# Patient Record
Sex: Male | Born: 1955 | Race: Black or African American | Hispanic: No | Marital: Married | Smoking: Never smoker
Health system: Southern US, Community
[De-identification: ages and names within clinical notes are randomized; demographics above are authoritative.]

## PROBLEM LIST (undated history)

## (undated) DIAGNOSIS — M952 Other acquired deformity of head: Secondary | ICD-10-CM

## (undated) DIAGNOSIS — I10 Essential (primary) hypertension: Principal | ICD-10-CM

## (undated) DIAGNOSIS — R569 Unspecified convulsions: Secondary | ICD-10-CM

## (undated) DIAGNOSIS — I639 Cerebral infarction, unspecified: Secondary | ICD-10-CM

## (undated) DIAGNOSIS — T884XXA Failed or difficult intubation, initial encounter: Secondary | ICD-10-CM

## (undated) DIAGNOSIS — R269 Unspecified abnormalities of gait and mobility: Secondary | ICD-10-CM

## (undated) DIAGNOSIS — M8938 Hypertrophy of bone, other site: Secondary | ICD-10-CM

## (undated) DIAGNOSIS — I169 Hypertensive crisis, unspecified: Secondary | ICD-10-CM

## (undated) DIAGNOSIS — I2510 Atherosclerotic heart disease of native coronary artery without angina pectoris: Secondary | ICD-10-CM

## (undated) DIAGNOSIS — F411 Generalized anxiety disorder: Secondary | ICD-10-CM

## (undated) DIAGNOSIS — J15211 Pneumonia due to Methicillin susceptible Staphylococcus aureus: Secondary | ICD-10-CM

## (undated) HISTORY — DX: Other acquired deformity of head: M95.2

## (undated) HISTORY — DX: Essential (primary) hypertension: I10

## (undated) HISTORY — DX: Cerebral infarction, unspecified: I63.9

## (undated) HISTORY — DX: Unspecified convulsions: R56.9

## (undated) HISTORY — DX: Pneumonia due to methicillin susceptible Staphylococcus aureus: J15.211

## (undated) HISTORY — DX: Hypertrophy of bone, other site: M89.38

## (undated) HISTORY — DX: Generalized anxiety disorder: F41.1

## (undated) HISTORY — DX: Hypertensive crisis, unspecified: I16.9

## (undated) HISTORY — PX: CARDIAC SURGERY: SHX584

## (undated) HISTORY — DX: Unspecified abnormalities of gait and mobility: R26.9

## (undated) HISTORY — PX: CORONARY STENT PLACEMENT: SHX1402

---

## 2000-02-18 ENCOUNTER — Encounter: Admission: RE | Admit: 2000-02-18 | Discharge: 2000-02-18 | Payer: Self-pay | Admitting: General Practice

## 2000-02-18 ENCOUNTER — Encounter: Payer: Self-pay | Admitting: General Practice

## 2000-02-24 ENCOUNTER — Encounter: Payer: Self-pay | Admitting: Neurosurgery

## 2000-02-24 ENCOUNTER — Ambulatory Visit (HOSPITAL_COMMUNITY): Admission: RE | Admit: 2000-02-24 | Discharge: 2000-02-24 | Payer: Self-pay | Admitting: Neurosurgery

## 2000-06-17 ENCOUNTER — Encounter: Payer: Self-pay | Admitting: Emergency Medicine

## 2000-06-17 ENCOUNTER — Inpatient Hospital Stay (HOSPITAL_COMMUNITY): Admission: EM | Admit: 2000-06-17 | Discharge: 2000-06-22 | Payer: Self-pay | Admitting: Emergency Medicine

## 2000-07-04 ENCOUNTER — Encounter (HOSPITAL_COMMUNITY): Admission: RE | Admit: 2000-07-04 | Discharge: 2000-07-15 | Payer: Self-pay | Admitting: Cardiology

## 2000-07-17 ENCOUNTER — Encounter (HOSPITAL_COMMUNITY): Admission: RE | Admit: 2000-07-17 | Discharge: 2000-10-15 | Payer: Self-pay | Admitting: Cardiology

## 2000-09-15 ENCOUNTER — Encounter: Payer: Self-pay | Admitting: Cardiology

## 2001-05-22 ENCOUNTER — Ambulatory Visit (HOSPITAL_COMMUNITY): Admission: RE | Admit: 2001-05-22 | Discharge: 2001-05-23 | Payer: Self-pay | Admitting: Cardiology

## 2004-04-09 ENCOUNTER — Ambulatory Visit (HOSPITAL_COMMUNITY): Admission: RE | Admit: 2004-04-09 | Discharge: 2004-04-09 | Payer: Self-pay

## 2004-04-19 ENCOUNTER — Inpatient Hospital Stay (HOSPITAL_COMMUNITY): Admission: AD | Admit: 2004-04-19 | Discharge: 2004-04-21 | Payer: Self-pay | Admitting: Cardiology

## 2005-01-10 DIAGNOSIS — I639 Cerebral infarction, unspecified: Secondary | ICD-10-CM

## 2005-01-10 HISTORY — DX: Cerebral infarction, unspecified: I63.9

## 2005-11-21 ENCOUNTER — Emergency Department (HOSPITAL_COMMUNITY): Admission: EM | Admit: 2005-11-21 | Discharge: 2005-11-21 | Payer: Self-pay | Admitting: Emergency Medicine

## 2005-12-05 ENCOUNTER — Encounter (INDEPENDENT_AMBULATORY_CARE_PROVIDER_SITE_OTHER): Payer: Self-pay | Admitting: Specialist

## 2005-12-05 ENCOUNTER — Ambulatory Visit (HOSPITAL_BASED_OUTPATIENT_CLINIC_OR_DEPARTMENT_OTHER): Admission: RE | Admit: 2005-12-05 | Discharge: 2005-12-05 | Payer: Self-pay | Admitting: Urology

## 2006-03-06 ENCOUNTER — Inpatient Hospital Stay (HOSPITAL_COMMUNITY): Admission: RE | Admit: 2006-03-06 | Discharge: 2006-03-10 | Payer: Self-pay | Admitting: Urology

## 2006-03-06 ENCOUNTER — Encounter (INDEPENDENT_AMBULATORY_CARE_PROVIDER_SITE_OTHER): Payer: Self-pay | Admitting: *Deleted

## 2006-03-16 ENCOUNTER — Observation Stay (HOSPITAL_COMMUNITY): Admission: EM | Admit: 2006-03-16 | Discharge: 2006-03-17 | Payer: Self-pay | Admitting: Emergency Medicine

## 2006-03-16 ENCOUNTER — Encounter: Payer: Self-pay | Admitting: Emergency Medicine

## 2006-05-01 ENCOUNTER — Ambulatory Visit (HOSPITAL_BASED_OUTPATIENT_CLINIC_OR_DEPARTMENT_OTHER): Admission: RE | Admit: 2006-05-01 | Discharge: 2006-05-01 | Payer: Self-pay | Admitting: Urology

## 2007-05-17 ENCOUNTER — Ambulatory Visit: Payer: Self-pay | Admitting: Cardiology

## 2007-05-17 ENCOUNTER — Inpatient Hospital Stay (HOSPITAL_COMMUNITY): Admission: EM | Admit: 2007-05-17 | Discharge: 2007-05-23 | Payer: Self-pay | Admitting: Emergency Medicine

## 2007-05-18 ENCOUNTER — Encounter (INDEPENDENT_AMBULATORY_CARE_PROVIDER_SITE_OTHER): Payer: Self-pay | Admitting: Pediatrics

## 2007-05-18 ENCOUNTER — Encounter (INDEPENDENT_AMBULATORY_CARE_PROVIDER_SITE_OTHER): Payer: Self-pay | Admitting: Internal Medicine

## 2007-05-22 ENCOUNTER — Ambulatory Visit: Payer: Self-pay | Admitting: Physical Medicine & Rehabilitation

## 2007-05-23 ENCOUNTER — Inpatient Hospital Stay (HOSPITAL_COMMUNITY)
Admission: RE | Admit: 2007-05-23 | Discharge: 2007-06-06 | Payer: Self-pay | Admitting: Physical Medicine & Rehabilitation

## 2007-06-07 ENCOUNTER — Encounter
Admission: RE | Admit: 2007-06-07 | Discharge: 2007-09-05 | Payer: Self-pay | Admitting: Physical Medicine & Rehabilitation

## 2007-07-06 ENCOUNTER — Encounter
Admission: RE | Admit: 2007-07-06 | Discharge: 2007-10-04 | Payer: Self-pay | Admitting: Physical Medicine & Rehabilitation

## 2007-07-10 ENCOUNTER — Ambulatory Visit: Payer: Self-pay | Admitting: Physical Medicine & Rehabilitation

## 2007-08-13 ENCOUNTER — Ambulatory Visit: Payer: Self-pay | Admitting: Physical Medicine & Rehabilitation

## 2007-09-11 ENCOUNTER — Encounter
Admission: RE | Admit: 2007-09-11 | Discharge: 2007-10-01 | Payer: Self-pay | Admitting: Physical Medicine & Rehabilitation

## 2007-09-14 ENCOUNTER — Ambulatory Visit: Payer: Self-pay | Admitting: Physical Medicine & Rehabilitation

## 2010-05-25 NOTE — Procedures (Signed)
NAMEFLEMON, KELTY                   ACCOUNT NO.:  [id number]   MEDICAL RECORD NO.:  [id number]         PATIENT TYPE:  HREC   LOCATION:                                 FACILITY:   PHYSICIAN:  Ranelle Oyster, M.D.DATE OF BIRTH:  30-Dec-1955   DATE OF PROCEDURE:  09/14/2007  DATE OF DISCHARGE:                               OPERATIVE REPORT   DESCRIPTION OF PROCEDURE:  Botox injection for spastic left hemiparesis.  Diagnostic code  342.12.   After informed consent and preparation of the skin with isopropyl  alcohol, we injected the left gastroc at both heads with 100 units of  botulinum toxin A diluted in 1 mL preservative normal saline.  Additionally, we injected the left pectoralis major with approximately  30 units in the left teres major/latissimus dorsi muscles with 70 units  of botulinum toxin A with same dilution ratio.  The areas were localized  with needle EMG to find active area.  The patient tolerated without any  type of ill effect.  He will follow-up with regular exercise of  stretching.   I will see him back in 3 months or so for followup.  He is generally  making nice progress.      Ranelle Oyster, M.D.  Electronically Signed     ZTS/MEDQ  D:  09/14/2007 16:51:22  T:  09/15/2007 05:17:32  Job:  [id number]

## 2010-05-25 NOTE — Assessment & Plan Note (Signed)
[name] is back regarding his right cortical stroke.  Therapy had contacted  me a week or two ago about spasticity in his left upper extremity.  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  He did have a fall about a week and half ago  when he slipped opening a door and fell backwards on his left arm and  back.  Shoulder pain has been worse since then.  Rates pain as 6/10,  describes it as intermittent.  It seems to be easing off a bit.  He uses  Tylenol 1000 mg twice a day for pain control with good results.  He  states as a whole that his blood pressure has been under better control  when he checks it at home.   REVIEW OF SYSTEMS:  Notable for the above.  A full 14-point review is in  the written health history section.   SOCIAL HISTORY:  The patient is married and his wife is supportive and  working.   PHYSICAL EXAMINATION:  VITAL SIGNS:  Blood pressure is 187/81, pulse is  86, and respiratory rate 18.  He is saturating 98% on room air.  GENERAL:  The patient is pleasant, alert and oriented x3.  He walks with  a shuffling gait.  He wears his left AFO for support.  EXTREMITIES:  Left upper extremity really had minimal tone.  I would say  that he had trace tone at the wrist and shoulder.  He did have some  tightness of the left pectoralis major and latissimus dorsi, but it was  minimal.  He seemed to have more pain with rotator cuff maneuvers,  particularly adduction and internal rotation today.  Tone was more  appreciable on the left lower extremity at the gastrocnemius muscle.  I  was able to range him to near neutral with somewhat of a give way right  before he reached that range.  Strength, left upper extremity is 2+ to 3-  out of 5 still throughout.  Reflexes remain hyperactive at  3+; left  lower extremity is 3+ to 4+, left knee and hip; and 1+ to 2 out of 5 at  the ankle now today.  NEUROLOGIC:  Cognitively, he is excellent.  He  continues to have minimal  dysarthria.  He has good insight, awareness, etc.  HEART:  Regular.  CHEST:  Clear.  ABDOMEN:  Soft and nontender.   ASSESSMENT:  1. 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  2. Hypertension.  3. History of renal insufficiency.  4. Left rotator cuff syndrome/capsulitis.   PLAN:  1. I feel that his left upper extremity is more due to rotator cuff      problems.  We therefore injected the left shoulder today via      lateral approach with 40 mg Kenalog and 3 mL 1% lidocaine.  The      patient tolerated well.  I think his range of motion will improve      with better pain control.  2. I do think he could benefit from Botox to the left gastrocnemius      muscle.  We will inject 200 units after approval.  3. Tylenol for pain control.  4. Recommended stretching.  5. The patient assures me blood pressure is much better controlled at      home.  I will take his word for now, but we      will continue  to follow.  6. I will see him back pending Botox schedule.      Ranelle Oyster, M.D.     ZTS/MedQ  D:  08/13/2007 12:10:30  T:  08/14/2007 02:18:10  Job #:  [id number]

## 2010-05-25 NOTE — H&P (Signed)
[name], [name]                   ACCOUNT NO.:  [id number]   MEDICAL RECORD NO.:  [id number]          PATIENT TYPE:  INP   LOCATION:  1427                         [facility]ACILITY[facility]  [facility]   PHYSICIAN:  Ranelle Oyster, M.D.DATE OF BIRTH:  February 02, 1955   DATE OF ADMISSION:  05/17/2007  DATE OF DISCHARGE:  05/23/2007                              HISTORY & PHYSICAL   CHIEF COMPLAINT:  Left-sided weakness.   HISTORY OF PRESENT ILLNESS:  This is a pleasant 55 year old African  American male with history of CAD and hypertension, admitted on May 17, 2007 with left upper extremity and lower extremity weakness.  Admission  blood pressure of 198/118.  MRI and MRA revealed small acute  nonhemorrhagic infarct in the right posterior corona radiata and right  lenticular nucleus.  MRA was without stenosis.  A 2-D echo revealed  ejection fraction 65% with small posterior pericardial fusion.  Neurology was consulted with coagulations panel negative.  Aspirin was  recommended for stroke prophylaxis.  The patient continues to have  significant left-sided weakness affecting his ability to move and  perform his self-care tasks and thus he was admitted to the inpatient  rehab today for ongoing medical and physical management.   REVIEW OF SYSTEMS:  Notable for the above.  His mood is excellent.  Denies any shortness breath or chest pain.  He is moving his bowels and  bladder.  Full reviews as in written H&P.   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   FAMILY HISTORY:  The patient's parents are in the 90s and apparently  have no obvious health problems.  He has healthy siblings.   SOCIAL HISTORY:  The patient is married, lives alone in the house, three  steps to enter.  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 He  does not smoke and occasionally drinks.  His wife is a IV tech here at  the hospital and is very supportive.  She will be taking some time off  with him upon discharge.   FUNCTIONAL HISTORY:  The patient independent prior to arrival.  Currently, he is requiring mod assist for basic mobility and self-care  tasks.   MEDICATIONS:  None.   ALLERGIES:  PENICILLIN, SEPTRA, CHLOROQUINE, ALTACE, and DAPTOMYCIN.   LABORATORY DATA:  Hemoglobin 15.7, white count 6.4, and platelets 146.  Sodium 141, potassium 0.8, BUN 24, and creatinine 1.77.  Homocystine  19.8 and hemoglobin A1c 5.8.   PHYSICAL EXAMINATION:  VITAL SIGNS:  Blood pressure 136/87, pulse 48,  respiratory 80, and temperature 98.5.  GENERAL:  The patient is pleasant, alert, and oriented x3.  She is on  edge of bed.  HEENT:  Pupils equally round and reactive to light.  Ear, nose, and  throat exam is notable for fair to good dentition and pink moist mucosa.  NECK:  Supple without JVD or lymphadenopathy.  CHEST:  Clear to auscultation bilaterally without wheezes,  rales, or  rhonchi.  HEART:  Regular rate and rhythm without murmur, rubs, or gallops.  ABDOMEN:  Soft and nontender.  Bowel sounds are positive.  SKIN:  Generally intact throughout with a few scars noted.  NEUROLOGICALLY:  Cranial nerves II-XII reveal left central VII in tongue  deviation.  Speech is intelligible and generally clear.  Reflexes are 1+  and 2+ on the left.  Sensation is 2/2 in left side today.  Judgment,  orientation, memory, and mood were all within functional limits.  EXTREMITIES:  The patient's strength in the left upper extremity is 1/5  at the deltoid trace at the biceps, triceps, 0 to trace at the finger  and wrist on the left.  Left lower extremity is 1-2/5 at the hip flexor  extenders and knee tender flexors today.  He is zero at the left ankle  dorsiflexors/plantar flexors.  Right side is 5/5 throughout.   ASSESSMENT/PLAN:  1.  Functional deficit secondary to right corona radiata and lenticular      nucleus infarct with subsequent left hemiparesis  and dysarthria.      The patient is admitted to a comprehensive inpatient rehab today to      improve upon his functional deficits and to address his medical      needs as described above.  His care cannot be provided a lesser      intensity service.  Therapy will be provided on an average of 3      hours per day on a disciplinary basis to include PT, which will      assess and treat for range of motion strengthening, gait, and      transferring appropriate equipments.  The patient will likely      benefit from a left AFO for gait and movement.  OT will assess and      treat for range of motion, strengthening, posture, and appropriate      equipment.  Rehab nurse will follow on 24-hour basis for bowel,      bladder, skin, and medication safety issues.  Speech language      pathology will assess for dysarthria.  Rehab case manager/social      worker will assess for psychosocial needs and discharge planning.      Estimated length of stay is 2-1/2 weeks.  Goals modified      independent.  Prognosis fairly good.  2. Anticoagulation with aspirin for stroke prophylaxis and subcu      Lovenox for DVT prophylaxis.  3. Hypertension:  The patient's blood pressure is better control and      Tenormin and Cozaar as well as Cardizem but likely will need      further titration in medications.  4. Renal insufficiency:  We will follow creatinine.  May need      assessment for hydronephrosis once again.  Observe urine output in      residuals.  5. Coronary artery disease:  Continue aspirin, Tenormin, Cardizem, and      niacin.      Ranelle Oyster, M.D.  Electronically Signed     ZTS/MEDQ  D:  05/23/2007  T:  05/24/2007  Job:  [id number]

## 2010-05-25 NOTE — Consult Note (Signed)
[name], [name]                   ACCOUNT NO.:  [id number]   MEDICAL RECORD NO.:  [id number]          PATIENT TYPE:  INP   LOCATION:  0111                         FACILITY:  [facility]   PHYSICIAN:  Deanna Artis. Hickling, M.D.DATE OF BIRTH:  [birth year]   DATE OF CONSULTATION:  05/17/2007  DATE OF DISCHARGE:                                 CONSULTATION   (The patient was actually born March 07, 1954.)   CHIEF COMPLAINT:  Right brain stroke, subacute,   HISTORY OF PRESENT ILLNESS:  [name] is a 55 year old married  gentleman from [place] who has lived in this country for over two  decades.  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 He is now attending medical school in  [place]. He has had significant health problems. They are quite distinct  from his other 11 siblings and his parents. Parents who are in their  late 40s and early 39s with good health.  About 5 years ago, he had a  myocardial infarction and required stent placement.  Patient has had  chronic diabetes, hypertension and they have been relatively poorly  controlled despite the fact that he is on medications under the care of  a physician.   The patient has had two prior strokes involving the left median pontine  region and the right body of the caudate.  He says that he had severe  headaches.   He says he had severe headaches 8 years ago and was seen on [address],  was probably in our practice. He was told that he had a scar on  the brain.  He had CT and MRI scan done.  I do not know what other  studies were done and what he was told.   The patient was last known normal around 9:00 p.m. when he went to bed.  He awakened around 1 in the morning and had difficulty ambulating.  He  decided to back to bed and did not present until 10:50 this morning.   The patient had a CT scan that was unremarkable.  Because he had left  hemiparesis,  MRI scan was performed and showed  evidence of acute lesion  with his apex in the caudal, the dorsal putamen and extending in a wedge  shaped fashion to the tail of the caudate on the right.  There is a  small branch occlusion of the right lenticulostriate arteries.   I was asked to see the patient to determine etiology of his stroke and  make recommendations for further workup and treatment.   The patient's risk factors have been already enumerated above.  The  patient has felt generally well.  A 12 system review is negative except  as noted above.   PAST MEDICAL HISTORY:  1. Dyslipidemia (she is currently not taking medications for that).  2. Myocardial infarction.  3. Renal insufficiency.  4. Hypogonadism.  5. He had some form of scar tissue over his kidney that required a      surgical procedure by Dr. Brunilda Payor.   PAST  MEDICAL HISTORY:  1. His only surgical procedures include a laparotomy to deal with the      scar tissue in the kidney.  2. Cardiac stent.  3. He also had a graft for some form of ulcer ulcerative lesion that      did not take well.   SOCIAL HISTORY:  The patient is married.  He lives in [place], but  has been spending at 2 months at a time in [place], this is his first  year of medical school.  He does not use tobacco or drugs.  He drinks  infrequently in a social fashion.   CURRENT MEDICATIONS:  1. Procardia 60 mg daily.  2. Aspirin 81 mg daily.  3. Atenolol 25 mg daily.  4. Cozaar 100 mg daily.   DRUG ALLERGIES:  PENICILLIN, SEPTRA, STREPTOMYCIN.   PHYSICAL EXAMINATION:  Today, the patient is a well-developed, well-  nourished right-handed man in no distress.  Blood pressure initially  198/118. He was treated with antihypertensives, I think labetalol and  blood pressure dropped to the 148/92 range and is currently 184/92.  Resting pulse 57, respirations 18, temperature 97.7, oxygen saturation  97%.  EAR, NOSE AND THROAT:  No infections.  No bruits.  LUNGS:  Clear.  HEART:  No  murmurs.  Pulses normal.  ABDOMEN:  Soft.  Bowel sounds normal.  No hepatosplenomegaly.  EXTREMITIES:  Were normal.  He has a graft on his left shin.  NEUROLOGIC EXAMINATION:  Awake, alert without dysphasia, dyspraxia.  CRANIAL NERVES:  Round reactive pupils.  Visual fields full. Extraocular  movements full and symmetric. He has symmetric facial strength, midline  tongue and uvula.  Air conduction greater than bone conduction  bilaterally.  MOTOR EXAMINATION:  Normal strength except for his left hip flexor which  was 4+.  Fine motor movements were good.  There is no drift.  Sensation  mild peripheral polyneuropathy, good stereognosis.  No hemisensory.  Cerebellar examination with good finger-to-nose, rapid alternating  movements.  GAIT:  Left hemiparetic, deep reflexes were absent.  The patient had  bilateral flexor plantar responses.   IMPRESSION:  1. Acute right basal ganglia infarction involving the putamen and tail      of the caudate. This a small branch occlusion of lenticulostriate.      I believe that is thrombotic, not embolic- 434.01.  2. Two remote strokes involving the right caudate and left paramedian      pontine region.  Also, lacunes.  Risk factors as noted above      including diabetes, hypertension, atherosclerotic cardiovascular      disease and dyslipidemia.   PLAN:  The patient will have morning labs including hemoglobin A1c,  fasting lipid panel, serum homocystine. Should have a coagulation  profile looking at protein S, total and functional protein C total and  functional and antithrombin III G2A 20210 prothrombin mutation  anticardiolipin antibody, factor V Leiden mutation, fasting lipid panel  and lupus anticoagulant.  He also needs to have noninvasive testing, 2-D  echocardiogram, carotid Doppler.  I agree with his treatment that will  consist of aspirin, his antihypertensives medicines and he probably  should have a statin as well.  He is going to have to  decide whether or  not he should return to [place] or remain in this country her care.  He  was so as to leave tomorrow for [place].  This been postponed. With his  ongoing problems with hypertension, he needs careful management and I do  not know if that is going to happen in [place].   I appreciate the opportunity to participate in his care.  He will need  some physical therapy for his gait because he is walking with a limp,  but I do not believe he needs any other therapy at this time. He also  needs a swallow screen to comply with joint commission requirements.  I  appreciate the opportunity.      Deanna Artis. Sharene Skeans, M.D.  Electronically Signed     WHH/MEDQ  D:  05/17/2007  T:  05/17/2007  Job:  [id number]

## 2010-05-25 NOTE — Discharge Summary (Signed)
NAMEBERTRAM, [name]                   ACCOUNT NO.:  [id number]   MEDICAL RECORD NO.:  [id number]          PATIENT TYPE:  IPS   LOCATION:  4009                         FACILITY:  [facility]   PHYSICIAN:  Ranelle Oyster, M.D.DATE OF BIRTH:  09/16/1955   DATE OF ADMISSION:  05/23/2007  DATE OF DISCHARGE:  06/06/2007                               DISCHARGE SUMMARY   DISCHARGE DIAGNOSES:  1. Right cerebrovascular accident with left hemiparesis.  2. Hypertension, better controlled.  3. Renal insufficiency.  4. Bradycardia, asymptomatic.   HISTORY OF PRESENT ILLNESS:  [name] is a 55 year old male with  history of coronary artery disease, hypertension admitted May 17, 2007  with an history of left upper extremity, left lower extremity weakness.  BP at 198/118 at admission.  MRI, MRA of brain showed small acute non  hemorrhagic infarct, posterior right corona radiata to right lenticular  nucleus, mild intracranial atherosclerosis.  MRA of neck and carotids  showed no ICA stenosis.  2D echo showed EF of 65% with small posterior  pericardial effusion.  Neuro was consulted for input and coagulopathy  panel was recommended, this was negative.  Aspirin has been recommended  for CVA prophylaxis.  Currently, the patient continues with left upper  extremity greater than left lower extremity weakness as well as facial  weakness and mild dysarthria.  Therapies are ongoing and the patient is  mod assist for standing balance, able to transfer with increased  weightbearing to left lower extremity.  Rehab consult for further  therapies.   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   ALLERGIES:  PENICILLIN, SEPTRA, STREPTOMYCIN, CHLOROQUINE, and ALTACE.   FAMILY HISTORY:   Negative for any illnesses.   SOCIAL HISTORY:  The patient is married, lives in one-level home with  three steps at entry.  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  Does not use any tobacco, has an occasional  glass of wine.  The patient was independent and was planning on resuming  school prior to CVA.   HOSPITAL COURSE:  [name] was admitted to rehab on May 23, 2007.  The patient had therapies to consist of PT, OT and speech therapy past  admission.  Labs done revealed hemoglobin 14.8, hematocrit 45.1, white  count 4.4, and platelets 168.  Check of lytes revealed sodium 143,  potassium 3.8, chloride 107, CO2 21, BUN at 30 and creatinine 1.83.  The  patient on no new medicines that would be causing any worsening of renal  insufficiency, therefore renal ultrasound was done to rule out  hydronephrosis. The patient with history of worsening renal status in  the past due to this.  Renal ultrasound showed no evidence of  hydronephrosis.  The patient's renal status has been followed along.  He  was encouraged to push p.o. fluids.  The routine checks of lytes done  during  this stay, last on Jun 04, 2007 shows improvement with BUN at 23,  and creatinine at 1.7.  Management of the patient's blood pressure has  been an issue.  Medications have been adjusted.  Catapres was added for  BP control; however, the patient developed a diffuse rash due to this.  Catapres was discontinued with resolution of rash.  Imdur was added  nightly with current blood pressures ranging in 130s-140s systolic, 60s  to 70s diastolic.  Heart rate continues to range from 51-57 range.  The  patient's p.o. intake has been good.  He has been continent of bowel and  bladder.  Last weight is at 75 kg.   The patient continues with left hemiparesis, noted to have improvement  in proximal left upper extremity strength.  Mood has been stable.  He  did report some concerns regarding  lifestyle changes and Dr. Leonides Cave,  neuropsych was consulted for input and followup on adjustment reaction.   During his stay in rehab, [name] has progressed along well.  OT has  been working with the patient in incorporation of left upper extremity  for grooming and dressing as well as Adaptic dressing techniques.  Currently, the patient is able to weight shift on to left lower  extremity and use right upper extremity grossly during grooming tasks.  The patient is at close supervision for static standing balance.  He is  min assist for shower transfers.  Speech therapy has been working with  the patient for mobility.  The patient has an excellent progress  progressing to modified independent for transfer.  Requires min assist  with gait to ambulate 200 feet with AFO and walker.  His  motor control  limits hip, knee flexion in a swing which tends to increased toe catch  despite AFO.  Another strip was added to help with his foot clearance.  The speech therapy has worked with the patient with strategies for mild  dysarthria as well as oro-motor exercises.  Family is to provide 24-hour  supervision past discharge.  The patient to continue with progressive  PT, OT, speech therapy past discharge.  First appointment is set up at  [facility] outpatient rehab to begin on Jun 07, 2007.  On Jun 06, 2007,  the patient is discharged to home.   DISCHARGE MEDICATIONS:  1. Cozaar 100 mg per day.  2. Ecotrin 325 mg a day.  3. Foltx 1 per day.  4. Imdur 60 mg nightly.  5. Niacin 100 mg a day.  6. Atenolol 25 mg per day.  7. Cardizem CD 120 mg a day.  8. Senokot-S one p.o. nightly.   DIET:  Heart healthy.   ACTIVITY LEVEL:  At supervision to intermittent supervision.  No  strenuous activity.  No alcohol, no smoking, and no driving.   FOLLOWUP:  The patient to followup with Dr. Riley Kill on July 10, 2007 at  12:20, followup with Dr. Catha Gosselin and Dr. Sharyn Lull for routine checks.      Greg Cutter, P.A.      Ranelle Oyster, M.D.  Electronically Signed    PP/MEDQ  D:  06/06/2007  T:  06/07/2007  Job:  [id number]   cc:   Eduardo Osier. Sharyn Lull, M.D.  Deanna Artis. Sharene Skeans, M.D.  Dr. Catha Gosselin

## 2010-05-25 NOTE — H&P (Signed)
[name], [name]                   ACCOUNT NO.:  [id number]   MEDICAL RECORD NO.:  [id number]          PATIENT TYPE:  EMS   LOCATION:  ED                           FACILITY:  [facility]   PHYSICIAN:  Herbie Saxon, MDDATE OF BIRTH:  [birth year]   DATE OF ADMISSION:  05/17/2007  DATE OF DISCHARGE:                              HISTORY & PHYSICAL   PRIMARY CARE PHYSICIAN:  Dr. Maude Leriche.   CARDIOLOGIST:  Dr. Sharyn Lull.   CODE STATUS:  Full code.  No designated health care power of attorney.   PRESENTING COMPLAINT:  Left arm weakness, left leg weakness, one day.   HISTORY OF PRESENTING COMPLAINT:  This is a 55 year old African-American  male with a history of coronary artery disease status post stent three  years ago, hyperlipidemia, hypertension, hypogonadism, renal  insufficiency, who was quite well until he slept last night.  He woke up  around 1:00 a.m. with left arm and left leg weakness.  Wife also noticed  some slight right facial asymmetry.  The patient had initial difficulty  walking, left leg was dragging.  He also had lightheadedness.  He denies  any chest pain, palpitation, shortness of breath, cough or seizure  activity.  No loss of consciousness.  The patient's symptoms have mostly  improved since he has arrived at the emergency room at [facility].  On  presentation, his blood pressure was severely elevated at 198/118;  however, the patient claims good medication compliance.  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  He denies any symptoms referable to the genitourinary,  gastrointestinal, psychological, musculoskeletal or integumentary/skin  systems.  The patient is anxious to be discharged as soon as possible.  It was also noticed that he had morbidly elevated glucose.  He has not  been diagnosed as a diabetic prior to this.   PAST MEDICAL HISTORY:  Also includes:  1. Hypogonadism.  2. He had a laparotomy  two years ago for right kidney fibrous tissue.   FAMILY HISTORY:  Noncontributory.   SOCIAL HISTORY:  He is married and has four children.  No history of  tobacco or drug abuse.  Socially drinks alcohol, a glass of wine on  occasion.   MEDICATIONS:  1. Procardia 60 mg daily.  2. Aspirin 81 mg daily.  3. Atenolol 25 mg twice daily.  4. Cozaar 100 mg daily.   ALLERGIES:  1. PENICILLIN.  2. SEPTRA.  3. DAPTOMYCIN.  4. CHLOROQUINE.   REVIEW OF SYSTEMS:  Fourteen systems were reviewed but none positive  other than in history of presenting complaint.   PHYSICAL EXAMINATION:  GENERAL:  He is a middle-aged man.  He is not in  acute respiratory distress.  VITAL SIGNS:  Temperature is 98, pulse is 56, respiratory rate is 24,  blood pressure 148/92.  NEUROLOGIC:  Pupils are equal, reactive to light and accommodation.  Normal visual acuity, normal visual fields.  Cranial nerves II-XII  grossly intact.  There is no facial asymmetry.  There is no dysarthria.  There is no pronator drift.  Power is 5/5 in the right upper and lower  extremity, 4+ left upper and left lower extremity.  Mild ataxic gait.  There is no dysdiadochokinesia.  There is no aphasia.  Sensory system is  normal sensation to pinprick, light touch and proprioception.  Deep  tendon reflexes are 2+ globally.  Babinski bilaterally.  Plantar  flexion.  CHEST:  Clinically clear.  HEART:  Heart sounds 1 and 2, regular rate and rhythm.  ABDOMEN:  Soft and nontender.  No organomegaly.  Old left flank scar.  Bowel sounds are normoactive and inguinal orifices are patent.  PSYCHIATRIC:  He is alert and oriented to time, place and person.  Mood  is stable.  Memory is intact.  No seizure activity noted.   AVAILABLE LABORATORY:  Showed a WBC of 6, hematocrit 47, platelet count  146,000, glucose is 153, sodium 138, potassium 4.3, chloride 103,  bicarbonate 26, BUN 16, creatinine 1.6, PTT 29, PT 11.8, INR 1.9.  Urinalysis is negative.   CT of the brain shows no acute infarct.  MRI of  the brain shows acute infarct in the posterior right corona radiata  extending to the lenticular nucleus.  EKG shows normal sinus rhythm at  60 per minute, left axis deviation, left atrial enlargement, right axis  deviation and left ventricular hypertrophy.   ASSESSMENT:  1. Acute right cerebral CVA (cerebrovascular accident).  2. Hypertension and emerging hypertensive heart disease.  3. Mild bradycardia.  4. New-onset diabetes.  5. Chronic renal insufficiency.  6. Anxiety.  7. Hypogonadism.  8. 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  9. History of coronary artery disease status post stent.  10.Hyperlipidemia.   The patient is to be admitted to the neurologic intensive care unit.  Dr. Sharene Skeans, neurologist, consulted.  He is to be on intravenous fluid  normal saline at 40 mL an hour.  Diet will be heart healthy, 1800-  calorie ADA (American Diabetic Association), low cholesterol.  Activity  bedrest with seizure, fall and aspiration precautions.  We will get  speech therapy, physical therapy and occupational therapy intervention,  evaluation and assistance.  Oxygen 2 to 4 liters via nasal cannula to  keep saturations above 90.  Orthostatic blood pressure q.a.m.  Get  serial cardiac enzymes, EKG (electrocardiogram) q.8 hours x3.  Obtain  his LFTs (liver function tests), ESR (erythrocyte sedimentation rate),  lupus anticoagulant, RPR (Rapid Plasma Reagin), protein C, protein S  assay.  Obtain MRA (magnetic resonance angiography) head and neck.  Hemoccult x2.  Check his hemoglobin A1c, homocysteine, thyroid function  and fasting lipids.  Lovenox 40 mg subcutaneously daily for DVT (deep  venous thrombosis) prophylaxis, _______ aspirin 325 mg daily, Ambien 5  mg nightly for insomnia, Protonix 40 mg IV daily, albuterol and Atrovent  1 unit dose q.6 hours p.r.n., sliding scale insulin coverage with  NovoLog, _______,  Lantus insulin 4 units subcutaneously nightly.  Continue with his home medications of Procardia 60 mg daily, atenolol 25  mg b.i.d., Cozaar 100 mg daily.  Hold if blood pressure is less than  120/80.  Clonidine 0.1 mg q.8 hours p.r.n. blood pressure greater than  160/110, Xanax 0.25 mg b.i.d.   The patient's illness, medication, treatment plan were explained to him  and his family.  They verbalized understanding.   CONDITION:  Fair.   PROGNOSIS:  Guarded.   TIME OF ENCOUNTER:  One hour.      Herbie Saxon, MD  Electronically Signed     MIO/MEDQ  D:  05/17/2007  T:  05/17/2007  Job:  [id number]

## 2010-05-25 NOTE — Discharge Summary (Signed)
[name], [name]                   ACCOUNT NO.:  [id number]   MEDICAL RECORD NO.:  [id number]          PATIENT TYPE:  INP   LOCATION:  1427                         FACILITY:  [facility]   PHYSICIAN:  Herbie Saxon, MDDATE OF BIRTH:  07/10/1955   DATE OF ADMISSION:  05/17/2007  DATE OF DISCHARGE:                               DISCHARGE SUMMARY   INTERIM SUMMARY   CONSULTATIONS:  Deanna Artis. Sharene Skeans, M.D., neurology.   PRIMARY CARE PHYSICIAN:   CARDIOLOGIST:  Mohan N. Sharyn Lull, M.D.   RADIOLOGY:  The CT head of May 17, 2007 shows no clear evidence of acute  infarction.  MRI bran of May 17, 2007 shows acute small nonhemorrhagic  infarct posterior right corona radiata extending into the posterior  superior aspect of the right lenticular nucleus.  MRA head and neck May 17, 2007 shows intracranial arteriosclerotic changes, precavernous  narrowing of the right internal carotid artery.  Mild narrowing of the  proximal vertebral arteries bilaterally.  Repeat CT scan head of May 21, 2007 shows acute infarction involving the right lateral periventricular  deep white matter, remote right posterior frontal infarction, no  hemorrhage.   HOSPITAL COURSE:  This 55 year old African-American male presented with  left arm weakness, left leg weakness of one day duration with light  headedness, no seizure or syncope.  He is status post MI with stent  placed three years prior.  He has a history hyperlipidemia,  hypertension, renal insufficiency, hypogonadism.  On presentation he had  left hemiparesis.  His blood pressure was mildly elevated at 148/92.  The patient was having difficulty ambulating and had been complaining of  previous headaches.  Dr. Sharene Skeans, neurologist was consulted and agreed  with the diagnosis of acute right basal ganglia cerebrovascular accident  which is thrombotic and recommended coagulopathy work up, counseled the  patient on the need to postpone going back to medical  school in [place]  for now.  The patient was started on gentle blood pressure control, IV  fluid hydration, renal scale sliding scale insulin, NovoLog coverage for  his borderline hyperglycemia.  The patient was not exactly compliant  with medical plans.  He was initially refusing Accu-Chek's and insulin  treatment.  However, he has been counseled extensively on the need for  tight control of glucose and monitoring of his renal status.  His lipid  panel did show  he has hypertriglyceridemia. He has been started on low  dose Niacin.  His rhabdomyolysis is improving with IV fluid hydration  and creatinine has dropped down from 1.9 to 1.7.  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  His  2-D  echocardiogram shows ejection fraction of 65% with mild pericardial  effusion.  The patient has been started on gentle physical therapy,  however, it was noticed that his left upper extremity paralysis is not  improving and there is dense HEMIPLEGIA in the left lower extremity  POWER<3/5.  His hemoglobin A1C, however, is 5.8.   DISCHARGE MEDICATIONS:  Discharge medications will be dictated on the  final  discharge.   PHYSICAL EXAMINATION:  GENERAL:  On examination today he is a middle-  aged man that is in no acute distress.  VITAL SIGNS:  Temperature 98, pulse 51, respiratory rate 20, blood  pressure 144/87.  HEENT: Pupils equal, round, reactive to light and accommodation.  He has  left facial palsy.  Oropharynx and nasopharynx are clear.  Mucous  membranes are moist.  NECK:  Supple.  No carotid bruit or thyromegaly.  No elevated JVD.  CHEST:  Is clinically clear.  HEART:  S1 and S2, regular rate and rhythm.  ABDOMEN: Benign.  Inguinal orifices are patent.  MUSCULOSKELETAL:  Power is 1/5 in the left upper extremity, 3/5 in the  left lower extremity, 5/5 right upper extremity and right lower  extremity.  Deep tendon reflexes are reduced, more marked in the  left  upper extremity with left hypotonia.  Babinski is upgoing left foot with  cranial nerves, except for cranial nerves VII, intact.   LABORATORY DATA:  The available laboratory work today shows a BUN of 24,  creatinine is 1.7.  Sodium 141, potassium 3.8, chloride 109, glucose  104, bicarbonate 24.  Factor V Leiden is negative.  Protein-C function  elevated at 177.  Homocysteine is 19.8. White blood cells 6, hematocrit  14, platelet count 167,000.  Lupus anticoagulant is negative.   RECOMMENDATIONS:  After neurological clearance, the patient is to be  discharged skilled nursing facility rehabilitation in the next 48 to 72  hours, following neurology clearance.  His __________ control is  improved but hold beta blocker if heart rate is less than 55 per minute.      Herbie Saxon, MD  Electronically Signed     MIO/MEDQ  D:  05/22/2007  T:  05/22/2007  Job:  [id number]

## 2010-05-25 NOTE — Assessment & Plan Note (Signed)
[name] is back regarding his right cortical stroke.  Therapy had contacted  me a week or two ago about spasticity in his left upper extremity.  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  He did have a fall about a week and half ago  when he slipped opening a door and fell backwards on his left arm and  back.  Shoulder pain has been worse since then.  Rates pain as 6/10,  describes it as intermittent.  It seems to be easing off a bit.  He uses  Tylenol 1000 mg twice a day for pain control with good results.  He  states as a whole that his blood pressure has been under better control  when he checks it at home.   REVIEW OF SYSTEMS:  Notable for the above.  A full 14-point review is in  the written health history section.   SOCIAL HISTORY:  The patient is married and his wife is supportive and  working.   PHYSICAL EXAMINATION:  VITAL SIGNS:  Blood pressure is 187/81, pulse is  86, and respiratory rate 18.  He is saturating 98% on room air.  GENERAL:  The patient is pleasant, alert and oriented x3.  He walks with  a shuffling gait.  He wears his left AFO for support.  EXTREMITIES:  Left upper extremity really had minimal tone.  I would say  that he had trace tone at the wrist and shoulder.  He did have some  tightness of the left pectoralis major and latissimus dorsi, but it was  minimal.  He seemed to have more pain with rotator cuff maneuvers,  particularly adduction and internal rotation today.  Tone was more  appreciable on the left lower extremity at the gastrocnemius muscle.  I  was able to range him to near neutral with somewhat of a give way right  before he reached that range.  Strength, left upper extremity is 2+ to 3-  out of 5 still throughout.  Reflexes remain hyperactive at  3+; left  lower extremity is 3+ to 4+, left knee and hip; and 1+ to 2 out of 5 at  the ankle now today.  NEUROLOGIC:  Cognitively, he is excellent.  He  continues to have minimal  dysarthria.  He has good insight, awareness, etc.  HEART:  Regular.  CHEST:  Clear.  ABDOMEN:  Soft and nontender.   ASSESSMENT:  1. 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  2. Hypertension.  3. History of renal insufficiency.  4. Left rotator cuff syndrome/capsulitis.   PLAN:  1. I feel that his left upper extremity is more due to rotator cuff      problems.  We therefore injected the left shoulder today via      lateral approach with 40 mg Kenalog and 3 mL 1% lidocaine.  The      patient tolerated well.  I think his range of motion will improve      with better pain control.  2. I do think he could benefit from Botox to the left gastrocnemius      muscle.  We will inject 200 units after approval.  3. Tylenol for pain control.  4. Recommended stretching.  5. The patient assures me blood pressure is much better controlled at      home.  I will take his word for now, but we      will continue  to follow.  6. I will see him back pending Botox schedule.      Ranelle Oyster, M.D.  Electronically Signed     ZTS/MedQ  D:  08/13/2007 12:10:30  T:  08/14/2007 02:18:10  Job #:  [id number]

## 2010-05-25 NOTE — Assessment & Plan Note (Signed)
[name] is back regarding his right cortical stroke.  His stroke  involved the right corona radiata and right lenticular nucleus as well.  [name] has been doing quite well.  He is in outpatient therapy.  His  main complaint is left shoulder pain and continued difficulties using  the left arm.  He reports pain in the shoulder with most range of motion  activities.  He is on outpatient therapy and he is pleased with his  progress.  They are using a BIONEST treatments for the arm and leg.  His  leg seems to be functioning fairly well with his brace.  He experiences  no breakdown.  He is independent with his walking using the AFO.  His  blood pressure has been under better control as of late.  He follows up  with his PCP for this.  He also sees Dr. Sharyn Lull next month.  His  appetite has been good.  He moves his bowel and bladder well.  He is  independent with his bathing, dressing, and mobility.   REVIEW OF SYSTEMS:  Notable for the above.  Also reports some numbness  in the left thumb.   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   PHYSICAL EXAMINATION:  VITAL SIGNS:  Blood pressure is 179/77, pulse 54,  and respiratory rate 18.  He is sating 99% on room air.  GENERAL:  The patient is pleasant, alert, and oriented x3.  The affect  is bright and appropriate.  He walks with a limp using his brace and straight cane.  He does  compensate very well.  The left upper extremity was examined today.  He  has really good muscle strength at 2+ to 3-/5 throughout.  He does have  some apraxia and occasional tone in the wrist, fingers, elbow, and  shoulder.  The reflexes are 3+ throughout.  His shoulder is notable for  rotator cuff impingement signs and mild capsulitis.  Left lower  extremity tone is trace except for the ankle which is perhaps 1/4 with  mild contracture noted.  Strength is 3+ to 4+ left knee and hip today.  Ankle  movement is 1/5.  Cognitively, he is excellent.  He has mild  dysarthria but good mentation, awareness, etc.  HEART:  Regular.  CHEST:  Clear.  ABDOMEN:  Soft, nontender.  He appears to be in good shape physically.   ASSESSMENT:  1. 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  2. Hypertension.  3. History of renal insufficiency.  4. Left rotator cuff syndrome/capsulitis secondary to #1.   PLAN:  1. Continue his outpatient therapies.  He is a great BIONEST candidate      and should continue to improve nicely.  His fluidity of movement      should improve as well with repetition.  2. Left shoulder should improve with range of motion.  3. Recommended Tylenol for now as well for pain.  If he needs      something more, he should call.  4. I think he could benefit from baclofen to help control spasticity.      In general, it is very mild and should be treated with oral      medication and tone management.  5. Blood pressure is improving.  Continue monitoring per PCP.  6. I will see him back in about 6 weeks' time.      Earna Coder  Ermalene Postin, M.D.  Electronically Signed    ZTS/MedQ  D:  07/10/2007 13:15:08  T:  07/11/2007 07:29:51  Job #:  [id number]

## 2010-05-28 NOTE — Op Note (Signed)
[name], [name]                   ACCOUNT NO.:  [id number]   MEDICAL RECORD NO.:  [id number]          PATIENT TYPE:  AMB   LOCATION:  [facility]                         [facility]ACILITY[facility]   PHYSICIAN:  Lindaann Slough, M.D.  DATE OF BIRTH:  Jul 21, 1955   DATE OF PROCEDURE:  05/01/2006  DATE OF DISCHARGE:                               OPERATIVE REPORT   PREOPERATIVE DIAGNOSIS:  Status post ureterolysis,  intraperitonealization of the right ureter and  repair of ureteral  injury on March 06, 2006.   POSTOPERATIVE DIAGNOSIS:  Status post ureterolysis,  intraperitonealization of the right ureter and repair of ureteral injury  on March 06, 2006.   PROCEDURE DONE:  Cystoscopy, right retrograde pyelogram, and removal of  double-J catheter.   SURGEON:  Dr. Brunilda Payor.   ANESTHESIA:  General.   DATE OF PROCEDURE:  May 01, 2006.   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  The patient is now scheduled for cystoscopy,  retrograde pyelogram, and removal of the double-J catheter if the ureter  is normal.   DESCRIPTION OF PROCEDURE:  Under general anesthesia the patient was  prepped and draped and placed in the dorsal lithotomy position. A #22  cystoscope could not be passed through the meatus.  I dilated the meatus  with Sissy Hoff sounds, up to #24-French. Then the 22 cystoscope was  passed in the bladder without difficulty.  The anterior urethra is  normal. He has moderate prostatic hypertrophy.  The bladder is normal.  There is a double-J catheter coming out of the right ureteral orifice  with multiple encrustations on the end of the double-J catheter.  There  is no tumor in the bladder.  The end of the double-J catheter was  grasped with a grasping forceps and pulled through the urethra.  A  sensor  tip guidewire was then passed through the double-J catheter and  the double-J catheter was removed.   Retrograde pyelogram.  A cone-tip catheter was then passed through the cystoscope into the  right ureteral orifice.  Contrast was then injected through the cone-tip  catheter.  The ureter appears normal.  There is no evidence of  extravasation of contrast and there is no evidence of hydroureter, no  evidence of a ureteral stricture.  The calices are moderately dilated.  The cone-tip catheter was then removed.  An open-ended catheter was  passed over the guidewire and the guidewire was removed.  Contrast was  again injected through the open-ended catheter and again there is no  evidence of extravasation of contrast from the ureter and there is no  evidence of stricture.  Urine from the renal pelvis was then obtained  for culture.  Then the open-ended catheter was removed.   The bladder was then irrigated with normal saline and all encrustations  from the ureteral catheter were irrigated out of the bladder.  The  bladder was then emptied and the cystoscope  removed.   The patient tolerated the procedure well and left the OR in satisfactory  condition to post anesthesia care unit.      Lindaann Slough, M.D.  Electronically Signed     MN/MEDQ  D:  05/01/2006  T:  05/01/2006  Job:  [id number]   cc:   Eduardo Osier. Sharyn Lull, M.D.  Fax: [phone]

## 2010-05-28 NOTE — Cardiovascular Report (Signed)
[place] [employer]  Patient:    [name], [name] Visit Number: [id number] MRN: [id number]          Service Type: CAT Location: [id number] Attending Physician:  Robynn Pane Dictated by:   Eduardo Osier Sharyn Lull, M.D. Proc. Date: 05/22/01 Admit Date:  05/22/2001 Discharge Date: 05/23/2001   CC:         Cardiac Catheterization Laboratory  Osvaldo Shipper. Spruill, M.D.   Cardiac Catheterization  PROCEDURES: 1. Left cardiac catheterization with selective left and right coronary    angiography, left ventriculography via right groin using Judkins technique. 2. Successful percutaneous transluminal coronary angioplasty to mid    right coronary artery using 2.5 x 10 mm, long, CrossSail balloon. 3. Successful deployment of 2.5 x 8 mm, long, Cypher drug-eluting stent in    mid right coronary artery.  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 States chest pain relieved with rest. Denies any palpitations, lightheadedness, or syncope. Denies PND, orthopnea, leg swelling, complains of occasional muscle cramps, denies rest or nocturnal angina, and denies fever, chills, or cough. States chest pain feels similar when he had MI.  PAST MEDICAL HISTORY: As above.  PAST SURGICAL HISTORY: None.  ALLERGIES: He is allergic to multiple medications; SULFA, PENICILLIN, STREPTOMYCIN, questionable NORVASC, and questionable ______.  MEDICATIONS AT HOME: 1. Atenolol 25 mg p.o. q.d. 2. Enteric-coated aspirin 325 mg p.o. q.d. 3. Plavix 75 mg p.o. q.d. 4. Altace was recently stopped by the patient. 5. Cozaar 50 mg p.o. q.d. 6. Imdur 60 mg p.o. q.a.m. 7. Zocor 40 mg half p.o.  q.h.s. which was discontinued because of    myalgias.  SOCIAL HISTORY: He is married and has five children. Drinks wine and gin occasionally. No history of smoking. Born in [place], lives in [place] for 19+ years. Works as ______.  FAMILY HISTORY: Noncontributory.  PHYSICAL EXAMINATION:  GENERAL: On examination, he is alert, awake, oriented x3.  VITAL SIGNS: Blood pressure was 170/100, pulse was 62 regular.  HEENT: Conjunctiva pink.  NECK: Supple, no JVD, no bruit.  LUNGS: Lungs are clear to auscultation without rhonchi or rales.  CARDIOVASCULAR: S1 and S2 were normal. There was S4 gallop.  ABDOMEN: Soft, bowel sounds are present, nontender.  EXTREMITIES: There was no clubbing, cyanosis or edema.  IMPRESSION: Accelerated angina, rule out myocardial infarction, uncontrolled hypertension, hypercholesterolemia, myalgias, coronary artery disease, history of non-Q-wave myocardial infarction, status post percutaneous transluminal coronary angioplasty stenting to obtuse marginal #2.  Discussed with patient regarding various options of treatment, i.e., medical and noninvasive stress testing versus invasive, left catheterization, possible percutaneous transluminal coronary angioplasty, stenting, its risk, i.e., death, MI, stroke, need for emergency CABG, risk of restenosis, local vascular complications, etc., and consented for PCI.  DESCRIPTION OF PROCEDURE: After obtaining the informed consent, the patient was brought to the catheterization lab and was placed on the fluoroscopy table.  The right groin was prepped and draped in the usual fashion. Xylocaine 2% was used for local anesthesia in the right groin. With the help of a thin-walled needle, a 6 French arterial sheath was placed. The sheath was aspirated and flushed.  Next, a 6 French left Judkins catheter was advanced over the wire under fluoroscopic guidance up to the ascending  aorta. The wire was pulled out, the  catheter was aspirated and connected to the manifold. The catheter was further advanced and engaged into left coronary ostium. Multiple views of the left system were taken. Next, the catheter was disengaged and was pulled out over the wire and was replaced with a 6 French right Judkins catheter, which was advanced over the wire under fluoroscopic guidance up to the ascending aorta. The wire was pulled out, the catheter was aspirated and connected to the manifold. The catheter was further advanced and engaged into the right coronary ostium. Multiple views of the right system were taken. Next, the catheter was disengaged and was pulled out over the wire and was replaced with a 6 French pigtail catheter, which was advanced over the wire under fluoroscopic guidance up to the ascending aorta. The wire was pulled out, the catheter was aspirated and connected to the manifold. The catheter was further advanced across the aortic valve into the LV. LV pressures were recorded. Next, left ventriculography was done in 30-degree RAO position. Post angiographic pressures were recorded from LV and then pullback pressures were recorded from aorta. There was no gradient across the aortic valve. Next, the pigtail was pulled out over the wire, sheaths were aspirated and flushed.  FINDINGS: LV showed good left ventricular systolic function. EF was 50-55%. Left main was patent.  LAD has 40-50% mid sequential stenosis. Diagonal #1 was patent. Diagonal #2 has 30-40% proximal stenosis.  Left circumflex was patent. OM-1 was very small, which was less than 0.5 mm which was diffusely diseased. OM-2 has 20-30% proximal stenosis. Stented portion has mild in-stent re-stenosis, and distally the vessel is diffusely diseased. OM-3 is very small which is diffusely diseased. OM-4 has 30-40% mid stenosis.   RCA has 30-40% proximal stenosis, 80% focal mid stenosis just before the large acute marginal which has also  80-85% stenosis which is less than 1 mm in size. Distal midportion of the RCA also has 40-50% stenosis beyond the origin of the acute marginal.  INTERVENTIONAL PROCEDURE: Successful PTCA to mid RCA was done using 2.5 x 10 mm, long, CrossSail balloon for pre-dilatation and then 3.5 x 8 mm, long, Cypher drug-eluting stent was deployed at 8 atmospheres of pressure, which was fully expanded going up to 13 atmospheres of pressure. The lesion was dilated from 80% to 0% residual with excellent TIMI grade 3 distal flow without evidence of dissection or distal embolization. The patient received weight-based heparin, ReoPro during the procedure. The patient tolerated the procedure well. There were no complications. The patient was transferred to recovery room in stable condition. Dictated by:   Eduardo Osier Sharyn Lull, M.D. Attending Physician:  Robynn Pane DD:  05/23/01 TD:  05/24/01 Job: [id number]

## 2010-10-06 LAB — COMPREHENSIVE METABOLIC PANEL
Albumin: 3.4 — ABNORMAL LOW
BUN: 30 — ABNORMAL HIGH
Calcium: 9.3
Creatinine, Ser: 1.83 — ABNORMAL HIGH
Total Bilirubin: 0.5
Total Protein: 6.9

## 2010-10-06 LAB — CK TOTAL AND CKMB (NOT AT ARMC)
Relative Index: 0.9
Total CK: 279 — ABNORMAL HIGH

## 2010-10-06 LAB — URINALYSIS, ROUTINE W REFLEX MICROSCOPIC
Bilirubin Urine: NEGATIVE
Glucose, UA: NEGATIVE
Ketones, ur: NEGATIVE
Ketones, ur: NEGATIVE
Leukocytes, UA: NEGATIVE
Nitrite: NEGATIVE
Nitrite: NEGATIVE
Protein, ur: 100 — AB
Protein, ur: NEGATIVE
Specific Gravity, Urine: 1.007
Urobilinogen, UA: 0.2
Urobilinogen, UA: 0.2
pH: 6
pH: 5

## 2010-10-06 LAB — CBC
HCT: 45.1
HCT: 47.6
HCT: 48.3
Hemoglobin: 15.6
Hemoglobin: 15.7
MCHC: 32.8
MCHC: 33
MCV: 79.4
MCV: 80
MCV: 80
Platelets: 146 — ABNORMAL LOW
Platelets: 167
Platelets: 168
RBC: 5.95 — ABNORMAL HIGH
RDW: 14.4
RDW: 15.3
RDW: 15.4
WBC: 6.4

## 2010-10-06 LAB — BASIC METABOLIC PANEL
BUN: 16
BUN: 17
BUN: 24 — ABNORMAL HIGH
BUN: 23
BUN: 25 — ABNORMAL HIGH
CO2: 24
CO2: 26
CO2: 26
CO2: 25
Calcium: 9
Calcium: 9.1
Calcium: 9.1
Chloride: 109
Chloride: 106
Chloride: 107
Creatinine, Ser: 1.74 — ABNORMAL HIGH
Creatinine, Ser: 1.76 — ABNORMAL HIGH
Creatinine, Ser: 1.77 — ABNORMAL HIGH
Creatinine, Ser: 1.59 — ABNORMAL HIGH
Creatinine, Ser: 1.71 — ABNORMAL HIGH
GFR calc Af Amer: 44 — ABNORMAL LOW
GFR calc Af Amer: 50 — ABNORMAL LOW
GFR calc Af Amer: 56 — ABNORMAL LOW
GFR calc non Af Amer: 37 — ABNORMAL LOW
GFR calc non Af Amer: 41 — ABNORMAL LOW
GFR calc non Af Amer: 42 — ABNORMAL LOW
Glucose, Bld: 106 — ABNORMAL HIGH
Potassium: 3.2 — ABNORMAL LOW
Potassium: 4
Potassium: 3.8
Sodium: 141

## 2010-10-06 LAB — TYPE AND SCREEN
ABO/RH(D): B POS
Antibody Screen: NEGATIVE

## 2010-10-06 LAB — PHOSPHORUS: Phosphorus: 3.3

## 2010-10-06 LAB — LUPUS ANTICOAGULANT PANEL: PTT Lupus Anticoagulant: 41.9 (ref 36.3–48.8)

## 2010-10-06 LAB — CARDIAC PANEL(CRET KIN+CKTOT+MB+TROPI)
Relative Index: 1
Total CK: 267 — ABNORMAL HIGH
Troponin I: 0.02

## 2010-10-06 LAB — BUN: BUN: 18

## 2010-10-06 LAB — PROTEIN C ACTIVITY: Protein C Activity: 177 % — ABNORMAL HIGH (ref 75–133)

## 2010-10-06 LAB — URINE MICROSCOPIC-ADD ON

## 2010-10-06 LAB — HEPATIC FUNCTION PANEL
ALT: 18
AST: 25
Bilirubin, Direct: 0.1
Total Bilirubin: 1.2

## 2010-10-06 LAB — TSH: TSH: 1.862

## 2010-10-06 LAB — DIFFERENTIAL
Basophils Absolute: 0
Basophils Absolute: 0.1
Basophils Relative: 1
Eosinophils Absolute: 0.1
Eosinophils Relative: 2
Lymphocytes Relative: 21
Lymphocytes Relative: 45
Lymphs Abs: 1.3
Lymphs Abs: 2
Monocytes Absolute: 0.4
Monocytes Absolute: 0.4
Monocytes Relative: 6
Monocytes Relative: 9
Neutro Abs: 1.7
Neutro Abs: 4.5
Neutrophils Relative %: 70

## 2010-10-06 LAB — CREATININE, SERUM
GFR calc Af Amer: 49 — ABNORMAL LOW
GFR calc non Af Amer: 41 — ABNORMAL LOW

## 2010-10-06 LAB — BASIC METABOLIC PANEL WITH GFR
Calcium: 9.6
Chloride: 103
GFR calc non Af Amer: 41 — ABNORMAL LOW
Glucose, Bld: 153 — ABNORMAL HIGH
Potassium: 4.3
Sodium: 138

## 2010-10-06 LAB — FACTOR 5 LEIDEN

## 2010-10-06 LAB — CK: Total CK: 224

## 2010-10-06 LAB — PROTHROMBIN GENE MUTATION

## 2010-10-06 LAB — LIPID PANEL
Cholesterol: 184
LDL Cholesterol: 115 — ABNORMAL HIGH
Total CHOL/HDL Ratio: 3.1

## 2010-10-06 LAB — APTT: aPTT: 29

## 2010-10-06 LAB — URINE CULTURE: Colony Count: 3000

## 2010-10-06 LAB — PROTIME-INR
INR: 0.9
Prothrombin Time: 11.8

## 2010-10-06 LAB — RPR: RPR Ser Ql: NONREACTIVE

## 2010-10-06 LAB — PROTEIN C, TOTAL: Protein C, Total: 92 % (ref 70–140)

## 2010-10-06 LAB — PROTEIN S, TOTAL: Protein S Ag, Total: 100 % (ref 70–140)

## 2010-10-06 LAB — PROTEIN S ACTIVITY: Protein S Activity: 122 % (ref 69–129)

## 2010-10-06 LAB — TROPONIN I: Troponin I: <0.01

## 2011-03-07 ENCOUNTER — Ambulatory Visit
Admission: RE | Admit: 2011-03-07 | Discharge: 2011-03-07 | Disposition: A | Discharge status: Home / Self Care | Payer: Medicare Other | Source: Ambulatory Visit | Attending: General Practice | Admitting: General Practice

## 2011-03-07 ENCOUNTER — Other Ambulatory Visit: Payer: Self-pay | Admitting: General Practice

## 2011-03-07 DIAGNOSIS — I10 Essential (primary) hypertension: Secondary | ICD-10-CM

## 2012-06-21 ENCOUNTER — Inpatient Hospital Stay (HOSPITAL_COMMUNITY): Payer: Medicare Other

## 2012-06-21 ENCOUNTER — Emergency Department (HOSPITAL_COMMUNITY): Payer: Medicare Other

## 2012-06-21 ENCOUNTER — Inpatient Hospital Stay (HOSPITAL_COMMUNITY)
Admission: EM | Admit: 2012-06-21 | Discharge: 2012-07-17 | DRG: 061 | Disposition: A | Discharge status: Other Acute Inpatient Hospital | Payer: Medicare Other | Attending: Internal Medicine | Admitting: Internal Medicine

## 2012-06-21 ENCOUNTER — Encounter (HOSPITAL_COMMUNITY): Payer: Self-pay | Admitting: Family Medicine

## 2012-06-21 DIAGNOSIS — I609 Nontraumatic subarachnoid hemorrhage, unspecified: Secondary | ICD-10-CM | POA: Diagnosis not present

## 2012-06-21 DIAGNOSIS — R4702 Dysphasia: Secondary | ICD-10-CM

## 2012-06-21 DIAGNOSIS — N289 Disorder of kidney and ureter, unspecified: Secondary | ICD-10-CM

## 2012-06-21 DIAGNOSIS — I10 Essential (primary) hypertension: Secondary | ICD-10-CM

## 2012-06-21 DIAGNOSIS — G936 Cerebral edema: Secondary | ICD-10-CM

## 2012-06-21 DIAGNOSIS — N183 Chronic kidney disease, stage 3 unspecified: Secondary | ICD-10-CM | POA: Diagnosis present

## 2012-06-21 DIAGNOSIS — E876 Hypokalemia: Secondary | ICD-10-CM | POA: Diagnosis not present

## 2012-06-21 DIAGNOSIS — K560 Paralytic ileus: Secondary | ICD-10-CM | POA: Diagnosis not present

## 2012-06-21 DIAGNOSIS — I129 Hypertensive chronic kidney disease with stage 1 through stage 4 chronic kidney disease, or unspecified chronic kidney disease: Secondary | ICD-10-CM | POA: Diagnosis present

## 2012-06-21 DIAGNOSIS — R471 Dysarthria and anarthria: Secondary | ICD-10-CM | POA: Diagnosis present

## 2012-06-21 DIAGNOSIS — I6350 Cerebral infarction due to unspecified occlusion or stenosis of unspecified cerebral artery: Secondary | ICD-10-CM

## 2012-06-21 DIAGNOSIS — D72829 Elevated white blood cell count, unspecified: Secondary | ICD-10-CM | POA: Diagnosis not present

## 2012-06-21 DIAGNOSIS — J690 Pneumonitis due to inhalation of food and vomit: Secondary | ICD-10-CM

## 2012-06-21 DIAGNOSIS — G9340 Encephalopathy, unspecified: Secondary | ICD-10-CM | POA: Diagnosis present

## 2012-06-21 DIAGNOSIS — G8190 Hemiplegia, unspecified affecting unspecified side: Secondary | ICD-10-CM | POA: Diagnosis present

## 2012-06-21 DIAGNOSIS — L89309 Pressure ulcer of unspecified buttock, unspecified stage: Secondary | ICD-10-CM | POA: Diagnosis not present

## 2012-06-21 DIAGNOSIS — R7309 Other abnormal glucose: Secondary | ICD-10-CM | POA: Diagnosis present

## 2012-06-21 DIAGNOSIS — I639 Cerebral infarction, unspecified: Secondary | ICD-10-CM

## 2012-06-21 DIAGNOSIS — I6990 Unspecified sequelae of unspecified cerebrovascular disease: Secondary | ICD-10-CM | POA: Diagnosis present

## 2012-06-21 DIAGNOSIS — R4020 Unspecified coma: Secondary | ICD-10-CM | POA: Diagnosis present

## 2012-06-21 DIAGNOSIS — H5340 Unspecified visual field defects: Secondary | ICD-10-CM | POA: Diagnosis present

## 2012-06-21 DIAGNOSIS — R1310 Dysphagia, unspecified: Secondary | ICD-10-CM | POA: Diagnosis not present

## 2012-06-21 DIAGNOSIS — I219 Acute myocardial infarction, unspecified: Secondary | ICD-10-CM

## 2012-06-21 DIAGNOSIS — R791 Abnormal coagulation profile: Secondary | ICD-10-CM | POA: Diagnosis not present

## 2012-06-21 DIAGNOSIS — E870 Hyperosmolality and hypernatremia: Secondary | ICD-10-CM

## 2012-06-21 DIAGNOSIS — R259 Unspecified abnormal involuntary movements: Secondary | ICD-10-CM

## 2012-06-21 DIAGNOSIS — I619 Nontraumatic intracerebral hemorrhage, unspecified: Secondary | ICD-10-CM

## 2012-06-21 DIAGNOSIS — J15211 Pneumonia due to Methicillin susceptible Staphylococcus aureus: Secondary | ICD-10-CM

## 2012-06-21 DIAGNOSIS — R279 Unspecified lack of coordination: Secondary | ICD-10-CM

## 2012-06-21 DIAGNOSIS — E785 Hyperlipidemia, unspecified: Secondary | ICD-10-CM | POA: Diagnosis present

## 2012-06-21 DIAGNOSIS — I169 Hypertensive crisis, unspecified: Secondary | ICD-10-CM

## 2012-06-21 DIAGNOSIS — J9600 Acute respiratory failure, unspecified whether with hypoxia or hypercapnia: Secondary | ICD-10-CM

## 2012-06-21 DIAGNOSIS — I6340 Cerebral infarction due to embolism of unspecified cerebral artery: Principal | ICD-10-CM | POA: Diagnosis present

## 2012-06-21 DIAGNOSIS — N390 Urinary tract infection, site not specified: Secondary | ICD-10-CM | POA: Diagnosis not present

## 2012-06-21 DIAGNOSIS — R569 Unspecified convulsions: Secondary | ICD-10-CM

## 2012-06-21 DIAGNOSIS — N179 Acute kidney failure, unspecified: Secondary | ICD-10-CM

## 2012-06-21 DIAGNOSIS — B965 Pseudomonas (aeruginosa) (mallei) (pseudomallei) as the cause of diseases classified elsewhere: Secondary | ICD-10-CM | POA: Diagnosis not present

## 2012-06-21 DIAGNOSIS — T45615A Adverse effect of thrombolytic drugs, initial encounter: Secondary | ICD-10-CM | POA: Diagnosis not present

## 2012-06-21 DIAGNOSIS — R509 Fever, unspecified: Secondary | ICD-10-CM

## 2012-06-21 DIAGNOSIS — Z9861 Coronary angioplasty status: Secondary | ICD-10-CM

## 2012-06-21 DIAGNOSIS — T884XXA Failed or difficult intubation, initial encounter: Secondary | ICD-10-CM

## 2012-06-21 DIAGNOSIS — R4701 Aphasia: Secondary | ICD-10-CM | POA: Diagnosis present

## 2012-06-21 DIAGNOSIS — I214 Non-ST elevation (NSTEMI) myocardial infarction: Secondary | ICD-10-CM | POA: Diagnosis not present

## 2012-06-21 DIAGNOSIS — I2510 Atherosclerotic heart disease of native coronary artery without angina pectoris: Secondary | ICD-10-CM | POA: Diagnosis present

## 2012-06-21 DIAGNOSIS — B9689 Other specified bacterial agents as the cause of diseases classified elsewhere: Secondary | ICD-10-CM | POA: Diagnosis not present

## 2012-06-21 DIAGNOSIS — Z8673 Personal history of transient ischemic attack (TIA), and cerebral infarction without residual deficits: Secondary | ICD-10-CM

## 2012-06-21 DIAGNOSIS — L8992 Pressure ulcer of unspecified site, stage 2: Secondary | ICD-10-CM | POA: Diagnosis not present

## 2012-06-21 DIAGNOSIS — Z66 Do not resuscitate: Secondary | ICD-10-CM | POA: Diagnosis not present

## 2012-06-21 DIAGNOSIS — IMO0002 Reserved for concepts with insufficient information to code with codable children: Secondary | ICD-10-CM

## 2012-06-21 DIAGNOSIS — Z79899 Other long term (current) drug therapy: Secondary | ICD-10-CM

## 2012-06-21 DIAGNOSIS — I6930 Unspecified sequelae of cerebral infarction: Secondary | ICD-10-CM | POA: Diagnosis present

## 2012-06-21 DIAGNOSIS — E46 Unspecified protein-calorie malnutrition: Secondary | ICD-10-CM | POA: Diagnosis not present

## 2012-06-21 DIAGNOSIS — R9431 Abnormal electrocardiogram [ECG] [EKG]: Secondary | ICD-10-CM

## 2012-06-21 HISTORY — DX: Failed or difficult intubation, initial encounter: T88.4XXA

## 2012-06-21 HISTORY — DX: Cerebral infarction, unspecified: I63.9

## 2012-06-21 HISTORY — DX: Essential (primary) hypertension: I10

## 2012-06-21 HISTORY — DX: Atherosclerotic heart disease of native coronary artery without angina pectoris: I25.10

## 2012-06-21 LAB — COMPREHENSIVE METABOLIC PANEL
AST: 29 U/L (ref 0–37)
BUN: 24 mg/dL — ABNORMAL HIGH (ref 6–23)
CO2: 26 mEq/L (ref 19–32)
Calcium: 9.3 mg/dL (ref 8.4–10.5)
Creatinine, Ser: 2.26 mg/dL — ABNORMAL HIGH (ref 0.50–1.35)
GFR calc non Af Amer: 30 mL/min — ABNORMAL LOW (ref >90)

## 2012-06-21 LAB — CBC
MCH: 20 pg — ABNORMAL LOW (ref 26.0–34.0)
MCV: 62.4 fL — ABNORMAL LOW (ref 78.0–100.0)
Platelets: 291 10*3/uL (ref 150–400)
RDW: 18.5 % — ABNORMAL HIGH (ref 11.5–15.5)

## 2012-06-21 LAB — POCT I-STAT TROPONIN I: Troponin i, poc: 0.02 ng/mL (ref 0.00–0.08)

## 2012-06-21 LAB — DIFFERENTIAL
Basophils Absolute: 0.1 10*3/uL (ref 0.0–0.1)
Eosinophils Relative: 3 % (ref 0–5)
Lymphs Abs: 4.2 10*3/uL — ABNORMAL HIGH (ref 0.7–4.0)
Monocytes Absolute: 1 10*3/uL (ref 0.1–1.0)

## 2012-06-21 LAB — GLUCOSE, CAPILLARY

## 2012-06-21 LAB — TROPONIN I: Troponin I: <0.3 ng/mL (ref <0.30)

## 2012-06-21 LAB — PROTIME-INR: Prothrombin Time: 12.1 seconds (ref 11.6–15.2)

## 2012-06-21 LAB — MRSA PCR SCREENING: MRSA by PCR: NEGATIVE

## 2012-06-21 MED ORDER — LORAZEPAM 2 MG/ML IJ SOLN
INTRAMUSCULAR | Status: AC
  Filled 2012-06-21: qty 1

## 2012-06-21 MED ORDER — NICARDIPINE HCL IN NACL 20-0.86 MG/200ML-% IV SOLN
5.0000 mg/h | INTRAVENOUS | Status: DC
  Administered 2012-06-21: 5 mg/h via INTRAVENOUS

## 2012-06-21 MED ORDER — ALTEPLASE (STROKE) FULL DOSE INFUSION
0.9000 mg/kg | Freq: Once | INTRAVENOUS | Status: AC
  Administered 2012-06-21: 70 mg via INTRAVENOUS
  Filled 2012-06-21: qty 70

## 2012-06-21 MED ORDER — SENNOSIDES-DOCUSATE SODIUM 8.6-50 MG PO TABS
1.0000 | ORAL_TABLET | Freq: Every evening | ORAL | Status: DC | PRN
  Administered 2012-07-03: 1 via ORAL
  Filled 2012-06-21: qty 1

## 2012-06-21 MED ORDER — LORAZEPAM 2 MG/ML IJ SOLN
2.0000 mg | Freq: Once | INTRAMUSCULAR | Status: AC
  Administered 2012-06-21: 2 mg via INTRAVENOUS

## 2012-06-21 MED ORDER — PANTOPRAZOLE SODIUM 40 MG IV SOLR
40.0000 mg | Freq: Every day | INTRAVENOUS | Status: DC
  Administered 2012-06-21 – 2012-06-29 (×9): 40 mg via INTRAVENOUS
  Filled 2012-06-21 (×10): qty 40

## 2012-06-21 MED ORDER — NICARDIPINE HCL IN NACL 20-0.86 MG/200ML-% IV SOLN
3.0000 mg/h | INTRAVENOUS | Status: DC
  Administered 2012-06-21: 5 mg/h via INTRAVENOUS
  Administered 2012-06-25: 10 mg/h via INTRAVENOUS
  Administered 2012-06-25: 15 mg/h via INTRAVENOUS
  Administered 2012-06-25: 3 mg/h via INTRAVENOUS
  Administered 2012-06-25: 12 mg/h via INTRAVENOUS
  Administered 2012-06-26 (×2): 6 mg/h via INTRAVENOUS
  Administered 2012-06-26: 15 mg/h via INTRAVENOUS
  Administered 2012-06-26: 6 mg/h via INTRAVENOUS
  Administered 2012-06-26: 5 mg/h via INTRAVENOUS
  Administered 2012-06-26: 6 mg/h via INTRAVENOUS
  Filled 2012-06-21 (×12): qty 200

## 2012-06-21 MED ORDER — LABETALOL HCL 5 MG/ML IV SOLN
20.0000 mg | Freq: Once | INTRAVENOUS | Status: AC
  Administered 2012-06-21: 20 mg via INTRAVENOUS

## 2012-06-21 MED ORDER — ACETAMINOPHEN 650 MG RE SUPP
650.0000 mg | RECTAL | Status: DC | PRN
  Administered 2012-06-28: 650 mg via RECTAL
  Filled 2012-06-21: qty 1

## 2012-06-21 MED ORDER — LABETALOL HCL 5 MG/ML IV SOLN
INTRAVENOUS | Status: AC
  Filled 2012-06-21: qty 4

## 2012-06-21 MED ORDER — SODIUM CHLORIDE 0.9 % IV SOLN
INTRAVENOUS | Status: DC
  Administered 2012-06-21: 22:00:00 via INTRAVENOUS

## 2012-06-21 MED ORDER — ACETAMINOPHEN 325 MG PO TABS
650.0000 mg | ORAL_TABLET | ORAL | Status: DC | PRN
  Administered 2012-06-22 – 2012-06-29 (×21): 650 mg via ORAL
  Filled 2012-06-21 (×21): qty 2

## 2012-06-21 NOTE — ED Notes (Signed)
7mg  bolus complete

## 2012-06-21 NOTE — Code Documentation (Signed)
Patient in normal state of health with wife at school program when he developed confusion and aphasia. Code stroke called 1912, patient arrived at 65, [place] at 33, EDP exam at 1928, stroke team arrived at 1925, neurologist arrived at 1918, patient arrived to CT at 1929, patient extremely agitated in CT, orders received for 2mg  ativan given per Dr. Roseanne Reno, phlebotomist arrived at 1925, CT read by neurologist at 1935, pharmacy notified for tPA at 1945, tPA delivered to bedside at 1957. 20mg  IV Labetalol given for SBP 207 at 1948, IV cardene started at 2003 at 5mg . tPA started at 2017. ICU bed requested at 2026. Initial NIH 08

## 2012-06-21 NOTE — H&P (Signed)
Admission H&P    Chief Complaint: Acute onset of confusion and inability to speak.  HPI: [name] is an 57 y.o. 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 No focal weakness was noted. Patient has been taking aspirin 81 mg per day. CT scan showed no acute intracranial abnormality. NIH stroke score was 8. Patient had marked expressive aphasia as well as gaze preference to the left side and right visual field defect to visual confrontation. He was deemed a candidate for intravenous thrombolytic therapy with TPA.   LSN: [id number] on 06/21/2012 tPA Given: Yes MRankin: 2  No past medical history on file.  No past surgical history on file.  No family history on file. Social History:  has no tobacco, alcohol, and drug history on file.  Allergies: Allergies no known allergies   (Not in a hospital admission)  ROS: History obtained from spouse  General ROS: negative for - chills, fatigue, fever, night sweats, weight gain or weight loss Psychological ROS: negative for - behavioral disorder, hallucinations, memory difficulties, mood swings or suicidal ideation Ophthalmic ROS: negative for - blurry vision, double vision, eye pain or loss of vision ENT ROS: negative for - epistaxis, nasal discharge, oral lesions, sore throat, tinnitus or vertigo Allergy and Immunology ROS: negative for - hives or itchy/watery eyes Hematological and Lymphatic ROS: negative for - bleeding problems, bruising or swollen lymph nodes Endocrine ROS: negative for - galactorrhea, hair pattern changes, polydipsia/polyuria or temperature intolerance Respiratory ROS: negative for - cough, hemoptysis, shortness of breath or  wheezing Cardiovascular ROS: negative for - chest pain, dyspnea on exertion, edema or irregular heartbeat Gastrointestinal ROS: negative for - abdominal pain, diarrhea, hematemesis, nausea/vomiting or stool incontinence Genito-Urinary ROS: negative for - dysuria, hematuria, incontinence or urinary frequency/urgency Musculoskeletal ROS: negative for - joint swelling or muscular weakness Neurological ROS: as noted in HPI Dermatological ROS: negative for rash and skin lesion changes  Physical Examination: Blood pressure 207/106, pulse 97, resp. rate 27, weight 77.9 kg (171 lb 11.8 oz), SpO2 98.00%.  HEENT-  Normocephalic, no lesions, without obvious abnormality.  Normal external eye and conjunctiva.  Normal TM's bilaterally.  Normal auditory canals and external ears. Normal external nose, mucus membranes and septum.  Normal pharynx. Neck supple with no masses, nodes, nodules or enlargement. Cardiovascular - regular rate and rhythm, S1, S2 normal, no murmur, click, rub or gallop Lungs - chest clear, no wheezing, rales, normal symmetric air entry, Heart exam - S1, S2 normal, no murmur, no gallop, rate regular Abdomen - soft, non-tender; bowel sounds normal; no masses,  no organomegaly Extremities - no joint deformities, effusion, or inflammation and no edema  Neurologic Examination: Mental Status: Marked expressive aphasia with unintelligible speech output and attendant frustration. Able to follow commands without difficulty, however. Cranial Nerves: II-right visual field defect to visual threat. III/IV/VI-Pupils were equal and reacted. Extraocular movements were full and conjugate, but clear gaze preference to the left side.    V/VII-no facial weakness. VIII-normal. X-nonsensical speech output with equivocal dysarthria. Motor: 5/5 bilaterally with normal tone and bulk Sensory: Normal throughout. Deep Tendon Reflexes: 2+ and symmetric. Plantars: Flexor bilaterally Cerebellar: Normal  finger-to-nose testing. Carotid auscultation: Normal  Results for orders placed during the hospital encounter of 06/21/12 (from the past 48 hour(s))  PROTIME-INR     Status: None   Collection Time    06/21/12  7:40 PM      Result Value Range   Prothrombin Time 12.1  11.6 - 15.2 seconds   INR 0.90  0.00 - 1.49  APTT     Status: None   Collection Time    06/21/12  7:40 PM      Result Value Range   aPTT 29  24 - 37 seconds  CBC     Status: Abnormal   Collection Time    06/21/12  7:40 PM      Result Value Range   WBC 9.0  4.0 - 10.5 K/uL   RBC 7.15 (*) 4.22 - 5.81 MIL/uL   Hemoglobin 14.3  13.0 - 17.0 g/dL   HCT 96.0  45.4 - 09.8 %   MCV 62.4 (*) 78.0 - 100.0 fL   MCH 20.0 (*) 26.0 - 34.0 pg   MCHC 32.1  30.0 - 36.0 g/dL   RDW 11.9 (*) 14.7 - 82.9 %   Platelets 291  150 - 400 K/uL  DIFFERENTIAL     Status: None   Collection Time    06/21/12  7:40 PM      Result Value Range   Neutrophils Relative % PENDING  43 - 77 %   Neutro Abs PENDING  1.7 - 7.7 K/uL   Band Neutrophils PENDING  0 - 10 %   Lymphocytes Relative PENDING  12 - 46 %   Lymphs Abs PENDING  0.7 - 4.0 K/uL   Monocytes Relative PENDING  3 - 12 %   Monocytes Absolute PENDING  0.1 - 1.0 K/uL   Eosinophils Relative PENDING  0 - 5 %   Eosinophils Absolute PENDING  0.0 - 0.7 K/uL   Basophils Relative PENDING  0 - 1 %   Basophils Absolute PENDING  0.0 - 0.1 K/uL   WBC Morphology PENDING     RBC Morphology PENDING     Smear Review PENDING     nRBC PENDING  0 /100 WBC   Metamyelocytes Relative PENDING     Myelocytes PENDING     Promyelocytes Absolute PENDING     Blasts PENDING    COMPREHENSIVE METABOLIC PANEL     Status: Abnormal   Collection Time    06/21/12  7:40 PM      Result Value Range   Sodium 135  135 - 145 mEq/L   Potassium 3.1 (*) 3.5 - 5.1 mEq/L   Chloride 97  96 - 112 mEq/L   CO2 26  19 - 32 mEq/L   Glucose, Bld 138 (*) 70 - 99 mg/dL   BUN 24 (*) 6 - 23 mg/dL   Creatinine, Ser 5.62 (*) 0.50 - 1.35  mg/dL   Calcium 9.3  8.4 - 13.0 mg/dL   Total Protein 8.3  6.0 - 8.3 g/dL   Albumin 4.1  3.5 - 5.2 g/dL   AST 29  0 - 37 U/L   ALT 19  0 - 53 U/L   Alkaline Phosphatase 69  39 - 117 U/L   Total Bilirubin 0.5  0.3 - 1.2 mg/dL   GFR calc non Af Amer 30 (*) >90 mL/min   GFR calc Af Amer 35 (*) >90 mL/min   Comment:            The eGFR has been calculated     using the CKD EPI equation.     This calculation has not been  validated in all clinical     situations.     eGFR's persistently     <90 mL/min signify     possible Chronic Kidney Disease.  TROPONIN I     Status: None   Collection Time    06/21/12  7:40 PM      Result Value Range   Troponin I <0.30  <0.30 ng/mL   Comment:            Due to the release kinetics of cTnI,     a negative result within the first hours     of the onset of symptoms does not rule out     myocardial infarction with certainty.     If myocardial infarction is still suspected,     repeat the test at appropriate intervals.  POCT I-STAT TROPONIN I     Status: None   Collection Time    06/21/12  7:47 PM      Result Value Range   Troponin i, poc 0.02  0.00 - 0.08 ng/mL   Comment 3            Comment: Due to the release kinetics of cTnI,     a negative result within the first hours     of the onset of symptoms does not rule out     myocardial infarction with certainty.     If myocardial infarction is still suspected,     repeat the test at appropriate intervals.   Ct Head Wo Contrast  06/21/2012   *RADIOLOGY REPORT*  Clinical Data: Code stroke  CT HEAD WITHOUT CONTRAST  Technique:  Contiguous axial images were obtained from the base of the skull through the vertex without contrast.  Comparison: CT 05/21/2007  Findings: Chronic ischemic changes in the white matter, right greater than left.  Chronic infarct in the right putamen and internal capsule extending into the white matter is unchanged.  No acute infarct.  No acute hemorrhage or mass.  No change  from the prior study.  IMPRESSION: Chronic ischemic changes, right greater than left.  No acute abnormality.  Critical Value/emergent results were called by telephone at the time of interpretation on 06/21/2012 at 1950 hours to Dr. Roseanne Reno, who verbally acknowledged these results.   Original Report Authenticated By: Janeece Riggers, M.D.    Assessment: 57 y.o. male with a history of previous stroke and hypertension presenting with probable acute left posterior MCA territory ischemic infarction.  Stroke Risk Factors - hypertension  Plan: 1. HgbA1c, fasting lipid panel 2. MRI, MRA  of the brain without contrast 3. PT consult, OT consult, Speech consult 4. Echocardiogram 5. Carotid dopplers 6. Prophylactic therapy-Antiplatelet med: Plavix 75 mg per day if CT scan 24 hours post TPA administration shows no intracranial hemorrhage. 7. Risk factor modification 8. Telemetry monitoring  Patient's admission workup required complex diagnostic testing and clinical decision making, including treatment with intravenous thrombolytic therapy, as well as time spent with patient's family. Total critical care time was 90 minutes.   C.R. Roseanne Reno, MD [facility] [phone]  06/21/2012, 8:11 PM

## 2012-06-21 NOTE — ED Notes (Signed)
Attempted report 

## 2012-06-21 NOTE — ED Provider Notes (Signed)
History     CSN: [id number]  Arrival date & time 06/21/12  1918   First MD Initiated Contact with Patient 06/21/12 1932      Chief Complaint  Patient presents with  . Code Stroke    (Consider location/radiation/quality/duration/timing/severity/associated sxs/prior treatment) HPI Level 5 caveat due to aphasia and need for intervention Pt with remote history of stroke brought to the ED via EMS as a Code Stroke. LSN was 1845, having difficulty talking and weak in arms. Weakness has improved but still unable to talk. Airway intact on arrival and taken directly to CT.   No past medical history on file.  No past surgical history on file.  No family history on file.  History  Substance Use Topics  . Smoking status: Not on file  . Smokeless tobacco: Not on file  . Alcohol Use: Not on file      Review of Systems All other systems reviewed and are negative except as noted in HPI.   Allergies  Review of patient's allergies indicates not on file.  Home Medications  No current outpatient prescriptions on file.  There were no vitals taken for this visit.  Physical Exam  Nursing note and vitals reviewed. Constitutional: He appears well-developed and well-nourished.  HENT:  Head: Normocephalic and atraumatic.  Eyes: EOM are normal. Pupils are equal, round, and reactive to light.  Neck: Normal range of motion. Neck supple.  Cardiovascular: Normal rate, normal heart sounds and intact distal pulses.   Pulmonary/Chest: Effort normal and breath sounds normal.  Abdominal: Bowel sounds are normal. He exhibits no distension. There is no tenderness.  Musculoskeletal: Normal range of motion. He exhibits no edema and no tenderness.  Neurological: He is alert. He has normal strength. No cranial nerve deficit.  Marked expressive aphasia; for full HIHSS please see Code Stroke team eval.   Skin: Skin is warm and dry. No rash noted.  Psychiatric: He has a normal mood and affect.    ED  Course  Procedures (including critical care time)  Labs Reviewed  CBC - Abnormal; Notable for the following:    RBC 7.15 (*)    MCV 62.4 (*)    MCH 20.0 (*)    RDW 18.5 (*)    All other components within normal limits  DIFFERENTIAL - Abnormal; Notable for the following:    Neutrophils Relative % 38 (*)    Lymphocytes Relative 47 (*)    Lymphs Abs 4.2 (*)    All other components within normal limits  COMPREHENSIVE METABOLIC PANEL - Abnormal; Notable for the following:    Potassium 3.1 (*)    Glucose, Bld 138 (*)    BUN 24 (*)    Creatinine, Ser 2.26 (*)    GFR calc non Af Amer 30 (*)    GFR calc Af Amer 35 (*)    All other components within normal limits  GLUCOSE, CAPILLARY - Abnormal; Notable for the following:    Glucose-Capillary 153 (*)    All other components within normal limits  MRSA PCR SCREENING  PROTIME-INR  APTT  TROPONIN I  HEMOGLOBIN A1C  LIPID PANEL  POCT I-STAT TROPONIN I   Dg Chest 2 View  06/21/2012   *RADIOLOGY REPORT*  Clinical Data: Cerebral infarction.  CHEST - 2 VIEW  Comparison: 03/07/2011  Findings: Stable moderate cardiomegaly without evidence of pulmonary edema or pleural effusion.  No focal consolidation is identified in the lungs.  The bony thorax is unremarkable.  IMPRESSION: Moderate cardiomegaly.  No  active disease.   Original Report Authenticated By: Irish Lack, M.D.   Ct Head Wo Contrast  06/21/2012   *RADIOLOGY REPORT*  Clinical Data: Code stroke  CT HEAD WITHOUT CONTRAST  Technique:  Contiguous axial images were obtained from the base of the skull through the vertex without contrast.  Comparison: CT 05/21/2007  Findings: Chronic ischemic changes in the white matter, right greater than left.  Chronic infarct in the right putamen and internal capsule extending into the white matter is unchanged.  No acute infarct.  No acute hemorrhage or mass.  No change from the prior study.  IMPRESSION: Chronic ischemic changes, right greater than left.  No  acute abnormality.  Critical Value/emergent results were called by telephone at the time of interpretation on 06/21/2012 at 1950 hours to Dr. Roseanne Reno, who verbally acknowledged these results.   Original Report Authenticated By: Janeece Riggers, M.D.     1. Stroke   2. Abnormal EKG       MDM   Date: 06/21/2012  Rate: 100  Rhythm: sinus tachycardia  QRS Axis: right  Intervals: normal  ST/T Wave abnormalities: ST depressions inferiorly and ST depressions laterally  Conduction Disutrbances:none available  Narrative Interpretation:   Old EKG Reviewed: none available  Pt with elevated BP, marked expressive aphasia. He has ischemic changes on EKG but denies chest pain or SOB (he is able to accurately answer yes/no). Could be pressure related, will recheck EKG after improved BP. Pt to be given tPA per Dr. Marca Ancona recommendations.   CRITICAL CARE Performed by: Pollyann Savoy Total critical care time: 30 Critical care time was exclusive of separately billable procedures and treating other patients. Critical care was necessary to treat or prevent imminent or life-threatening deterioration. Critical care was time spent personally by me on the following activities: development of treatment plan with patient and/or surrogate as well as nursing, discussions with consultants, evaluation of patient's response to treatment, examination of patient, obtaining history from patient or surrogate, ordering and performing treatments and interventions, ordering and review of laboratory studies, ordering and review of radiographic studies, pulse oximetry and re-evaluation of patient's condition.           Charles B. Bernette Mayers, MD 06/22/12 0454

## 2012-06-22 ENCOUNTER — Inpatient Hospital Stay (HOSPITAL_COMMUNITY): Payer: Medicare Other

## 2012-06-22 ENCOUNTER — Encounter (HOSPITAL_COMMUNITY): Payer: Self-pay | Admitting: Pulmonary Disease

## 2012-06-22 DIAGNOSIS — N179 Acute kidney failure, unspecified: Secondary | ICD-10-CM | POA: Diagnosis present

## 2012-06-22 DIAGNOSIS — I169 Hypertensive crisis, unspecified: Secondary | ICD-10-CM | POA: Diagnosis not present

## 2012-06-22 DIAGNOSIS — I219 Acute myocardial infarction, unspecified: Secondary | ICD-10-CM | POA: Diagnosis not present

## 2012-06-22 DIAGNOSIS — R259 Unspecified abnormal involuntary movements: Secondary | ICD-10-CM

## 2012-06-22 DIAGNOSIS — E876 Hypokalemia: Secondary | ICD-10-CM | POA: Diagnosis not present

## 2012-06-22 DIAGNOSIS — G936 Cerebral edema: Secondary | ICD-10-CM

## 2012-06-22 DIAGNOSIS — I6990 Unspecified sequelae of unspecified cerebrovascular disease: Secondary | ICD-10-CM | POA: Diagnosis present

## 2012-06-22 DIAGNOSIS — I619 Nontraumatic intracerebral hemorrhage, unspecified: Secondary | ICD-10-CM | POA: Diagnosis present

## 2012-06-22 DIAGNOSIS — I639 Cerebral infarction, unspecified: Secondary | ICD-10-CM

## 2012-06-22 DIAGNOSIS — R279 Unspecified lack of coordination: Secondary | ICD-10-CM

## 2012-06-22 DIAGNOSIS — R569 Unspecified convulsions: Secondary | ICD-10-CM | POA: Diagnosis not present

## 2012-06-22 DIAGNOSIS — T884XXA Failed or difficult intubation, initial encounter: Secondary | ICD-10-CM

## 2012-06-22 DIAGNOSIS — J9600 Acute respiratory failure, unspecified whether with hypoxia or hypercapnia: Secondary | ICD-10-CM | POA: Diagnosis present

## 2012-06-22 HISTORY — DX: Hypertensive crisis, unspecified: I16.9

## 2012-06-22 HISTORY — DX: Cerebral infarction, unspecified: I63.9

## 2012-06-22 HISTORY — DX: Failed or difficult intubation, initial encounter: T88.4XXA

## 2012-06-22 HISTORY — DX: Unspecified convulsions: R56.9

## 2012-06-22 LAB — LIPID PANEL
LDL Cholesterol: 109 mg/dL — ABNORMAL HIGH (ref 0–99)
Total CHOL/HDL Ratio: 3.2 RATIO
VLDL: 18 mg/dL (ref 0–40)

## 2012-06-22 LAB — BLOOD GAS, ARTERIAL
Drawn by: 39898
MECHVT: 600 mL
TCO2: 23.4 mmol/L (ref 0–100)
pCO2 arterial: 34.7 mmHg — ABNORMAL LOW (ref 35.0–45.0)
pH, Arterial: 7.425 (ref 7.350–7.450)

## 2012-06-22 LAB — TROPONIN I
Troponin I: 7.64 ng/mL (ref <0.30)
Troponin I: 11.15 ng/mL (ref <0.30)
Troponin I: 10.83 ng/mL (ref <0.30)

## 2012-06-22 LAB — SODIUM: Sodium: 143 mEq/L (ref 135–145)

## 2012-06-22 LAB — HEMOGLOBIN A1C: Mean Plasma Glucose: 126 mg/dL — ABNORMAL HIGH (ref <117)

## 2012-06-22 MED ORDER — SODIUM CHLORIDE 3 % IV SOLN
INTRAVENOUS | Status: DC
  Administered 2012-06-22: 11:00:00 via INTRAVENOUS
  Administered 2012-06-23: 75 mL/h via INTRAVENOUS
  Administered 2012-06-23: 03:00:00 via INTRAVENOUS
  Filled 2012-06-22 (×7): qty 500

## 2012-06-22 MED ORDER — MIDAZOLAM HCL 2 MG/2ML IJ SOLN
2.0000 mg | Freq: Once | INTRAMUSCULAR | Status: AC
  Administered 2012-06-22: 2 mg via INTRAVENOUS

## 2012-06-22 MED ORDER — FENTANYL CITRATE 0.05 MG/ML IJ SOLN
100.0000 ug | Freq: Once | INTRAMUSCULAR | Status: AC
  Administered 2012-06-22: 100 ug via INTRAVENOUS

## 2012-06-22 MED ORDER — FENTANYL CITRATE 0.05 MG/ML IJ SOLN
INTRAMUSCULAR | Status: AC
  Filled 2012-06-22: qty 4

## 2012-06-22 MED ORDER — LORAZEPAM 2 MG/ML IJ SOLN
INTRAMUSCULAR | Status: AC
  Filled 2012-06-22: qty 1

## 2012-06-22 MED ORDER — BIOTENE DRY MOUTH MT LIQD
15.0000 mL | Freq: Four times a day (QID) | OROMUCOSAL | Status: DC
  Administered 2012-06-23 – 2012-07-17 (×98): 15 mL via OROMUCOSAL

## 2012-06-22 MED ORDER — STROKE: EARLY STAGES OF RECOVERY BOOK
Freq: Once | Status: AC
  Administered 2012-06-22: 04:00:00
  Filled 2012-06-22: qty 1

## 2012-06-22 MED ORDER — MIDAZOLAM HCL 2 MG/2ML IJ SOLN
INTRAMUSCULAR | Status: AC
  Filled 2012-06-22: qty 4

## 2012-06-22 MED ORDER — SODIUM CHLORIDE 0.9 % IV SOLN
1000.0000 mg | Freq: Once | INTRAVENOUS | Status: DC
  Filled 2012-06-22: qty 10

## 2012-06-22 MED ORDER — SODIUM CHLORIDE 0.9 % IV BOLUS (SEPSIS)
750.0000 mL | Freq: Once | INTRAVENOUS | Status: AC
  Administered 2012-06-22: 750 mL via INTRAVENOUS

## 2012-06-22 MED ORDER — SODIUM CHLORIDE 0.9 % IV SOLN
1500.0000 mg | Freq: Once | INTRAVENOUS | Status: AC
  Administered 2012-06-22: 1500 mg via INTRAVENOUS
  Filled 2012-06-22: qty 30

## 2012-06-22 MED ORDER — LORAZEPAM BOLUS VIA INFUSION: 1.0000 mg | INTRAVENOUS | Status: DC | PRN

## 2012-06-22 MED ORDER — FENTANYL CITRATE 0.05 MG/ML IJ SOLN
25.0000 ug | INTRAMUSCULAR | Status: DC | PRN
  Administered 2012-06-24 – 2012-06-28 (×5): 100 ug via INTRAVENOUS
  Filled 2012-06-22 (×5): qty 2

## 2012-06-22 MED ORDER — PHENYTOIN SODIUM 50 MG/ML IJ SOLN
100.0000 mg | Freq: Three times a day (TID) | INTRAMUSCULAR | Status: DC
  Administered 2012-06-22 – 2012-06-28 (×19): 100 mg via INTRAVENOUS
  Filled 2012-06-22 (×21): qty 2

## 2012-06-22 MED ORDER — PROPOFOL 10 MG/ML IV EMUL
5.0000 ug/kg/min | INTRAVENOUS | Status: DC
  Administered 2012-06-22: 20 ug/kg/min via INTRAVENOUS
  Administered 2012-06-22: 15 ug/kg/min via INTRAVENOUS
  Administered 2012-06-23: 20 ug/kg/min via INTRAVENOUS
  Administered 2012-06-23 – 2012-06-24 (×5): 30 ug/kg/min via INTRAVENOUS
  Administered 2012-06-25 (×2): 35 ug/kg/min via INTRAVENOUS
  Administered 2012-06-25: 30 ug/kg/min via INTRAVENOUS
  Administered 2012-06-26 (×2): 50 ug/kg/min via INTRAVENOUS
  Administered 2012-06-26 (×2): 40 ug/kg/min via INTRAVENOUS
  Administered 2012-06-26: 50 ug/kg/min via INTRAVENOUS
  Administered 2012-06-27: 45 ug/kg/min via INTRAVENOUS
  Administered 2012-06-27: 30 ug/kg/min via INTRAVENOUS
  Administered 2012-06-27: 45 ug/kg/min via INTRAVENOUS
  Administered 2012-06-27: 35 ug/kg/min via INTRAVENOUS
  Administered 2012-06-28: 15 ug/kg/min via INTRAVENOUS
  Administered 2012-06-28: 30 ug/kg/min via INTRAVENOUS
  Filled 2012-06-22 (×24): qty 100

## 2012-06-22 MED ORDER — LORAZEPAM 2 MG/ML IJ SOLN
1.0000 mg | Freq: Once | INTRAMUSCULAR | Status: AC
  Administered 2012-06-22: 1 mg via INTRAVENOUS

## 2012-06-22 MED ORDER — SODIUM CHLORIDE 0.9 % IV SOLN
500.0000 mg | Freq: Two times a day (BID) | INTRAVENOUS | Status: DC
  Filled 2012-06-22: qty 5

## 2012-06-22 MED ORDER — ETOMIDATE 2 MG/ML IV SOLN
20.0000 mg | Freq: Once | INTRAVENOUS | Status: AC
  Administered 2012-06-22: 20 mg via INTRAVENOUS

## 2012-06-22 MED ORDER — ROCURONIUM BROMIDE 50 MG/5ML IV SOLN
40.0000 mg | Freq: Once | INTRAVENOUS | Status: AC
  Administered 2012-06-22: 40 mg via INTRAVENOUS
  Filled 2012-06-22: qty 4

## 2012-06-22 MED ORDER — METOPROLOL TARTRATE 1 MG/ML IV SOLN
2.5000 mg | INTRAVENOUS | Status: DC | PRN
  Filled 2012-06-22 (×2): qty 5

## 2012-06-22 MED ORDER — LORAZEPAM 2 MG/ML IJ SOLN
1.0000 mg | INTRAMUSCULAR | Status: DC | PRN
  Filled 2012-06-22: qty 1

## 2012-06-22 MED ORDER — LORAZEPAM 2 MG/ML IJ SOLN
1.0000 mg | INTRAMUSCULAR | Status: DC | PRN
  Administered 2012-06-22: 1 mg via INTRAVENOUS

## 2012-06-22 MED ORDER — CHLORHEXIDINE GLUCONATE 0.12 % MT SOLN
15.0000 mL | Freq: Two times a day (BID) | OROMUCOSAL | Status: DC
  Administered 2012-06-22 – 2012-07-17 (×50): 15 mL via OROMUCOSAL
  Filled 2012-06-22 (×54): qty 15

## 2012-06-22 NOTE — Progress Notes (Signed)
SLP Cancellation Note  Patient Details Name: [name] MRN: [id number] DOB: 1955/04/27   Cancelled treatment:        Orders for BSE/SLE cancelled due to pt now intubated.  Please reconsult when appropriate.  Celia B. Murvin Natal [place] [facility], CCC-SLP [phone]   Leigh Aurora 06/22/2012, 9:54 AM

## 2012-06-22 NOTE — Progress Notes (Signed)
Nursing 1400 Patient now flexion posturing on left side, Larita Fife, [place] for Stroke, made aware. STAT portable head CT ordered.

## 2012-06-22 NOTE — Progress Notes (Addendum)
Nursing [phone] RN to bedside with PM RN to assess patient.  Patient agitated and having intermittent tremors on left side of face and body.  RNs unable to calm patient and SBP now >180.  Cardene gtt restarted per orders.  Patient's pupils are equal and reactive, but patient resisting in upper extremities, but not following commands at this time. Dr. Roseanne Reno paged and notified of patient's agitation.  Order to give Ativan 1mg  IVP, medication given as ordered.  Will continue to assess.

## 2012-06-22 NOTE — Procedures (Signed)
Central Venous Catheter Insertion Procedure Note [name] [id number] 01-31-1955  Procedure: Insertion of Central Venous Catheter Indications: Assessment of intravascular volume, Drug and/or fluid administration and Frequent blood sampling  Procedure Details Consent: Risks of procedure as well as the alternatives and risks of each were explained to the (patient/caregiver).  Consent for procedure obtained. Time Out: Verified patient identification, verified procedure, site/side was marked, verified correct patient position, special equipment/implants available, medications/allergies/relevent history reviewed, required imaging and test results available.  Performed Real time [place] used to ID and cannulate the vessel  Maximum sterile technique was used including antiseptics, cap, gloves, gown, hand hygiene, mask and sheet. Skin prep: Chlorhexidine; local anesthetic administered A antimicrobial bonded/coated triple lumen catheter was placed in the right internal jugular vein using the Seldinger technique.  Evaluation Blood flow good Complications: No apparent complications Patient did tolerate procedure well. Chest X-ray ordered to verify placement.  CXR: pending.  BABCOCK,PETE 06/22/2012, 11:07 AM  I was present for and supervised the entire procedure  Billy Fischer, MD ; [facility] [phone].  After 5:30 PM or weekends, call [phone]

## 2012-06-22 NOTE — Progress Notes (Signed)
Nursing 1700 Larita Fife, Stroke PA aware of patient's assessment and temperature.  Na level reported.  Will increase 3% saline to 75cc/hr.

## 2012-06-22 NOTE — Consult Note (Addendum)
PULMONARY  / CRITICAL CARE MEDICINE  Name: [name] MRN: [id number] DOB: [birth year]    ADMISSION DATE:  06/21/2012 CONSULTATION DATE:  6/13  REFERRING MD :  sethi  PRIMARY SERVICE: stroke team   CHIEF COMPLAINT:  Asked to see for ventilator support, central access and supportive critical care services   BRIEF PATIENT DESCRIPTION:  This is 57 year old male admitted 6/12 w/ Left posterior MCA infarct. Got TPA. Found to have AMS the am of 6/13, CT showed new left ICH w/ midline shift. PCCM asked to see for airway support, IV access and supportive critical care.   SIGNIFICANT EVENTS / STUDIES:  CT head 5/12: Chronic ischemic changes, right greater than left. No acute  abnormality CT head 5/13: Image quality degraded by significant motion. There is a large area of hemorrhage in the left parietal lobe with mass effect and 10 mm midline shift. This is most consistent with  hemorrhagic infarction following TPA.   LINES / TUBES: OETT 6/13 (DIFFICULT AIRWAY, Cords swollen, glide scope, #7, very deep)>>> Right IJ 6/13>>>  CULTURES:   ANTIBIOTICS:   HISTORY OF PRESENT ILLNESS:    57 y.o. 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 Who developed acute onset of inability to speak and confusion at 1845 on 4/12. CT scan showed no acute intracranial abnormality. NIH stroke score was 8. Patient had marked expressive aphasia as well as gaze preference to the left side and right visual field defect to visual confrontation. He was deemed a candidate for intravenous thrombolytic therapy with TPA and treated. The am of 6/13 he developed acute change in MS, tremor on left side (did have right sided hemiparesis). CT head showed large area of hemorrhage in left parietal lobe and 10mm midline shift. PCCM asked to see for airway/ventilatory support as well as IV access for 3% saline.    PAST MEDICAL HISTORY :  Past Medical History  Diagnosis Date  . Stroke 2007  . Hypertension   . Difficult airway for intubation 06/22/2012    Deep and anterior. Difficult with standard laryngoscope. Easily intubated with Glidescope    Past Surgical History  Procedure Laterality Date  . Coronary stent placement    . Cardiac surgery     Prior to Admission medications   Medication Sig Start Date End Date Taking? Authorizing Provider  atenolol (TENORMIN) 25 MG tablet Take 25 mg by mouth daily.   Yes Historical Provider, MD  hydrochlorothiazide (HYDRODIURIL) 25 MG tablet Take 25 mg by mouth daily.   Yes Historical Provider, MD  losartan (COZAAR) 100 MG tablet Take 100 mg by mouth daily.   Yes Historical Provider, MD  sildenafil (VIAGRA) 100 MG tablet Take 100 mg by mouth daily as needed for erectile dysfunction.   Yes Historical Provider, MD   No Known Allergies  FAMILY HISTORY:  History reviewed. No pertinent family history. SOCIAL HISTORY:  reports that he has never smoked. He has never used smokeless tobacco. He reports that he does not drink alcohol. His drug history is not on file.  REVIEW OF SYSTEMS:  Unable   SUBJECTIVE:  Sedated on vent  VITAL SIGNS: Temp:  [97.4 F (36.3 C)-99.9 F (37.7 C)] 99.9 F (37.7 C) (06/13 0800) Pulse Rate:  [34-120] 78 (06/13 1000) Resp:  [14-34] 14 (06/13 1000) BP: (129-217)/(57-109) 145/74 mmHg (06/13 0945) SpO2:  [93 %-100 %] 99 % (06/13 1000) FiO2 (%):  [100 %]  100 % (06/13 1000) Weight:  [77.9 kg (171 lb 11.8 oz)] 77.9 kg (171 lb 11.8 oz) (06/12 1900) HEMODYNAMICS:   VENTILATOR SETTINGS: Vent Mode:  [-]  FiO2 (%):  [100 %] 100 % Set Rate:  [14 bmp] 14 bmp Vt Set:  [600 mL] 600 mL PEEP:  [5 cmH20] 5 cmH20 Plateau Pressure:  [20 cmH20] 20 cmH20 INTAKE / OUTPUT: Intake/Output     06/12 0701 - 06/13 0700 06/13 0701 - 06/14 0700   I.V. (mL/kg) 849.6 (10.9) 300 (3.9)   Total Intake(mL/kg) 849.6 (10.9) 300 (3.9)   Urine  (mL/kg/hr) 2975 350 (1.2)   Total Output 2975 350   Net -2125.4 -50          PHYSICAL EXAMINATION: General:  57 year old male, obtunded. Does not f/c. Right side flaccid  Neuro:  Opens eyes to noxious stim. Right side is flaccid. Left side appears purposeful at times. Has violent tremor on left  HEENT:  Orally intubated  Cardiovascular:  rrr Lungs:  Rhonchus respirations  Abdomen:  Soft, non-tender + bowel sounds  Musculoskeletal:  Right side flaccid  Skin:  Intact   LABS:  Recent Labs Lab 06/21/12 1940  HGB 14.3  WBC 9.0  PLT 291  NA 135  K 3.1*  CL 97  CO2 26  GLUCOSE 138*  BUN 24*  CREATININE 2.26*  CALCIUM 9.3  AST 29  ALT 19  ALKPHOS 69  BILITOT 0.5  PROT 8.3  ALBUMIN 4.1  APTT 29  INR 0.90  TROPONINI <0.30    Recent Labs Lab 06/21/12 1948  GLUCAP 153*    CXR: basilar atelectasis   ASSESSMENT / PLAN:  Principal Problem:   CVA (cerebral infarction) Active Problems:   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   Acute respiratory failure - intubated for AMS   Cerebral edema with midline shift   Acute renal insufficiency   Hypertensive crisis with hemorrhagic CVA   Seizure, possible   Difficult airway for intubation   Hypokalemia  NEUROLOGIC A:   CVA c/b ICH s/p TPA Cerebral edema P:   3% saline for edema as directed by Neuro  Serial neuro checks   PULMONARY A: Acute respiratory failure in setting of altered acute encephalopathy d/t ICH Difficult airway: required glide scope and bougie/ cords swollen required #7 P:   Full vent support F/u cxr Sedation protocol  CARDIOVASCULAR A: HTN crisis P:  SBP goal 140-160 in setting of ICH Nicardipine gtt   RENAL A:   Acute renal failure Hypokalemia   P:   Correct electrolytes as indicated Monitor BMET Avoid nephrotoxins  GASTROINTESTINAL A:  No acute issues  P:   Place FT Start nutrition   HEMATOLOGIC A:  Coagulopathy due to tPA, resolved  P:  Trend cbc SCDs  INFECTIOUS A:   No acute issue  P:   Trend fever curve Trend CBC  ENDOCRINE A:  Minimal hyperglycemia  P:   Monitor SSI if needed    TODAY'S SUMMARY:   I have personally obtained a history, examined the patient, evaluated laboratory and imaging results, formulated the assessment and plan and placed orders. CRITICAL CARE: The patient is critically ill with multiple organ systems failure and requires high complexity decision making for assessment and support, frequent evaluation and titration of therapies, application of advanced monitoring technologies and extensive interpretation of multiple databases. Critical Care Time devoted to patient care services described in this note is 35 minutes.    Pulmonary and Critical Care Medicine [facility]  Pager: [phone]  06/22/2012, 10:37 AM   ADD: cardiac markers positive. EKG reviewed. Pt not a candidate for direct intervention as he cannot be anticoagulated. Only possible therapy is stabilization of critical status and minimize physiological stress. Cards not called.  Billy Fischer, MD ; [facility] [phone].  After 5:30 PM or weekends, call [phone]

## 2012-06-22 NOTE — Progress Notes (Signed)
Nursing [phone] Patient back to room and RN made Dr. Pearlean Brownie aware of patient's CT results.  MD discussed results and plan of care with patient's family.  PA to order anti-seizure medications.  CCM consulted for medical management. Patient intubated and central line and arterial line placed by CCM team.  RN gave Fentanyl IVP, Versed 4mg  IVP, Etomidate 20mg  IVP, and Rocuronium 40mg  IVP per NP order during the procedures.  Vital signs monitored and documented in chart.

## 2012-06-22 NOTE — Progress Notes (Signed)
Nursing 1215 Dr. Pearlean Brownie made aware that patient is now having left sided tremors.  Ativan 1mg  IVP given per order.  Guy Franco, PA for stroke to bedside to assess patient.  Propofol resumed per PA due to patient coughing and continuing to have tremors. PA had discussion with family regarding plan of care. Will use Ativan IVP for tremors if needed.  Continue current plan of care.  PA aware of patient's neurological assessment, assessment done at bedside.

## 2012-06-22 NOTE — Progress Notes (Signed)
Nursing 1715 Dr. Marchelle Gearing made aware of patient's troponin level and temperature.  Will start cooling blanket for temp.

## 2012-06-22 NOTE — Procedures (Signed)
Intubation Procedure Note [name] [id number] Sep 12, 1955  Procedure: Intubation Indications: Airway protection and maintenance  Procedure Details Consent: Risks of procedure as well as the alternatives and risks of each were explained to the (patient/caregiver).  Consent for procedure obtained. Time Out: Verified patient identification, verified procedure, site/side was marked, verified correct patient position, special equipment/implants available, medications/allergies/relevent history reviewed, required imaging and test results available.  Performed  Maximum sterile technique was used including antiseptics, cap, gloves, hand hygiene and mask.  MAC attempted, could not get visualization  Required 4 Glide scope Very distal airway  Cords swollen  Only able to pass #7   Evaluation Hemodynamic Status: BP stable throughout; O2 sats: transiently fell during during procedure Patient's Current Condition: stable Complications: No apparent complications Patient did tolerate procedure well. Chest X-ray ordered to verify placement.  CXR: tube position acceptable.   BABCOCK,PETE 06/22/2012  I was present for and supervised the entire procedure  Billy Fischer, MD ; [facility] [phone].  After 5:30 PM or weekends, call [phone]

## 2012-06-22 NOTE — Progress Notes (Signed)
PT Cancellation/Discontinue Note  Patient Details Name: Deloris Mittag MRN: [id number] DOB: [birth year]   Cancelled Treatment:    Reason Eval/Treat Not Completed: Medical issues which prohibited therapy.  Patient with medical decline requiring intubation.  Please re-order PT when appropriate.  Thank you.   Vena Austria 06/22/2012, 11:22 AM Durenda Hurt Renaldo Fiddler, [facility] Acute Rehab Services Pager [phone]

## 2012-06-22 NOTE — Procedures (Signed)
Arterial Catheter Insertion Procedure Note [name] [id number] [birth year]  Procedure: Insertion of Arterial Catheter  Indications: Blood pressure monitoring and Frequent blood sampling  Procedure Details Consent: Risks of procedure as well as the alternatives and risks of each were explained to the (patient/caregiver).  Consent for procedure obtained. Time Out: Verified patient identification, verified procedure, site/side was marked, verified correct patient position, special equipment/implants available, medications/allergies/relevent history reviewed, required imaging and test results available.  Performed Real time [place] used to ID and cannulate the vessel  Maximum sterile technique was used including antiseptics, cap, gloves, gown, hand hygiene, mask and sheet. Skin prep: Chlorhexidine; local anesthetic administered 20 gauge catheter was inserted into right radial artery using the Seldinger technique.  Evaluation Blood flow good; BP tracing good. Complications: No apparent complications.   BABCOCK,PETE 06/22/2012  I was present for and supervised the entire procedure  Billy Fischer, MD ; [facility] [phone].  After 5:30 PM or weekends, call [phone]

## 2012-06-22 NOTE — Progress Notes (Signed)
Nursing 1130 Patient's BP decreasing. Anders Simmonds, NP for CCM made aware.  RN attempted decreasing and eventually stopping Propofol to increase BP to no effect.  NP aware and ordered NS 750cc bolus, bolus given as ordered.  BP responded well to IVFs.  Will continue to assess.  RN assessed EKG changes, order for labs and STAT EKG.  MD assessed EKG, no new orders at this time. Labs sent.

## 2012-06-22 NOTE — Progress Notes (Signed)
UR completed 

## 2012-06-22 NOTE — Progress Notes (Signed)
Stroke Team Progress Note  HISTORY  [name] is an 56 y.o. 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 No focal weakness was noted. Patient has been taking aspirin 81 mg per day. CT scan showed no acute intracranial abnormality. NIH stroke score was 8. Patient had marked expressive aphasia as well as gaze preference to the left side and right visual field defect to visual confrontation. He was deemed a candidate for intravenous thrombolytic therapy with TPA.  LSN: [id number] on 06/21/2012  tPA Given: Yes  MRankin: 2  SUBJECTIVE  Patient lying in bed. Left side of body with tonic-clonic type movements. No family in room.   OBJECTIVE Most recent Vital Signs: Filed Vitals:   06/22/12 0530 06/22/12 0600 06/22/12 0625 06/22/12 0700  BP: 181/88  172/104 190/92  Pulse: 97 93 108 90  Temp:      TempSrc:      Resp: 24 27 29 22   Weight:      SpO2: 98% 96% 96% 98%   CBG (last 3)   Recent Labs  06/21/12 1948  GLUCAP 153*    IV Fluid Intake:   . sodium chloride 75 mL/hr at 06/22/12 0700  . niCARDipine Stopped (06/22/12 0030)    MEDICATIONS  . pantoprazole (PROTONIX) IV  40 mg Intravenous QHS   PRN:  acetaminophen, acetaminophen, LORazepam, senna-docusate  Diet:  NPO   Activity:  Bedrest DVT Prophylaxis:  SCD  CLINICALLY SIGNIFICANT STUDIES Basic Metabolic Panel:  Recent Labs Lab 06/21/12 1940  NA 135  K 3.1*  CL 97  CO2 26  GLUCOSE 138*  BUN 24*  CREATININE 2.26*  CALCIUM 9.3   Liver Function Tests:  Recent Labs Lab 06/21/12 1940  AST 29  ALT 19  ALKPHOS 69  BILITOT 0.5  PROT 8.3  ALBUMIN 4.1   CBC:  Recent Labs Lab 06/21/12 1940  WBC 9.0  NEUTROABS 3.4  HGB  14.3  HCT 44.6  MCV 62.4*  PLT 291   Coagulation:  Recent Labs Lab 06/21/12 1940  LABPROT 12.1  INR 0.90   Cardiac Enzymes:  Recent Labs Lab 06/21/12 1940  TROPONINI <0.30   Urinalysis: No results found for this basename: COLORURINE, APPERANCEUR, LABSPEC, PHURINE, GLUCOSEU, HGBUR, BILIRUBINUR, KETONESUR, PROTEINUR, UROBILINOGEN, NITRITE, LEUKOCYTESUR,  in the last 168 hours Lipid Panel    Component Value Date/Time   CHOL 185 06/22/2012 0525   TRIG 92 06/22/2012 0525   HDL 58 06/22/2012 0525   CHOLHDL 3.2 06/22/2012 0525   VLDL 18 06/22/2012 0525   LDLCALC 109* 06/22/2012 0525   HgbA1C  Lab Results  Component Value Date   HGBA1C  Value: 5.8 (NOTE)   The ADA recommends the following therapeutic goals for glycemic   control related to Hgb A1C measurement:   Goal of Therapy:   < 7.0% Hgb A1C   Action Suggested:  > 8.0% Hgb A1C   Ref:  Diabetes Care, 22, Suppl. 1, 1999 05/17/2007    Urine Drug Screen:   No results found for this basename: labopia, cocainscrnur, labbenz, amphetmu, thcu, labbarb    Alcohol Level: No results found for this basename: ETH,  in the last 168 hours  Dg Chest 2 View  06/21/2012   *RADIOLOGY REPORT*  Clinical Data: Cerebral infarction.  CHEST -  2 VIEW  Comparison: 03/07/2011  Findings: Stable moderate cardiomegaly without evidence of pulmonary edema or pleural effusion.  No focal consolidation is identified in the lungs.  The bony thorax is unremarkable.  IMPRESSION: Moderate cardiomegaly.  No active disease.   Original Report Authenticated By: Irish Lack, M.D.   Ct Head Wo Contrast 06/21/2012   Chronic ischemic changes, right greater than left.  No acute abnormality.  06/22/2012 large area of hemorrhage in the left parietal lobe with mass effect and 10 mm midline shift. This is most consistent with hemorrhagic infarction following TPA.  MRI of the brain    MRA of the brain    2D Echocardiogram    Carotid Doppler    CXR  Tip of right jugular line  projects over SVC without pneumothorax. Bibasilar atelectasis.   EKG  normal sinus rhythm.   Therapy Recommendations   Physical Exam    . Afebrile. Head is nontraumatic. Neck is supple without bruit.  . Cardiac exam no murmur or gallop. Lungs are clear to auscultation. Distal pulses are well felt. Mental Status: Patient does not respond to verbal stimuli.  Does not respond to deep sternal rub.  Does not follow commands.  No verbalizations are noted.  Cranial Nerves: II: patient does not respond confrontation bilaterally, pupils right 3 mm (reactive), left 1 mm (nonreactive)  III,IV,VI: Spontaneous side-to-side eye movements present V,VII: corneal reflex reduced bilaterally  VIII: patient does not respond to verbal stimuli IX,X: gag reflex present,  XI: unable to test bilaterally due to coma XII: unable to test due to coma  Motor: RT sided Extremities flaccid throughout.     No purposeful movements noted. Intermittent tremulous nervous and posturing noted on the left side. Tone is significantly increased on the left compared to the right.  Sensory: Does not respond to noxious stimuli in any extremity.  Deep Tendon Reflexes:  Absent throughout.  Plantars: equivocal bilaterally  Cerebellar: Unable to perform due to coma  Gait: Unable to perform due to coma  ASSESSMENT [name] is a 57 y.o. male presenting with expressive aphasia with unintelligible speech output. He is status post IV t-PA full dosage at 2017. symptoms were consistent with left middle cerebral artery acute stroke.  Infarct felt to be embolic, workup underway.  On no antithrombotics prior to admission. Now on no antithrombitics post tPA as followup CT acutely repeated due to acute delirium, tonic-clonic seizure activity in left arm, decreasing consciousness. That scan 06/22/2012 shows that there is a large 6 cm  area of hemorrhage in the left parietal lobe with mass effect and 10 mm midline shift.  Patient was  subsequently intubated for airway protection. 3% saline started for cytotoxic edema.   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  Cerebral edema with midline shift, 3% Saline added  Coma  Hyperlipidemia  Chronic kidney disease, stage 3  Malignant Hypertension, on admission, now normotensive   Hospital day # 1  TREATMENT/PLAN  Continue no antithrombotics due to acute left parietal hemorrhage transformation for secondary stroke prevention.  CT scan in am or sooner if indicated  Continue Current sedation as blood pressure allows to goal of SBP 160. Patient is receiving Fluid bolus now.   I returned to the patients room due to call from nurse. Patient's left eye is non-reactive, pinpoint. Right eye reactive, 3mm, + corneals. Blood pressure rescusitation efforts. I discussed with Dr. Craige Cotta. Patient with  poor prognosis.  I spent 25 minutes alone with the wife and son. I reviewed the 2 CT scans with them and the possible outcomes (not limited to) death, trach, gastrostomy tube, paralysis. We discussed Code status; however at this time the patients family wants to keep him a FULL CODE. They want everything done knowing that he is on life support now. If he worsens today I will revisit with them, otherwise I did tell them that we will readdress this issue in the am of 06/23/2012.  Gwendolyn Lima. Manson Passey, [facility], MBA, MHA [facility] Pager: [phone] 06/22/2012 1:18 PM  I have personally obtained a history, examined the patient, evaluated imaging results, and formulated the assessment and plan of care. I agree with the above. This patient is critically ill and at significant risk of neurological worsening, death and care requires constant monitoring of vital signs, hemodynamics,respiratory and cardiac monitoring,review of multiple databases, neurological assessment, discussion with family, other  specialists and medical decision making of high complexity. I spent 50 minutes of neurocritical care time  in the care of  this patient. Delia Heady, MD

## 2012-06-22 NOTE — Progress Notes (Signed)
Nursing [phone]  Patient continuing to have moments of agitation and intermittent tremors on left side.  Larita Fife PA for Stroke on unit and asked to see patient and updated on his agitation and tremors. PA also made aware of patient's tachycardia and BP. Dr. Pearlean Brownie to bedside and ordered STAT CT head.  RN transferred patient to radiology for head CT.

## 2012-06-22 NOTE — Progress Notes (Signed)
INITIAL NUTRITION ASSESSMENT  DOCUMENTATION CODES Per approved criteria  -Not Applicable   INTERVENTION: Recommend initiating nutrition support.   Initiate Jevity 1.2 @ 20 ml/hr via OG tube and increase by 10 ml every 4 hours to goal rate of 45 ml/hr. 60 ml Prostat BID.  At goal rate, tube feeding regimen will provide 1696 kcal, 120 grams of protein, and 875 ml of H2O.   TF regimen and current propofol rate will provide 1880 calories (98% of needs)   NUTRITION DIAGNOSIS: Inadequate oral intake related to inability to eat as evidenced by NPO status.  Goal: Pt to meet >/= 90% of their estimated nutrition needs.   Monitor:  Vent status, TF initiation, weight trend, labs  Reason for Assessment: Ventilator   57 y.o. male  Admitting Dx: CVA (cerebral infarction)  ASSESSMENT: Pt with left MCA, received t-PA. CT on 6/13 showed new left ICH with midline shift.  Patient is currently intubated on ventilator support.  MV: 9.5 Temp:Temp (24hrs), Avg:99.5 F (37.5 C), Min:97.4 F (36.3 C), Max:101.1 F (38.4 C)  Propofol: 7 ml/hr providing 184 calories from lipid.    Height: Ht Readings from Last 1 Encounters:  06/22/12 5\' 11"  (1.803 m)    Weight: Wt Readings from Last 1 Encounters:  06/21/12 171 lb 11.8 oz (77.9 kg)    Ideal Body Weight: 78.1 kg  % Ideal Body Weight: 100%  Wt Readings from Last 10 Encounters:  06/21/12 171 lb 11.8 oz (77.9 kg)    Usual Body Weight: unknown  % Usual Body Weight: -  BMI:  Body mass index is 23.96 kg/(m^2).  Estimated Nutritional Needs: Kcal: 1909 Protein: 115-130 grams Fluid: > 2 L/day  Skin: no issues noted  Diet Order: NPO  EDUCATION NEEDS: -No education needs identified at this time   Intake/Output Summary (Last 24 hours) at 06/22/12 1551 Last data filed at 06/22/12 1500  Gross per 24 hour  Intake 2719.18 ml  Output   3930 ml  Net -1210.82 ml    Last BM: PTA   Labs:   Recent Labs Lab 06/21/12 1940  06/22/12 0901  NA 135 136  K 3.1*  --   CL 97  --   CO2 26  --   BUN 24*  --   CREATININE 2.26*  --   CALCIUM 9.3  --   GLUCOSE 138*  --     CBG (last 3)   Recent Labs  06/21/12 1948  GLUCAP 153*    Scheduled Meds: . pantoprazole (PROTONIX) IV  40 mg Intravenous QHS  . phenytoin (DILANTIN) IV  100 mg Intravenous Q8H    Continuous Infusions: . niCARDipine Stopped (06/22/12 1030)  . propofol 15 mcg/kg/min (06/22/12 1500)  . sodium chloride (hypertonic) 50 mL/hr at 06/22/12 1500    Past Medical History  Diagnosis Date  . Stroke 2007  . Hypertension   . Difficult airway for intubation 06/22/2012    Deep and anterior. Difficult with standard laryngoscope. Easily intubated with Glidescope     Past Surgical History  Procedure Laterality Date  . Coronary stent placement    . Cardiac surgery      Kendell Bane RD, LDN, CNSC [phone] Pager [phone] After Hours Pager

## 2012-06-22 NOTE — Progress Notes (Signed)
CRITICAL VALUE ALERT  Critical value received:  Troponin 7.64  Date of notification: 06/22/2012  Critical value read back:yes  Nurse who received alert:  Orpha Bur, RN  Responding MD:  Dr. Sung Amabile

## 2012-06-22 NOTE — Progress Notes (Signed)
CRITICAL VALUE ALERT  Critical value received:  Troponin 11.15  Date of notification:  06/22/2012  Time of notification:  RN read value in chart  Critical value read back:yes  Nurse who received alert:  Sammuel Cooper, RN  MD notified (1st page):  [email or web address]  Time of first page:  1715  MD notified (2nd page):  Time of second page:  Responding MD:  Marchelle Gearing  Time MD responded:  1715

## 2012-06-22 NOTE — Progress Notes (Signed)
eLink Physician-Brief Progress Note Patient Name: [name] DOB: December 05, 1955 MRN: [id number]  Date of Service  06/22/2012   HPI/Events of Note  Temp 102F   eICU Interventions  cololing blanket   Intervention Category Minor Interventions: Other:  Alayha Babineaux 06/22/2012, 5:19 PM

## 2012-06-22 NOTE — Progress Notes (Addendum)
Repeat Head CT reviewed with Dr. Roseanne Reno who conveys that shift 10mm down to 8mm and felt no neurosurgical consult needed. Fever addressed by CCM.  Gwendolyn Lima. Manson Passey, [facility], MBA, MHA [facility] Pager: [phone] 06/22/2012 5:24 PM

## 2012-06-23 ENCOUNTER — Inpatient Hospital Stay (HOSPITAL_COMMUNITY): Payer: Medicare Other

## 2012-06-23 ENCOUNTER — Encounter (HOSPITAL_COMMUNITY): Payer: Self-pay | Admitting: Neurology

## 2012-06-23 ENCOUNTER — Other Ambulatory Visit: Payer: Self-pay

## 2012-06-23 DIAGNOSIS — I6350 Cerebral infarction due to unspecified occlusion or stenosis of unspecified cerebral artery: Secondary | ICD-10-CM

## 2012-06-23 DIAGNOSIS — J9600 Acute respiratory failure, unspecified whether with hypoxia or hypercapnia: Secondary | ICD-10-CM

## 2012-06-23 LAB — BASIC METABOLIC PANEL
BUN: 20 mg/dL (ref 6–23)
BUN: 19 mg/dL (ref 6–23)
Chloride: 112 mEq/L (ref 96–112)
Chloride: 117 mEq/L — ABNORMAL HIGH (ref 96–112)
GFR calc Af Amer: 47 mL/min — ABNORMAL LOW (ref >90)
GFR calc Af Amer: 42 mL/min — ABNORMAL LOW (ref >90)
GFR calc non Af Amer: 41 mL/min — ABNORMAL LOW (ref >90)
GFR calc non Af Amer: 37 mL/min — ABNORMAL LOW (ref >90)
Potassium: 3.7 mEq/L (ref 3.5–5.1)
Potassium: 4.4 mEq/L (ref 3.5–5.1)
Sodium: 145 mEq/L (ref 135–145)

## 2012-06-23 LAB — CBC
HCT: 38.3 % — ABNORMAL LOW (ref 39.0–52.0)
Hemoglobin: 12.3 g/dL — ABNORMAL LOW (ref 13.0–17.0)
RBC: 6.14 MIL/uL — ABNORMAL HIGH (ref 4.22–5.81)
WBC: 13.4 10*3/uL — ABNORMAL HIGH (ref 4.0–10.5)

## 2012-06-23 LAB — GLUCOSE, CAPILLARY
Glucose-Capillary: 113 mg/dL — ABNORMAL HIGH (ref 70–99)
Glucose-Capillary: 138 mg/dL — ABNORMAL HIGH (ref 70–99)

## 2012-06-23 LAB — SODIUM
Sodium: 147 mEq/L — ABNORMAL HIGH (ref 135–145)
Sodium: 151 mEq/L — ABNORMAL HIGH (ref 135–145)

## 2012-06-23 MED ORDER — POTASSIUM CHLORIDE 20 MEQ/15ML (10%) PO LIQD
40.0000 meq | Freq: Once | ORAL | Status: AC
  Administered 2012-06-23: 40 meq via ORAL
  Filled 2012-06-23: qty 30

## 2012-06-23 MED ORDER — ATORVASTATIN CALCIUM 20 MG PO TABS
20.0000 mg | ORAL_TABLET | Freq: Every day | ORAL | Status: DC
  Administered 2012-06-23 – 2012-07-05 (×13): 20 mg via ORAL
  Filled 2012-06-23 (×14): qty 1

## 2012-06-23 MED ORDER — JEVITY 1.2 CAL PO LIQD
1000.0000 mL | ORAL | Status: DC
  Administered 2012-06-23: 1000 mL
  Administered 2012-06-24: 21:00:00
  Filled 2012-06-23 (×5): qty 1000

## 2012-06-23 MED ORDER — FUROSEMIDE 10 MG/ML IJ SOLN
40.0000 mg | Freq: Once | INTRAMUSCULAR | Status: AC
  Administered 2012-06-23: 40 mg via INTRAVENOUS
  Filled 2012-06-23: qty 4

## 2012-06-23 MED ORDER — PRO-STAT SUGAR FREE PO LIQD
60.0000 mL | Freq: Two times a day (BID) | ORAL | Status: DC
  Administered 2012-06-23: 60 mL
  Administered 2012-06-23: 30 mL
  Administered 2012-06-24 – 2012-06-25 (×3): 60 mL
  Filled 2012-06-23 (×6): qty 60

## 2012-06-23 MED ORDER — SODIUM CHLORIDE 3 % IV SOLN
INTRAVENOUS | Status: DC
  Administered 2012-06-24: 40 mL/h via INTRAVENOUS
  Administered 2012-06-24: 20:00:00 via INTRAVENOUS
  Filled 2012-06-23 (×10): qty 500

## 2012-06-23 MED ORDER — ARTIFICIAL TEARS OP OINT
TOPICAL_OINTMENT | OPHTHALMIC | Status: DC | PRN
  Administered 2012-06-24 – 2012-07-17 (×5): via OPHTHALMIC
  Filled 2012-06-23 (×2): qty 3.5

## 2012-06-23 MED ORDER — METOPROLOL TARTRATE 12.5 MG HALF TABLET
12.5000 mg | ORAL_TABLET | Freq: Four times a day (QID) | ORAL | Status: DC
  Administered 2012-06-23 – 2012-06-29 (×24): 12.5 mg via ORAL
  Filled 2012-06-23 (×28): qty 1

## 2012-06-23 NOTE — Progress Notes (Addendum)
NUTRITION FOLLOW UP  DOCUMENTATION CODES Per approved criteria  -Not Applicable   INTERVENTION: - Will start TF via OG tube of Jevity 1.2 start at 57ml/hr increase by 10ml every 4 hours goal of 42ml/hr with Prostat 60ml BID. At goal rate, TF plus calories from Propofol will provide 2374 calories, 140g protein, free water. This will meet 103% estimated calorie needs, 122% estimated protein needs. If IVF d/c, recommend water flushes 6 times/day.  - Will initiate adult enteral protocol - Unit RD to continue to monitor     NUTRITION DIAGNOSIS: Inadequate oral intake related to inability to eat as evidenced by NPO status.  Goal: Pt to meet >/= 90% of their estimated nutrition needs.   Monitor:  Vent status, TF tolerance, weight trend, labs  Reason for Assessment: Consult for TF management   56 y.o. male  Admitting Dx: CVA (cerebral infarction)  ASSESSMENT: Pt with left MCA, received t-PA. CT on 6/13 showed new left ICH with midline shift.  Patient is currently intubated on ventilator support.  MV: 15 L/min Temp:Temp (24hrs), Avg:100.8 F (38.2 C), Min:98.5 F (36.9 C), Max:102 F (38.9 C)  Propofol: 9.3 ml/hr providing 246 calories from lipid.    Height: Ht Readings from Last 1 Encounters:  06/22/12 5\' 11"  (1.803 m)    Weight: Wt Readings from Last 1 Encounters:  06/21/12 171 lb 11.8 oz (77.9 kg)    Ideal Body Weight: 78.1 kg  % Ideal Body Weight: 100%  Wt Readings from Last 10 Encounters:  06/21/12 171 lb 11.8 oz (77.9 kg)    Usual Body Weight: unknown  % Usual Body Weight: -  BMI:  Body mass index is 23.96 kg/(m^2).  Estimated Nutritional Needs: Kcal: 2310 Protein: 115-130 grams Fluid: > 2 L/day  Skin: no issues noted  Diet Order: NPO  EDUCATION NEEDS: -No education needs identified at this time   Intake/Output Summary (Last 24 hours) at 06/23/12 1123 Last data filed at 06/23/12 1000  Gross per 24 hour  Intake 2742.72 ml   Output   3102 ml  Net -359.28 ml    Last BM: PTA   Labs:   Recent Labs Lab 06/21/12 1940  06/22/12 2101 06/23/12 0301 06/23/12 0915  NA 135  < > 143 145 147*  K 3.1*  --   --  3.7  --   CL 97  --   --  112  --   CO2 26  --   --  21  --   BUN 24*  --   --  20  --   CREATININE 2.26*  --   --  1.78*  --   CALCIUM 9.3  --   --  7.4*  --   GLUCOSE 138*  --   --  157*  --   < > = values in this interval not displayed.  CBG (last 3)   Recent Labs  06/21/12 1948  GLUCAP 153*    Scheduled Meds: . antiseptic oral rinse  15 mL Mouth Rinse QID  . atorvastatin  20 mg Oral q1800  . chlorhexidine  15 mL Mouth Rinse BID  . furosemide  40 mg Intravenous Once  . metoprolol tartrate  12.5 mg Oral Q6H  . pantoprazole (PROTONIX) IV  40 mg Intravenous QHS  . phenytoin (DILANTIN) IV  100 mg Intravenous Q8H  . potassium chloride  40 mEq Oral Once    Continuous Infusions: . niCARDipine Stopped (06/22/12 1030)  . propofol 20 mcg/kg/min (06/23/12  1000)  . sodium chloride (hypertonic) 75 mL/hr (06/23/12 1019)    Past Medical History  Diagnosis Date  . Stroke 2007  . Hypertension   . Difficult airway for intubation 06/22/2012    Deep and anterior. Difficult with standard laryngoscope. Easily intubated with Glidescope   . Coronary artery disease     Stent placement    Past Surgical History  Procedure Laterality Date  . Coronary stent placement    . Cardiac surgery      Levon Hedger MS, RD, LDN [phone] Weekend/After Hours Pager

## 2012-06-23 NOTE — Progress Notes (Signed)
Stroke Team Progress Note  HISTORY  [name] is an 57 y.o. 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 No focal weakness was noted. Patient has been taking aspirin 81 mg per day. CT scan showed no acute intracranial abnormality. NIH stroke score was 8. Patient had marked expressive aphasia as well as gaze preference to the left side and right visual field defect to visual confrontation. He was deemed a candidate for intravenous thrombolytic therapy with TPA.  LSN: [id number] on 06/21/2012  tPA Given: Yes  MRankin: 2  SUBJECTIVE  Patient lying in bed. Intubated and sedated   OBJECTIVE Most recent Vital Signs: Filed Vitals:   06/23/12 0500 06/23/12 0600 06/23/12 0700 06/23/12 0735  BP:  139/68 143/73   Pulse: 70 65 66   Temp:  99.6 F (37.6 C)  99.4 F (37.4 C)  TempSrc:  Rectal  Axillary  Resp: 18 14 14    Height:      Weight:      SpO2: 100% 100% 100%    CBG (last 3)   Recent Labs  06/21/12 1948  GLUCAP 153*    IV Fluid Intake:   . niCARDipine Stopped (06/22/12 1030)  . propofol 20 mcg/kg/min (06/23/12 0700)  . sodium chloride (hypertonic) 75 mL/hr at 06/23/12 0304    MEDICATIONS  . antiseptic oral rinse  15 mL Mouth Rinse QID  . chlorhexidine  15 mL Mouth Rinse BID  . pantoprazole (PROTONIX) IV  40 mg Intravenous QHS  . phenytoin (DILANTIN) IV  100 mg Intravenous Q8H   PRN:  acetaminophen, acetaminophen, fentaNYL, LORazepam, metoprolol, senna-docusate  Diet:  NPO   Activity:  Bedrest DVT Prophylaxis:  SCD  CLINICALLY SIGNIFICANT STUDIES Basic Metabolic Panel:  Recent Labs Lab 06/21/12 1940  06/22/12 2101 06/23/12 0301  NA 135  < > 143 145  K 3.1*  --   --  3.7   CL 97  --   --  112  CO2 26  --   --  21  GLUCOSE 138*  --   --  157*  BUN 24*  --   --  20  CREATININE 2.26*  --   --  1.78*  CALCIUM 9.3  --   --  7.4*  < > = values in this interval not displayed. Liver Function Tests:   Recent Labs Lab 06/21/12 1940  AST 29  ALT 19  ALKPHOS 69  BILITOT 0.5  PROT 8.3  ALBUMIN 4.1   CBC:   Recent Labs Lab 06/21/12 1940 06/23/12 0301  WBC 9.0 13.4*  NEUTROABS 3.4  --   HGB 14.3 12.3*  HCT 44.6 38.3*  MCV 62.4* 62.4*  PLT 291 218   Coagulation:   Recent Labs Lab 06/21/12 1940  LABPROT 12.1  INR 0.90   Cardiac Enzymes:   Recent Labs Lab 06/22/12 1144 06/22/12 1630 06/22/12 2216  TROPONINI 7.64* 11.15* 10.83*   Urinalysis: No results found for this basename: COLORURINE, APPERANCEUR, LABSPEC, PHURINE, GLUCOSEU, HGBUR, BILIRUBINUR, KETONESUR, PROTEINUR, UROBILINOGEN, NITRITE, LEUKOCYTESUR,  in the last 168 hours Lipid Panel    Component Value Date/Time   CHOL 185 06/22/2012 0525   TRIG 92 06/22/2012 0525   HDL 58 06/22/2012 0525   CHOLHDL 3.2 06/22/2012 0525   VLDL 18 06/22/2012  0525   LDLCALC 109* 06/22/2012 0525   HgbA1C  Lab Results  Component Value Date   HGBA1C 6.0* 06/22/2012    Urine Drug Screen:   No results found for this basename: labopia,  cocainscrnur,  labbenz,  amphetmu,  thcu,  labbarb    Alcohol Level: No results found for this basename: ETH,  in the last 168 hours  Dg Chest 2 View  06/21/2012   *RADIOLOGY REPORT*  Clinical Data: Cerebral infarction.  CHEST - 2 VIEW  Comparison: 03/07/2011  Findings: Stable moderate cardiomegaly without evidence of pulmonary edema or pleural effusion.  No focal consolidation is identified in the lungs.  The bony thorax is unremarkable.  IMPRESSION: Moderate cardiomegaly.  No active disease.   Original Report Authenticated By: Irish Lack, M.D.   Ct Head Wo Contrast 06/21/2012   Chronic ischemic changes, right greater than left.  No acute abnormality.  06/22/2012  large area of hemorrhage in the left parietal lobe with mass effect and 10 mm midline shift. This is most consistent with hemorrhagic infarction following TPA.  MRI of the brain    MRA of the brain    2D Echocardiogram  pending  Carotid Doppler  pending  CXR  Tip of right jugular line projects over SVC without pneumothorax. Bibasilar atelectasis.   EKG  normal sinus rhythm.   Therapy Recommendations   Physical Exam  General: The patient is intubated, sedated at the time of the examination.  Respiratory: Lung fields are clear.  Abdomen: Abdomen is soft, nontender.  Skin: No significant peripheral edema is noted.   Neurologic Exam  Cranial nerves: Facial symmetry is present. The patient has a gaze preference to the left. The patient is not blink to threat. The eyes cannot be dolled across midline.  Motor: Motor tone is increased on all 4 extremities, left greater than right. The patient will occasionally have spontaneous movement of the left upper extremity, apparently localizing. With sternal rub, it appears to be extension of both legs.   Coordination: patient could not cooperate for cerebellar testing.   Gait and station: The patient could not be ambulated.   Reflexes: Deep tendon reflexes are symmetric, but are somewhat brisk in the legs. Toes are neutral bilaterally.    ASSESSMENT [name] is a 57 y.o. male presenting with expressive aphasia with unintelligible speech output. He is status post IV t-PA full dosage at 2017. symptoms were consistent with left middle cerebral artery acute stroke.  Infarct felt to be embolic, workup underway.  On no antithrombotics prior to admission. Now on no antithrombitics post tPA as followup CT acutely repeated due to acute delirium, tonic-clonic seizure activity in left arm, decreasing consciousness. That scan 06/22/2012 shows that there is a large 6 cm  area of hemorrhage in the left parietal lobe with mass effect and 10 mm  midline shift.  Patient was subsequently intubated for airway protection. 3% saline started for cytotoxic edema.   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  Cerebral edema with midline shift, 3% Saline added  Coma  Hyperlipidemia  Chronic kidney disease, stage 3  Malignant Hypertension, on admission, now normotensive   troponin I elevation 11.15 peak   Hospital day # 2  TREATMENT/PLAN  Continue no antithrombotics due to acute left parietal hemorrhage transformation for secondary stroke prevention.  CT scan in am or sooner if indicated  Continue Current sedation as blood pressure allows  to goal of SBP 160. Patient is receiving Fluid bolus now.   Cardiology evaluation, the patient has been seen by Dr.  Sharyn Lull in the past according to the wife   Consider tube feeds  beginning tomorrow.  Supportive care, critical care medicine following   Lesly Dukes  06/23/2012 7:44 AM

## 2012-06-23 NOTE — Consult Note (Signed)
[name] is an 56 y.o. male.   Chief Complaint: Abnormal cardiac enzymes.  HPI: 52 years olf male with possible embolic stroke had IV TPA followed by hemorrhagic stroke with mass effect. Now his Troponin I had increased to 11.15, now 10.83. Patient also has renal dysfunction and EKG changes of LVH with strain and possible ischemia. 2-D echo has been pending. Patient unable to give history.  Past Medical History  Diagnosis Date  . Stroke 2007  . Hypertension   . Difficult airway for intubation 06/22/2012    Antonio and anterior. Difficult with standard laryngoscope. Easily intubated with Glidescope   . Coronary artery disease     Stent placement      Past Surgical History  Procedure Laterality Date  . Coronary stent placement    . Cardiac surgery      History reviewed. No pertinent family history. Social History:  reports that he has never smoked. He has never used smokeless tobacco. He reports that he does not drink alcohol. His drug history is not on file.  Allergies: No Known Allergies  Medications Prior to Admission  Medication Sig Dispense Refill  . atenolol (TENORMIN) 25 MG tablet Take 25 mg by mouth daily.      . hydrochlorothiazide (HYDRODIURIL) 25 MG tablet Take 25 mg by mouth daily.      Marland Kitchen losartan (COZAAR) 100 MG tablet Take 100 mg by mouth daily.      . sildenafil (VIAGRA) 100 MG tablet Take 100 mg by mouth daily as needed for erectile dysfunction.        Results for orders placed during the hospital encounter of 06/21/12 (from the past 48 hour(s))  PROTIME-INR     Status: None   Collection Time    06/21/12  7:40 PM      Result Value Range   Prothrombin Time 12.1  11.6 - 15.2 seconds   INR 0.90  0.00 - 1.49  APTT     Status: None   Collection Time    06/21/12  7:40 PM      Result Value Range   aPTT 29  24 - 37 seconds  CBC     Status: Abnormal   Collection Time    06/21/12  7:40 PM      Result Value Range   WBC 9.0  4.0 - 10.5 K/uL   RBC 7.15 (*) 4.22 -  5.81 MIL/uL   Hemoglobin 14.3  13.0 - 17.0 g/dL   HCT 16.1  09.6 - 04.5 %   MCV 62.4 (*) 78.0 - 100.0 fL   MCH 20.0 (*) 26.0 - 34.0 pg   MCHC 32.1  30.0 - 36.0 g/dL   RDW 40.9 (*) 81.1 - 91.4 %   Platelets 291  150 - 400 K/uL  DIFFERENTIAL     Status: Abnormal   Collection Time    06/21/12  7:40 PM      Result Value Range   Neutrophils Relative % 38 (*) 43 - 77 %   Lymphocytes Relative 47 (*) 12 - 46 %   Monocytes Relative 11  3 - 12 %   Eosinophils Relative 3  0 - 5 %   Basophils Relative 1  0 - 1 %   Neutro Abs 3.4  1.7 - 7.7 K/uL   Lymphs Abs 4.2 (*) 0.7 - 4.0 K/uL   Monocytes Absolute 1.0  0.1 - 1.0 K/uL   Eosinophils Absolute 0.3  0.0 - 0.7 K/uL   Basophils Absolute 0.1  0.0 - 0.1 K/uL   RBC Morphology POLYCHROMASIA PRESENT     Comment: TARGET CELLS  COMPREHENSIVE METABOLIC PANEL     Status: Abnormal   Collection Time    06/21/12  7:40 PM      Result Value Range   Sodium 135  135 - 145 mEq/L   Potassium 3.1 (*) 3.5 - 5.1 mEq/L   Chloride 97  96 - 112 mEq/L   CO2 26  19 - 32 mEq/L   Glucose, Bld 138 (*) 70 - 99 mg/dL   BUN 24 (*) 6 - 23 mg/dL   Creatinine, Ser 1.61 (*) 0.50 - 1.35 mg/dL   Calcium 9.3  8.4 - 09.6 mg/dL   Total Protein 8.3  6.0 - 8.3 g/dL   Albumin 4.1  3.5 - 5.2 g/dL   AST 29  0 - 37 U/L   ALT 19  0 - 53 U/L   Alkaline Phosphatase 69  39 - 117 U/L   Total Bilirubin 0.5  0.3 - 1.2 mg/dL   GFR calc non Af Amer 30 (*) >90 mL/min   GFR calc Af Amer 35 (*) >90 mL/min   Comment:            The eGFR has been calculated     using the CKD EPI equation.     This calculation has not been     validated in all clinical     situations.     eGFR's persistently     <90 mL/min signify     possible Chronic Kidney Disease.  TROPONIN I     Status: None   Collection Time    06/21/12  7:40 PM      Result Value Range   Troponin I <0.30  <0.30 ng/mL   Comment:            Due to the release kinetics of cTnI,     a negative result within the first hours     of  the onset of symptoms does not rule out     myocardial infarction with certainty.     If myocardial infarction is still suspected,     repeat the test at appropriate intervals.  POCT I-STAT TROPONIN I     Status: None   Collection Time    06/21/12  7:47 PM      Result Value Range   Troponin i, poc 0.02  0.00 - 0.08 ng/mL   Comment 3            Comment: Due to the release kinetics of cTnI,     a negative result within the first hours     of the onset of symptoms does not rule out     myocardial infarction with certainty.     If myocardial infarction is still suspected,     repeat the test at appropriate intervals.  GLUCOSE, CAPILLARY     Status: Abnormal   Collection Time    06/21/12  7:48 PM      Result Value Range   Glucose-Capillary 153 (*) 70 - 99 mg/dL   Comment 1 Notify RN     Comment 2 Documented in Chart    MRSA PCR SCREENING     Status: None   Collection Time    06/21/12  9:46 PM      Result Value Range   MRSA by PCR NEGATIVE  NEGATIVE   Comment:  The GeneXpert MRSA Assay (FDA     approved for NASAL specimens     only), is one component of a     comprehensive MRSA colonization     surveillance program. It is not     intended to diagnose MRSA     infection nor to guide or     monitor treatment for     MRSA infections.  HEMOGLOBIN A1C     Status: Abnormal   Collection Time    06/22/12  5:25 AM      Result Value Range   Hemoglobin A1C 6.0 (*) <5.7 %   Comment: (NOTE)                                                                               According to the ADA Clinical Practice Recommendations for 2011, when     HbA1c is used as a screening test:      >=6.5%   Diagnostic of Diabetes Mellitus               (if abnormal result is confirmed)     5.7-6.4%   Increased risk of developing Diabetes Mellitus     References:Diagnosis and Classification of Diabetes Mellitus,Diabetes     Care,2011,34(Suppl 1):S62-S69 and Standards of Medical Care in              Diabetes - 2011,Diabetes Care,2011,34 (Suppl 1):S11-S61.   Mean Plasma Glucose 126 (*) <117 mg/dL  LIPID PANEL     Status: Abnormal   Collection Time    06/22/12  5:25 AM      Result Value Range   Cholesterol 185  0 - 200 mg/dL   Triglycerides 92  <161 mg/dL   HDL 58  >09 mg/dL   Total CHOL/HDL Ratio 3.2     VLDL 18  0 - 40 mg/dL   LDL Cholesterol 604 (*) 0 - 99 mg/dL   Comment:            Total Cholesterol/HDL:CHD Risk     Coronary Heart Disease Risk Table                         Men   Women      1/2 Average Risk   3.4   3.3      Average Risk       5.0   4.4      2 X Average Risk   9.6   7.1      3 X Average Risk  23.4   11.0                Use the calculated Patient Ratio     above and the CHD Risk Table     to determine the patient's CHD Risk.                ATP III CLASSIFICATION (LDL):      <100     mg/dL   Optimal      540-981  mg/dL   Near or Above  Optimal      130-159  mg/dL   Borderline      161-096  mg/dL   High      >045     mg/dL   Very High  SODIUM     Status: None   Collection Time    06/22/12  9:01 AM      Result Value Range   Sodium 136  135 - 145 mEq/L  TROPONIN I     Status: Abnormal   Collection Time    06/22/12 11:44 AM      Result Value Range   Troponin I 7.64 (*) <0.30 ng/mL   Comment:            Due to the release kinetics of cTnI,     a negative result within the first hours     of the onset of symptoms does not rule out     myocardial infarction with certainty.     If myocardial infarction is still suspected,     repeat the test at appropriate intervals.     CRITICAL RESULT CALLED TO, READ BACK BY AND VERIFIED WITH:     K WEDDLE,RN 1401 06/22/12 D BRADLEY  BLOOD GAS, ARTERIAL     Status: Abnormal   Collection Time    06/22/12  1:45 PM      Result Value Range   FIO2 1.00     Delivery systems VENTILATOR     Mode PRESSURE REGULATED VOLUME CONTROL     VT 600     Rate 14     Peep/cpap 5.0     pH, Arterial  7.425  7.350 - 7.450   pCO2 arterial 34.7 (*) 35.0 - 45.0 mmHg   pO2, Arterial 421.0 (*) 80.0 - 100.0 mmHg   Bicarbonate 22.3  20.0 - 24.0 mEq/L   TCO2 23.4  0 - 100 mmol/L   Acid-base deficit 1.4  0.0 - 2.0 mmol/L   O2 Saturation 100.0     Patient temperature 98.6     Collection site A-LINE     Drawn by [phone]     Sample type ARTERIAL DRAW     Allens test (pass/fail) PASS  PASS  SODIUM     Status: None   Collection Time    06/22/12  4:30 PM      Result Value Range   Sodium 141  135 - 145 mEq/L  TROPONIN I     Status: Abnormal   Collection Time    06/22/12  4:30 PM      Result Value Range   Troponin I 11.15 (*) <0.30 ng/mL   Comment:            Due to the release kinetics of cTnI,     a negative result within the first hours     of the onset of symptoms does not rule out     myocardial infarction with certainty.     If myocardial infarction is still suspected,     repeat the test at appropriate intervals.     CRITICAL VALUE NOTED.  VALUE IS CONSISTENT WITH PREVIOUSLY REPORTED AND CALLED VALUE.  SODIUM     Status: None   Collection Time    06/22/12  9:01 PM      Result Value Range   Sodium 143  135 - 145 mEq/L  TROPONIN I     Status: Abnormal   Collection Time    06/22/12 10:16 PM      Result  Value Range   Troponin I 10.83 (*) <0.30 ng/mL   Comment:            Due to the release kinetics of cTnI,     a negative result within the first hours     of the onset of symptoms does not rule out     myocardial infarction with certainty.     If myocardial infarction is still suspected,     repeat the test at appropriate intervals.     CRITICAL VALUE NOTED.  VALUE IS CONSISTENT WITH PREVIOUSLY REPORTED AND CALLED VALUE.  BASIC METABOLIC PANEL     Status: Abnormal   Collection Time    06/23/12  3:01 AM      Result Value Range   Sodium 145  135 - 145 mEq/L   Potassium 3.7  3.5 - 5.1 mEq/L   Chloride 112  96 - 112 mEq/L   CO2 21  19 - 32 mEq/L   Glucose, Bld 157 (*) 70 - 99  mg/dL   BUN 20  6 - 23 mg/dL   Creatinine, Ser 4.54 (*) 0.50 - 1.35 mg/dL   Calcium 7.4 (*) 8.4 - 10.5 mg/dL   GFR calc non Af Amer 41 (*) >90 mL/min   GFR calc Af Amer 47 (*) >90 mL/min   Comment:            The eGFR has been calculated     using the CKD EPI equation.     This calculation has not been     validated in all clinical     situations.     eGFR's persistently     <90 mL/min signify     possible Chronic Kidney Disease.  CBC     Status: Abnormal   Collection Time    06/23/12  3:01 AM      Result Value Range   WBC 13.4 (*) 4.0 - 10.5 K/uL   RBC 6.14 (*) 4.22 - 5.81 MIL/uL   Hemoglobin 12.3 (*) 13.0 - 17.0 g/dL   HCT 09.8 (*) 11.9 - 14.7 %   MCV 62.4 (*) 78.0 - 100.0 fL   MCH 20.0 (*) 26.0 - 34.0 pg   MCHC 32.1  30.0 - 36.0 g/dL   RDW 82.9 (*) 56.2 - 13.0 %   Platelets 218  150 - 400 K/uL   Comment: DELTA CHECK NOTED   Dg Chest 2 View  06/21/2012   *RADIOLOGY REPORT*  Clinical Data: Cerebral infarction.  CHEST - 2 VIEW  Comparison: 03/07/2011  Findings: Stable moderate cardiomegaly without evidence of pulmonary edema or pleural effusion.  No focal consolidation is identified in the lungs.  The bony thorax is unremarkable.  IMPRESSION: Moderate cardiomegaly.  No active disease.   Original Report Authenticated By: Irish Lack, M.D.   Ct Head Wo Contrast  06/23/2012   *RADIOLOGY REPORT*  Clinical Data: Follow up CVA and intracranial hemorrhage.  CT HEAD WITHOUT CONTRAST  Technique:  Contiguous axial images were obtained from the base of the skull through the vertex without contrast.  Comparison: 06/22/2012 and 06/21/2012  Findings: Again noted is a large area of hemorrhagic infarction primarily involving the left parietal lobe.  The hemorrhage roughly measures 7.8 x 4.5 x 6.5 cm.  Hemorrhage previously measured 7.2 x 5.1 x 6.5 cm.  Overall, there has been minimal change in the size of the hemorrhage.  Patchy low density areas in the left frontal lobe likely represent  additional areas of infarction.  There  is approximately 8 mm of left to right midline shift which has not significantly changed.  Again noted is effacement of the left lateral ventricle occipital horn.  Hemorrhage extends towards the midline.  Again noted is diffuse low density throughout the white matter consistent with chronic changes.  No acute bony abnormality.  IMPRESSION: Minimal change in the large left hemorrhagic infarct.  Minimal change in the midline shift.   Original Report Authenticated By: Richarda Overlie, M.D.   Ct Head Wo Contrast  06/22/2012   **ADDENDUM** CREATED: 06/22/2012 08:58:34  Critical Value/emergent results were called by telephone at the time of interpretation on 06/22/2012 at 0858 hours to Dr.  Pearlean Brownie, who verbally acknowledged these results.  **END ADDENDUM** SIGNED BY: Dineen Kid. Chestine Spore, M.D.  06/22/2012   *RADIOLOGY REPORT*  Clinical Data: Mental status change.  Received TPA  CT HEAD WITHOUT CONTRAST  Technique:  Contiguous axial images were obtained from the base of the skull through the vertex without contrast.  Comparison: 06/21/2012  Findings: The patient was not able to hold still with  extensive motion on the study degrading image quality.  There is acute hemorrhage in the left parietal lobe.  This is a moderately large hematoma measuring approximately 6 cm in diameter. There is surrounding low density edema/infarct.  There is hemorrhage in the body of the caudate on the left.  There is mass effect on the left lateral ventricle.  There are 10 mm midline shift to the right.  There is early dilatation of the right lateral ventricle which shows early signs of entrapment.  Chronic microvascular ischemic changes are present in the right frontal white matter and basal ganglia, unchanged.  IMPRESSION: Image quality degraded by significant motion.  There is a large area of hemorrhage in the left parietal lobe with mass effect and 10 mm midline shift.  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   Original Report Authenticated By: Janeece Riggers, M.D.   Ct Head Wo Contrast  06/21/2012   *RADIOLOGY REPORT*  Clinical Data: Code stroke  CT HEAD WITHOUT CONTRAST  Technique:  Contiguous axial images were obtained from the base of the skull through the vertex without contrast.  Comparison: CT 05/21/2007  Findings: Chronic ischemic changes in the white matter, right greater than left.  Chronic infarct in the right putamen and internal capsule extending into the white matter is unchanged.  No acute infarct.  No acute hemorrhage or mass.  No change from the prior study.  IMPRESSION: Chronic ischemic changes, right greater than left.  No acute abnormality.  Critical Value/emergent results were called by telephone at the time of interpretation on 06/21/2012 at 1950 hours to Dr. Roseanne Reno, who verbally acknowledged these results.   Original Report Authenticated By: Janeece Riggers, M.D.   Dg Chest Port 1 View  06/23/2012   *RADIOLOGY REPORT*  Clinical Data: Respiratory failure.  PORTABLE CHEST - 1 VIEW  Comparison: The previous day's exam  Findings: Nasogastric tube has been placed at least as far as the stomach, tip not seen.  Endotracheal tube and right IJ central line are stable in position.  Stable cardiomegaly.  Some increase in perihilar and bibasilar interstitial edema or infiltrates.  No definite effusion.  IMPRESSION: 1.  Nasogastric tube placement least as far as the stomach. 2.  Interval increase in bilateral edema or infiltrates.   Original Report Authenticated By: D. Andria Rhein, MD   Dg Chest Port 1 View  06/22/2012   *RADIOLOGY REPORT*  Clinical Data:  Central line placement  PORTABLE CHEST - 1 VIEW  Comparison: Portable exam 1019 hours compared to 06/21/2012  Findings: Tip of endotracheal tube projects 4.0 cm above carina. Right jugular central venous catheter tip projects over SVC. Mild enlargement of cardiac silhouette. Mediastinal contours and pulmonary vascularity normal.  Mild bibasilar atelectasis. Lungs otherwise clear. No pleural effusion or pneumothorax.  IMPRESSION: Tip of right jugular line projects over SVC without pneumothorax. Bibasilar atelectasis.   Original Report Authenticated By: Ulyses Southward, M.D.   Dg Abd Portable 1v  06/22/2012   *RADIOLOGY REPORT*  Clinical Data: Orogastric tube placement  PORTABLE ABDOMEN - 1 VIEW  Comparison: Portable exam 1229 hours compared to 03/17/2006  Findings: Tip of nasogastric tube projects over the proximal to mid stomach. Nonobstructive bowel gas pattern. Scattered stool in proximal half of colon. No acute osseous findings. Cardiac silhouette appears enlarged. Question 5 mm left renal calculus.  IMPRESSION: Tip of nasogastric tube projects over the proximal to mid stomach.   Original Report Authenticated By: Ulyses Southward, M.D.   Ct Portable Head W/o Cm  06/22/2012   *RADIOLOGY REPORT*  Clinical Data: Mental status change.  Stroke  CT HEAD WITHOUT CONTRAST  Technique:  Contiguous axial images were obtained from the base of the skull through the vertex without contrast.  Comparison: CT 06/22/2012  Findings: Left parietal hemorrhage is similar to the prior study measuring 6 x 7 cm.  This is consistent with hemorrhagic infarction. Hemorrhage in the left body of the corpus callosum / caudate has improved in the interval.  Mass effect on the left lateral ventricle has improved.  Dilatation of the right lateral ventricle also has improved.  There is mass effect and 8 mm midline shift to the right.  There is mass effect on the left lateral ventricle.  No hydrocephalus.  Chronic ischemic changes in the Antonio white matter and basal ganglia on the right are unchanged.  IMPRESSION: Large area of hemorrhagic infarction left parietal lobe is similar. There is improvement in blood in the corpus collosum/ caudate on the left.  8 mm midline shift to the right without hydrocephalus.   Original Report Authenticated By: Janeece Riggers, M.D.    @ROS @ as  per wife General ROS: negative for - chills, fatigue, fever, night sweats, weight gain or weight loss  Psychological ROS: negative for - behavioral disorder, hallucinations, memory difficulties, mood swings or suicidal ideation  Ophthalmic ROS: negative for - blurry vision, double vision, eye pain or loss of vision  ENT ROS: negative for - epistaxis, nasal discharge, oral lesions, sore throat, tinnitus or vertigo  Allergy and Immunology ROS: negative for - hives or itchy/watery eyes  Hematological and Lymphatic ROS: negative for - bleeding problems, bruising or swollen lymph nodes  Endocrine ROS: negative for - galactorrhea, hair pattern changes, polydipsia/polyuria or temperature intolerance  Respiratory ROS: negative for - cough, hemoptysis, shortness of breath or wheezing  Cardiovascular ROS: negative for - chest pain, dyspnea on exertion, edema or irregular heartbeat  Gastrointestinal ROS: negative for - abdominal pain, diarrhea, hematemesis, nausea/vomiting or stool incontinence  Genito-Urinary ROS: negative for - dysuria, hematuria, incontinence or urinary frequency/urgency  Musculoskeletal ROS: negative for - joint swelling or muscular weakness  Neurological ROS: as noted in HPI  Dermatological ROS: negative for rash and skin lesion changes  Blood pressure 160/99, pulse 71, temperature 99.4 F (37.4 C), temperature source Axillary, resp. rate 14, height 5\' 11"  (1.803 m), weight 77.9 kg (171 lb 11.8 oz), SpO2 100.00%. Sedated and intubated.  HEENT- Normocephalic, no lesions, without obvious abnormality. Normal external eye and conjunctiva. Normal TM's bilaterally. Normal auditory canals and external ears. Normal external nose, mucus membranes and septum. Normal pharynx.  Neck supple with no masses, nodes, nodules or enlargement.  Cardiovascular - regular rate and rhythm, S1, S2 normal, no murmur, click, rub or gallop  Lungs - Intubated, chest clear, no wheezing, rales, normal symmetric air  entry, Heart exam - S1, S2 normal, no murmur, no gallop, rate regular  Abdomen - soft, non-tender; bowel sounds normal; no masses, no organomegaly  Extremities - no joint deformities, effusion, or inflammation and no edema CNS-Unresponsive.  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 Coma Hypertension  Not a candidate for cardiac interventions until condition stabilizes. Family aware of situation and pending cardiac work-up.  Emmajane Altamura S 06/23/2012, 9:12 AM

## 2012-06-23 NOTE — Progress Notes (Signed)
PULMONARY  / CRITICAL CARE MEDICINE  Name: [name] MRN: [id number] DOB: 06-21-1955    ADMISSION DATE:  06/21/2012 CONSULTATION DATE:  6/13  REFERRING MD :  sethi  PRIMARY SERVICE: stroke team   CHIEF COMPLAINT:  Asked to see for ventilator support, central access and supportive critical care services   BRIEF PATIENT DESCRIPTION:  This is 57 year old male admitted 6/12 w/ Left posterior MCA infarct. Got TPA. Found to have AMS the am of 6/13, CT showed new left ICH w/ midline shift. PCCM asked to see for airway support, IV access and supportive critical care.   SIGNIFICANT EVENTS / STUDIES:  CT head 6/12: Chronic ischemic changes, right greater than left. No acute  abnormality CT head 6/13: Image quality degraded by significant motion. There is a large area of hemorrhage in the left parietal lobe with mass effect and 10 mm midline shift. This is most consistent with  hemorrhagic infarction following TPA.   LINES / TUBES: OETT 6/13 (DIFFICULT AIRWAY, Cords swollen, glide scope, #7, very deep)>>> Right IJ 6/13>>> Right radial A-line 6/13 >>>  CULTURES: Results for orders placed during the hospital encounter of 06/21/12  MRSA PCR SCREENING     Status: None   Collection Time    06/21/12  9:46 PM      Result Value Range Status   MRSA by PCR NEGATIVE  NEGATIVE Final   Comment:            The GeneXpert MRSA Assay (FDA     approved for NASAL specimens     only), is one component of a     comprehensive MRSA colonization     surveillance program. It is not     intended to diagnose MRSA     infection nor to guide or     monitor treatment for     MRSA infections.     ANTIBIOTICS: Anti-infectives   None       SUBJECTIVE:  Sedated on vent  VITAL SIGNS: Temp:  [98.5 F (36.9 C)-102 F (38.9 C)] 99.4 F (37.4 C) (06/14 0735) Pulse Rate:  [64-80] 71 (06/14 0755) Resp:  [14-18] 14 (06/14 0755) BP: (81-169)/(52-99) 160/99 mmHg (06/14 0755) SpO2:  [100 %] 100 % (06/14  0755) Arterial Line BP: (91-206)/(52-111) 156/99 mmHg (06/14 0700) FiO2 (%):  [40 %-100 %] 40 % (06/14 0800) HEMODYNAMICS:   VENTILATOR SETTINGS: Vent Mode:  [-] PRVC FiO2 (%):  [40 %-100 %] 40 % Set Rate:  [14 bmp] 14 bmp Vt Set:  [600 mL] 600 mL PEEP:  [5 cmH20] 5 cmH20 Plateau Pressure:  [20 cmH20-23 cmH20] 22 cmH20 INTAKE / OUTPUT: Intake/Output     06/13 0701 - 06/14 0700 06/14 0701 - 06/15 0700   I.V. (mL/kg) 2023.1 (26)    Other 260    IV Piggyback 1030    Total Intake(mL/kg) 3313.1 (42.5)    Urine (mL/kg/hr) 2977 (1.6) 875 (3.2)   Total Output 2977 875   Net +336.1 -875          PHYSICAL EXAMINATION: General:  57 year old male, obtunded. Does not f/c. Right side flaccid  Neuro:  Opens eyes to noxious stim. Right side is flaccid. Left side appears purposeful at times. Has violent tremor on left  HEENT:  Orally intubated  Cardiovascular:  rrr Lungs:  Rhonchus respirations  Abdomen:  Soft, non-tender + bowel sounds  Musculoskeletal:  Right side flaccid  Skin:  Intact   LABS:  Recent Labs Lab 06/21/12  1940  06/22/12 2101 06/23/12 0301 06/23/12 0915  NA 135  < > 143 145 147*  K 3.1*  --   --  3.7  --   CL 97  --   --  112  --   CO2 26  --   --  21  --   BUN 24*  --   --  20  --   CREATININE 2.26*  --   --  1.78*  --   GLUCOSE 138*  --   --  157*  --   < > = values in this interval not displayed.  Recent Labs Lab 06/21/12 1940 06/23/12 0301  HGB 14.3 12.3*  HCT 44.6 38.3*  WBC 9.0 13.4*  PLT 291 218    CXR: ETT and R IJ are in satisfactory position. Bilateral infiltrates worse compared to 6/13  ASSESSMENT / PLAN:  Principal Problem:   CVA (cerebral infarction) Active Problems:   Difficult airway for intubation   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   Acute respiratory failure - intubated for AMS   Cerebral edema with midline shift   Acute renal insufficiency   Hypertensive crisis with hemorrhagic CVA   Seizure, possible   Hypokalemia   AMI  (acute myocardial infarction)  NEUROLOGIC A:   CVA c/b ICH s/p TPA Cerebral edema 06/23/12: Purposeful movement but does not follow commands P:   - Serial neuro checks - Continue 3% saline  PULMONARY A: Acute respiratory failure in setting of altered acute encephalopathy d/t ICH Difficult airway: required glide scope and bougie/ cords swollen required #7 P:   - Full vent support - F/u cxr - Sedation protocol - Will give IV lasix today for edema on chest x-ray  CARDIOVASCULAR A: HTN crisis P:  - SBP goal 140-160 in setting of ICH - Nicardipine gtt  - F/U ECHO - Start statin - start BB for NSTEMI  RENAL  Recent Labs Lab 06/21/12 1940 06/22/12 0901 06/22/12 1630 06/22/12 2101 06/23/12 0301 06/23/12 0915  NA 135 136 141 143 145 147*  K 3.1*  --   --   --  3.7  --   CL 97  --   --   --  112  --   CO2 26  --   --   --  21  --   GLUCOSE 138*  --   --   --  157*  --   BUN 24*  --   --   --  20  --   CREATININE 2.26*  --   --   --  1.78*  --   CALCIUM 9.3  --   --   --  7.4*  --     A:   Acute renal failure A little better  Hypokalemia   P:   - Correct electrolytes as indicated - Monitor BMET - Avoid nephrotoxins  GASTROINTESTINAL A:  No acute issues  P:   - Place FT - Start nutrition   HEMATOLOGIC A:  Coagulopathy due to tPA, resolved  P:  - Trend cbc - SCDs  INFECTIOUS A:  No acute issue  P:   - Trend fever curve - Trend CBC  ENDOCRINE A:  Minimal hyperglycemia  P:   - Monitor CBG - SSI if needed    TODAY'S SUMMARY: Mr. Tillis presents following a possible embolic stroke that was treated with IV TPA and had hemorrhagic conversion. Now has elevated troponin that is consistent with NSTEMI per cardiology. Will continue full  vent support today. Start enteral nutrition per dietary recommendations. Will start statin therapy and BB for HR and BP control, and begin to wean nicardipine infusion.   Benjamin Hocutt S-ACNP and PEte Tanja Port  NP    STAFF NOTE: I, Dr Lavinia Sharps have personally reviewed patient's available data, including medical history, events of note, physical examination and test results as part of my evaluation. I have discussed with resident/NP and other care providers such as pharmacist, RN and RRT.  In addition,  I personally evaluated patient and elicited key findings of acute respiratory failure secondary to stroke volume hemorrhagic conversion following TPA. Does not meet extubation criteria.  Rest per NP/medical resident whose note is outlined above and that I agree with  The patient is critically ill with multiple organ systems failure and requires high complexity decision making for assessment and support, frequent evaluation and titration of therapies, application of advanced monitoring technologies and extensive interpretation of multiple databases.   Critical Care Time devoted to patient care services described in this note is  30  Minutes independent of nurse practitioner time  Dr. Kalman Shan, M.D., [facility].C.P Pulmonary and Critical Care Medicine Staff Physician [place] [facility] [place] [facility] Pager: [phone], If no answer or between  15:00h - 7:00h: call [phone]  06/23/2012 12:00 PM

## 2012-06-23 NOTE — Progress Notes (Signed)
  Echocardiogram 2D Echocardiogram has been performed.  [name] FRANCES 06/23/2012, 11:47 AM

## 2012-06-23 NOTE — Plan of Care (Signed)
Problem: tPA Day Progression Outcomes-Only if tPA administered Goal: Post tPA image without hemorrhage Outcome: Not Met (add Reason) 06/22/12: Pt had hemorrhagic conversion noted.

## 2012-06-23 NOTE — Progress Notes (Signed)
*  PRELIMINARY RESULTS* Vascular Ultrasound Carotid Duplex (Doppler) has been completed.  Preliminary findings: Technically limited due to right IJ line and patient immobility. No evidence of significant ICA stenosis noted. Antegrade vertebral flow.   Farrel Demark, RDMS, RVT  06/23/2012, 11:13 AM

## 2012-06-24 ENCOUNTER — Inpatient Hospital Stay (HOSPITAL_COMMUNITY): Payer: Medicare Other

## 2012-06-24 LAB — BLOOD GAS, ARTERIAL
Acid-base deficit: 0.2 mmol/L (ref 0.0–2.0)
FIO2: 0.4 %
MECHVT: 600 mL
O2 Saturation: 98.9 %
Patient temperature: 99.8
RATE: 14 resp/min
TCO2: 24.5 mmol/L (ref 0–100)

## 2012-06-24 LAB — PHOSPHORUS: Phosphorus: 1.3 mg/dL — ABNORMAL LOW (ref 2.3–4.6)

## 2012-06-24 LAB — SODIUM: Sodium: 150 mEq/L — ABNORMAL HIGH (ref 135–145)

## 2012-06-24 LAB — COMPREHENSIVE METABOLIC PANEL
ALT: 20 U/L (ref 0–53)
AST: 41 U/L — ABNORMAL HIGH (ref 0–37)
Albumin: 2.6 g/dL — ABNORMAL LOW (ref 3.5–5.2)
Calcium: 7.5 mg/dL — ABNORMAL LOW (ref 8.4–10.5)
Chloride: 120 mEq/L — ABNORMAL HIGH (ref 96–112)
Creatinine, Ser: 1.99 mg/dL — ABNORMAL HIGH (ref 0.50–1.35)
Sodium: 152 mEq/L — ABNORMAL HIGH (ref 135–145)

## 2012-06-24 LAB — CBC
MCH: 19.6 pg — ABNORMAL LOW (ref 26.0–34.0)
MCV: 63.5 fL — ABNORMAL LOW (ref 78.0–100.0)
Platelets: 203 10*3/uL (ref 150–400)
RBC: 5.86 MIL/uL — ABNORMAL HIGH (ref 4.22–5.81)
RDW: 18.3 % — ABNORMAL HIGH (ref 11.5–15.5)
WBC: 14.4 10*3/uL — ABNORMAL HIGH (ref 4.0–10.5)

## 2012-06-24 LAB — GLUCOSE, CAPILLARY
Glucose-Capillary: 148 mg/dL — ABNORMAL HIGH (ref 70–99)
Glucose-Capillary: 176 mg/dL — ABNORMAL HIGH (ref 70–99)
Glucose-Capillary: 195 mg/dL — ABNORMAL HIGH (ref 70–99)
Glucose-Capillary: 217 mg/dL — ABNORMAL HIGH (ref 70–99)

## 2012-06-24 LAB — HEMOGLOBIN A1C: Mean Plasma Glucose: 131 mg/dL — ABNORMAL HIGH (ref <117)

## 2012-06-24 MED ORDER — SODIUM CHLORIDE 0.9 % IJ SOLN
10.0000 mL | INTRAMUSCULAR | Status: DC | PRN
Start: 2012-06-24 — End: 2012-07-03
  Administered 2012-06-24: 10 mL

## 2012-06-24 MED ORDER — SODIUM CHLORIDE 0.9 % IJ SOLN
10.0000 mL | Freq: Two times a day (BID) | INTRAMUSCULAR | Status: DC
  Administered 2012-06-24 – 2012-06-25 (×3): 10 mL
  Administered 2012-06-25: 40 mL
  Administered 2012-06-26: 10 mL
  Administered 2012-06-26: 20 mL
  Administered 2012-06-27 – 2012-06-29 (×4): 10 mL
  Administered 2012-07-01: 30 mL
  Administered 2012-07-02 – 2012-07-03 (×4): 10 mL
  Administered 2012-07-04: 20 mL
  Administered 2012-07-04 – 2012-07-05 (×2): 10 mL
  Administered 2012-07-05: 20 mL
  Administered 2012-07-06: 40 mL
  Administered 2012-07-06 – 2012-07-16 (×8): 10 mL

## 2012-06-24 MED ORDER — POTASSIUM PHOSPHATE DIBASIC 3 MMOLE/ML IV SOLN
30.0000 mmol | Freq: Once | INTRAVENOUS | Status: AC
  Administered 2012-06-24: 30 mmol via INTRAVENOUS
  Filled 2012-06-24: qty 10

## 2012-06-24 MED ORDER — INSULIN ASPART 100 UNIT/ML ~~LOC~~ SOLN
0.0000 [IU] | SUBCUTANEOUS | Status: DC
  Administered 2012-06-24: 1 [IU] via SUBCUTANEOUS
  Administered 2012-06-24: 2 [IU] via SUBCUTANEOUS
  Administered 2012-06-24 – 2012-06-25 (×5): 3 [IU] via SUBCUTANEOUS
  Administered 2012-06-25: 2 [IU] via SUBCUTANEOUS
  Administered 2012-06-25: 1 [IU] via SUBCUTANEOUS
  Administered 2012-06-26: 2 [IU] via SUBCUTANEOUS
  Administered 2012-06-26: 3 [IU] via SUBCUTANEOUS
  Administered 2012-06-26 – 2012-06-27 (×5): 2 [IU] via SUBCUTANEOUS
  Administered 2012-06-27: 3 [IU] via SUBCUTANEOUS
  Administered 2012-06-27: 1 [IU] via SUBCUTANEOUS
  Administered 2012-06-27 – 2012-06-28 (×4): 2 [IU] via SUBCUTANEOUS
  Administered 2012-06-28: 5 [IU] via SUBCUTANEOUS
  Administered 2012-06-28: 2 [IU] via SUBCUTANEOUS
  Administered 2012-06-28: 3 [IU] via SUBCUTANEOUS
  Administered 2012-06-28: 1 [IU] via SUBCUTANEOUS
  Administered 2012-06-28: 2 [IU] via SUBCUTANEOUS
  Administered 2012-06-29 (×5): 3 [IU] via SUBCUTANEOUS
  Administered 2012-06-29: 2 [IU] via SUBCUTANEOUS
  Administered 2012-06-30: 3 [IU] via SUBCUTANEOUS
  Administered 2012-06-30: 1 [IU] via SUBCUTANEOUS
  Administered 2012-06-30: 2 [IU] via SUBCUTANEOUS
  Administered 2012-06-30: 3 [IU] via SUBCUTANEOUS
  Administered 2012-06-30: 5 [IU] via SUBCUTANEOUS
  Administered 2012-06-30 – 2012-07-01 (×2): 2 [IU] via SUBCUTANEOUS
  Administered 2012-07-01: 5 [IU] via SUBCUTANEOUS
  Administered 2012-07-01: 2 [IU] via SUBCUTANEOUS
  Administered 2012-07-01: 3 [IU] via SUBCUTANEOUS
  Administered 2012-07-01: 1 [IU] via SUBCUTANEOUS
  Administered 2012-07-01 – 2012-07-02 (×2): 3 [IU] via SUBCUTANEOUS
  Administered 2012-07-02: 7 [IU] via SUBCUTANEOUS
  Administered 2012-07-02: 5 [IU] via SUBCUTANEOUS
  Administered 2012-07-02: 2 [IU] via SUBCUTANEOUS
  Administered 2012-07-02 – 2012-07-03 (×5): 3 [IU] via SUBCUTANEOUS
  Administered 2012-07-03 (×2): 2 [IU] via SUBCUTANEOUS
  Administered 2012-07-03 (×2): 3 [IU] via SUBCUTANEOUS
  Administered 2012-07-04: 5 [IU] via SUBCUTANEOUS
  Administered 2012-07-04 (×3): 3 [IU] via SUBCUTANEOUS
  Administered 2012-07-04: 2 [IU] via SUBCUTANEOUS
  Administered 2012-07-04 – 2012-07-05 (×2): 3 [IU] via SUBCUTANEOUS
  Administered 2012-07-05: 2 [IU] via SUBCUTANEOUS
  Administered 2012-07-05 (×3): 3 [IU] via SUBCUTANEOUS
  Administered 2012-07-06: 2 [IU] via SUBCUTANEOUS
  Administered 2012-07-06 (×3): 3 [IU] via SUBCUTANEOUS
  Administered 2012-07-06: 2 [IU] via SUBCUTANEOUS
  Administered 2012-07-07 (×4): 1 [IU] via SUBCUTANEOUS
  Administered 2012-07-08 (×3): 2 [IU] via SUBCUTANEOUS
  Administered 2012-07-09: 9 [IU] via SUBCUTANEOUS
  Administered 2012-07-09: 7 [IU] via SUBCUTANEOUS
  Administered 2012-07-09: 2 [IU] via SUBCUTANEOUS
  Administered 2012-07-09: 5 [IU] via SUBCUTANEOUS
  Administered 2012-07-10: 7 [IU] via SUBCUTANEOUS
  Administered 2012-07-10: 5 [IU] via SUBCUTANEOUS
  Administered 2012-07-10: 7 [IU] via SUBCUTANEOUS
  Administered 2012-07-10: 9 [IU] via SUBCUTANEOUS
  Administered 2012-07-10: 5 [IU] via SUBCUTANEOUS
  Administered 2012-07-11: 7 [IU] via SUBCUTANEOUS
  Administered 2012-07-11: 5 [IU] via SUBCUTANEOUS
  Administered 2012-07-11: 04:00:00 via SUBCUTANEOUS
  Administered 2012-07-11 (×2): 5 [IU] via SUBCUTANEOUS
  Administered 2012-07-11: 7 [IU] via SUBCUTANEOUS
  Administered 2012-07-12 (×2): 5 [IU] via SUBCUTANEOUS
  Administered 2012-07-12: 2 [IU] via SUBCUTANEOUS
  Administered 2012-07-12: 3 [IU] via SUBCUTANEOUS
  Administered 2012-07-12: 7 [IU] via SUBCUTANEOUS
  Administered 2012-07-12: 3 [IU] via SUBCUTANEOUS
  Administered 2012-07-13 (×2): 5 [IU] via SUBCUTANEOUS
  Administered 2012-07-13: 2 [IU] via SUBCUTANEOUS
  Administered 2012-07-13 (×3): 3 [IU] via SUBCUTANEOUS
  Administered 2012-07-14: 2 [IU] via SUBCUTANEOUS
  Administered 2012-07-14 (×2): 3 [IU] via SUBCUTANEOUS
  Administered 2012-07-14: 2 [IU] via SUBCUTANEOUS
  Administered 2012-07-14: 3 [IU] via SUBCUTANEOUS
  Administered 2012-07-14 – 2012-07-15 (×2): 2 [IU] via SUBCUTANEOUS
  Administered 2012-07-15: 3 [IU] via SUBCUTANEOUS
  Administered 2012-07-15 (×3): 2 [IU] via SUBCUTANEOUS
  Administered 2012-07-15: 5 [IU] via SUBCUTANEOUS
  Administered 2012-07-16 (×2): 3 [IU] via SUBCUTANEOUS
  Administered 2012-07-16: 2 [IU] via SUBCUTANEOUS
  Administered 2012-07-16: 23:00:00 via SUBCUTANEOUS
  Administered 2012-07-16: 2 [IU] via SUBCUTANEOUS
  Administered 2012-07-16: 3 [IU] via SUBCUTANEOUS

## 2012-06-24 NOTE — Progress Notes (Signed)
Stroke Team Progress Note  HISTORY  [name] is an 57 y.o. 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 No focal weakness was noted. Patient has been taking aspirin 81 mg per day. CT scan showed no acute intracranial abnormality. NIH stroke score was 8. Patient had marked expressive aphasia as well as gaze preference to the left side and right visual field defect to visual confrontation. He was deemed a candidate for intravenous thrombolytic therapy with TPA.  LSN: [id number] on 06/21/2012  tPA Given: Yes  MRankin: 2  SUBJECTIVE  Patient lying in bed. Intubated and sedated   OBJECTIVE Most recent Vital Signs: Filed Vitals:   06/24/12 0300 06/24/12 0500 06/24/12 0600 06/24/12 0700  BP: 130/70 124/70 134/79 125/63  Pulse: 64 59 59 61  Temp: 100.9 F (38.3 C) 100 F (37.8 C) 100.2 F (37.9 C) 100.6 F (38.1 C)  TempSrc:      Resp: 14 14 14 14   Height:      Weight:      SpO2: 100% 100% 100% 100%   CBG (last 3)   Recent Labs  06/23/12 1608 06/23/12 2020 06/24/12 0003  GLUCAP 144* 138* 176*    IV Fluid Intake:   . feeding supplement (JEVITY 1.2 CAL) 1,000 mL (06/23/12 1630)  . niCARDipine Stopped (06/22/12 1030)  . propofol 30 mcg/kg/min (06/24/12 0700)  . sodium chloride (hypertonic) 40 mL/hr at 06/23/12 1629    MEDICATIONS  . antiseptic oral rinse  15 mL Mouth Rinse QID  . atorvastatin  20 mg Oral q1800  . chlorhexidine  15 mL Mouth Rinse BID  . feeding supplement  60 mL Per Tube BID  . metoprolol tartrate  12.5 mg Oral Q6H  . pantoprazole (PROTONIX) IV  40 mg Intravenous QHS  . phenytoin (DILANTIN) IV  100 mg Intravenous Q8H  . potassium phosphate IVPB (mmol)  30 mmol  Intravenous Once  . sodium chloride  10-40 mL Intracatheter Q12H   PRN:  acetaminophen, acetaminophen, artificial tears, fentaNYL, LORazepam, metoprolol, senna-docusate, sodium chloride  Diet:  NPO   Activity:  Bedrest DVT Prophylaxis:  SCD  CLINICALLY SIGNIFICANT STUDIES Basic Metabolic Panel:   Recent Labs Lab 06/23/12 1430 06/23/12 2101 06/24/12 0410  NA 151* 151* 152*  K 4.4  --  3.4*  CL 117*  --  120*  CO2 22  --  23  GLUCOSE 157*  --  177*  BUN 19  --  26*  CREATININE 1.94*  --  1.99*  CALCIUM 7.6*  --  7.5*  MG  --   --  2.1  PHOS  --   --  1.3*   Liver Function Tests:   Recent Labs Lab 06/21/12 1940 06/24/12 0410  AST 29 41*  ALT 19 20  ALKPHOS 69 47  BILITOT 0.5 0.5  PROT 8.3 6.5  ALBUMIN 4.1 2.6*   CBC:   Recent Labs Lab 06/21/12 1940 06/23/12 0301 06/24/12 0410  WBC 9.0 13.4* 14.4*  NEUTROABS 3.4  --   --   HGB 14.3 12.3* 11.5*  HCT 44.6 38.3* 37.2*  MCV 62.4* 62.4* 63.5*  PLT 291 218 203   Coagulation:   Recent Labs Lab 06/21/12 1940  LABPROT 12.1  INR 0.90   Cardiac Enzymes:  Recent Labs Lab 06/22/12 1144 06/22/12 1630 06/22/12 2216  TROPONINI 7.64* 11.15* 10.83*   Urinalysis: No results found for this basename: COLORURINE, APPERANCEUR, LABSPEC, PHURINE, GLUCOSEU, HGBUR, BILIRUBINUR, KETONESUR, PROTEINUR, UROBILINOGEN, NITRITE, LEUKOCYTESUR,  in the last 168 hours Lipid Panel    Component Value Date/Time   CHOL 185 06/22/2012 0525   TRIG 92 06/22/2012 0525   HDL 58 06/22/2012 0525   CHOLHDL 3.2 06/22/2012 0525   VLDL 18 06/22/2012 0525   LDLCALC 109* 06/22/2012 0525   HgbA1C  Lab Results  Component Value Date   HGBA1C 6.0* 06/22/2012    Urine Drug Screen:   No results found for this basename: labopia,  cocainscrnur,  labbenz,  amphetmu,  thcu,  labbarb    Alcohol Level: No results found for this basename: ETH,  in the last 168 hours  Dg Chest 2 View  06/21/2012   *RADIOLOGY REPORT*  Clinical Data: Cerebral  infarction.  CHEST - 2 VIEW  Comparison: 03/07/2011  Findings: Stable moderate cardiomegaly without evidence of pulmonary edema or pleural effusion.  No focal consolidation is identified in the lungs.  The bony thorax is unremarkable.  IMPRESSION: Moderate cardiomegaly.  No active disease.   Original Report Authenticated By: Irish Lack, M.D.   Ct Head Wo Contrast 06/21/2012   Chronic ischemic changes, right greater than left.  No acute abnormality.  06/22/2012 large area of hemorrhage in the left parietal lobe with mass effect and 10 mm midline shift. This is most consistent with hemorrhagic infarction following TPA.  06/24/12 IMPRESSION:  Large left parietal - frontal - posterior left temporal lobe  hemorrhagic infarct and mass effect upon the left lateral ventricle  relatively similar to the recent exam.  Interval development of interventricular blood within the tract  right lateral ventricle.  Subarachnoid blood most notable right convexity and right sylvian  fissure more apparent than on the prior exam.  Bilateral anterior frontal lobe infarcts greater on the left with  minimal associated petechial hemorrhage.  MRI of the brain    MRA of the brain    2D Echocardiogram   Vascular Ultrasound  Carotid Duplex (Doppler) has been completed. Preliminary findings: Technically limited due to right IJ line and patient immobility. No evidence of significant ICA stenosis noted. Antegrade vertebral flow.   Carotid Doppler   Study Conclusions  - Left ventricle: The cavity size was normal. There was moderate concentric hypertrophy. Systolic function was normal. The estimated ejection fraction was in the range of 60% to 65%. Possible mild hypokinesis of the apical myocardium. - Aortic valve: Trivial regurgitation. - Mitral valve: Moderate regurgitation. - Left atrium: The atrium was mildly dilated. - Right ventricle: There was moderate hypertrophy. - Right atrium: The atrium was mildly  dilated. - Pulmonary arteries: Systolic pressure was mildly   CXR  Tip of right jugular line projects over SVC without pneumothorax. Bibasilar atelectasis.   EKG  normal sinus rhythm.   Therapy Recommendations   Physical Exam  General: The patient is intubated, sedated at the time of the examination.  Respiratory: Lung fields are clear.  Abdomen: Abdomen is soft, nontender.  Skin: No significant peripheral edema is noted.   Neurologic Exam  Cranial nerves: Facial symmetry is present. The patient has a gaze preference to the left. The patient is not blink to threat. The eyes cannot be dolled across midline.  Motor: Motor tone is increased on all 4 extremities, left greater than right. The patient will occasionally have spontaneous movement of the left upper  extremity, apparently localizing. With sternal rub, it appears to be extension of both legs.   Coordination: patient could not cooperate for cerebellar testing.   Gait and station: The patient could not be ambulated.   Reflexes: Deep tendon reflexes are symmetric, but are somewhat brisk in the legs. Toes are neutral bilaterally.    ASSESSMENT [name] is a 57 y.o. male presenting with expressive aphasia with unintelligible speech output. He is status post IV t-PA full dosage at 2017. symptoms were consistent with left middle cerebral artery acute stroke.  Infarct felt to be embolic, workup underway.  On no antithrombotics prior to admission. Now on no antithrombitics post tPA as followup CT acutely repeated due to acute delirium, tonic-clonic seizure activity in left arm, decreasing consciousness. That scan 06/22/2012 shows that there is a large 6 cm  area of hemorrhage in the left parietal lobe with mass effect and 10 mm midline shift.  Patient was subsequently intubated for airway protection. 3% saline started for cytotoxic edema.   Acute left middle cerebral artery stroke, s/p tPA, now with hemorraghic  transformation of left parietal lobe with 10 mm midline shift.  Cerebral edema with midline shift, 3% Saline added  Coma  Hyperlipidemia  Chronic kidney disease, stage 3  Malignant Hypertension, on admission, now normotensive   troponin I elevation 11.15 peak   Hospital day # 3  CT scan now shows some subarachnoid blood, interventricular blood. No significant change in the size of the hemorrhage. Sodium level now 152, BUN and creatinine are going up some.  TREATMENT/PLAN  Continue no antithrombotics due to acute left parietal hemorrhage transformation for secondary stroke prevention.  CT scan in am or sooner if indicated  Continue Current sedation as blood pressure allows to goal of SBP 160. Patient is receiving Fluid bolus now.   Cardiology evaluation, the patient has been seen by Dr.  Sharyn Lull in the past according to the wife Cardiology has seen, no acute management for now  Consider tube feeds  Will start today  Supportive care, critical care medicine following ? Need for trach and PEG   Geno Sydnor KEITH  06/24/2012 7:40 AM

## 2012-06-24 NOTE — Progress Notes (Signed)
PULMONARY  / CRITICAL CARE MEDICINE  Name: [name] MRN: [id number] DOB: [birth year]    ADMISSION DATE:  06/21/2012 CONSULTATION DATE:  6/13  REFERRING MD :  sethi  PRIMARY SERVICE: stroke team   CHIEF COMPLAINT:  Asked to see for ventilator support, central access and supportive critical care services   BRIEF PATIENT DESCRIPTION:  This is 57 year old male admitted 6/12 w/ Left posterior MCA infarct. Got TPA. Found to have AMS the am of 6/13, CT showed new left ICH w/ midline shift. PCCM asked to see for airway support, IV access and supportive critical care.   SIGNIFICANT EVENTS / STUDIES:  CT head 6/12: Chronic ischemic changes, right greater than left. No acute  abnormality CT head 6/13: Image quality degraded by significant motion. There is a large area of hemorrhage in the left parietal lobe with mass effect and 10 mm midline shift. This is most consistent with  hemorrhagic infarction following TPA. ECHO 6/14: EF 60-65%, mild apical hypokinesis CT head 6/15: Large left parietal - frontal - posterior left temporal lobe  hemorrhagic infarct and mass effect upon the left lateral ventricle  relatively similar to the recent exam. Interval development of interventricular blood within the tract right lateral ventricle.  LINES / TUBES: OETT 6/13 (DIFFICULT AIRWAY, Cords swollen, glide scope, #7, very deep)>>> Right IJ 6/13>>> Right radial A-line 6/13 >>>  CULTURES: Sputum 6/`5>>>  ANTIBIOTICS:   SUBJECTIVE:  Sedated on vent  VITAL SIGNS: Temp:  [100 F (37.8 C)-100.9 F (38.3 C)] 100.6 F (38.1 C) (06/15 0700) Pulse Rate:  [59-71] 61 (06/15 0700) Resp:  [14-17] 14 (06/15 0700) BP: (110-171)/(57-79) 125/63 mmHg (06/15 0700) SpO2:  [98 %-100 %] 100 % (06/15 0700) Arterial Line BP: (74-174)/(61-103) 140/68 mmHg (06/15 0700) FiO2 (%):  [40 %] 40 % (06/15 0313) HEMODYNAMICS:   VENTILATOR SETTINGS: Vent Mode:  [-] PRVC FiO2 (%):  [40 %] 40 % Set Rate:  [14 bmp] 14  bmp Vt Set:  [600 mL] 600 mL PEEP:  [5 cmH20] 5 cmH20 Plateau Pressure:  [21 cmH20-23 cmH20] 21 cmH20 INTAKE / OUTPUT: Intake/Output     06/14 0701 - 06/15 0700 06/15 0701 - 06/16 0700   I.V. (mL/kg) 1448.1 (18.6)    Other     NG/GT 480    IV Piggyback     Total Intake(mL/kg) 1928.1 (24.8)    Urine (mL/kg/hr) 4500 (2.4)    Total Output 4500     Net -2571.9            PHYSICAL EXAMINATION: General:  57 year old male, obtunded. Does not f/c. Right side flaccid  Neuro:  . Right side is flaccid. Left side appears purposeful at times. Has violent tremor on left  HEENT:  Orally intubated  Cardiovascular:  rrr Lungs:  Rhonchus respirations  Abdomen:  Soft, non-tender + bowel sounds  Musculoskeletal:  Right side flaccid  Skin:  Intact   LABS:  Recent Labs Lab 06/23/12 0301  06/23/12 1430 06/23/12 2101 06/24/12 0410  NA 145  < > 151* 151* 152*  K 3.7  --  4.4  --  3.4*  CL 112  --  117*  --  120*  CO2 21  --  22  --  23  BUN 20  --  19  --  26*  CREATININE 1.78*  --  1.94*  --  1.99*  GLUCOSE 157*  --  157*  --  177*  < > = values in this interval not  displayed.  Recent Labs Lab 06/21/12 1940 06/23/12 0301 06/24/12 0410  HGB 14.3 12.3* 11.5*  HCT 44.6 38.3* 37.2*  WBC 9.0 13.4* 14.4*  PLT 291 218 203   CBG (last 3)   Recent Labs  06/23/12 2020 06/24/12 0003 06/24/12 0750  GLUCAP 138* 176* 132*     CXR: ETT and R IJ are in satisfactory position. No clear infiltrates   ASSESSMENT / PLAN:  Principal Problem:   CVA (cerebral infarction) Active Problems:   Difficult airway for intubation   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   Acute respiratory failure - intubated for AMS   Cerebral edema with midline shift   Acute renal insufficiency   Hypertensive crisis with hemorrhagic CVA   Seizure, possible   Hypokalemia   AMI (acute myocardial infarction)  NEUROLOGIC A:   CVA c/b ICH s/p TPA-->ICH worse on CT from 6/15 Cerebral edema Interval development  of IVH 6/15 No improvement by exam  P:   - Serial neuro checks - Continue 3% saline  PULMONARY A: Acute respiratory failure in setting of altered acute encephalopathy d/t ICH Difficult airway: required glide scope and bougie/ cords swollen required #7 P:   - Full vent support - F/u cxr - Sedation protocol - will need trach   CARDIOVASCULAR A:  HTN crisis NSTEMI P:  - SBP goal 140-160 in setting of ICH - cont statin - BB for NSTEMI  RENAL A:   Acute renal failure Slight bump in scr. Hypokalemia Therapeutic Hypernatremia - on 3% saline    Recent Labs Lab 06/23/12 0301  06/23/12 1430 06/23/12 2101 06/24/12 0410  NA 145  < > 151* 151* 152*  K 3.7  --  4.4  --  3.4*  CREATININE 1.78*  --  1.94*  --  1.99*  < > = values in this interval not displayed. P:   - Correct electrolytes as indicated - Monitor BMET - Avoid nephrotoxins -hold diuretics-->on dry side now  GASTROINTESTINAL A:  No acute issues  P:   -cont tube feeds   HEMATOLOGIC A:  Coagulopathy due to tPA, resolved  P:  - Trend cbc - SCDs  INFECTIOUS A: Low grade fever and leukocytosis persists 06/24/12 P:   - Trend fever curve - Trend CBC, send sputum and cycle PCT   ENDOCRINE A:  Minimal hyperglycemia  P:   - Monitor CBG - SSI if needed    TODAY'S SUMMARY: [name] presents following a possible embolic stroke that was treated with IV TPA and had hemorrhagic conversion and now Intraventricular involvement. C/b NSTEMI and renal failure. Prognosis poor. Family deciding of trach/PEG...  06/24/2012 8:06 AM  STAFF NOTE: I, Dr Lavinia Sharps have personally reviewed patient's available data, including medical history, events of note, physical examination and test results as part of my evaluation. I have discussed with resident/NP and other care providers such as pharmacist, RN and RRT.  In addition,  I personally evaluated patient and elicited key findings of acute respiratory failure in the  setting of ischemic stroke with hemorrhagic conversion. Continues to be unresponsive. Wife is at the bedside and is asking about tracheostomy process which I explained .  Rest per NP/medical resident whose note is outlined above and that I agree with  The patient is critically ill with multiple organ systems failure and requires high complexity decision making for assessment and support, frequent evaluation and titration of therapies, application of advanced monitoring technologies and extensive interpretation of multiple databases.   Critical Care Time  devoted to patient care services described in this note is  30  Minutes.  Dr. Kalman Shan, M.D., [facility].C.P Pulmonary and Critical Care Medicine Staff Physician [facility] Pager: [phone], If no answer or between  15:00h - 7:00h: call [phone]  06/24/2012 2:08 PM

## 2012-06-24 NOTE — Progress Notes (Signed)
eLink Physician-Brief Progress Note Patient Name: [name] DOB: [birth year] MRN: [id number]  Date of Service  06/24/2012   HPI/Events of Note  Hypokalemia and hypophosphatemia   eICU Interventions  Potassium and Phos replaced   Intervention Category Intermediate Interventions: Electrolyte abnormality - evaluation and management  [name] 06/24/2012, 5:36 AM

## 2012-06-25 DIAGNOSIS — J9600 Acute respiratory failure, unspecified whether with hypoxia or hypercapnia: Secondary | ICD-10-CM

## 2012-06-25 LAB — BASIC METABOLIC PANEL
BUN: 29 mg/dL — ABNORMAL HIGH (ref 6–23)
CO2: 26 mEq/L (ref 19–32)
Chloride: 122 mEq/L — ABNORMAL HIGH (ref 96–112)
Creatinine, Ser: 1.93 mg/dL — ABNORMAL HIGH (ref 0.50–1.35)
Glucose, Bld: 163 mg/dL — ABNORMAL HIGH (ref 70–99)
Potassium: 3.4 mEq/L — ABNORMAL LOW (ref 3.5–5.1)

## 2012-06-25 LAB — TRIGLYCERIDES: Triglycerides: 128 mg/dL (ref <150)

## 2012-06-25 LAB — CBC
HCT: 37.7 % — ABNORMAL LOW (ref 39.0–52.0)
Hemoglobin: 11.7 g/dL — ABNORMAL LOW (ref 13.0–17.0)
WBC: 16 10*3/uL — ABNORMAL HIGH (ref 4.0–10.5)

## 2012-06-25 LAB — GLUCOSE, CAPILLARY
Glucose-Capillary: 205 mg/dL — ABNORMAL HIGH (ref 70–99)
Glucose-Capillary: 204 mg/dL — ABNORMAL HIGH (ref 70–99)

## 2012-06-25 LAB — SODIUM
Sodium: 158 mEq/L — ABNORMAL HIGH (ref 135–145)
Sodium: 155 mEq/L — ABNORMAL HIGH (ref 135–145)

## 2012-06-25 MED ORDER — LABETALOL HCL 5 MG/ML IV SOLN
10.0000 mg | Freq: Once | INTRAVENOUS | Status: AC
  Administered 2012-06-25: 10 mg via INTRAVENOUS

## 2012-06-25 MED ORDER — POTASSIUM CHLORIDE 20 MEQ/15ML (10%) PO LIQD
20.0000 meq | Freq: Once | ORAL | Status: AC
  Administered 2012-06-25: 20 meq
  Filled 2012-06-25: qty 15

## 2012-06-25 MED ORDER — LABETALOL HCL 5 MG/ML IV SOLN
INTRAVENOUS | Status: AC
  Filled 2012-06-25: qty 4

## 2012-06-25 MED ORDER — JEVITY 1.2 CAL PO LIQD
1000.0000 mL | ORAL | Status: DC
  Administered 2012-06-25 – 2012-06-27 (×3): 1000 mL
  Filled 2012-06-25 (×5): qty 1000

## 2012-06-25 MED ORDER — LABETALOL HCL 5 MG/ML IV SOLN: 10.0000 mg | INTRAVENOUS | Status: DC | PRN

## 2012-06-25 MED ORDER — METOCLOPRAMIDE HCL 5 MG/ML IJ SOLN
10.0000 mg | Freq: Three times a day (TID) | INTRAMUSCULAR | Status: DC
  Administered 2012-06-25 – 2012-07-05 (×30): 10 mg via INTRAVENOUS
  Filled 2012-06-25 (×33): qty 2

## 2012-06-25 MED ORDER — LORAZEPAM 2 MG/ML IJ SOLN
1.0000 mg | INTRAMUSCULAR | Status: DC | PRN
  Administered 2012-06-26 (×3): 1 mg via INTRAVENOUS
  Filled 2012-06-25 (×3): qty 1

## 2012-06-25 MED ORDER — PRO-STAT SUGAR FREE PO LIQD
60.0000 mL | Freq: Three times a day (TID) | ORAL | Status: DC
  Administered 2012-06-25 – 2012-06-28 (×9): 60 mL
  Filled 2012-06-25 (×11): qty 60

## 2012-06-25 MED ORDER — LABETALOL HCL 5 MG/ML IV SOLN
10.0000 mg | INTRAVENOUS | Status: DC | PRN
  Administered 2012-06-25: 20 mg via INTRAVENOUS
  Administered 2012-06-25 (×3): 10 mg via INTRAVENOUS
  Administered 2012-06-28 – 2012-06-29 (×2): 20 mg via INTRAVENOUS
  Administered 2012-06-29 – 2012-07-01 (×2): 10 mg via INTRAVENOUS
  Administered 2012-07-03 – 2012-07-07 (×6): 20 mg via INTRAVENOUS
  Administered 2012-07-08: 10 mg via INTRAVENOUS
  Administered 2012-07-08 – 2012-07-09 (×2): 20 mg via INTRAVENOUS
  Administered 2012-07-11: 10 mg via INTRAVENOUS
  Administered 2012-07-17: 20 mg via INTRAVENOUS
  Filled 2012-06-25 (×22): qty 4

## 2012-06-25 MED ORDER — POTASSIUM CHLORIDE 20 MEQ/15ML (10%) PO LIQD: 20.0000 meq | ORAL | Status: DC

## 2012-06-25 NOTE — Progress Notes (Signed)
Stroke Team Progress Note  HISTORY  [name] is an 57 y.o. 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 No focal weakness was noted. Patient has been taking aspirin 81 mg per day. CT scan showed no acute intracranial abnormality. NIH stroke score was 8. Patient had marked expressive aphasia as well as gaze preference to the left side and right visual field defect to visual confrontation. He was deemed a candidate for intravenous thrombolytic therapy with TPA.  LSN: [id number] on 06/21/2012  tPA Given: Yes  MRankin: 2  SUBJECTIVE  Patient lying in bed. Intubated and sedated. Wife is at bedside.   OBJECTIVE Most recent Vital Signs: Filed Vitals:   06/25/12 0200 06/25/12 0300 06/25/12 0400 06/25/12 0500  BP: 131/70 157/77 172/79 157/80  Pulse: 68 68 67 59  Temp: 102 F (38.9 C) 101.8 F (38.8 C) 101.3 F (38.5 C) 101.3 F (38.5 C)  TempSrc:      Resp: 18 15 18 16   Height:      Weight:      SpO2: 100% 100% 99% 99%   CBG (last 3)   Recent Labs  06/24/12 2003 06/24/12 2337 06/25/12 0329  GLUCAP 195* 217* 138*    IV Fluid Intake:   . feeding supplement (JEVITY 1.2 CAL) 60 mL/hr at 06/24/12 2030  . niCARDipine Stopped (06/22/12 1030)  . propofol 30 mcg/kg/min (06/25/12 0500)  . sodium chloride (hypertonic) 40 mL/hr at 06/24/12 2022    MEDICATIONS  . antiseptic oral rinse  15 mL Mouth Rinse QID  . atorvastatin  20 mg Oral q1800  . chlorhexidine  15 mL Mouth Rinse BID  . feeding supplement  60 mL Per Tube BID  . insulin aspart  0-9 Units Subcutaneous Q4H  . metoprolol tartrate  12.5 mg Oral Q6H  . pantoprazole (PROTONIX) IV  40 mg Intravenous QHS  . phenytoin (DILANTIN) IV  100  mg Intravenous Q8H  . sodium chloride  10-40 mL Intracatheter Q12H   PRN:  acetaminophen, acetaminophen, artificial tears, fentaNYL, LORazepam, metoprolol, senna-docusate, sodium chloride  Diet:  NPO tube feeds  Activity:  Bedrest DVT Prophylaxis:  SCD  CLINICALLY SIGNIFICANT STUDIES Basic Metabolic Panel:   Recent Labs Lab 06/24/12 0410  06/24/12 2042 06/25/12 0356  NA 152*  < > 154* 156*  K 3.4*  --   --  3.4*  CL 120*  --   --  122*  CO2 23  --   --  26  GLUCOSE 177*  --   --  163*  BUN 26*  --   --  29*  CREATININE 1.99*  --   --  1.93*  CALCIUM 7.5*  --   --  8.1*  MG 2.1  --   --   --   PHOS 1.3*  --   --   --   < > = values in this interval not displayed. Liver Function Tests:   Recent Labs Lab 06/21/12 1940 06/24/12 0410  AST 29 41*  ALT 19 20  ALKPHOS 69 47  BILITOT 0.5 0.5  PROT 8.3 6.5  ALBUMIN 4.1 2.6*   CBC:   Recent Labs Lab 06/21/12 1940  06/24/12 0410 06/25/12 0356  WBC 9.0  < > 14.4* 16.0*  NEUTROABS 3.4  --   --   --  HGB 14.3  < > 11.5* 11.7*  HCT 44.6  < > 37.2* 37.7*  MCV 62.4*  < > 63.5* 63.6*  PLT 291  < > 203 204  < > = values in this interval not displayed. Coagulation:   Recent Labs Lab 06/21/12 1940  LABPROT 12.1  INR 0.90   Cardiac Enzymes:   Recent Labs Lab 06/22/12 1144 06/22/12 1630 06/22/12 2216  TROPONINI 7.64* 11.15* 10.83*   Urinalysis: No results found for this basename: COLORURINE, APPERANCEUR, LABSPEC, PHURINE, GLUCOSEU, HGBUR, BILIRUBINUR, KETONESUR, PROTEINUR, UROBILINOGEN, NITRITE, LEUKOCYTESUR,  in the last 168 hours Lipid Panel    Component Value Date/Time   CHOL 185 06/22/2012 0525   TRIG 128 06/25/2012 0356   HDL 58 06/22/2012 0525   CHOLHDL 3.2 06/22/2012 0525   VLDL 18 06/22/2012 0525   LDLCALC 109* 06/22/2012 0525   HgbA1C  Lab Results  Component Value Date   HGBA1C 6.2* 06/24/2012    Urine Drug Screen:   No results found for this basename: labopia,  cocainscrnur,  labbenz,  amphetmu,   thcu,  labbarb    Alcohol Level: No results found for this basename: ETH,  in the last 168 hours  Dg Chest 2 View  06/21/2012   *RADIOLOGY REPORT*  Clinical Data: Cerebral infarction.  CHEST - 2 VIEW  Comparison: 03/07/2011  Findings: Stable moderate cardiomegaly without evidence of pulmonary edema or pleural effusion.  No focal consolidation is identified in the lungs.  The bony thorax is unremarkable.  IMPRESSION: Moderate cardiomegaly.  No active disease.   Original Report Authenticated By: Irish Lack, M.D.   Ct Head Wo Contrast 06/21/2012   Chronic ischemic changes, right greater than left.  No acute abnormality.  06/22/2012 large area of hemorrhage in the left parietal lobe with mass effect and 10 mm midline shift. This is most consistent with hemorrhagic infarction following TPA.  06/24/12 IMPRESSION:  Large left parietal - frontal - posterior left temporal lobe  hemorrhagic infarct and mass effect upon the left lateral ventricle  relatively similar to the recent exam.  Interval development of interventricular blood within the tract  right lateral ventricle.  Subarachnoid blood most notable right convexity and right sylvian  fissure more apparent than on the prior exam.  Bilateral anterior frontal lobe infarcts greater on the left with  minimal associated petechial hemorrhage.  MRI of the brain    MRA of the brain    2D Echocardiogram    Study Conclusions  - Left ventricle: The cavity size was normal. There was moderate concentric hypertrophy. Systolic function was normal. The estimated ejection fraction was in the range of 60% to 65%. Possible mild hypokinesis of the apical myocardium. - Aortic valve: Trivial regurgitation. - Mitral valve: Moderate regurgitation. - Left atrium: The atrium was mildly dilated. - Right ventricle: There was moderate hypertrophy. - Right atrium: The atrium was mildly dilated. - Pulmonary arteries: Systolic pressure was  mildly    Carotid Doppler   Vascular Ultrasound  Carotid Duplex (Doppler) has been completed. Preliminary findings: Technically limited due to right IJ line and patient immobility. No evidence of significant ICA stenosis noted. Antegrade vertebral flow.   CXR  Tip of right jugular line projects over SVC without pneumothorax. Bibasilar atelectasis.   EKG  normal sinus rhythm.   Therapy Recommendations   Physical Exam  General: The patient is intubated, sedated at the time of the examination.  Respiratory: Lung fields are clear.  Abdomen: Abdomen is soft, nontender.  Skin: No significant  peripheral edema is noted.   Neurologic Exam  Cranial nerves: Facial symmetry is present. The patient has a gaze preference to the left. The patient is not blink to threat. The eyes cannot be dolled across midline. Pupils are 1-2 mm, symmetric.  Motor: Motor tone is increased on all 4 extremities, left greater than right. The patient will occasionally have spontaneous movement of the left upper extremity, apparently localizing. Motor tone is somewhat reduced today.  Coordination: patient could not cooperate for cerebellar testing.   Gait and station: The patient could not be ambulated.   Reflexes: Deep tendon reflexes are symmetric, but are somewhat brisk in the legs. Toes are neutral bilaterally.    ASSESSMENT [name] is a 57 y.o. male presenting with expressive aphasia with unintelligible speech output. He is status post IV t-PA full dosage at 2017. symptoms were consistent with left middle cerebral artery acute stroke.  Infarct felt to be embolic, workup underway.  On no antithrombotics prior to admission. Now on no antithrombitics post tPA as followup CT acutely repeated due to acute delirium, tonic-clonic seizure activity in left arm, decreasing consciousness. That scan 06/22/2012 shows that there is a large 6 cm  area of hemorrhage in the left parietal lobe with mass effect and 10  mm midline shift.  Patient was subsequently intubated for airway protection. 3% saline started for cytotoxic edema.   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  Cerebral edema with midline shift, 3% Saline added  Coma  Hyperlipidemia  Chronic kidney disease, stage 3  Malignant Hypertension, on admission, now normotensive   troponin I elevation 11.15 peak   Hospital day # 4  CT scan now shows some subarachnoid blood, interventricular blood. No significant change in the size of the hemorrhage. Sodium level now 1526, hypertonic saline has been held. BUN and creatinine are going up some.  TREATMENT/PLAN  Continue no antithrombotics due to acute left parietal hemorrhage transformation for secondary stroke prevention.  CT scan in am   Continue Current sedation as blood pressure allows to goal of SBP 160. Patient is receiving Fluid bolus now.   Cardiology evaluation, the patient has been seen by Dr.  Sharyn Lull in the past according to the wife Cardiology has seen, no acute management for now  Tube feeds  Supportive care, critical care medicine following ? Need for trach and PEG Prognosis in this case is poor. I discussed the somewhat the wife. We will need to make long-term management decisions concerning trach and PEG.    Lesly Dukes  06/25/2012 6:31 AM

## 2012-06-25 NOTE — Progress Notes (Signed)
NUTRITION FOLLOW UP  INTERVENTION: - Decrease Jevity 1.2 to 45 ml/hr with 60 ml Prostat TID to compensate for Propofol.   At goal rate, TF plus calories from Propofol will provide 2266 calories, 149g protein, 875 ml free water. This will meet 100% estimated calorie needs, >100% estimated protein needs.   NUTRITION DIAGNOSIS: Inadequate oral intake related to inability to eat as evidenced by NPO status; ongoing.   Goal: Pt to meet >/= 90% of their estimated nutrition needs, met.   Monitor:  Vent status, TF tolerance, weight trend, labs  ASSESSMENT: Pt with left MCA, received t-PA. CT on 6/13 showed new left ICH with midline shift.  Patient is currently intubated on ventilator support.  MV: 15 L/min Temp:Temp (24hrs), Avg:101.3 F (38.5 C), Min:100.6 F (38.1 C), Max:102 F (38.9 C)  Propofol: 14 ml/hr providing 370 calories from lipid.   Patient has OG tube in place with tip of tube proximal to mid stomach. Jevity 1.2 is infusing @ 60 ml/hr. 60 ml Prostat via tube BID. Tube feeding regimen currently providing 2128 kcal, 139 grams protein, and 1166 ml H2O.  Total calories (TF and Propofol: 2498 calories)  Free water flushes: NA  Residuals: 420 ml at 8 am, 220 ml at 9 am   Height: Ht Readings from Last 1 Encounters:  06/22/12 5\' 11"  (1.803 m)    Weight: Wt Readings from Last 1 Encounters:  06/21/12 171 lb 11.8 oz (77.9 kg)  No new weight  BMI:  Body mass index is 23.96 kg/(m^2).  Estimated Nutritional Needs: Kcal: 2247 Protein: 115-130 grams Fluid: > 2 L/day  Skin: no issues noted  Diet Order: NPO   Intake/Output Summary (Last 24 hours) at 06/25/12 1023 Last data filed at 06/25/12 0900  Gross per 24 hour  Intake 2291.13 ml  Output   3450 ml  Net -1158.87 ml    Last BM: PTA   Labs:   Recent Labs Lab 06/23/12 1430  06/24/12 0410  06/24/12 2042 06/25/12 0356 06/25/12 0915  NA 151*  < > 152*  < > 154* 156* 158*  K 4.4  --  3.4*  --   --  3.4*  --    CL 117*  --  120*  --   --  122*  --   CO2 22  --  23  --   --  26  --   BUN 19  --  26*  --   --  29*  --   CREATININE 1.94*  --  1.99*  --   --  1.93*  --   CALCIUM 7.6*  --  7.5*  --   --  8.1*  --   MG  --   --  2.1  --   --   --   --   PHOS  --   --  1.3*  --   --   --   --   GLUCOSE 157*  --  177*  --   --  163*  --   < > = values in this interval not displayed.  CBG (last 3)   Recent Labs  06/24/12 2337 06/25/12 0329 06/25/12 0755  GLUCAP 217* 138* 172*    Scheduled Meds: . antiseptic oral rinse  15 mL Mouth Rinse QID  . atorvastatin  20 mg Oral q1800  . chlorhexidine  15 mL Mouth Rinse BID  . feeding supplement  60 mL Per Tube BID  . insulin aspart  0-9 Units Subcutaneous  Q4H  . [COMPLETED] labetalol      . metoprolol tartrate  12.5 mg Oral Q6H  . pantoprazole (PROTONIX) IV  40 mg Intravenous QHS  . phenytoin (DILANTIN) IV  100 mg Intravenous Q8H  . sodium chloride  10-40 mL Intracatheter Q12H    Continuous Infusions: . feeding supplement (JEVITY 1.2 CAL) 60 mL/hr at 06/24/12 2030  . niCARDipine Stopped (06/22/12 1030)  . propofol 30 mcg/kg/min (06/25/12 0900)  . sodium chloride (hypertonic) 40 mL/hr at 06/24/12 2022    Past Medical History  Diagnosis Date  . Stroke 2007  . Hypertension   . Difficult airway for intubation 06/22/2012    Deep and anterior. Difficult with standard laryngoscope. Easily intubated with Glidescope   . Coronary artery disease     Stent placement    Past Surgical History  Procedure Laterality Date  . Coronary stent placement    . Cardiac surgery      Kendell Bane RD, LDN, CNSC [phone] Pager [phone] After Hours Pager

## 2012-06-25 NOTE — Progress Notes (Signed)
Nursing 1215 Patient had a second run of SVT with HR increasing to 150s then back to baseline. Dr. Vassie Loll made aware.  KCl ordered.  MD made aware that patient having high gastric residuals.  Reglan 10mg  IVP q8h ordered per Dr. Vassie Loll.

## 2012-06-25 NOTE — Progress Notes (Signed)
Nursing 0900 Patient's SBP greater than 160.  Patient had run of SVT with HR increasing to 140s and then back to baseline.  Dr. Anne Hahn paged.  Order for Labetalol 10mg  IVP q2h PRN SBP>160.  Labetalol 10mg  IVP given.  RN assessed BP q27min.  SBP continues to be elevated.  Dr. Anne Hahn paged.  Order increased to 10-20mg .  Additional 10mg  IVP given. Continued to assess BP.  Will start Cardene drip if BP not in ordered range after second dose of Labetalol per Dr. Anne Hahn.

## 2012-06-25 NOTE — Progress Notes (Signed)
PULMONARY  / CRITICAL CARE MEDICINE  Name: [name] MRN: [id number] DOB: Feb 03, 1955    ADMISSION DATE:  06/21/2012 CONSULTATION DATE:  6/13  REFERRING MD :  sethi  PRIMARY SERVICE: stroke team   CHIEF COMPLAINT:  Asked to see for ventilator mx and supportive critical care services   BRIEF PATIENT DESCRIPTION:  This is 57 year old male admitted 6/12 w/ Left posterior MCA infarct. Got TPA. Found to have AMS the am of 6/13, CT showed new left ICH w/ midline shift. PCCM asked to see for airway support, IV access and supportive critical care.   SIGNIFICANT EVENTS / STUDIES:  CT head 6/12: Chronic ischemic changes, right greater than left. No acute abnormality CT head 6/13: Image quality degraded by significant motion. There is a large area of hemorrhage in the left parietal lobe with mass effect and 10 mm midline shift. This is most consistent with  hemorrhagic infarction following TPA. ECHO 6/14: EF 60-65%, mild apical hypokinesis CT head 6/15: Large left parietal - frontal - posterior left temporal lobe hemorrhagic infarct and mass effect upon the left lateral ventricle relatively similar to the recent exam. Interval development of interventricular blood within the tract right lateral ventricle.  LINES / TUBES: OETT 6/13 (DIFFICULT AIRWAY, Cords swollen, glide scope, #7, very deep)>>> Right IJ 6/13>>> Right radial A-line 6/13 >>>  CULTURES: Sputum 6/15>>>  ANTIBIOTICS:   SUBJECTIVE:  Febrile, BP controlled  VITAL SIGNS: Temp:  [100.6 F (38.1 C)-102 F (38.9 C)] 101.5 F (38.6 C) (06/16 0645) Pulse Rate:  [59-75] 68 (06/16 0645) Resp:  [14-23] 18 (06/16 0645) BP: (131-172)/(67-99) 150/80 mmHg (06/16 0645) SpO2:  [97 %-100 %] 100 % (06/16 0645) Arterial Line BP: (93-169)/(67-121) 97/88 mmHg (06/16 0600) FiO2 (%):  [40 %] 40 % (06/16 0330) HEMODYNAMICS:   VENTILATOR SETTINGS: Vent Mode:  [-] PRVC FiO2 (%):  [40 %] 40 % Set Rate:  [14 bmp] 14 bmp Vt Set:  [600 mL]  600 mL PEEP:  [5 cmH20] 5 cmH20 Plateau Pressure:  [20 cmH20-31 cmH20] 24 cmH20 INTAKE / OUTPUT: Intake/Output     06/15 0701 - 06/16 0700 06/16 0701 - 06/17 0700   I.V. (mL/kg) 1149.1 (14.8)    NG/GT 1270    Total Intake(mL/kg) 2419.1 (31.1)    Urine (mL/kg/hr) 3000 (1.6)    Total Output 3000     Net -580.9            PHYSICAL EXAMINATION: General:  57 year old male, obtunded. Does not f/c. Right side flaccid  Neuro:  . Right side is flaccid. Left side appears purposeful at times. tremor on left has subsided HEENT:  Orally intubated  Cardiovascular:  rrr Lungs:  Rhonchus respirations  Abdomen:  Soft, non-tender + bowel sounds  Musculoskeletal:  Right side flaccid  Skin:  Intact   LABS:  Recent Labs Lab 06/23/12 1430  06/24/12 0410  06/24/12 1520 06/24/12 2042 06/25/12 0356  NA 151*  < > 152*  < > 154* 154* 156*  K 4.4  --  3.4*  --   --   --  3.4*  CL 117*  --  120*  --   --   --  122*  CO2 22  --  23  --   --   --  26  BUN 19  --  26*  --   --   --  29*  CREATININE 1.94*  --  1.99*  --   --   --  1.93*  GLUCOSE 157*  --  177*  --   --   --  163*  < > = values in this interval not displayed.  Recent Labs Lab 06/23/12 0301 06/24/12 0410 06/25/12 0356  HGB 12.3* 11.5* 11.7*  HCT 38.3* 37.2* 37.7*  WBC 13.4* 14.4* 16.0*  PLT 218 203 204   CBG (last 3)   Recent Labs  06/24/12 2003 06/24/12 2337 06/25/12 0329  GLUCAP 195* 217* 138*     CXR: 6/16 ETT and R IJ are in satisfactory position. No clear infiltrates   ASSESSMENT / PLAN:  Principal Problem:   CVA (cerebral infarction) Active Problems:   Difficult airway for intubation   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   Acute respiratory failure - intubated for AMS   Cerebral edema with midline shift   Acute renal insufficiency   Hypertensive crisis with hemorrhagic CVA   Seizure, possible   Hypokalemia   AMI (acute myocardial infarction)  NEUROLOGIC A:   CVA c/b ICH s/p TPA-->ICH worse on CT  from 6/15 Cerebral edema Interval development of IVH 6/15 No improvement by exam  P:   - Serial neuro checks - Off 3% saline  PULMONARY A: Acute respiratory failure in setting of altered acute encephalopathy d/t ICH Difficult airway: required glide scope and bougie/ cords swollen required #7 P:   - Full vent support - Sedation protocol - will need trach at some point once acute issues resolved  CARDIOVASCULAR A:  HTN crisis NSTEMI -peak Tp 11 P:  - SBP goal 140-160 in setting of ICH - cont statin - BB for NSTEMI  RENAL A:   Acute renal failure Slight bump in scr. Hypokalemia Therapeutic Hypernatremia - on 3% saline    Recent Labs Lab 06/23/12 1430  06/24/12 0410  06/24/12 1520 06/24/12 2042 06/25/12 0356  NA 151*  < > 152*  < > 154* 154* 156*  K 4.4  --  3.4*  --   --   --  3.4*  CREATININE 1.94*  --  1.99*  --   --   --  1.93*  < > = values in this interval not displayed. P:   - Let na self correct - Monitor BMET - Avoid nephrotoxins -hold diuretics-->on dry side now  GASTROINTESTINAL A:  No acute issues  P:   -cont tube feeds   HEMATOLOGIC A:  Coagulopathy due to tPA, resolved  P:  - Trend cbc - SCDs  INFECTIOUS A: Low grade fever and leukocytosis persists 06/25/12 P:   - Trend fever curve - Trend CBC, send sputum and cycle PCT , hold off abx  ENDOCRINE A:  Minimal hyperglycemia  P:   - Monitor CBG - SSI if needed    TODAY'S SUMMARY: [name] presents following a possible embolic stroke that was treated with IV TPA and had hemorrhagic conversion and now Intraventricular involvement. C/b NSTEMI and renal failure. Prognosis poor. Family deciding of trach/PEG...   Care during the described time interval was provided by me and/or other providers on the critical care team.  I have reviewed this patient's available data, including medical history, events of note, physical examination and test results as part of my evaluation  CC time x 35  m  Cyril Mourning MD. Tonny Bollman. [facility] Pager [phone] If no response call [phone]   06/25/2012 8:22 AM

## 2012-06-25 NOTE — Progress Notes (Signed)
UR completed 

## 2012-06-25 NOTE — Progress Notes (Signed)
OT Cancellation Note  Patient Details Name: [name] MRN: [id number] DOB: Sep 24, 1955   Cancelled Treatment:    Reason Eval/Treat Not Completed:  Pt remains intubated with bedrest orders. Per MD note, pt with poor prognosis.  Will sign off.  Evern Bio 06/25/2012, 11:39 AM

## 2012-06-26 ENCOUNTER — Inpatient Hospital Stay (HOSPITAL_COMMUNITY): Payer: Medicare Other

## 2012-06-26 ENCOUNTER — Encounter (HOSPITAL_COMMUNITY): Payer: Self-pay | Admitting: Radiology

## 2012-06-26 DIAGNOSIS — I619 Nontraumatic intracerebral hemorrhage, unspecified: Secondary | ICD-10-CM

## 2012-06-26 DIAGNOSIS — N289 Disorder of kidney and ureter, unspecified: Secondary | ICD-10-CM

## 2012-06-26 DIAGNOSIS — I219 Acute myocardial infarction, unspecified: Secondary | ICD-10-CM

## 2012-06-26 DIAGNOSIS — I10 Essential (primary) hypertension: Secondary | ICD-10-CM

## 2012-06-26 LAB — GLUCOSE, CAPILLARY
Glucose-Capillary: 170 mg/dL — ABNORMAL HIGH (ref 70–99)
Glucose-Capillary: 206 mg/dL — ABNORMAL HIGH (ref 70–99)

## 2012-06-26 LAB — CBC
Hemoglobin: 12.3 g/dL — ABNORMAL LOW (ref 13.0–17.0)
MCHC: 30.5 g/dL (ref 30.0–36.0)
RBC: 6.32 MIL/uL — ABNORMAL HIGH (ref 4.22–5.81)

## 2012-06-26 LAB — BASIC METABOLIC PANEL
GFR calc Af Amer: 52 mL/min — ABNORMAL LOW (ref >90)
GFR calc non Af Amer: 45 mL/min — ABNORMAL LOW (ref >90)
Potassium: 3.6 mEq/L (ref 3.5–5.1)
Sodium: 157 mEq/L — ABNORMAL HIGH (ref 135–145)

## 2012-06-26 MED ORDER — VANCOMYCIN HCL IN DEXTROSE 750-5 MG/150ML-% IV SOLN
750.0000 mg | Freq: Two times a day (BID) | INTRAVENOUS | Status: DC
  Administered 2012-06-26 – 2012-06-27 (×3): 750 mg via INTRAVENOUS
  Filled 2012-06-26 (×4): qty 150

## 2012-06-26 MED ORDER — NICARDIPINE HCL IN NACL 40-0.83 MG/200ML-% IV SOLN
5.0000 mg/h | INTRAVENOUS | Status: DC
  Administered 2012-06-26: 6 mg/h via INTRAVENOUS
  Administered 2012-06-27: 12 mg/h via INTRAVENOUS
  Administered 2012-06-27: 10 mg/h via INTRAVENOUS
  Administered 2012-06-27 – 2012-06-28 (×4): 12 mg/h via INTRAVENOUS
  Administered 2012-06-28: 15 mg/h via INTRAVENOUS
  Administered 2012-06-28: 13 mg/h via INTRAVENOUS
  Administered 2012-06-28: 12 mg/h via INTRAVENOUS
  Administered 2012-06-28 (×3): 13 mg/h via INTRAVENOUS
  Administered 2012-06-29 – 2012-06-30 (×16): 15 mg/h via INTRAVENOUS
  Administered 2012-07-01: 20 mg/h via INTRAVENOUS
  Administered 2012-07-01 (×3): 15 mg/h via INTRAVENOUS
  Administered 2012-07-01 (×2): 20 mg/h via INTRAVENOUS
  Administered 2012-07-01 (×3): 15 mg/h via INTRAVENOUS
  Administered 2012-07-02: 7 mg/h via INTRAVENOUS
  Administered 2012-07-02 (×3): 15 mg/h via INTRAVENOUS
  Administered 2012-07-02: 17 mg/h via INTRAVENOUS
  Administered 2012-07-02: 12 mg/h via INTRAVENOUS
  Administered 2012-07-03 (×2): 5 mg/h via INTRAVENOUS
  Administered 2012-07-03: 7 mg/h via INTRAVENOUS
  Filled 2012-06-26 (×55): qty 200

## 2012-06-26 NOTE — Progress Notes (Signed)
Pt transported to CT on vent. No complications.

## 2012-06-26 NOTE — Progress Notes (Signed)
Nursing 1015 Dr. Delton Coombes assessing patient in room.  MD questioned RN regarding tremors.  RN gave Ativan 1mg  IVP per MD.  Tremors continued despite medication.  MD made aware.  No further Ativan given per MD.  Will increase Propofol gtt as needed per MD.

## 2012-06-26 NOTE — Progress Notes (Signed)
PULMONARY  / CRITICAL CARE MEDICINE  Name: [name] MRN: [id number] DOB: [birth year]    ADMISSION DATE:  06/21/2012 CONSULTATION DATE:  6/13  REFERRING MD :  sethi  PRIMARY SERVICE: stroke team   CHIEF COMPLAINT:  Asked to see for ventilator mx and supportive critical care services   BRIEF PATIENT DESCRIPTION:  This is 57 year old male admitted 6/12 w/ Left posterior MCA infarct. Got TPA. Found to have AMS the am of 6/13, CT showed new left ICH w/ midline shift. PCCM asked to see for airway support, IV access and supportive critical care.   SIGNIFICANT EVENTS / STUDIES:  CT head 6/12: Chronic ischemic changes, right greater than left. No acute abnormality CT head 6/13: Image quality degraded by significant motion. There is a large area of hemorrhage in the left parietal lobe with mass effect and 10 mm midline shift. This is most consistent with  hemorrhagic infarction following TPA. ECHO 6/14: EF 60-65%, mild apical hypokinesis CT head 6/15: Large left parietal - frontal - posterior left temporal lobe hemorrhagic infarct and mass effect upon the left lateral ventricle relatively similar to the recent exam. Interval development of interventricular blood within the tract right lateral ventricle.  LINES / TUBES: OETT 6/13 (DIFFICULT AIRWAY, Cords swollen, glide scope, #7, very deep)>>> Right IJ 6/13>>> Right radial A-line 6/13 >>>  CULTURES: Sputum 6/15>>> s aureus >>   ANTIBIOTICS: vanco 6/17 (s aureus sputum) >>   SUBJECTIVE:  Sedation off this am, unresponsive, GCS 3  VITAL SIGNS: Temp:  [98.2 F (36.8 C)-102.6 F (39.2 C)] 101.3 F (38.5 C) (06/17 0800) Pulse Rate:  [64-93] 88 (06/17 0800) Resp:  [17-32] 32 (06/17 0800) BP: (112-192)/(62-145) 124/110 mmHg (06/17 0800) SpO2:  [96 %-100 %] 96 % (06/17 0800) FiO2 (%):  [40 %] 40 % (06/17 0900) Weight:  [75.8 kg (167 lb 1.7 oz)] 75.8 kg (167 lb 1.7 oz) (06/17 0400) HEMODYNAMICS:   VENTILATOR SETTINGS: Vent Mode:  [-]  PRVC FiO2 (%):  [40 %] 40 % Set Rate:  [14 bmp] 14 bmp Vt Set:  [600 mL] 600 mL PEEP:  [5 cmH20] 5 cmH20 Plateau Pressure:  [17 cmH20-24 cmH20] 19 cmH20 INTAKE / OUTPUT: Intake/Output     06/16 0701 - 06/17 0700 06/17 0701 - 06/18 0700   I.V. (mL/kg) 1899.7 (25.1) 143.4 (1.9)   Other 780 20   NG/GT 1155 90   Total Intake(mL/kg) 3834.7 (50.6) 253.4 (3.3)   Urine (mL/kg/hr) 3865 (2.1) 250 (1.3)   Total Output 3865 250   Net -30.3 +3.4          PHYSICAL EXAMINATION: General:  57 year old male, obtunded. Does not f/c. Right side flaccid  Neuro:  . Right side is flaccid. Left side appears purposeful at times. tremor on left has subsided HEENT:  Orally intubated  Cardiovascular:  rrr Lungs:  Rhonchus respirations  Abdomen:  Soft, non-tender + bowel sounds  Musculoskeletal:  Right side flaccid  Skin:  Intact   LABS:  Recent Labs Lab 06/24/12 0410  06/25/12 0356  06/25/12 2135 06/26/12 0301 06/26/12 0847  NA 152*  < > 156*  < > 155* 157* 156*  K 3.4*  --  3.4*  --   --  3.6  --   CL 120*  --  122*  --   --  119*  --   CO2 23  --  26  --   --  28  --   BUN 26*  --  29*  --   --  39*  --   CREATININE 1.99*  --  1.93*  --   --  1.65*  --   GLUCOSE 177*  --  163*  --   --  177*  --   < > = values in this interval not displayed.  Recent Labs Lab 06/24/12 0410 06/25/12 0356 06/26/12 0301  HGB 11.5* 11.7* 12.3*  HCT 37.2* 37.7* 40.3  WBC 14.4* 16.0* 17.1*  PLT 203 204 204   CBG (last 3)   Recent Labs  06/26/12 0030 06/26/12 0323 06/26/12 0822  GLUCAP 206* 162* 165*     CXR: 6/17 ETT and R IJ are in satisfactory position.  Increasing R basilar infiltrate  ASSESSMENT / PLAN:  Principal Problem:   CVA (cerebral infarction) Active Problems:   Difficult airway for intubation   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   Acute respiratory failure - intubated for AMS   Cerebral edema with midline shift   Acute renal insufficiency   Hypertensive crisis with  hemorrhagic CVA   Seizure, possible   Hypokalemia   AMI (acute myocardial infarction)  NEUROLOGIC A:   CVA c/b ICH s/p TPA-->ICH, evolving SAH Cerebral edema Interval development of IVH 6/15, [facility] 6/17 P:   - Serial neuro checks - Off 3% saline, Na 156-7 today  PULMONARY A: Acute respiratory failure in setting of acute encephalopathy d/t ICH Difficult airway: required glide scope and bougie/ cords swollen required #7 P:   - Full vent support - Sedation protocol - will need trach if family wants to pursue long term support  CARDIOVASCULAR A:  HTN crisis NSTEMI -peak Tp 11 P:  - SBP goal 140-160 in setting of ICH - cont statin - BB for NSTEMI  RENAL A:   Acute renal failure Hypokalemia Therapeutic Hypernatremia - 3% saline on hold   Recent Labs Lab 06/24/12 0410  06/25/12 0356  06/25/12 2135 06/26/12 0301 06/26/12 0847  NA 152*  < > 156*  < > 155* 157* 156*  K 3.4*  --  3.4*  --   --  3.6  --   CREATININE 1.99*  --  1.93*  --   --  1.65*  --   < > = values in this interval not displayed. P:   - Let Na self correct - Monitor BMET - Avoid nephrotoxins - hold diuretics-->on dry side now  GASTROINTESTINAL A:  No acute issues  P:   -cont tube feeds   HEMATOLOGIC A:  Coagulopathy due to tPA, resolved  P:  - Trend cbc - SCDs  INFECTIOUS A: Low grade fever and leukocytosis, evolving R basilar PNA > S aureus P:   - start vanco 6/17 until sensitivities available - Trend CBC  ENDOCRINE A:  Minimal hyperglycemia  P:   - Monitor CBG - SSI if needed    TODAY'S SUMMARY: [name] presents following a possible embolic stroke that was treated with IV TPA and had hemorrhagic conversion and now Intraventricular involvement. C/b NSTEMI and renal failure. Prognosis poor. Family deciding of trach/PEG, spoke w Dr Anne Hahn 6/17   Care during the described time interval was provided by me and/or other providers on the critical care team.  I have reviewed this  patient's available data, including medical history, events of note, physical examination and test results as part of my evaluation  CC time x 35 m  Levy Pupa, MD, PhD 06/26/2012, 9:45 AM [facility] [phone] or if no answer  [phone]               

## 2012-06-26 NOTE — Progress Notes (Signed)
Nursing 0800 Dr. Anne Hahn made aware that patient having tremors.  MD also assessed tremors.  RN questioned MD regarding Ativan PRN order.  No further treatment at this time for the tremors per Dr. Anne Hahn.

## 2012-06-26 NOTE — Plan of Care (Signed)
Problem: tPA Day Progression Outcomes-Only if tPA administered Goal: Post tPA image without hemorrhage Outcome: Not Met (add Reason) hemorrhage

## 2012-06-26 NOTE — Progress Notes (Signed)
ANTIBIOTIC CONSULT NOTE - INITIAL  Pharmacy Consult for vancomycin Indication: pneumonia  No Known Allergies  Patient Measurements: Height: 5\' 11"  (180.3 cm) Weight: 167 lb 1.7 oz (75.8 kg) IBW/kg (Calculated) : 75.3  Vital Signs: Temp: 101.3 F (38.5 C) (06/17 0800) Temp src: Other (Comment) (06/17 0900) BP: 124/110 mmHg (06/17 0800) Pulse Rate: 88 (06/17 0800) Intake/Output from previous day: 06/16 0701 - 06/17 0700 In: 3834.7 [I.V.:1899.7; NG/GT:1155] Out: 3865 [Urine:3865] Intake/Output from this shift: Total I/O In: 253.4 [I.V.:143.4; Other:20; NG/GT:90] Out: 250 [Urine:250]  Labs:  Recent Labs  06/24/12 0410 06/25/12 0356 06/26/12 0301  WBC 14.4* 16.0* 17.1*  HGB 11.5* 11.7* 12.3*  PLT 203 204 204  CREATININE 1.99* 1.93* 1.65*   Estimated Creatinine Clearance: 52.6 ml/min (by C-G formula based on Cr of 1.65). No results found for this basename: VANCOTROUGH, Leodis Binet, VANCORANDOM, GENTTROUGH, GENTPEAK, GENTRANDOM, TOBRATROUGH, TOBRAPEAK, TOBRARND, AMIKACINPEAK, AMIKACINTROU, AMIKACIN,  in the last 72 hours   Microbiology: Recent Results (from the past 720 hour(s))  MRSA PCR SCREENING     Status: None   Collection Time    06/21/12  9:46 PM      Result Value Range Status   MRSA by PCR NEGATIVE  NEGATIVE Final   Comment:            The GeneXpert MRSA Assay (FDA     approved for NASAL specimens     only), is one component of a     comprehensive MRSA colonization     surveillance program. It is not     intended to diagnose MRSA     infection nor to guide or     monitor treatment for     MRSA infections.  CULTURE, RESPIRATORY (NON-EXPECTORATED)     Status: None   Collection Time    06/24/12 11:50 AM      Result Value Range Status   Specimen Description TRACHEAL ASPIRATE   Final   Special Requests NONE   Final   Gram Stain PENDING   Incomplete   Culture     Final   Value: ABUNDANT STAPHYLOCOCCUS AUREUS     Note: RIFAMPIN AND GENTAMICIN SHOULD NOT BE  USED AS SINGLE DRUGS FOR TREATMENT OF STAPH INFECTIONS.   Report Status PENDING   Incomplete    Medical History: Past Medical History  Diagnosis Date  . Stroke 2007  . Hypertension   . Difficult airway for intubation 06/22/2012    Deep and anterior. Difficult with standard laryngoscope. Easily intubated with Glidescope   . Coronary artery disease     Stent placement    Medications:  Anti-infectives   None     Assessment: 78 yom admitted for stroke now s/p TPA with hemorrhagic transformation. To start vancomycin for pneumonia. Staph aureus growing in trach aspirate but sensitivities are pending.   Vanc 6/17>>  6/15 TA - abundant SA 6/12 MRSA - NEG  Goal of Therapy:  Vancomycin trough level 15-20 mcg/ml  Plan:  1. Vancomycin 750mg  IV Q12H 2. F/u renal fxn, C&S, clinical status and trough at [facility], Drake Leach 06/26/2012,9:47 AM

## 2012-06-26 NOTE — Progress Notes (Signed)
Stroke Team Progress Note  HISTORY  [name] is an 57 y.o. 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 No focal weakness was noted. Patient has been taking aspirin 81 mg per day. CT scan showed no acute intracranial abnormality. NIH stroke score was 8. Patient had marked expressive aphasia as well as gaze preference to the left side and right visual field defect to visual confrontation. He was deemed a candidate for intravenous thrombolytic therapy with TPA.  LSN: [id number] on 06/21/2012  tPA Given: Yes  MRankin: 2  SUBJECTIVE  Patient lying in bed. Intubated and sedated. Wife is at bedside.   OBJECTIVE Most recent Vital Signs: Filed Vitals:   06/26/12 0311 06/26/12 0400 06/26/12 0500 06/26/12 0600  BP: 137/80 133/63 142/127 136/62  Pulse: 68 77 64 65  Temp:  101.7 F (38.7 C) 100.6 F (38.1 C) 101.3 F (38.5 C)  TempSrc:  Core (Comment)  Core (Comment)  Resp: 23 23 22 18   Height:      Weight:  167 lb 1.7 oz (75.8 kg)    SpO2: 100% 99% 100% 99%   CBG (last 3)   Recent Labs  06/25/12 2013 06/26/12 0030 06/26/12 0323  GLUCAP 204* 206* 162*    IV Fluid Intake:   . feeding supplement (JEVITY 1.2 CAL) 1,000 mL (06/25/12 1707)  . niCARDipine 6 mg/hr (06/26/12 0600)  . propofol 50 mcg/kg/min (06/26/12 0600)  . sodium chloride (hypertonic) 40 mL/hr at 06/24/12 2022    MEDICATIONS  . antiseptic oral rinse  15 mL Mouth Rinse QID  . atorvastatin  20 mg Oral q1800  . chlorhexidine  15 mL Mouth Rinse BID  . feeding supplement  60 mL Per Tube TID  . insulin aspart  0-9 Units Subcutaneous Q4H  . metoCLOPramide (REGLAN) injection  10 mg Intravenous Q8H  . metoprolol tartrate  12.5 mg  Oral Q6H  . pantoprazole (PROTONIX) IV  40 mg Intravenous QHS  . phenytoin (DILANTIN) IV  100 mg Intravenous Q8H  . sodium chloride  10-40 mL Intracatheter Q12H   PRN:  acetaminophen, acetaminophen, artificial tears, fentaNYL, labetalol, LORazepam, metoprolol, senna-docusate, sodium chloride  Diet:  NPO tube feeds  Activity:  Bedrest DVT Prophylaxis:  SCD  CLINICALLY SIGNIFICANT STUDIES Basic Metabolic Panel:   Recent Labs Lab 06/24/12 0410  06/25/12 0356  06/25/12 2135 06/26/12 0301  NA 152*  < > 156*  < > 155* 157*  K 3.4*  --  3.4*  --   --  3.6  CL 120*  --  122*  --   --  119*  CO2 23  --  26  --   --  28  GLUCOSE 177*  --  163*  --   --  177*  BUN 26*  --  29*  --   --  39*  CREATININE 1.99*  --  1.93*  --   --  1.65*  CALCIUM 7.5*  --  8.1*  --   --  8.3*  MG 2.1  --   --   --   --   --   PHOS 1.3*  --   --   --   --   --   < > = values in this interval not displayed. Liver  Function Tests:   Recent Labs Lab 06/21/12 1940 06/24/12 0410  AST 29 41*  ALT 19 20  ALKPHOS 69 47  BILITOT 0.5 0.5  PROT 8.3 6.5  ALBUMIN 4.1 2.6*   CBC:   Recent Labs Lab 06/21/12 1940  06/25/12 0356 06/26/12 0301  WBC 9.0  < > 16.0* 17.1*  NEUTROABS 3.4  --   --   --   HGB 14.3  < > 11.7* 12.3*  HCT 44.6  < > 37.7* 40.3  MCV 62.4*  < > 63.6* 63.8*  PLT 291  < > 204 204  < > = values in this interval not displayed. Coagulation:   Recent Labs Lab 06/21/12 1940  LABPROT 12.1  INR 0.90   Cardiac Enzymes:   Recent Labs Lab 06/22/12 1144 06/22/12 1630 06/22/12 2216  TROPONINI 7.64* 11.15* 10.83*   Urinalysis: No results found for this basename: COLORURINE, APPERANCEUR, LABSPEC, PHURINE, GLUCOSEU, HGBUR, BILIRUBINUR, KETONESUR, PROTEINUR, UROBILINOGEN, NITRITE, LEUKOCYTESUR,  in the last 168 hours Lipid Panel    Component Value Date/Time   CHOL 185 06/22/2012 0525   TRIG 128 06/25/2012 0356   HDL 58 06/22/2012 0525   CHOLHDL 3.2 06/22/2012 0525   VLDL 18 06/22/2012  0525   LDLCALC 109* 06/22/2012 0525   HgbA1C  Lab Results  Component Value Date   HGBA1C 6.2* 06/24/2012    Urine Drug Screen:   No results found for this basename: labopia,  cocainscrnur,  labbenz,  amphetmu,  thcu,  labbarb    Alcohol Level: No results found for this basename: ETH,  in the last 168 hours  Dg Chest 2 View  06/21/2012   *RADIOLOGY REPORT*  Clinical Data: Cerebral infarction.  CHEST - 2 VIEW  Comparison: 03/07/2011  Findings: Stable moderate cardiomegaly without evidence of pulmonary edema or pleural effusion.  No focal consolidation is identified in the lungs.  The bony thorax is unremarkable.  IMPRESSION: Moderate cardiomegaly.  No active disease.   Original Report Authenticated By: Irish Lack, M.D.   Ct Head Wo Contrast 06/21/2012   Chronic ischemic changes, right greater than left.  No acute abnormality.  06/22/2012 large area of hemorrhage in the left parietal lobe with mass effect and 10 mm midline shift. This is most consistent with hemorrhagic infarction following TPA.  06/24/12 IMPRESSION:  Large left parietal - frontal - posterior left temporal lobe  hemorrhagic infarct and mass effect upon the left lateral ventricle  relatively similar to the recent exam.  Interval development of interventricular blood within the tract  right lateral ventricle.  Subarachnoid blood most notable right convexity and right sylvian  fissure more apparent than on the prior exam.  Bilateral anterior frontal lobe infarcts greater on the left with  minimal associated petechial hemorrhage.  MRI of the brain    MRA of the brain    2D Echocardiogram    Study Conclusions  - Left ventricle: The cavity size was normal. There was moderate concentric hypertrophy. Systolic function was normal. The estimated ejection fraction was in the range of 60% to 65%. Possible mild hypokinesis of the apical myocardium. - Aortic valve: Trivial regurgitation. - Mitral valve: Moderate  regurgitation. - Left atrium: The atrium was mildly dilated. - Right ventricle: There was moderate hypertrophy. - Right atrium: The atrium was mildly dilated. - Pulmonary arteries: Systolic pressure was mildly    Carotid Doppler   Vascular Ultrasound  Carotid Duplex (Doppler) has been completed. Preliminary findings: Technically limited due to right IJ line and patient  immobility. No evidence of significant ICA stenosis noted. Antegrade vertebral flow.   CXR  Tip of right jugular line projects over SVC without pneumothorax. Bibasilar atelectasis.   EKG  normal sinus rhythm.   Therapy Recommendations   Physical Exam  General: The patient is intubated, sedated at the time of the examination.  Respiratory: Lung fields are clear.  Abdomen: Abdomen is soft, nontender.  Skin: No significant peripheral edema is noted.   Neurologic Exam  Cranial nerves: Facial symmetry is present. The patient has a gaze preference to the left. The patient is not blink to threat. The eyes cannot be dolled across midline. Pupils are 1-2 mm, symmetric.  Motor: Motor tone is increased on all 4 extremities, left greater than right. The patient has increased tone of the left arm, and the arm is in flexion. Extension of both legs with sternal rub.   Coordination: patient could not cooperate for cerebellar testing.   Gait and station: The patient could not be ambulated.   Reflexes: Deep tendon reflexes are symmetric, but are somewhat brisk in the legs. Toes are neutral bilaterally.    ASSESSMENT [name] is a 57 y.o. male presenting with expressive aphasia with unintelligible speech output. He is status post IV t-PA full dosage at 2017. symptoms were consistent with left middle cerebral artery acute stroke.  Infarct felt to be embolic, workup underway.  On no antithrombotics prior to admission. Now on no antithrombitics post tPA as followup CT acutely repeated due to acute delirium, tonic-clonic  seizure activity in left arm, decreasing consciousness. That scan 06/22/2012 shows that there is a large 6 cm  area of hemorrhage in the left parietal lobe with mass effect and 10 mm midline shift.  Patient was subsequently intubated for airway protection. 3% saline started for cytotoxic edema.   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  Cerebral edema with midline shift, 3% Saline added, currently held, the sodium is 157  Coma  Hyperlipidemia  Chronic kidney disease, stage 3  Malignant Hypertension, on admission, now normotensive   troponin I elevation 11.15 peak   Hospital day # 5  CT scan now shows some subarachnoid blood, interventricular blood. No significant change in the size of the hemorrhage. Sodium level now 157, hypertonic saline has been held. BUN and creatinine are going up some.  CT done today by my reading shows little change, formal reading is pending. SAH is present over both hemispheres. I discussed the prognosis with the wife. At best, the patient is likely going to require total care for ADL's, with a significant aphasia. The wife has not yet decided about trach and peg, and she wants ongoing full code status.   TREATMENT/PLAN  Continue no antithrombotics due to acute left parietal hemorrhage transformation for secondary stroke prevention.  Continue Current sedation as blood pressure allows to goal of SBP 160. Patient is receiving Fluid bolus now.   Cardiology evaluation, the patient has been seen by Dr.  Sharyn Lull in the past according to the wife Cardiology has seen, no acute management for now  Tube feeds  Supportive care, critical care medicine following ? Need for trach and PEG Prognosis in this case is poor. I discussed the prognosis with the wife. We will need to make long-term management decisions concerning trach and PEG.    Lesly Dukes  06/26/2012  6:56 AM

## 2012-06-26 NOTE — Progress Notes (Signed)
Nursing 0900 Dr. Anne Hahn made aware that patient is no longer moving any extremities to painful stimuli with sedation turned off.  No further orders at this time.

## 2012-06-27 ENCOUNTER — Inpatient Hospital Stay (HOSPITAL_COMMUNITY): Payer: Medicare Other

## 2012-06-27 DIAGNOSIS — R569 Unspecified convulsions: Secondary | ICD-10-CM

## 2012-06-27 LAB — MAGNESIUM: Magnesium: 2.3 mg/dL (ref 1.5–2.5)

## 2012-06-27 LAB — CULTURE, RESPIRATORY W GRAM STAIN

## 2012-06-27 LAB — PHOSPHORUS: Phosphorus: 4.4 mg/dL (ref 2.3–4.6)

## 2012-06-27 LAB — BASIC METABOLIC PANEL
Calcium: 8.5 mg/dL (ref 8.4–10.5)
GFR calc Af Amer: 60 mL/min — ABNORMAL LOW (ref >90)
GFR calc non Af Amer: 52 mL/min — ABNORMAL LOW (ref >90)
Potassium: 3.6 mEq/L (ref 3.5–5.1)
Sodium: 153 mEq/L — ABNORMAL HIGH (ref 135–145)

## 2012-06-27 LAB — GLUCOSE, CAPILLARY
Glucose-Capillary: 134 mg/dL — ABNORMAL HIGH (ref 70–99)
Glucose-Capillary: 179 mg/dL — ABNORMAL HIGH (ref 70–99)
Glucose-Capillary: 227 mg/dL — ABNORMAL HIGH (ref 70–99)
Glucose-Capillary: 194 mg/dL — ABNORMAL HIGH (ref 70–99)

## 2012-06-27 LAB — CBC
Hemoglobin: 13.1 g/dL (ref 13.0–17.0)
MCHC: 31.6 g/dL (ref 30.0–36.0)
Platelets: 213 10*3/uL (ref 150–400)
RBC: 6.52 MIL/uL — ABNORMAL HIGH (ref 4.22–5.81)

## 2012-06-27 MED ORDER — CEFAZOLIN SODIUM-DEXTROSE 2-3 GM-% IV SOLR
2.0000 g | Freq: Three times a day (TID) | INTRAVENOUS | Status: DC
  Administered 2012-06-27 – 2012-07-06 (×26): 2 g via INTRAVENOUS
  Filled 2012-06-27 (×28): qty 50

## 2012-06-27 NOTE — Progress Notes (Signed)
PULMONARY  / CRITICAL CARE MEDICINE  Name: [name] MRN: [id number] DOB: [birth year]    ADMISSION DATE:  06/21/2012 CONSULTATION DATE:  6/13  REFERRING MD :  sethi  PRIMARY SERVICE: stroke team   CHIEF COMPLAINT:  Asked to see for ventilator mx and supportive critical care services   BRIEF PATIENT DESCRIPTION:  This is 57 year old male admitted 6/12 w/ Left posterior MCA infarct. Got TPA. Found to have AMS the am of 6/13, CT showed new left ICH w/ midline shift. PCCM asked to see for airway support, IV access and supportive critical care.   SIGNIFICANT EVENTS / STUDIES:  CT head 6/12: Chronic ischemic changes, right greater than left. No acute abnormality CT head 6/13: Image quality degraded by significant motion. There is a large area of hemorrhage in the left parietal lobe with mass effect and 10 mm midline shift. This is most consistent with  hemorrhagic infarction following TPA. ECHO 6/14: EF 60-65%, mild apical hypokinesis CT head 6/15: Large left parietal - frontal - posterior left temporal lobe hemorrhagic infarct and mass effect upon the left lateral ventricle relatively similar to the recent exam. Interval development of interventricular blood within the tract right lateral ventricle.  LINES / TUBES: OETT 6/13 (DIFFICULT AIRWAY, Cords swollen, glide scope, #7, very deep)>>> Right IJ 6/13>>> Right radial A-line 6/13 >>>  CULTURES: Sputum 6/15>>> s aureus >>   ANTIBIOTICS: vanco 6/17 (s aureus sputum) >>   SUBJECTIVE:  Started coughing, rhythmic movement L arm Dr [name] spoke w family again this am about long term goals, family still discussing options.  EEG occuring now  VITAL SIGNS: Temp:  [99.5 F (37.5 C)-102.9 F (39.4 C)] 100.9 F (38.3 C) (06/18 0900) Pulse Rate:  [66-93] 82 (06/18 0900) Resp:  [17-32] 20 (06/18 0900) BP: (131-191)/(60-139) 153/87 mmHg (06/18 0900) SpO2:  [98 %-100 %] 100 % (06/18 0900) FiO2 (%):  [40 %] 40 % (06/18  0920) HEMODYNAMICS:   VENTILATOR SETTINGS: Vent Mode:  [-] PRVC FiO2 (%):  [40 %] 40 % Set Rate:  [14 bmp] 14 bmp Vt Set:  [600 mL] 600 mL PEEP:  [5 cmH20] 5 cmH20 Plateau Pressure:  [16 cmH20-20 cmH20] 18 cmH20 INTAKE / OUTPUT: Intake/Output     06/17 0701 - 06/18 0700 06/18 0701 - 06/19 0700   I.V. (mL/kg) 1777.5 (23.5) 109.8 (1.4)   Other 700    NG/GT 1035 135   IV Piggyback 300    Total Intake(mL/kg) 3812.5 (50.3) 244.8 (3.2)   Urine (mL/kg/hr) 4660 (2.6) 450 (1.9)   Total Output 4660 450   Net -847.5 -205.3          PHYSICAL EXAMINATION: General:  57 year old male, obtunded. Does not f/c. Right side flaccid  Neuro:  . Right side is flaccid. Left side appears purposeful at times. Tremor, rhythmic movement L arm noted HEENT:  Orally intubated  Cardiovascular:  rrr Lungs:  Rhonchus respirations  Abdomen:  Soft, non-tender + bowel sounds  Musculoskeletal:  Right side flaccid  Skin:  Intact   LABS:  Recent Labs Lab 06/25/12 0356  06/26/12 0301  06/26/12 1440 06/26/12 2126 06/27/12 0301  NA 156*  < > 157*  < > 156* 156* 153*  K 3.4*  --  3.6  --   --   --  3.6  CL 122*  --  119*  --   --   --  113*  CO2 26  --  28  --   --   --  32  BUN 29*  --  39*  --   --   --  46*  CREATININE 1.93*  --  1.65*  --   --   --  1.46*  GLUCOSE 163*  --  177*  --   --   --  160*  < > = values in this interval not displayed.  Recent Labs Lab 06/25/12 0356 06/26/12 0301 06/27/12 0301  HGB 11.7* 12.3* 13.1  HCT 37.7* 40.3 41.5  WBC 16.0* 17.1* 17.5*  PLT 204 204 213   CBG (last 3)   Recent Labs  06/26/12 2011 06/26/12 2353 06/27/12 0349  GLUCAP 171* 160* 134*     CXR: 6/18 ETT and R IJ are in satisfactory position. B basilar infiltrates clearing  ASSESSMENT / PLAN:  Principal Problem:   CVA (cerebral infarction) Active Problems:   Difficult airway for intubation   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   Acute respiratory failure - intubated for AMS   Cerebral  edema with midline shift   Acute renal insufficiency   Hypertensive crisis with hemorrhagic CVA   Seizure, possible   Hypokalemia   AMI (acute myocardial infarction)  NEUROLOGIC A:   CVA c/b ICH s/p TPA-->ICH, evolving SAH Cerebral edema Interval development of IVH 6/15, [name] 6/17 P:   - Serial neuro checks - Off 3% saline, Na 153 today - EEG now to r/o seizure activity  PULMONARY A: Acute respiratory failure in setting of acute encephalopathy d/t ICH Difficult airway: required glide scope and bougie/ cords swollen required #7 P:   - Full vent support - Sedation protocol - will need trach if family wants to pursue long term support  CARDIOVASCULAR A:  HTN crisis NSTEMI -peak Tp 11 P:  - SBP goal 140-160 in setting of ICH, nicardipine restarted 6/17 pm - cont statin - BB for NSTEMI  RENAL A:   Acute renal failure Hypokalemia Therapeutic Hypernatremia - 3% saline stopped   Recent Labs Lab 06/25/12 0356  06/26/12 0301  06/26/12 1440 06/26/12 2126 06/27/12 0301  NA 156*  < > 157*  < > 156* 156* 153*  K 3.4*  --  3.6  --   --   --  3.6  CREATININE 1.93*  --  1.65*  --   --   --  1.46*  < > = values in this interval not displayed. P:   - Let Na self correct - Monitor BMET - Avoid nephrotoxins - holding diuretics-->on dry side now  GASTROINTESTINAL A:  No acute issues  P:   -cont tube feeds   HEMATOLOGIC A:  Coagulopathy due to tPA, resolved  P:  - Trend cbc - SCDs  INFECTIOUS A: Low grade fever and leukocytosis, evolving R basilar PNA > S aureus P:   - started vanco 6/17 until sensitivities available - Trend CBC  ENDOCRINE A:  Minimal hyperglycemia  P:   - Monitor CBG - SSI if needed   TODAY'S SUMMARY: [name] presents following a possible embolic stroke that was treated with IV TPA and had hemorrhagic conversion and now Intraventricular involvement. C/b NSTEMI and renal failure. Prognosis poor. Family deciding of trach/PEG, spoke w Dr  [name] 6/17 and 6/18. Still no decisions in this regard.    Care during the described time interval was provided by me and/or other providers on the critical care team.  I have reviewed this patient's available data, including medical history, events of note, physical examination and test results as part of my evaluation  CC time x 30 min  Levy Pupa, MD, PhD 06/27/2012, 10:07 AM [facility] [phone] or if no answer [phone]

## 2012-06-27 NOTE — Progress Notes (Signed)
eLink Physician-Brief Progress Note Patient Name: [name] DOB: [birth year] MRN: [id number]  Date of Service  06/27/2012   HPI/Events of Note   mssa sputum  eICU Interventions  Narrow off vanc to ancef   Intervention Category Major Interventions: Infection - evaluation and management  Nelda Bucks. 06/27/2012, 5:19 PM

## 2012-06-27 NOTE — Progress Notes (Signed)
EEG Completed; Results Pending  

## 2012-06-27 NOTE — Progress Notes (Signed)
ANTIBIOTIC CONSULT NOTE - Follow Up  Pharmacy Consult for Cefazolin Indication: pneumonia  No Known Allergies  Patient Measurements: Height: 5\' 11"  (180.3 cm) Weight: 167 lb 1.7 oz (75.8 kg) IBW/kg (Calculated) : 75.3  Vital Signs: Temp: 102.2 F (39 C) (06/18 1600) Temp src: Core (Comment) (06/18 1200) BP: 181/79 mmHg (06/18 1600) Pulse Rate: 95 (06/18 1600) Intake/Output from previous day: 06/17 0701 - 06/18 0700 In: 3812.5 [I.V.:1777.5; NG/GT:1035; IV Piggyback:300] Out: 4660 [Urine:4660] Intake/Output from this shift: Total I/O In: 1031.1 [I.V.:611.1; NG/GT:270; IV Piggyback:150] Out: 1130 [Urine:1130]  Labs:  Recent Labs  06/25/12 0356 06/26/12 0301 06/27/12 0301  WBC 16.0* 17.1* 17.5*  HGB 11.7* 12.3* 13.1  PLT 204 204 213  CREATININE 1.93* 1.65* 1.46*   Estimated Creatinine Clearance: 59.5 ml/min (by C-G formula based on Cr of 1.46). No results found for this basename: VANCOTROUGH, Leodis Binet, VANCORANDOM, GENTTROUGH, GENTPEAK, GENTRANDOM, TOBRATROUGH, TOBRAPEAK, TOBRARND, AMIKACINPEAK, AMIKACINTROU, AMIKACIN,  in the last 72 hours   Microbiology: Recent Results (from the past 720 hour(s))  MRSA PCR SCREENING     Status: None   Collection Time    06/21/12  9:46 PM      Result Value Range Status   MRSA by PCR NEGATIVE  NEGATIVE Final   Comment:            The GeneXpert MRSA Assay (FDA     approved for NASAL specimens     only), is one component of a     comprehensive MRSA colonization     surveillance program. It is not     intended to diagnose MRSA     infection nor to guide or     monitor treatment for     MRSA infections.  CULTURE, RESPIRATORY (NON-EXPECTORATED)     Status: None   Collection Time    06/24/12 11:50 AM      Result Value Range Status   Specimen Description TRACHEAL ASPIRATE   Final   Special Requests NONE   Final   Gram Stain     Final   Value: FEW WBC PRESENT,BOTH PMN AND MONONUCLEAR     RARE SQUAMOUS EPITHELIAL CELLS PRESENT      FEW GRAM POSITIVE COCCI     IN PAIRS   Culture     Final   Value: ABUNDANT STAPHYLOCOCCUS AUREUS     Note: RIFAMPIN AND GENTAMICIN SHOULD NOT BE USED AS SINGLE DRUGS FOR TREATMENT OF STAPH INFECTIONS.   Report Status 06/27/2012 FINAL   Final   Organism ID, Bacteria STAPHYLOCOCCUS AUREUS   Final   Medical History: Past Medical History  Diagnosis Date  . Stroke 2007  . Hypertension   . Difficult airway for intubation 06/22/2012    Deep and anterior. Difficult with standard laryngoscope. Easily intubated with Glidescope   . Coronary artery disease     Stent placement   Medications:  Anti-infectives   Start     Dose/Rate Route Frequency Ordered Stop   06/27/12 2000  ceFAZolin (ANCEF) IVPB 2 g/50 mL premix     2 g 100 mL/hr over 30 Minutes Intravenous 3 times per day 06/27/12 1758     06/26/12 1100  vancomycin (VANCOCIN) IVPB 750 mg/150 ml premix  Status:  Discontinued     750 mg 150 mL/hr over 60 Minutes Intravenous Every 12 hours 06/26/12 0950 06/27/12 1756     Assessment: 57 yom admitted for stroke now s/p TPA with hemorrhagic transformation.  Staph aureus growing in trach aspirate, and micro data  reveals MSSA.  Plan is to change IV antibiotics to Cefazolin for this.  Will discontinue IV Vancomycin.    Vanc 6/17>>6/18 Cefazolin 6/18 >>  6/15 TA - abundant SA 6/12 MRSA - NEG  Goal of Therapy:  Therapeutic response to IV antibiotics.  Plan:  1. Cefazolin 2 gm. IV every 8 hours. 2. F/u renal fxn, C&S, clinical status  Nadara Mustard, PharmD., MS Clinical Pharmacist Pager:  [phone] Thank you for allowing pharmacy to be part of this patients care team. 06/27/2012,5:58 PM

## 2012-06-27 NOTE — Progress Notes (Signed)
EEG completed at bedside 

## 2012-06-27 NOTE — Progress Notes (Signed)
Stroke Team Progress Note  HISTORY  [name] is an 57 y.o. 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 No focal weakness was noted. Patient has been taking aspirin 81 mg per day. CT scan showed no acute intracranial abnormality. NIH stroke score was 8. Patient had marked expressive aphasia as well as gaze preference to the left side and right visual field defect to visual confrontation. He was deemed a candidate for intravenous thrombolytic therapy with TPA.  LSN: [id number] on 06/21/2012  tPA Given: Yes  MRankin: 2  SUBJECTIVE  Patient lying in bed. Intubated and sedated. Wife is at bedside.   OBJECTIVE Most recent Vital Signs: Filed Vitals:   06/27/12 0630 06/27/12 0645 06/27/12 0700 06/27/12 0715  BP: 159/80 150/76 158/73 160/75  Pulse: 70 68 70 70  Temp: 100.4 F (38 C) 100.6 F (38.1 C) 100.6 F (38.1 C) 100.6 F (38.1 C)  TempSrc:      Resp: 19 18 18 20   Height:      Weight:      SpO2: 100% 100% 100% 100%   CBG (last 3)   Recent Labs  06/26/12 2011 06/26/12 2353 06/27/12 0349  GLUCAP 171* 160* 134*    IV Fluid Intake:   . feeding supplement (JEVITY 1.2 CAL) 1,000 mL (06/26/12 1702)  . niCARDipine 8 mg/hr (06/27/12 0700)  . propofol 45 mcg/kg/min (06/27/12 0700)  . sodium chloride (hypertonic) 40 mL/hr at 06/24/12 2022    MEDICATIONS  . antiseptic oral rinse  15 mL Mouth Rinse QID  . atorvastatin  20 mg Oral q1800  . chlorhexidine  15 mL Mouth Rinse BID  . feeding supplement  60 mL Per Tube TID  . insulin aspart  0-9 Units Subcutaneous Q4H  . metoCLOPramide (REGLAN) injection  10 mg Intravenous Q8H  . metoprolol tartrate  12.5 mg Oral Q6H  . pantoprazole  (PROTONIX) IV  40 mg Intravenous QHS  . phenytoin (DILANTIN) IV  100 mg Intravenous Q8H  . sodium chloride  10-40 mL Intracatheter Q12H  . vancomycin  750 mg Intravenous Q12H   PRN:  acetaminophen, acetaminophen, artificial tears, fentaNYL, labetalol, LORazepam, metoprolol, senna-docusate, sodium chloride  Diet:  NPO tube feeds  Activity:  Bedrest DVT Prophylaxis:  SCD  CLINICALLY SIGNIFICANT STUDIES Basic Metabolic Panel:   Recent Labs Lab 06/24/12 0410  06/26/12 0301  06/26/12 2126 06/27/12 0301  NA 152*  < > 157*  < > 156* 153*  K 3.4*  < > 3.6  --   --  3.6  CL 120*  < > 119*  --   --  113*  CO2 23  < > 28  --   --  32  GLUCOSE 177*  < > 177*  --   --  160*  BUN 26*  < > 39*  --   --  46*  CREATININE 1.99*  < > 1.65*  --   --  1.46*  CALCIUM 7.5*  < > 8.3*  --   --  8.5  MG 2.1  --   --   --   --  2.3  PHOS 1.3*  --   --   --   --  4.4  < > = values in this interval not displayed. Liver  Function Tests:   Recent Labs Lab 06/21/12 1940 06/24/12 0410  AST 29 41*  ALT 19 20  ALKPHOS 69 47  BILITOT 0.5 0.5  PROT 8.3 6.5  ALBUMIN 4.1 2.6*   CBC:   Recent Labs Lab 06/21/12 1940  06/26/12 0301 06/27/12 0301  WBC 9.0  < > 17.1* 17.5*  NEUTROABS 3.4  --   --   --   HGB 14.3  < > 12.3* 13.1  HCT 44.6  < > 40.3 41.5  MCV 62.4*  < > 63.8* 63.7*  PLT 291  < > 204 213  < > = values in this interval not displayed. Coagulation:   Recent Labs Lab 06/21/12 1940  LABPROT 12.1  INR 0.90   Cardiac Enzymes:   Recent Labs Lab 06/22/12 1144 06/22/12 1630 06/22/12 2216  TROPONINI 7.64* 11.15* 10.83*   Urinalysis: No results found for this basename: COLORURINE, APPERANCEUR, LABSPEC, PHURINE, GLUCOSEU, HGBUR, BILIRUBINUR, KETONESUR, PROTEINUR, UROBILINOGEN, NITRITE, LEUKOCYTESUR,  in the last 168 hours Lipid Panel    Component Value Date/Time   CHOL 185 06/22/2012 0525   TRIG 128 06/25/2012 0356   HDL 58 06/22/2012 0525   CHOLHDL 3.2 06/22/2012 0525   VLDL 18  06/22/2012 0525   LDLCALC 109* 06/22/2012 0525   HgbA1C  Lab Results  Component Value Date   HGBA1C 6.2* 06/24/2012    Urine Drug Screen:   No results found for this basename: labopia,  cocainscrnur,  labbenz,  amphetmu,  thcu,  labbarb    Alcohol Level: No results found for this basename: ETH,  in the last 168 hours  Dg Chest 2 View  06/21/2012   *RADIOLOGY REPORT*  Clinical Data: Cerebral infarction.  CHEST - 2 VIEW  Comparison: 03/07/2011  Findings: Stable moderate cardiomegaly without evidence of pulmonary edema or pleural effusion.  No focal consolidation is identified in the lungs.  The bony thorax is unremarkable.  IMPRESSION: Moderate cardiomegaly.  No active disease.   Original Report Authenticated By: Irish Lack, M.D.   Ct Head Wo Contrast 06/21/2012   Chronic ischemic changes, right greater than left.  No acute abnormality.  06/22/2012 large area of hemorrhage in the left parietal lobe with mass effect and 10 mm midline shift. This is most consistent with hemorrhagic infarction following TPA.  06/24/12 IMPRESSION:  Large left parietal - frontal - posterior left temporal lobe  hemorrhagic infarct and mass effect upon the left lateral ventricle  relatively similar to the recent exam.  Interval development of interventricular blood within the tract  right lateral ventricle.  Subarachnoid blood most notable right convexity and right sylvian  fissure more apparent than on the prior exam.  Bilateral anterior frontal lobe infarcts greater on the left with  minimal associated petechial hemorrhage.  MRI of the brain    MRA of the brain    2D Echocardiogram    Study Conclusions  - Left ventricle: The cavity size was normal. There was moderate concentric hypertrophy. Systolic function was normal. The estimated ejection fraction was in the range of 60% to 65%. Possible mild hypokinesis of the apical myocardium. - Aortic valve: Trivial regurgitation. - Mitral valve:  Moderate regurgitation. - Left atrium: The atrium was mildly dilated. - Right ventricle: There was moderate hypertrophy. - Right atrium: The atrium was mildly dilated. - Pulmonary arteries: Systolic pressure was mildly    Carotid Doppler   Vascular Ultrasound  Carotid Duplex (Doppler) has been completed. Preliminary findings: Technically limited due to right IJ line and patient  immobility. No evidence of significant ICA stenosis noted. Antegrade vertebral flow.   CXR  Tip of right jugular line projects over SVC without pneumothorax. Bibasilar atelectasis.   EKG  normal sinus rhythm.   Therapy Recommendations   Physical Exam  General: The patient is intubated, sedated at the time of the examination.  Respiratory: Lung fields are clear.  Abdomen: Abdomen is soft, nontender.  Skin: No significant peripheral edema is noted.   Neurologic Exam  Cranial nerves: Facial symmetry is present. The patient has a gaze preference to the left. The patient is not blink to threat. The eyes cannot be dolled across midline. Pupils are 1-2 mm, symmetric.  Motor: Motor tone is increased on all 4 extremities, left greater than right. The patient has increased tone of the left arm, and the arm is in flexion. Extension of both legs with sternal rub.   Coordination: patient could not cooperate for cerebellar testing.   Gait and station: The patient could not be ambulated.   Reflexes: Deep tendon reflexes are symmetric, but are somewhat brisk in the legs. Toes are neutral bilaterally.    ASSESSMENT [name] is a 57 y.o. male presenting with expressive aphasia with unintelligible speech output. He is status post IV t-PA full dosage at 2017. symptoms were consistent with left middle cerebral artery acute stroke.  Infarct felt to be embolic, workup underway.  On no antithrombotics prior to admission. Now on no antithrombitics post tPA as followup CT acutely repeated due to acute delirium,  tonic-clonic seizure activity in left arm, decreasing consciousness. That scan 06/22/2012 shows that there is a large 6 cm  area of hemorrhage in the left parietal lobe with mass effect and 10 mm midline shift.  Patient was subsequently intubated for airway protection. 3% saline started for cytotoxic edema.   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  Cerebral edema with midline shift, 3% Saline added, currently held, the sodium is 153  Coma  Hyperlipidemia  Chronic kidney disease, stage 3  Malignant Hypertension, on admission, now normotensive   troponin I elevation 11.15 peak   Hospital day # 6  CT scan now shows some subarachnoid blood, interventricular blood. No significant change in the size of the hemorrhage. Sodium level now 157, hypertonic saline has been held. BUN and creatinine are going up some.  CT done today by my reading shows little change, formal reading is pending. SAH is present over both hemispheres. I discussed the prognosis with the wife. At best, the patient is likely going to require total care for ADL's, with a significant aphasia. The wife has not yet decided about trach and peg, and she wants ongoing full code status.   TREATMENT/PLAN  Continue no antithrombotics due to acute left parietal hemorrhage transformation for secondary stroke prevention.  Continue Current sedation as blood pressure allows to goal of SBP 160.   Cardiology evaluation, the patient has been seen by Dr.  Sharyn Lull in the past according to the wife Cardiology has seen, no acute management for now  Tube feeds  Supportive care, critical care medicine following ? Need for trach and PEG  Will check an EEG today, the patient is having frequent "tremors" of the arms. He is on dilantin. Prognosis in this case is poor. I discussed the prognosis with the wife again today. She seems paralyzed with  indecision. We will need to make long-term management decisions concerning trach  and PEG. The wife may talk to other family members concerning this decision.    WILLIS,CHARLES KEITH  06/27/2012 7:23 AM

## 2012-06-28 ENCOUNTER — Inpatient Hospital Stay (HOSPITAL_COMMUNITY): Payer: Medicare Other

## 2012-06-28 LAB — BASIC METABOLIC PANEL
CO2: 29 mEq/L (ref 19–32)
Calcium: 8 mg/dL — ABNORMAL LOW (ref 8.4–10.5)
GFR calc non Af Amer: 44 mL/min — ABNORMAL LOW (ref >90)
Sodium: 150 mEq/L — ABNORMAL HIGH (ref 135–145)

## 2012-06-28 LAB — GLUCOSE, CAPILLARY
Glucose-Capillary: 193 mg/dL — ABNORMAL HIGH (ref 70–99)
Glucose-Capillary: 199 mg/dL — ABNORMAL HIGH (ref 70–99)
Glucose-Capillary: 254 mg/dL — ABNORMAL HIGH (ref 70–99)

## 2012-06-28 MED ORDER — JEVITY 1.2 CAL PO LIQD
1000.0000 mL | ORAL | Status: DC
  Administered 2012-06-28 – 2012-07-02 (×5): 1000 mL
  Administered 2012-07-03: 11:00:00
  Administered 2012-07-04 – 2012-07-06 (×3): 1000 mL
  Filled 2012-06-28 (×16): qty 1000

## 2012-06-28 MED ORDER — MIDAZOLAM HCL 5 MG/ML IJ SOLN
1.0000 mg/h | INTRAMUSCULAR | Status: DC
  Administered 2012-06-28: 1 mg/h via INTRAVENOUS
  Administered 2012-06-29 – 2012-06-30 (×3): 2 mg/h via INTRAVENOUS
  Filled 2012-06-28 (×4): qty 10

## 2012-06-28 MED ORDER — PHENYTOIN 125 MG/5ML PO SUSP
100.0000 mg | Freq: Three times a day (TID) | ORAL | Status: DC
  Administered 2012-06-28 – 2012-07-06 (×23): 100 mg
  Filled 2012-06-28 (×25): qty 4

## 2012-06-28 MED ORDER — PRO-STAT SUGAR FREE PO LIQD
30.0000 mL | Freq: Three times a day (TID) | ORAL | Status: DC
  Administered 2012-06-28 – 2012-06-29 (×4): 30 mL
  Filled 2012-06-28 (×5): qty 30

## 2012-06-28 MED ORDER — MIDAZOLAM BOLUS VIA INFUSION
1.0000 mg | INTRAVENOUS | Status: DC | PRN
  Administered 2012-07-01: 2 mg via INTRAVENOUS
  Filled 2012-06-28: qty 2

## 2012-06-28 NOTE — Progress Notes (Signed)
UR completed 

## 2012-06-28 NOTE — Progress Notes (Signed)
NUTRITION FOLLOW UP  INTERVENTION: - Increase Jevity 1.2 to 60 ml/hr with 30 ml Prostat TID   At goal rate, TF plus calories from Propofol will provide 2213 calories, 124 grams protein, 1166 ml free water. This will meet 100% estimated calorie needs, >100% estimated protein needs.   NUTRITION DIAGNOSIS: Inadequate oral intake related to inability to eat as evidenced by NPO status; ongoing.   Goal: Pt to meet >/= 90% of their estimated nutrition needs, met.   Monitor:  Vent status, TF tolerance, weight trend, labs  ASSESSMENT: Pt with left MCA, received t-PA. CT on 6/13 showed new left ICH with midline shift.  Pt discussed during ICU rounds and with RN.  Per RN family meeting scheduled for Friday. Pt with poor prognosis.  Patient is currently intubated on ventilator support.  MV: 15 L/min Temp:Temp (24hrs), Avg:101.2 F (38.4 C), Min:99.7 F (37.6 C), Max:102.2 F (39 C)  Propofol decreased to 7 ml/hr providing 185 calories from lipid.   Patient has OG tube in place with tip of tube proximal to mid stomach. Jevity 1.2 is infusing @ 60 ml/hr. 60 ml Prostat via tube BID. Tube feeding regimen currently providing 2128 kcal, 139 grams protein, and 1166 ml H2O.  Total calories (TF and Propofol: 2498 calories)  Free water flushes: NA  Residuals: 0   Height: Ht Readings from Last 1 Encounters:  06/22/12 5\' 11"  (1.803 m)    Weight: Wt Readings from Last 1 Encounters:  06/28/12 172 lb 1.6 oz (78.064 kg)  Admission weight: 171 lb 6/12  BMI:  Body mass index is 24.01 kg/(m^2).  Estimated Nutritional Needs: Kcal: 2247 Protein: 115-130 grams Fluid: > 2 L/day  Skin: no issues noted  Diet Order: NPO   Intake/Output Summary (Last 24 hours) at 06/28/12 0955 Last data filed at 06/28/12 0800  Gross per 24 hour  Intake 3177.96 ml  Output   2730 ml  Net 447.96 ml    Last BM: 6/14  Labs:   Recent Labs Lab 06/24/12 0410  06/26/12 0301  06/26/12 2126 06/27/12 0301  06/28/12 0600  NA 152*  < > 157*  < > 156* 153* 150*  K 3.4*  < > 3.6  --   --  3.6 3.2*  CL 120*  < > 119*  --   --  113* 111  CO2 23  < > 28  --   --  32 29  BUN 26*  < > 39*  --   --  46* 52*  CREATININE 1.99*  < > 1.65*  --   --  1.46* 1.68*  CALCIUM 7.5*  < > 8.3*  --   --  8.5 8.0*  MG 2.1  --   --   --   --  2.3  --   PHOS 1.3*  --   --   --   --  4.4  --   GLUCOSE 177*  < > 177*  --   --  160* 234*  < > = values in this interval not displayed.  CBG (last 3)   Recent Labs  06/28/12 0007 06/28/12 0356 06/28/12 0744  GLUCAP 254* 199* 193*    Scheduled Meds: . antiseptic oral rinse  15 mL Mouth Rinse QID  . atorvastatin  20 mg Oral q1800  .  ceFAZolin (ANCEF) IV  2 g Intravenous Q8H  . chlorhexidine  15 mL Mouth Rinse BID  . feeding supplement  60 mL Per Tube TID  . insulin  aspart  0-9 Units Subcutaneous Q4H  . metoCLOPramide (REGLAN) injection  10 mg Intravenous Q8H  . metoprolol tartrate  12.5 mg Oral Q6H  . pantoprazole (PROTONIX) IV  40 mg Intravenous QHS  . phenytoin (DILANTIN) IV  100 mg Intravenous Q8H  . sodium chloride  10-40 mL Intracatheter Q12H    Continuous Infusions: . feeding supplement (JEVITY 1.2 CAL) 1,000 mL (06/27/12 1739)  . niCARDipine 13 mg/hr (06/28/12 0923)  . propofol Stopped (06/28/12 4098)    Past Medical History  Diagnosis Date  . Stroke 2007  . Hypertension   . Difficult airway for intubation 06/22/2012    Deep and anterior. Difficult with standard laryngoscope. Easily intubated with Glidescope   . Coronary artery disease     Stent placement    Past Surgical History  Procedure Laterality Date  . Coronary stent placement    . Cardiac surgery      Kendell Bane RD, LDN, CNSC [phone] Pager [phone] After Hours Pager

## 2012-06-28 NOTE — Progress Notes (Signed)
Vent alarming- RT suctioned pt and noted TF like substance in mouth. Suctioned pt's mouth. Notified RN. OG auscultated- diminished sounds. Will get xray to confirm before resuming TF.

## 2012-06-28 NOTE — Procedures (Signed)
EEG NUMBER:  REFERRING PHYSICIAN:  Dr. Anne Hahn.  HISTORY:  A 57 year old male with recent infarct and altered mental status.  MEDICATIONS:  NovoLog, Reglan, Lopressor, Dilantin, vancomycin, and Diprivan.  CONDITIONS OF RECORDING:  This is a 16-channel EEG carried out with the patient in the unresponsive state.  DESCRIPTION:  The background activity is slow bilaterally.  It is not symmetric.  Over the right hemisphere, there is a mixture of delta and theta rhythms.  Over the left hemisphere, although some theta is present the most dominant background rhythm is delta.  This slow activity persists throughout the tracing.  The patient has multiple episodes of upper body tremors.  Despite the upper body tremors, no EEG correlate is noted.  The patient is also stimulated with both auditory and painful stimuli.  Despite stimulation, there is no change in the background activity.   Hyperventilation was not performed.  Intermittent photic stimulation failed to elicit any change in the tracing.  IMPRESSION:  This is an abnormal EEG secondary to general background slowing.  This slowing is more prominent over the left hemisphere, which is consistent with the patient's history of a left middle cerebral artery infarct.  The patient's tremors that are noted during the tracing do not have an EEG correlate.  The EEG does not show response to activation procedures.          ______________________________ Thana Farr, MD    [id number] D:  06/27/2012 19:04:29  T:  06/28/2012 00:03:05  Job #:  [id number]

## 2012-06-28 NOTE — Progress Notes (Signed)
PULMONARY  / CRITICAL CARE MEDICINE  Name: [name] MRN: [id number] DOB: [birth year]    ADMISSION DATE:  06/21/2012 CONSULTATION DATE:  6/13  REFERRING MD :  sethi  PRIMARY SERVICE: stroke team   CHIEF COMPLAINT:  Asked to see for ventilator mx and supportive critical care services   BRIEF PATIENT DESCRIPTION:  This is 57 year old male admitted 6/12 w/ Left posterior MCA infarct. Got TPA. Found to have AMS the am of 6/13, CT showed new left ICH w/ midline shift. PCCM asked to see for airway support, IV access and supportive critical care.   SIGNIFICANT EVENTS / STUDIES:  CT head 6/12: Chronic ischemic changes, right greater than left. No acute abnormality CT head 6/13: Image quality degraded by significant motion. There is a large area of hemorrhage in the left parietal lobe with mass effect and 10 mm midline shift. This is most consistent with  hemorrhagic infarction following TPA. ECHO 6/14: EF 60-65%, mild apical hypokinesis CT head 6/15: Large left parietal - frontal - posterior left temporal lobe hemorrhagic infarct and mass effect upon the left lateral ventricle relatively similar to the recent exam. Interval development of interventricular blood within the tract right lateral ventricle. EEG 6/19 am >> general slowing, no seizure activity that correlates w tremor  LINES / TUBES: OETT 6/13 (DIFFICULT AIRWAY, Cords swollen, glide scope, #7, very deep)>>> Right IJ 6/13>>> Right radial A-line 6/13 >>>  CULTURES: Sputum 6/15>>> s aureus >> MSSA  ANTIBIOTICS: vanco 6/17 (s aureus sputum) >> 6/18 Ancef 6/18 (MSSA) >>   SUBJECTIVE:  Continues to have rhythmic movement L arm Unresponsive  VITAL SIGNS: Temp:  [99.7 F (37.6 C)-102.2 F (39 C)] 100.8 F (38.2 C) (06/19 0700) Pulse Rate:  [75-105] 80 (06/19 0745) Resp:  [18-25] 20 (06/19 0745) BP: (113-182)/(73-97) 162/81 mmHg (06/19 0745) SpO2:  [100 %] 100 % (06/19 0745) FiO2 (%):  [30 %-40 %] 30 % (06/19 0745) Weight:   [78.064 kg (172 lb 1.6 oz)] 78.064 kg (172 lb 1.6 oz) (06/19 0500) HEMODYNAMICS:   VENTILATOR SETTINGS: Vent Mode:  [-] PRVC FiO2 (%):  [30 %-40 %] 30 % Set Rate:  [14 bmp] 14 bmp Vt Set:  [600 mL] 600 mL PEEP:  [5 cmH20] 5 cmH20 Plateau Pressure:  [17 cmH20-21 cmH20] 17 cmH20 INTAKE / OUTPUT: Intake/Output     06/18 0701 - 06/19 0700 06/19 0701 - 06/20 0700   I.V. (mL/kg) 751.7 (9.6) 1036 (13.3)   Other 240 20   NG/GT 1080 45   IV Piggyback 250    Total Intake(mL/kg) 2321.7 (29.7) 1101 (14.1)   Urine (mL/kg/hr) 2665 (1.4) 515 (2.3)   Total Output 2665 515   Net -343.3 +586          PHYSICAL EXAMINATION: General:  57 year old male, obtunded. Does not f/c. Right side flaccid  Neuro:  . Right side is flaccid. Left side appears purposeful at times. Tremor, rhythmic movement L arm noted HEENT:  Orally intubated  Cardiovascular:  rrr Lungs:  Rhonchus respirations  Abdomen:  Soft, non-tender + bowel sounds  Musculoskeletal:  Right side flaccid  Skin:  Intact   LABS:  Recent Labs Lab 06/26/12 0301  06/26/12 2126 06/27/12 0301 06/28/12 0600  NA 157*  < > 156* 153* 150*  K 3.6  --   --  3.6 3.2*  CL 119*  --   --  113* 111  CO2 28  --   --  32 29  BUN 39*  --   --  46* 52*  CREATININE 1.65*  --   --  1.46* 1.68*  GLUCOSE 177*  --   --  160* 234*  < > = values in this interval not displayed.  Recent Labs Lab 06/25/12 0356 06/26/12 0301 06/27/12 0301  HGB 11.7* 12.3* 13.1  HCT 37.7* 40.3 41.5  WBC 16.0* 17.1* 17.5*  PLT 204 204 213   CBG (last 3)   Recent Labs  06/28/12 0007 06/28/12 0356 06/28/12 0744  GLUCAP 254* 199* 193*     CXR: 6/18 ETT and R IJ are in satisfactory position. 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 Dr Anne Hahn and I have spoken w family, they are still processing information. Anticipate that he will remain vent dependent, require 24x7 care.   PULMONARY A: Acute respiratory failure in setting of acute encephalopathy d/t ICH Difficult airway: required glide scope and bougie/ cords swollen required #7 P:   - Full vent support - Sedation protocol - will need trach if family wants to pursue long term support  CARDIOVASCULAR A:  HTN crisis NSTEMI -peak Tp 11 P:  - SBP goal 140-160 in setting of ICH, nicardipine restarted 6/17 pm - cont statin - BB for NSTEMI  RENAL A:   Acute renal failure Hypokalemia Therapeutic Hypernatremia - 3% saline stopped   Recent Labs Lab 06/26/12 0301  06/26/12 2126 06/27/12 0301 06/28/12 0600  NA 157*  < > 156* 153* 150*  K 3.6  --   --  3.6 3.2*  CREATININE 1.65*  --   --  1.46* 1.68*  < > = values in this interval not displayed. P:   - Let Na self correct - Monitor BMET - Avoid nephrotoxins - holding diuretics-->on dry side now  GASTROINTESTINAL A:  No acute issues  P:   -cont tube feeds   HEMATOLOGIC A:  Coagulopathy due to tPA, resolved  P:  - Trend cbc - SCDs  INFECTIOUS A: Low grade fever and leukocytosis, evolving R basilar PNA > MSSA P:   - started vanco 6/17, changed to Ancef 6/18 for MSSA - Trend CBC  ENDOCRINE A:  Minimal hyperglycemia  P:   - Monitor CBG - SSI if needed   TODAY'S SUMMARY: [name] presents following a possible embolic stroke that was treated with IV TPA and had hemorrhagic conversion and  now Intraventricular involvement. C/b NSTEMI and renal failure. Prognosis poor. Family deciding of trach/PEG, spoke w Dr Anne Hahn 6/17 and 6/18. I discussed with them 6/19. Still no decisions in this regard. Planning for family meeting at 8:30 on 6/20   Care during the described time interval was provided by me and/or other providers on the critical care team.  I have reviewed this patient's available data, including medical history, events of note, physical examination and test results as part of my evaluation  CC time  x 30 min  Levy Pupa, MD, PhD 06/28/2012, 9:52 AM [facility] [phone] or if no answer [phone]

## 2012-06-28 NOTE — Progress Notes (Signed)
Stroke Team Progress Note  HISTORY  [name] is an 57 y.o. 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 No focal weakness was noted. Patient has been taking aspirin 81 mg per day. CT scan showed no acute intracranial abnormality. NIH stroke score was 8. Patient had marked expressive aphasia as well as gaze preference to the left side and right visual field defect to visual confrontation. He was deemed a candidate for intravenous thrombolytic therapy with TPA.  LSN: [id number] on 06/21/2012  tPA Given: Yes  MRankin: 2  SUBJECTIVE  Patient lying in bed. Intubated and sedated. Wife is at bedside.   OBJECTIVE Most recent Vital Signs: Filed Vitals:   06/28/12 0400 06/28/12 0500 06/28/12 0600 06/28/12 0700  BP: 176/89 182/78 170/75 159/78  Pulse: 94 97 75 78  Temp: 101.1 F (38.4 C) 101.1 F (38.4 C) 101.3 F (38.5 C) 100.8 F (38.2 C)  TempSrc:      Resp: 22 23 18 21   Height:      Weight:  172 lb 1.6 oz (78.064 kg)    SpO2: 100% 100% 100% 100%   CBG (last 3)   Recent Labs  06/27/12 1950 06/28/12 0007 06/28/12 0356  GLUCAP 174* 254* 199*    IV Fluid Intake:   . feeding supplement (JEVITY 1.2 CAL) 1,000 mL (06/27/12 1739)  . niCARDipine 12 mg/hr (06/28/12 0700)  . propofol 30 mcg/kg/min (06/28/12 0700)    MEDICATIONS  . antiseptic oral rinse  15 mL Mouth Rinse QID  . atorvastatin  20 mg Oral q1800  .  ceFAZolin (ANCEF) IV  2 g Intravenous Q8H  . chlorhexidine  15 mL Mouth Rinse BID  . feeding supplement  60 mL Per Tube TID  . insulin aspart  0-9 Units Subcutaneous Q4H  . metoCLOPramide (REGLAN) injection  10 mg Intravenous Q8H  . metoprolol tartrate  12.5 mg Oral Q6H  .  pantoprazole (PROTONIX) IV  40 mg Intravenous QHS  . phenytoin (DILANTIN) IV  100 mg Intravenous Q8H  . sodium chloride  10-40 mL Intracatheter Q12H   PRN:  acetaminophen, acetaminophen, artificial tears, fentaNYL, labetalol, LORazepam, metoprolol, senna-docusate, sodium chloride  Diet:  NPO tube feeds  Activity:  Bedrest DVT Prophylaxis:  SCD  CLINICALLY SIGNIFICANT STUDIES Basic Metabolic Panel:   Recent Labs Lab 06/24/12 0410  06/27/12 0301 06/28/12 0600  NA 152*  < > 153* 150*  K 3.4*  < > 3.6 3.2*  CL 120*  < > 113* 111  CO2 23  < > 32 29  GLUCOSE 177*  < > 160* 234*  BUN 26*  < > 46* 52*  CREATININE 1.99*  < > 1.46* 1.68*  CALCIUM 7.5*  < > 8.5 8.0*  MG 2.1  --  2.3  --   PHOS 1.3*  --  4.4  --   < > = values in this interval not displayed. Liver Function Tests:   Recent Labs Lab 06/21/12 1940 06/24/12 0410  AST 29 41*  ALT 19 20  ALKPHOS 69 47  BILITOT 0.5 0.5  PROT 8.3 6.5  ALBUMIN 4.1 2.6*   CBC:   Recent Labs Lab 06/21/12 1940  06/26/12 0301 06/27/12 0301  WBC 9.0  < > 17.1* 17.5*  NEUTROABS 3.4  --   --   --  HGB 14.3  < > 12.3* 13.1  HCT 44.6  < > 40.3 41.5  MCV 62.4*  < > 63.8* 63.7*  PLT 291  < > 204 213  < > = values in this interval not displayed. Coagulation:   Recent Labs Lab 06/21/12 1940  LABPROT 12.1  INR 0.90   Cardiac Enzymes:   Recent Labs Lab 06/22/12 1144 06/22/12 1630 06/22/12 2216  TROPONINI 7.64* 11.15* 10.83*   Urinalysis: No results found for this basename: COLORURINE, APPERANCEUR, LABSPEC, PHURINE, GLUCOSEU, HGBUR, BILIRUBINUR, KETONESUR, PROTEINUR, UROBILINOGEN, NITRITE, LEUKOCYTESUR,  in the last 168 hours Lipid Panel    Component Value Date/Time   CHOL 185 06/22/2012 0525   TRIG 294* 06/28/2012 0600   HDL 58 06/22/2012 0525   CHOLHDL 3.2 06/22/2012 0525   VLDL 18 06/22/2012 0525   LDLCALC 109* 06/22/2012 0525   HgbA1C  Lab Results  Component Value Date   HGBA1C 6.2* 06/24/2012    Urine Drug Screen:    No results found for this basename: labopia,  cocainscrnur,  labbenz,  amphetmu,  thcu,  labbarb    Alcohol Level: No results found for this basename: ETH,  in the last 168 hours  Dg Chest 2 View  06/21/2012   *RADIOLOGY REPORT*  Clinical Data: Cerebral infarction.  CHEST - 2 VIEW  Comparison: 03/07/2011  Findings: Stable moderate cardiomegaly without evidence of pulmonary edema or pleural effusion.  No focal consolidation is identified in the lungs.  The bony thorax is unremarkable.  IMPRESSION: Moderate cardiomegaly.  No active disease.   Original Report Authenticated By: Irish Lack, M.D.   Ct Head Wo Contrast 06/21/2012   Chronic ischemic changes, right greater than left.  No acute abnormality.  06/22/2012 large area of hemorrhage in the left parietal lobe with mass effect and 10 mm midline shift. This is most consistent with hemorrhagic infarction following TPA.  06/24/12 IMPRESSION:  Large left parietal - frontal - posterior left temporal lobe  hemorrhagic infarct and mass effect upon the left lateral ventricle  relatively similar to the recent exam.  Interval development of interventricular blood within the tract  right lateral ventricle.  Subarachnoid blood most notable right convexity and right sylvian  fissure more apparent than on the prior exam.  Bilateral anterior frontal lobe infarcts greater on the left with  minimal associated petechial hemorrhage.  MRI of the brain    MRA of the brain    2D Echocardiogram    Study Conclusions  - Left ventricle: The cavity size was normal. There was moderate concentric hypertrophy. Systolic function was normal. The estimated ejection fraction was in the range of 60% to 65%. Possible mild hypokinesis of the apical myocardium. - Aortic valve: Trivial regurgitation. - Mitral valve: Moderate regurgitation. - Left atrium: The atrium was mildly dilated. - Right ventricle: There was moderate hypertrophy. - Right atrium: The atrium  was mildly dilated. - Pulmonary arteries: Systolic pressure was mildly    Carotid Doppler   Vascular Ultrasound  Carotid Duplex (Doppler) has been completed. Preliminary findings: Technically limited due to right IJ line and patient immobility. No evidence of significant ICA stenosis noted. Antegrade vertebral flow.   CXR  Tip of right jugular line projects over SVC without pneumothorax. Bibasilar atelectasis.   EKG  normal sinus rhythm.   Therapy Recommendations   Physical Exam  General: The patient is intubated, sedated at the time of the examination.  Respiratory: Lung fields are clear.  Abdomen: Abdomen is soft, nontender.  Skin: No significant  peripheral edema is noted.   Neurologic Exam  Cranial nerves: Facial symmetry is present. The patient has a gaze preference to the left. The patient is not blink to threat. The eyes cannot be dolled across midline. Pupils are 1-2 mm, symmetric. Bilateral L>R corneals.  Motor: Motor tone is increased on all 4 extremities, left greater than right. The patient has increased tone of the left arm, and the arm is in flexion. No respose to sternal rub or deep pain stimulation.  Coordination: patient could not cooperate for cerebellar testing.   Gait and station: The patient could not be ambulated.   Reflexes: Deep tendon reflexes are symmetric, but are somewhat brisk in the legs. Toes are neutral bilaterally.    ASSESSMENT [name] is a 57 y.o. male presenting with expressive aphasia with unintelligible speech output. He is status post IV t-PA full dosage at 2017. symptoms were consistent with left middle cerebral artery acute stroke.  Infarct felt to be embolic, workup underway.  On no antithrombotics prior to admission. Now on no antithrombitics post tPA as followup CT acutely repeated due to acute delirium, tonic-clonic seizure activity in left arm, decreasing consciousness. That scan 06/22/2012 shows that there is a large 6 cm   area of hemorrhage in the left parietal lobe with mass effect and 10 mm midline shift.  Patient was subsequently intubated for airway protection. 3% saline started for cytotoxic edema.   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  Cerebral edema with midline shift, 3% Saline added, currently held, the sodium is 150  Coma  Hyperlipidemia  Chronic kidney disease, stage 3  Malignant Hypertension, on admission, now normotensive   troponin I elevation 11.15 peak   Hospital day # 7  CT scan now shows some subarachnoid blood, interventricular blood. No significant change in the size of the hemorrhage. Sodium level now 150, hypertonic saline has been held. BUN and creatinine are going up some.  CT done today by my reading shows little change, formal reading is pending. SAH is present over both hemispheres. I discussed the prognosis with the wife. At best, the patient is likely going to require total care for ADL's, with a significant aphasia. The wife has not yet decided about trach and peg, and she wants ongoing full code status.   TREATMENT/PLAN  Continue no antithrombotics due to acute left parietal hemorrhage transformation for secondary stroke prevention.  Continue Current sedation as blood pressure allows to goal of SBP 160.   Cardiology evaluation, the patient has been seen by Dr.  Sharyn Lull in the past according to the wife Cardiology has seen, no acute management for now  Tube feeds  Supportive care, critical care medicine following ? Need for trach and PEG  EEG done yesterday did not show epileptiform activity. The left UE tremor is likely related to spasticity from He is on dilantin. Prognosis in this case is poor. I discussed the prognosis with the wife, and I indicated that I think the clinical exam is worsening. No decision about trach and peg yet.     Lesly Dukes  06/28/2012 7:43 AM

## 2012-06-29 LAB — CBC WITH DIFFERENTIAL/PLATELET
Eosinophils Relative: 0 % (ref 0–5)
Hemoglobin: 13.1 g/dL (ref 13.0–17.0)
MCV: 64 fL — ABNORMAL LOW (ref 78.0–100.0)
Monocytes Relative: 9 % (ref 3–12)
Neutrophils Relative %: 84 % — ABNORMAL HIGH (ref 43–77)
Platelets: 249 10*3/uL (ref 150–400)
RBC: 6.67 MIL/uL — ABNORMAL HIGH (ref 4.22–5.81)
WBC: 16.3 10*3/uL — ABNORMAL HIGH (ref 4.0–10.5)

## 2012-06-29 LAB — BASIC METABOLIC PANEL
BUN: 58 mg/dL — ABNORMAL HIGH (ref 6–23)
CO2: 28 mEq/L (ref 19–32)
Chloride: 117 mEq/L — ABNORMAL HIGH (ref 96–112)
Creatinine, Ser: 1.69 mg/dL — ABNORMAL HIGH (ref 0.50–1.35)
Glucose, Bld: 252 mg/dL — ABNORMAL HIGH (ref 70–99)

## 2012-06-29 LAB — GLUCOSE, CAPILLARY: Glucose-Capillary: 181 mg/dL — ABNORMAL HIGH (ref 70–99)

## 2012-06-29 MED ORDER — METOPROLOL TARTRATE 25 MG PO TABS
25.0000 mg | ORAL_TABLET | Freq: Four times a day (QID) | ORAL | Status: DC
  Administered 2012-06-29 – 2012-07-06 (×27): 25 mg via ORAL
  Filled 2012-06-29 (×31): qty 1

## 2012-06-29 MED ORDER — PRO-STAT SUGAR FREE PO LIQD
60.0000 mL | Freq: Two times a day (BID) | ORAL | Status: DC
  Administered 2012-06-30 – 2012-07-04 (×9): 60 mL
  Filled 2012-06-29 (×10): qty 60

## 2012-06-29 MED ORDER — FREE WATER
200.0000 mL | Freq: Three times a day (TID) | Status: DC
  Administered 2012-06-29 – 2012-07-03 (×12): 200 mL

## 2012-06-29 NOTE — Progress Notes (Addendum)
Stroke Team Progress Note  HISTORY  [name] is an 57 y.o. 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 No focal weakness was noted. Patient has been taking aspirin 81 mg per day. CT scan showed no acute intracranial abnormality. NIH stroke score was 8. Patient had marked expressive aphasia as well as gaze preference to the left side and right visual field defect to visual confrontation. He was deemed a candidate for intravenous thrombolytic therapy with TPA.  LSN: [id number] on 06/21/2012  tPA Given: Yes  MRankin: 2  SUBJECTIVE  Patient lying in bed. Intubated and sedated. No one at bedside today.  OBJECTIVE Most recent Vital Signs: Filed Vitals:   06/29/12 1500 06/29/12 1554 06/29/12 1600 06/29/12 1700  BP: 139/64 150/75 157/77 165/71  Pulse: 89 90 100 105  Temp: 100.6 F (38.1 C)  100 F (37.8 C) 99.5 F (37.5 C)  TempSrc:      Resp: 17 24 21 23   Height:      Weight:      SpO2: 100% 100% 100% 99%   CBG (last 3)   Recent Labs  06/29/12 0811 06/29/12 1347 06/29/12 1549  GLUCAP 210* 230* 225*    IV Fluid Intake:   . feeding supplement (JEVITY 1.2 CAL) 1,000 mL (06/29/12 1632)  . midazolam (VERSED) infusion 2 mg/hr (06/29/12 1702)  . niCARDipine 15 mg/hr (06/29/12 1720)    MEDICATIONS  . antiseptic oral rinse  15 mL Mouth Rinse QID  . atorvastatin  20 mg Oral q1800  .  ceFAZolin (ANCEF) IV  2 g Intravenous Q8H  . chlorhexidine  15 mL Mouth Rinse BID  . [START ON 06/30/2012] feeding supplement  60 mL Per Tube BID  . free water  200 mL Per Tube Q8H  . insulin aspart  0-9 Units Subcutaneous Q4H  . metoCLOPramide (REGLAN) injection  10 mg Intravenous Q8H  . metoprolol tartrate   25 mg Oral Q6H  . pantoprazole (PROTONIX) IV  40 mg Intravenous QHS  . phenytoin  100 mg Per Tube TID  . sodium chloride  10-40 mL Intracatheter Q12H   PRN:  acetaminophen, acetaminophen, artificial tears, fentaNYL, labetalol, LORazepam, metoprolol, midazolam, senna-docusate, sodium chloride  Diet:  NPO tube feeds  Activity:  Bedrest DVT Prophylaxis:  SCD  CLINICALLY SIGNIFICANT STUDIES Basic Metabolic Panel:   Recent Labs Lab 06/24/12 0410  06/27/12 0301  06/29/12 0500 06/30/12 0540  NA 152*  < > 153*  < > 156* 154*  K 3.4*  < > 3.6  < > 3.5 3.1*  CL 120*  < > 113*  < > 117* 116*  CO2 23  < > 32  < > 28 28  GLUCOSE 177*  < > 160*  < > 252* 244*  BUN 26*  < > 46*  < > 58* 48*  CREATININE 1.99*  < > 1.46*  < > 1.69* 1.61*  CALCIUM 7.5*  < > 8.5  < > 8.3* 8.3*  MG 2.1  --  2.3  --   --   --   PHOS 1.3*  --  4.4  --   --   --   < > = values in this interval not displayed. Liver Function Tests:   Recent Labs Lab 06/24/12 0410  AST 41*  ALT 20  ALKPHOS 47  BILITOT 0.5  PROT 6.5  ALBUMIN 2.6*   CBC:   Recent Labs Lab 06/27/12 0301 06/29/12 0500  WBC 17.5* 16.3*  NEUTROABS  --  13.7*  HGB 13.1 13.1  HCT 41.5 42.7  MCV 63.7* 64.0*  PLT 213 249   Coagulation:  No results found for this basename: LABPROT, INR,  in the last 168 hours Cardiac Enzymes:   Recent Labs Lab 06/22/12 2216  TROPONINI 10.83*   Urinalysis: No results found for this basename: COLORURINE, APPERANCEUR, LABSPEC, PHURINE, GLUCOSEU, HGBUR, BILIRUBINUR, KETONESUR, PROTEINUR, UROBILINOGEN, NITRITE, LEUKOCYTESUR,  in the last 168 hours Lipid Panel    Component Value Date/Time   CHOL 185 06/22/2012 0525   TRIG 294* 06/28/2012 0600   HDL 58 06/22/2012 0525   CHOLHDL 3.2 06/22/2012 0525   VLDL 18 06/22/2012 0525   LDLCALC 109* 06/22/2012 0525   HgbA1C  Lab Results  Component Value Date   HGBA1C 6.2* 06/24/2012    Urine Drug Screen:   No results found for this basename: labopia,   cocainscrnur,  labbenz,  amphetmu,  thcu,  labbarb    Alcohol Level: No results found for this basename: ETH,  in the last 168 hours  Dg Chest 2 View  06/21/2012   *RADIOLOGY REPORT*  Clinical Data: Cerebral infarction.  CHEST - 2 VIEW  Comparison: 03/07/2011  Findings: Stable moderate cardiomegaly without evidence of pulmonary edema or pleural effusion.  No focal consolidation is identified in the lungs.  The bony thorax is unremarkable.  IMPRESSION: Moderate cardiomegaly.  No active disease.   Original Report Authenticated By: Irish Lack, M.D.   Ct Head Wo Contrast 06/21/2012   Chronic ischemic changes, right greater than left.  No acute abnormality.  06/22/2012 large area of hemorrhage in the left parietal lobe with mass effect and 10 mm midline shift. This is most consistent with hemorrhagic infarction following TPA.  06/24/12 IMPRESSION:  Large left parietal - frontal - posterior left temporal lobe  hemorrhagic infarct and mass effect upon the left lateral ventricle  relatively similar to the recent exam.  Interval development of interventricular blood within the tract  right lateral ventricle.  Subarachnoid blood most notable right convexity and right sylvian  fissure more apparent than on the prior exam.  Bilateral anterior frontal lobe infarcts greater on the left with  minimal associated petechial hemorrhage.  MRI of the brain    MRA of the brain    2D Echocardiogram    Study Conclusions  - Left ventricle: The cavity size was normal. There was moderate concentric hypertrophy. Systolic function was normal. The estimated ejection fraction was in the range of 60% to 65%. Possible mild hypokinesis of the apical myocardium. - Aortic valve: Trivial regurgitation. - Mitral valve: Moderate regurgitation. - Left atrium: The atrium was mildly dilated. - Right ventricle: There was moderate hypertrophy. - Right atrium: The atrium was mildly dilated. - Pulmonary arteries:  Systolic pressure was mildly    Carotid Doppler   Vascular Ultrasound  Carotid Duplex (Doppler) has been completed. Preliminary findings: Technically limited due to right IJ line and patient immobility. No evidence of significant ICA stenosis noted. Antegrade vertebral flow.   CXR  Tip of right jugular line projects over SVC without pneumothorax. Bibasilar atelectasis.   EKG  normal sinus rhythm.   Therapy Recommendations   Physical Exam  General: The patient is intubated, sedated at the time of the examination.  Respiratory: Lung fields are clear.  Abdomen: Abdomen is soft, nontender.  Skin: No significant peripheral edema is noted.   Neurologic Exam  Cranial nerves: Facial symmetry is present. The patient has a gaze preference to the left. The patient is not blink to threat. The eyes cannot be dolled across midline. Pupils are 2 to 3 mm, symmetric. Bilateral L>R corneals. Will open eyes to stimulation, no definite blink to threat.  Motor: Motor tone is increased on all 4 extremities, left greater than right. The patient has increased tone of the left arm, and the arm is in flexion, decorticate with sternal rub. Legs in extension, rigid.  Coordination: patient could not cooperate for cerebellar testing.   Gait and station: The patient could not be ambulated.   Reflexes: Deep tendon reflexes are symmetric, but are somewhat brisk in the legs. Toes are neutral bilaterally.    ASSESSMENT [name] is a 57 y.o. male presenting with expressive aphasia with unintelligible speech output. He is status post IV t-PA full dosage at 2017. symptoms were consistent with left middle cerebral artery acute stroke.  Infarct felt to be embolic, workup underway.  On no antithrombotics prior to admission. Now on no antithrombitics post tPA as followup CT acutely repeated due to acute delirium, tonic-clonic seizure activity in left arm, decreasing consciousness. That scan 06/22/2012 shows  that there is a large 6 cm  area of hemorrhage in the left parietal lobe with mass effect and 10 mm midline shift.  Patient was subsequently intubated for airway protection. 3% saline started for cytotoxic edema.   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  Cerebral edema with midline shift, 3% Saline added, currently held, the sodium is 157  Coma  Hyperlipidemia  Chronic kidney disease, stage 3  Malignant Hypertension, on admission, now normotensive   troponin I elevation 11.15 peak   Hospital day # 8  CT scan now shows some subarachnoid blood, interventricular blood. No significant change in the size of the hemorrhage. Sodium level now 154, hypertonic saline has been held.   Pt has poor prognosis for meaningful recovery.  TREATMENT/PLAN  Continue no antithrombotics due to acute left parietal hemorrhage transformation for secondary stroke prevention.  Continue Current sedation as blood pressure allows to goal of SBP 160.   Appreciate Cardiology evaluation, no intervention at this time. Tube feeds  Supportive care, critical care medicine following.   Further discussion regarding trach/peg.   S/p EEG without any signs of epileptiform activity.   Potassium supplementation.   Long discussion with pt's wife [phone]).  At this point she wants to wait a few days for trach/peg placement. She was also considering transfer to [facility], but when discussing further care with her, she agreed to keep pt. At [facility] for another few days. I believe she has unrealistic goals at this time. He will require LTAC, which I tried to explain but she thinks that he might be able to go home in the future.     Pauletta Browns

## 2012-06-29 NOTE — Progress Notes (Signed)
NUTRITION FOLLOW UP  INTERVENTION: 1. Continue Jevity 1.2 @ 60 ml/hr 2. Increase Prostat to 60 ml BID   TF regimen providing 2128 calories, 139 grams protein, 1166 ml free water. This will meet 98% estimated calorie needs, >100% estimated protein needs.   NUTRITION DIAGNOSIS: Inadequate oral intake related to inability to eat as evidenced by NPO status; ongoing.   Goal: Pt to meet >/= 90% of their estimated nutrition needs, met.   Monitor:  Vent status, TF tolerance, weight trend, labs  ASSESSMENT: Pt with left MCA, received t-PA. CT on 6/13 showed new left ICH with midline shift.  Pt discussed during ICU rounds and with RN.  Per RN family has decided on trach and PEG.  Patient is currently intubated on ventilator support.  MV: 15 L/min Temp:Temp (24hrs), Avg:100.4 F (38 C), Min:97.2 F (36.2 C), Max:101.8 F (38.8 C)  Propofol d/c'ed 6/19 pm  Patient has OG tube in place with tip of tube proximal to mid stomach. Jevity 1.2 is infusing @ 60 ml/hr. 60 ml Prostat via tube BID. Tube feeding regimen currently providing 2028 kcal, 124 grams protein, and 1166 ml H2O.   Free water flushes: 200 ml H2O every 8 hours   Total free water: 1766 ml   Residuals: 0   Height: Ht Readings from Last 1 Encounters:  06/22/12 5\' 11"  (1.803 m)    Weight: Wt Readings from Last 1 Encounters:  06/29/12 164 lb 0.4 oz (74.4 kg)  Admission weight: 171 lb 6/12  BMI:  Body mass index is 22.89 kg/(m^2).  Estimated Nutritional Needs: Kcal: 2161 Protein: 115-130 grams Fluid: > 2 L/day  Skin: no issues noted  Diet Order: NPO   Intake/Output Summary (Last 24 hours) at 06/29/12 1559 Last data filed at 06/29/12 1500  Gross per 24 hour  Intake 3826.5 ml  Output   3835 ml  Net   -8.5 ml    Last BM: 6/14  Labs:   Recent Labs Lab 06/24/12 0410  06/27/12 0301 06/28/12 0600 06/29/12 0500  NA 152*  < > 153* 150* 156*  K 3.4*  < > 3.6 3.2* 3.5  CL 120*  < > 113* 111 117*  CO2 23   < > 32 29 28  BUN 26*  < > 46* 52* 58*  CREATININE 1.99*  < > 1.46* 1.68* 1.69*  CALCIUM 7.5*  < > 8.5 8.0* 8.3*  MG 2.1  --  2.3  --   --   PHOS 1.3*  --  4.4  --   --   GLUCOSE 177*  < > 160* 234* 252*  < > = values in this interval not displayed.  CBG (last 3)   Recent Labs  06/29/12 0009 06/29/12 0432 06/29/12 0811  GLUCAP 243* 203* 210*    Scheduled Meds: . antiseptic oral rinse  15 mL Mouth Rinse QID  . atorvastatin  20 mg Oral q1800  .  ceFAZolin (ANCEF) IV  2 g Intravenous Q8H  . chlorhexidine  15 mL Mouth Rinse BID  . feeding supplement  30 mL Per Tube TID  . free water  200 mL Per Tube Q8H  . insulin aspart  0-9 Units Subcutaneous Q4H  . metoCLOPramide (REGLAN) injection  10 mg Intravenous Q8H  . metoprolol tartrate  25 mg Oral Q6H  . pantoprazole (PROTONIX) IV  40 mg Intravenous QHS  . phenytoin  100 mg Per Tube TID  . sodium chloride  10-40 mL Intracatheter Q12H    Continuous  Infusions: . feeding supplement (JEVITY 1.2 CAL) 1,000 mL (06/28/12 1951)  . midazolam (VERSED) infusion 2 mg/hr (06/29/12 1500)  . niCARDipine 5 mg/hr (06/29/12 1500)    Past Medical History  Diagnosis Date  . Stroke 2007  . Hypertension   . Difficult airway for intubation 06/22/2012    Deep and anterior. Difficult with standard laryngoscope. Easily intubated with Glidescope   . Coronary artery disease     Stent placement    Past Surgical History  Procedure Laterality Date  . Coronary stent placement    . Cardiac surgery      Kendell Bane RD, LDN, CNSC [phone] Pager [phone] After Hours Pager

## 2012-06-29 NOTE — Progress Notes (Signed)
Inpatient Diabetes Program Recommendations  AACE/ADA: New Consensus Statement on Inpatient Glycemic Control (2013)  Target Ranges:  Prepandial:   less than 140 mg/dL      Peak postprandial:   less than 180 mg/dL (1-2 hours)      Critically ill patients:  140 - 180 mg/dL  Results for [name], [name] (MRN [id number]) as of 06/29/2012 09:30  Ref. Range 06/28/2012 12:19 06/28/2012 16:07 06/28/2012 19:39 06/29/2012 00:09 06/29/2012 04:32  Glucose-Capillary Latest Range: 70-99 mg/dL 213 (H) 086 (H) 578 (H) 243 (H) 203 (H)   Inpatient Diabetes Program Recommendations Insulin - Meal Coverage: consider adding Novolog 3 units q 4 hours for tube feed coverage Thank you  Piedad Climes BSN, RN,CDE Inpatient Diabetes Coordinator [phone] (team pager)

## 2012-06-29 NOTE — Progress Notes (Signed)
ANTIBIOTIC CONSULT NOTE - FOLLOW UP  Pharmacy Consult for ancef Indication: PNA  No Known Allergies  Patient Measurements: Height: 5\' 11"  (180.3 cm) Weight: 164 lb 0.4 oz (74.4 kg) IBW/kg (Calculated) : 75.3  Vital Signs: Temp: 97.2 F (36.2 C) (06/20 0900) Temp src: Core (Comment) (06/20 0600) BP: 160/72 mmHg (06/20 0900) Pulse Rate: 86 (06/20 0900) Intake/Output from previous day: 06/19 0701 - 06/20 0700 In: 4939.6 [I.V.:2719.6; NG/GT:1810; IV Piggyback:150] Out: 4090 [Urine:4090] Intake/Output from this shift: Total I/O In: 623.5 [I.V.:383.5; NG/GT:240] Out: 610 [Urine:610]  Labs:  Recent Labs  06/27/12 0301 06/28/12 0600 06/29/12 0500  WBC 17.5*  --  16.3*  HGB 13.1  --  13.1  PLT 213  --  249  CREATININE 1.46* 1.68* 1.69*   Estimated Creatinine Clearance: 50.7 ml/min (by C-G formula based on Cr of 1.69). No results found for this basename: VANCOTROUGH, VANCOPEAK, VANCORANDOM, GENTTROUGH, GENTPEAK, GENTRANDOM, TOBRATROUGH, TOBRAPEAK, TOBRARND, AMIKACINPEAK, AMIKACINTROU, AMIKACIN,  in the last 72 hours     Assessment: 57 yo male with MSSA PNA on ancef.  WBC= 16.3, tmax= 101.8 (currently afebrile), SCr= 1.69, CrCl ~50.  Vanc 6/17>>6/18 Cefazolin 6/18>>  6/15 TA - abundant SA - pan-S 6/12 MRSA - NEG   Plan:  -No ancef dose changes needed -Consider defining length of treatment and adding stop date  Harland German, Ilda Basset D 06/29/2012 12:39 PM

## 2012-06-29 NOTE — Progress Notes (Signed)
PULMONARY  / CRITICAL CARE MEDICINE  Name: [name] MRN: [id number] DOB: July 07, 1955    ADMISSION DATE:  06/21/2012 CONSULTATION DATE:  6/13  REFERRING MD :  sethi  PRIMARY SERVICE: stroke team   CHIEF COMPLAINT:  Asked to see for ventilator mx and supportive critical care services   BRIEF PATIENT DESCRIPTION:  This is 57 year old male admitted 6/12 w/ Left posterior MCA infarct. Got TPA. Found to have AMS the am of 6/13, CT showed new left ICH w/ midline shift. PCCM asked to see for airway support, IV access and supportive critical care.   SIGNIFICANT EVENTS / STUDIES:  CT head 6/12: Chronic ischemic changes, right greater than left. No acute abnormality CT head 6/13: Image quality degraded by significant motion. There is a large area of hemorrhage in the left parietal lobe with mass effect and 10 mm midline shift. This is most consistent with  hemorrhagic infarction following TPA. ECHO 6/14: EF 60-65%, mild apical hypokinesis CT head 6/15: Large left parietal - frontal - posterior left temporal lobe hemorrhagic infarct and mass effect upon the left lateral ventricle relatively similar to the recent exam. Interval development of interventricular blood within the tract right lateral ventricle. EEG 6/19 am >> general slowing, no seizure activity that correlates w tremor  LINES / TUBES: OETT 6/13 (DIFFICULT AIRWAY, Cords swollen, glide scope, #7, very deep)>>> Right IJ 6/13>>> Right radial A-line 6/13 >>>  CULTURES: Sputum 6/15>>> s aureus >> MSSA  ANTIBIOTICS: vanco 6/17 (s aureus sputum) >> 6/18 Ancef 6/18 (MSSA) >>   SUBJECTIVE:  Continues to have rhythmic movement L arm Unresponsive Family mtg today  VITAL SIGNS: Temp:  [97.2 F (36.2 C)-101.8 F (38.8 C)] 97.2 F (36.2 C) (06/20 0900) Pulse Rate:  [80-112] 86 (06/20 0900) Resp:  [20-25] 20 (06/20 0900) BP: (151-184)/(69-95) 160/72 mmHg (06/20 0900) SpO2:  [95 %-100 %] 100 % (06/20 0825) FiO2 (%):  [30 %] 30 %  (06/20 0825) Weight:  [74.4 kg (164 lb 0.4 oz)] 74.4 kg (164 lb 0.4 oz) (06/20 0500) HEMODYNAMICS:   VENTILATOR SETTINGS: Vent Mode:  [-] PRVC FiO2 (%):  [30 %] 30 % Set Rate:  [14 bmp] 14 bmp Vt Set:  [600 mL] 600 mL PEEP:  [5 cmH20] 5 cmH20 Plateau Pressure:  [16 cmH20-19 cmH20] 19 cmH20 INTAKE / OUTPUT: Intake/Output     06/19 0701 - 06/20 0700 06/20 0701 - 06/21 0700   I.V. (mL/kg) 2719.6 (36.6) 229.5 (3.1)   Other 260    NG/GT 1810 180   IV Piggyback 150    Total Intake(mL/kg) 4939.6 (66.4) 409.5 (5.5)   Urine (mL/kg/hr) 4090 (2.3) 360 (1.1)   Total Output 4090 360   Net +849.6 +49.5          PHYSICAL EXAMINATION: General:  57 year old male, obtunded. Does not f/c. Right side flaccid  Neuro:  . Right side is flaccid. Left side appears purposeful at times. Tremor, rhythmic movement L arm noted HEENT:  Orally intubated  Cardiovascular:  rrr Lungs:  Rhonchus respirations  Abdomen:  Soft, non-tender + bowel sounds  Musculoskeletal:  Right side flaccid  Skin:  Intact   LABS:  Recent Labs Lab 06/27/12 0301 06/28/12 0600 06/29/12 0500  NA 153* 150* 156*  K 3.6 3.2* 3.5  CL 113* 111 117*  CO2 32 29 28  BUN 46* 52* 58*  CREATININE 1.46* 1.68* 1.69*  GLUCOSE 160* 234* 252*    Recent Labs Lab 06/26/12 0301 06/27/12 0301 06/29/12 0500  HGB 12.3* 13.1 13.1  HCT 40.3 41.5 42.7  WBC 17.1* 17.5* 16.3*  PLT 204 213 249   CBG (last 3)   Recent Labs  06/28/12 1939 06/29/12 0009 06/29/12 0432  GLUCAP 181* 243* 203*     CXR: 6/18 ETT and R IJ are in satisfactory position. B basilar infiltrates clearing  ASSESSMENT / PLAN:  Principal Problem:   CVA (cerebral infarction) Active Problems:   Difficult airway for intubation   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   Acute respiratory failure - intubated for AMS   Cerebral edema with midline shift   Acute renal insufficiency   Hypertensive crisis with hemorrhagic CVA   Seizure, possible   Hypokalemia   AMI  (acute myocardial infarction)  NEUROLOGIC A:   CVA c/b ICH s/p TPA-->ICH, evolving SAH Cerebral edema Interval development of IVH 6/15, [facility] 6/17 - EEG w no evidence seizure activity P:   - Serial neuro checks - Off 3% saline, Na 156 today, may need to add some IVF or free water on 6/21  - neuro prognosis unfortunately very poor. Dr Anne Hahn and I have spoken w family 6/20. Wife would be open to withdrawal of care but his children from prior marriage want to continue support. After discussion they all agree to pursue trach/PEG and institutionalization, make him DNR in event of a decompensation  PULMONARY A: Acute respiratory failure in setting of acute encephalopathy d/t ICH Difficult airway: required glide scope and bougie/ cords swollen required #7 P:   - Full vent support - Sedation protocol - arrange for trach next week  CARDIOVASCULAR A:  HTN crisis NSTEMI -peak Tp 11 P:  - SBP goal 140-160 in setting of ICH, nicardipine restarted 6/17 pm - cont statin - BB for NSTEMI  RENAL A:   Acute renal failure Hypokalemia Therapeutic Hypernatremia - 3% saline stopped   Recent Labs Lab 06/27/12 0301 06/28/12 0600 06/29/12 0500  NA 153* 150* 156*  K 3.6 3.2* 3.5  CREATININE 1.46* 1.68* 1.69*   P:   - Let Na self correct, may need to add free water on 6/21 - Monitor BMET - Avoid nephrotoxins - holding diuretics  GASTROINTESTINAL A:  No acute issues  P:   -cont tube feeds  -will arrange for PEG next week  HEMATOLOGIC A:  Coagulopathy due to tPA, resolved  P:  - Trend cbc - SCDs  INFECTIOUS A: Low grade fever and leukocytosis, evolving R basilar PNA > MSSA P:   - started vanco 6/17, changed to Ancef 6/18 for MSSA - Trend CBC  ENDOCRINE A:  Minimal hyperglycemia  P:   - Monitor CBG - SSI if needed   TODAY'S SUMMARY: [name] presents following a possible embolic stroke that was treated with IV TPA and had hemorrhagic conversion and now Intraventricular  involvement. C/b NSTEMI and renal failure. Prognosis poor. Family deciding of trach/PEG, spoke w Dr Anne Hahn and Delton Coombes 6/20 >> planning for no escalation but trach/PEG to allow him to go to [facility] or vent-SNF   Care during the described time interval was provided by me and/or other providers on the critical care team.  I have reviewed this patient's available data, including medical history, events of note, physical examination and test results as part of my evaluation  CC time x 45 min  Levy Pupa, MD, PhD 06/29/2012, 11:29 AM [facility] [phone] or if no answer [phone]

## 2012-06-29 NOTE — Progress Notes (Signed)
Labetalol 10 mg IV prn administered for B/P 174/87 HR 104 RR 21. We will continue to monitor.

## 2012-06-29 NOTE — Progress Notes (Signed)
Stroke Team Progress Note  HISTORY  Bartow Zylstra is an 57 y.o. 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 No focal weakness was noted. Patient has been taking aspirin 81 mg per day. CT scan showed no acute intracranial abnormality. NIH stroke score was 8. Patient had marked expressive aphasia as well as gaze preference to the left side and right visual field defect to visual confrontation. He was deemed a candidate for intravenous thrombolytic therapy with TPA.  LSN: [id number] on 06/21/2012  tPA Given: Yes  MRankin: 2  SUBJECTIVE  Patient lying in bed. Intubated and sedated. No one at bedside today.  OBJECTIVE Most recent Vital Signs: Filed Vitals:   06/29/12 0400 06/29/12 0500 06/29/12 0600 06/29/12 0700  BP: 156/76 159/82 154/81 152/75  Pulse: 92 99 86 90  Temp: 100.4 F (38 C) 100.4 F (38 C) 99.9 F (37.7 C) 99.3 F (37.4 C)  TempSrc: Core (Comment)  Core (Comment)   Resp: 24 24 22 22   Height:      Weight:  164 lb 0.4 oz (74.4 kg)    SpO2: 100% 100% 100% 100%   CBG (last 3)   Recent Labs  06/28/12 1939 06/29/12 0009 06/29/12 0432  GLUCAP 181* 243* 203*    IV Fluid Intake:   . feeding supplement (JEVITY 1.2 CAL) 1,000 mL (06/28/12 1951)  . midazolam (VERSED) infusion 2 mg/hr (06/29/12 0017)  . niCARDipine 15 mg/hr (06/29/12 0518)    MEDICATIONS  . antiseptic oral rinse  15 mL Mouth Rinse QID  . atorvastatin  20 mg Oral q1800  .  ceFAZolin (ANCEF) IV  2 g Intravenous Q8H  . chlorhexidine  15 mL Mouth Rinse BID  . feeding supplement  30 mL Per Tube TID  . insulin aspart  0-9 Units Subcutaneous Q4H  . metoCLOPramide (REGLAN) injection  10 mg Intravenous Q8H  . metoprolol  tartrate  12.5 mg Oral Q6H  . pantoprazole (PROTONIX) IV  40 mg Intravenous QHS  . phenytoin  100 mg Per Tube TID  . sodium chloride  10-40 mL Intracatheter Q12H   PRN:  acetaminophen, acetaminophen, artificial tears, fentaNYL, labetalol, LORazepam, metoprolol, midazolam, senna-docusate, sodium chloride  Diet:  NPO tube feeds  Activity:  Bedrest DVT Prophylaxis:  SCD  CLINICALLY SIGNIFICANT STUDIES Basic Metabolic Panel:   Recent Labs Lab 06/24/12 0410  06/27/12 0301 06/28/12 0600 06/29/12 0500  NA 152*  < > 153* 150* 156*  K 3.4*  < > 3.6 3.2* 3.5  CL 120*  < > 113* 111 117*  CO2 23  < > 32 29 28  GLUCOSE 177*  < > 160* 234* 252*  BUN 26*  < > 46* 52* 58*  CREATININE 1.99*  < > 1.46* 1.68* 1.69*  CALCIUM 7.5*  < > 8.5 8.0* 8.3*  MG 2.1  --  2.3  --   --   PHOS 1.3*  --  4.4  --   --   < > = values in this interval not displayed. Liver Function Tests:   Recent Labs Lab 06/24/12 0410  AST 41*  ALT 20  ALKPHOS 47  BILITOT 0.5  PROT 6.5  ALBUMIN 2.6*   CBC:   Recent Labs Lab 06/27/12 0301 06/29/12 0500  WBC 17.5* 16.3*  NEUTROABS  --  PENDING  HGB 13.1 13.1  HCT 41.5 42.7  MCV 63.7* 64.0*  PLT 213 249   Coagulation:  No results found for this basename: LABPROT, INR,  in the last 168 hours Cardiac Enzymes:   Recent Labs Lab 06/22/12 1144 06/22/12 1630 06/22/12 2216  TROPONINI 7.64* 11.15* 10.83*   Urinalysis: No results found for this basename: COLORURINE, APPERANCEUR, LABSPEC, PHURINE, GLUCOSEU, HGBUR, BILIRUBINUR, KETONESUR, PROTEINUR, UROBILINOGEN, NITRITE, LEUKOCYTESUR,  in the last 168 hours Lipid Panel    Component Value Date/Time   CHOL 185 06/22/2012 0525   TRIG 294* 06/28/2012 0600   HDL 58 06/22/2012 0525   CHOLHDL 3.2 06/22/2012 0525   VLDL 18 06/22/2012 0525   LDLCALC 109* 06/22/2012 0525   HgbA1C  Lab Results  Component Value Date   HGBA1C 6.2* 06/24/2012    Urine Drug Screen:   No results found for this basename: labopia,   cocainscrnur,  labbenz,  amphetmu,  thcu,  labbarb    Alcohol Level: No results found for this basename: ETH,  in the last 168 hours  Dg Chest 2 View  06/21/2012   *RADIOLOGY REPORT*  Clinical Data: Cerebral infarction.  CHEST - 2 VIEW  Comparison: 03/07/2011  Findings: Stable moderate cardiomegaly without evidence of pulmonary edema or pleural effusion.  No focal consolidation is identified in the lungs.  The bony thorax is unremarkable.  IMPRESSION: Moderate cardiomegaly.  No active disease.   Original Report Authenticated By: Irish Lack, M.D.   Ct Head Wo Contrast 06/21/2012   Chronic ischemic changes, right greater than left.  No acute abnormality.  06/22/2012 large area of hemorrhage in the left parietal lobe with mass effect and 10 mm midline shift. This is most consistent with hemorrhagic infarction following TPA.  06/24/12 IMPRESSION:  Large left parietal - frontal - posterior left temporal lobe  hemorrhagic infarct and mass effect upon the left lateral ventricle  relatively similar to the recent exam.  Interval development of interventricular blood within the tract  right lateral ventricle.  Subarachnoid blood most notable right convexity and right sylvian  fissure more apparent than on the prior exam.  Bilateral anterior frontal lobe infarcts greater on the left with  minimal associated petechial hemorrhage.  MRI of the brain    MRA of the brain    2D Echocardiogram    Study Conclusions  - Left ventricle: The cavity size was normal. There was moderate concentric hypertrophy. Systolic function was normal. The estimated ejection fraction was in the range of 60% to 65%. Possible mild hypokinesis of the apical myocardium. - Aortic valve: Trivial regurgitation. - Mitral valve: Moderate regurgitation. - Left atrium: The atrium was mildly dilated. - Right ventricle: There was moderate hypertrophy. - Right atrium: The atrium was mildly dilated. - Pulmonary arteries:  Systolic pressure was mildly    Carotid Doppler   Vascular Ultrasound  Carotid Duplex (Doppler) has been completed. Preliminary findings: Technically limited due to right IJ line and patient immobility. No evidence of significant ICA stenosis noted. Antegrade vertebral flow.   CXR  Tip of right jugular line projects over SVC without pneumothorax. Bibasilar atelectasis.   EKG  normal sinus rhythm.   Therapy Recommendations   Physical Exam  General: The patient is intubated, sedated at the time of the examination.  Respiratory: Lung fields are clear.  Abdomen: Abdomen is soft, nontender.  Skin: No significant peripheral edema is noted.   Neurologic Exam  Cranial nerves: Facial symmetry is present. The patient has  a gaze preference to the left. The patient is not blink to threat. The eyes cannot be dolled across midline. Pupils are 2 to 3 mm, symmetric. Bilateral L>R corneals. Will open eyes to stimulation, no definite blink to threat.  Motor: Motor tone is increased on all 4 extremities, left greater than right. The patient has increased tone of the left arm, and the arm is in flexion, decorticate with sternal rub. Legs in extension, rigid.  Coordination: patient could not cooperate for cerebellar testing.   Gait and station: The patient could not be ambulated.   Reflexes: Deep tendon reflexes are symmetric, but are somewhat brisk in the legs. Toes are neutral bilaterally.    ASSESSMENT Mr. Macallan Ord is a 57 y.o. male presenting with expressive aphasia with unintelligible speech output. He is status post IV t-PA full dosage at 2017. symptoms were consistent with left middle cerebral artery acute stroke.  Infarct felt to be embolic, workup underway.  On no antithrombotics prior to admission. Now on no antithrombitics post tPA as followup CT acutely repeated due to acute delirium, tonic-clonic seizure activity in left arm, decreasing consciousness. That scan 06/22/2012 shows  that there is a large 6 cm  area of hemorrhage in the left parietal lobe with mass effect and 10 mm midline shift.  Patient was subsequently intubated for airway protection. 3% saline started for cytotoxic edema.   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  Cerebral edema with midline shift, 3% Saline added, currently held, the sodium is 157  Coma  Hyperlipidemia  Chronic kidney disease, stage 3  Malignant Hypertension, on admission, now normotensive   troponin I elevation 11.15 peak   Hospital day # 8  CT scan now shows some subarachnoid blood, interventricular blood. No significant change in the size of the hemorrhage. Sodium level now 157, hypertonic saline has been held. BUN and creatinine are going up some.  CT done today by my reading shows little change, formal reading is pending. SAH is present over both hemispheres. I discussed the prognosis with the wife. At best, the patient is likely going to require total care for ADL's, with a significant aphasia. The wife has not yet decided about trach and peg, and she wants ongoing full code status.   TREATMENT/PLAN  Continue no antithrombotics due to acute left parietal hemorrhage transformation for secondary stroke prevention.  Continue Current sedation as blood pressure allows to goal of SBP 160.   Cardiology evaluation, the patient has been seen by Dr.  Sharyn Lull in the past according to the wife Cardiology has seen, no acute management for now  Tube feeds  Supportive care, critical care medicine following ? Need for trach and PEG  EEG done yesterday did not show epileptiform activity. The left UE tremor is likely related to spasticity from He is on dilantin. Prognosis in this case is poor. I discussed the prognosis with the wife, and I indicated that I think the clinical exam is worsening. No decision about trach and peg yet.   Plans  for a family meeting today at 10 am.    Lesly Dukes  06/29/2012 7:28 AM

## 2012-06-29 NOTE — Significant Event (Signed)
Pt with intermittent SVT.  Will increase lopressor to 25 mg q6h.  Adjust cardene as needed to keep SBP 140-160.  Coralyn Helling, MD 06/29/2012, 3:56 PM

## 2012-06-30 LAB — GLUCOSE, CAPILLARY
Glucose-Capillary: 251 mg/dL — ABNORMAL HIGH (ref 70–99)
Glucose-Capillary: 199 mg/dL — ABNORMAL HIGH (ref 70–99)

## 2012-06-30 LAB — BASIC METABOLIC PANEL
CO2: 28 mEq/L (ref 19–32)
Calcium: 8.3 mg/dL — ABNORMAL LOW (ref 8.4–10.5)
Chloride: 116 mEq/L — ABNORMAL HIGH (ref 96–112)
Potassium: 3.1 mEq/L — ABNORMAL LOW (ref 3.5–5.1)
Sodium: 154 mEq/L — ABNORMAL HIGH (ref 135–145)

## 2012-06-30 MED ORDER — POTASSIUM CHLORIDE 20 MEQ PO PACK
40.0000 meq | PACK | Freq: Two times a day (BID) | ORAL | Status: DC
  Filled 2012-06-30 (×2): qty 2

## 2012-06-30 MED ORDER — ACETAMINOPHEN 650 MG RE SUPP
650.0000 mg | RECTAL | Status: DC | PRN
  Administered 2012-07-06 – 2012-07-15 (×14): 650 mg via RECTAL
  Filled 2012-06-30 (×16): qty 1

## 2012-06-30 MED ORDER — PANTOPRAZOLE SODIUM 40 MG PO PACK
40.0000 mg | PACK | ORAL | Status: DC
  Administered 2012-06-30 – 2012-07-06 (×7): 40 mg
  Filled 2012-06-30 (×9): qty 20

## 2012-06-30 MED ORDER — POTASSIUM CHLORIDE 20 MEQ/15ML (10%) PO LIQD
40.0000 meq | Freq: Two times a day (BID) | ORAL | Status: DC
  Administered 2012-06-30 – 2012-07-06 (×13): 40 meq via ORAL
  Filled 2012-06-30 (×14): qty 30

## 2012-06-30 MED ORDER — ACETAMINOPHEN 325 MG PO TABS
650.0000 mg | ORAL_TABLET | ORAL | Status: DC | PRN
  Administered 2012-06-30 – 2012-07-16 (×13): 650 mg
  Filled 2012-06-30 (×3): qty 2
  Filled 2012-06-30: qty 1
  Filled 2012-06-30 (×6): qty 2
  Filled 2012-06-30: qty 1
  Filled 2012-06-30 (×3): qty 2

## 2012-06-30 MED ORDER — POTASSIUM CHLORIDE 20 MEQ/15ML (10%) PO LIQD
40.0000 meq | Freq: Once | ORAL | Status: AC
  Administered 2012-06-30: 40 meq
  Filled 2012-06-30: qty 30

## 2012-06-30 NOTE — Progress Notes (Signed)
eLink Physician-Brief Progress Note Patient Name: [name] DOB: 1955-04-26 MRN: [id number]  Date of Service  06/30/2012   HPI/Events of Note  Hypokalemia   eICU Interventions  Potassium replaced   Intervention Category Minor Interventions: Electrolytes abnormality - evaluation and management  Eraina Winnie 06/30/2012, 6:37 AM

## 2012-06-30 NOTE — Progress Notes (Signed)
PULMONARY  / CRITICAL CARE MEDICINE  Name: Shemuel Harkleroad MRN: [id number] DOB: 1955-11-25    ADMISSION DATE:  06/21/2012 CONSULTATION DATE:  6/13  REFERRING MD :  sethi  PRIMARY SERVICE: stroke team   CHIEF COMPLAINT:  Asked to see for ventilator mx and supportive critical care services   BRIEF PATIENT DESCRIPTION:  This is 57 year old male admitted 6/12 w/ Left posterior MCA infarct. Got TPA. Found to have AMS the am of 6/13, CT showed new left ICH w/ midline shift. PCCM asked to see for airway support, IV access and supportive critical care.   SIGNIFICANT EVENTS / STUDIES:  CT head 6/12: Chronic ischemic changes, right greater than left. No acute abnormality CT head 6/13: Image quality degraded by significant motion. There is a large area of hemorrhage in the left parietal lobe with mass effect and 10 mm midline shift. This is most consistent with  hemorrhagic infarction following TPA. ECHO 6/14: EF 60-65%, mild apical hypokinesis CT head 6/15: Large left parietal - frontal - posterior left temporal lobe hemorrhagic infarct and mass effect upon the left lateral ventricle relatively similar to the recent exam. Interval development of interventricular blood within the tract right lateral ventricle. EEG 6/19 am >> general slowing, no seizure activity that correlates w tremor  LINES / TUBES: OETT 6/13 (DIFFICULT AIRWAY, Cords swollen, glide scope, #7, very deep)>>> Right IJ 6/13>>> Right radial A-line 6/13 >>>  CULTURES: Sputum 6/15>>> s aureus >> MSSA  ANTIBIOTICS: vanco 6/17 (s aureus sputum) >> 6/18 Ancef 6/18 (MSSA) >>   SUBJECTIVE:  Tolerates pressure support.  VITAL SIGNS: Temp:  [94.8 F (34.9 C)-100.9 F (38.3 C)] 100.4 F (38 C) (06/21 0635) Pulse Rate:  [81-105] 98 (06/21 0635) Resp:  [16-25] 24 (06/21 0635) BP: (139-179)/(64-95) 164/95 mmHg (06/21 0635) SpO2:  [98 %-100 %] 98 % (06/21 0635) FiO2 (%):  [30 %] 30 % (06/21 0311) VENTILATOR SETTINGS: Vent Mode:  [-]  PRVC FiO2 (%):  [30 %] 30 % Set Rate:  [14 bmp] 14 bmp Vt Set:  [600 mL] 600 mL PEEP:  [5 cmH20] 5 cmH20 Plateau Pressure:  [17 cmH20-22 cmH20] 21 cmH20 INTAKE / OUTPUT: Intake/Output     06/20 0701 - 06/21 0700 06/21 0701 - 06/22 0700   I.V. (mL/kg) 845.5 (11.4)    Other     NG/GT 1520    IV Piggyback 100    Total Intake(mL/kg) 2465.5 (33.1)    Urine (mL/kg/hr) 5520 (3.1)    Total Output 5520     Net -3054.5            PHYSICAL EXAMINATION: General:  57 year old male, obtunded. Does not f/c. Neuro:  . Right side is flaccid. Left side appears purposeful at times. HEENT:  Orally intubated  Cardiovascular:  rrr Lungs:  Rhonchus respirations  Abdomen:  Soft, non-tender + bowel sounds  Musculoskeletal: no edema Skin:  Intact   LABS:  Recent Labs Lab 06/28/12 0600 06/29/12 0500 06/30/12 0540  NA 150* 156* 154*  K 3.2* 3.5 3.1*  CL 111 117* 116*  CO2 29 28 28   BUN 52* 58* 48*  CREATININE 1.68* 1.69* 1.61*  GLUCOSE 234* 252* 244*    Recent Labs Lab 06/26/12 0301 06/27/12 0301 06/29/12 0500  HGB 12.3* 13.1 13.1  HCT 40.3 41.5 42.7  WBC 17.1* 17.5* 16.3*  PLT 204 213 249   CBG (last 3)   Recent Labs  06/29/12 2007 06/30/12 0051 06/30/12 0409  GLUCAP 181* 251* 199*  Imaging: Dg Abd Portable 1v  06/28/2012   *RADIOLOGY REPORT*  Clinical Data: Orogastric tube placement.  PORTABLE ABDOMEN - 1 VIEW  Comparison: 06/22/2012.  Findings: Orogastric tube tip lies along the greater curvature of the stomach.  Moderate distention of small bowel loops is noted, increased from priors.  IMPRESSION: Dilated small bowel loops could represent partial S B O versus ileus.  Correlate clinically.  Orogastric tube tip in good position.   Original Report Authenticated By: Davonna Belling, M.D.     ASSESSMENT / PLAN:   NEUROLOGIC A:   CVA c/b ICH s/p TPA-->ICH, evolving SAH Cerebral edema Interval development of IVH 6/15, [facility] 6/17 - EEG w no evidence seizure activity P:    - Serial neuro checks - Off 3% saline, Na 156 today, may need to add some IVF or free water on 6/21 - dilantin for seizure prevention   PULMONARY A: Acute respiratory failure in setting of acute encephalopathy d/t ICH Difficult airway: required glide scope and bougie/ cords swollen required #7 P:   - pressure support wean as tolerated - Sedation protocol - arrange for trach next week  CARDIOVASCULAR A:  HTN crisis NSTEMI -peak Tp 11 P:  - SBP goal 140-160 in setting of ICH, nicardipine restarted 6/17 pm - cont statin - BB for NSTEMI  RENAL A:   Acute renal failure Hypokalemia Therapeutic Hypernatremia - 3% saline stopped  P:   - Let Na self correct, may need to add free water on 6/21 - Monitor BMET - Avoid nephrotoxins - holding diuretics  GASTROINTESTINAL A:  Nutrition P:   -cont tube feeds  -will arrange for PEG next week -continue reglan for gastric residuals  HEMATOLOGIC A:  Coagulopathy due to tPA, resolved  P:  - Trend cbc - SCDs  INFECTIOUS A: Low grade fever and leukocytosis, evolving R basilar PNA > MSSA P:   - started vanco 6/17, changed to Ancef 6/18 for MSSA  ENDOCRINE A:  Minimal hyperglycemia  P:   - Monitor CBG - SSI if needed   TODAY'S SUMMARY: Mr. Bouillon presents following a possible embolic stroke that was treated with IV TPA and had hemorrhagic conversion and now Intraventricular involvement. C/b NSTEMI and renal failure. Prognosis poor. Family deciding of trach/PEG, spoke w Dr Anne Hahn and Delton Coombes 6/20 >> planning for no escalation but trach/PEG to allow him to go to LTAC or vent-SNF  CC time 35 minutes.  Updated wife at bedside.  She is asking whether transfer to [facility] would be better option for pt >> asked her to refer this question to neurology.  Coralyn Helling, MD [facility] Pulmonary/Critical Care 06/30/2012, 7:31 AM Pager:  [phone] After 3pm call: [phone]

## 2012-07-01 ENCOUNTER — Inpatient Hospital Stay (HOSPITAL_COMMUNITY): Payer: Medicare Other

## 2012-07-01 LAB — BASIC METABOLIC PANEL
BUN: 48 mg/dL — ABNORMAL HIGH (ref 6–23)
CO2: 26 mEq/L (ref 19–32)
Chloride: 121 mEq/L — ABNORMAL HIGH (ref 96–112)
GFR calc non Af Amer: 51 mL/min — ABNORMAL LOW (ref >90)
Glucose, Bld: 187 mg/dL — ABNORMAL HIGH (ref 70–99)
Potassium: 3.5 mEq/L (ref 3.5–5.1)

## 2012-07-01 LAB — CBC
HCT: 41.3 % (ref 39.0–52.0)
Hemoglobin: 12.6 g/dL — ABNORMAL LOW (ref 13.0–17.0)
MCH: 19.8 pg — ABNORMAL LOW (ref 26.0–34.0)
MCHC: 30.5 g/dL (ref 30.0–36.0)
MCV: 65 fL — ABNORMAL LOW (ref 78.0–100.0)

## 2012-07-01 LAB — GLUCOSE, CAPILLARY
Glucose-Capillary: 236 mg/dL — ABNORMAL HIGH (ref 70–99)
Glucose-Capillary: 174 mg/dL — ABNORMAL HIGH (ref 70–99)
Glucose-Capillary: 199 mg/dL — ABNORMAL HIGH (ref 70–99)

## 2012-07-01 MED ORDER — MIDAZOLAM HCL 2 MG/2ML IJ SOLN
2.0000 mg | INTRAMUSCULAR | Status: DC | PRN
  Administered 2012-07-04 – 2012-07-05 (×2): 2 mg via INTRAVENOUS
  Filled 2012-07-01 (×2): qty 2

## 2012-07-01 MED ORDER — METOPROLOL TARTRATE 25 MG PO TABS: 25.0000 mg | ORAL_TABLET | Freq: Two times a day (BID) | ORAL | Status: DC

## 2012-07-01 MED ORDER — SODIUM CHLORIDE 0.9 % IV SOLN
INTRAVENOUS | Status: DC
  Administered 2012-07-01: 12:00:00 via INTRAVENOUS
  Administered 2012-07-01: 250 mL via INTRAVENOUS
  Administered 2012-07-10 – 2012-07-11 (×2): via INTRAVENOUS

## 2012-07-01 MED ORDER — AMLODIPINE BESYLATE 5 MG PO TABS
5.0000 mg | ORAL_TABLET | Freq: Every day | ORAL | Status: DC
  Administered 2012-07-01 – 2012-07-02 (×2): 5 mg
  Filled 2012-07-01 (×2): qty 1

## 2012-07-01 MED ORDER — FENTANYL CITRATE 0.05 MG/ML IJ SOLN
50.0000 ug | INTRAMUSCULAR | Status: DC | PRN
  Administered 2012-07-01 (×2): 100 ug via INTRAVENOUS
  Administered 2012-07-01: 50 ug via INTRAVENOUS
  Administered 2012-07-01 – 2012-07-17 (×30): 100 ug via INTRAVENOUS
  Filled 2012-07-01 (×34): qty 2

## 2012-07-01 NOTE — Progress Notes (Signed)
PULMONARY  / CRITICAL CARE MEDICINE  Name: [name] MRN: [id number] DOB: 1955-03-26    ADMISSION DATE:  06/21/2012 CONSULTATION DATE:  6/13  REFERRING MD :  sethi  PRIMARY SERVICE: stroke team   CHIEF COMPLAINT:  Asked to see for ventilator mx and supportive critical care services   BRIEF PATIENT DESCRIPTION:  This is 57 year old male admitted 6/12 w/ Left posterior MCA infarct. Got TPA. Found to have AMS the am of 6/13, CT showed new left ICH w/ midline shift. PCCM asked to see for airway support, IV access and supportive critical care.   SIGNIFICANT EVENTS / STUDIES:  CT head 6/12: Chronic ischemic changes, right greater than left. No acute abnormality CT head 6/13: Image quality degraded by significant motion. There is a large area of hemorrhage in the left parietal lobe with mass effect and 10 mm midline shift. This is most consistent with  hemorrhagic infarction following TPA. ECHO 6/14: EF 60-65%, mild apical hypokinesis CT head 6/15: Large left parietal - frontal - posterior left temporal lobe hemorrhagic infarct and mass effect upon the left lateral ventricle relatively similar to the recent exam. Interval development of interventricular blood within the tract right lateral ventricle. EEG 6/19 am >> general slowing, no seizure activity that correlates w tremor  LINES / TUBES: OETT 6/13 (DIFFICULT AIRWAY, Cords swollen, glide scope, #7, very deep)>>> Right IJ 6/13>>> Right radial A-line 6/13 >>>  CULTURES: Sputum 6/15>>> s aureus >> MSSA  ANTIBIOTICS: vanco 6/17 (s aureus sputum) >> 6/18 Ancef 6/18 (MSSA) >>   SUBJECTIVE:  Tolerates pressure support.  VITAL SIGNS: Temp:  [98.4 F (36.9 C)-100.9 F (38.3 C)] 99.7 F (37.6 C) (06/22 0748) Pulse Rate:  [77-108] 83 (06/22 0748) Resp:  [16-28] 24 (06/22 0748) BP: (137-181)/(70-99) 160/79 mmHg (06/22 0748) SpO2:  [97 %-100 %] 100 % (06/22 0748) FiO2 (%):  [30 %] 30 % (06/22 0748) Weight:  [165 lb 12.6 oz (75.2 kg)]  165 lb 12.6 oz (75.2 kg) (06/22 0500) VENTILATOR SETTINGS: Vent Mode:  [-] PSV;CPAP;Spontaneous FiO2 (%):  [30 %] 30 % Set Rate:  [14 bmp-27 bmp] 27 bmp Vt Set:  [600 mL] 600 mL PEEP:  [5 cmH20] 5 cmH20 Pressure Support:  [8 cmH20] 8 cmH20 Plateau Pressure:  [20 cmH20-23 cmH20] 23 cmH20 INTAKE / OUTPUT: Intake/Output     06/21 0701 - 06/22 0700 06/22 0701 - 06/23 0700   I.V. (mL/kg) 2251 (29.9)    NG/GT 1790    IV Piggyback 150    Total Intake(mL/kg) 4191 (55.7)    Urine (mL/kg/hr) 3950 (2.2)    Total Output 3950     Net +241            PHYSICAL EXAMINATION: General: No distress Neuro:  Unresponsive HEENT: ETT in place  Cardiovascular:  regular Lungs: no wheeze Abdomen: soft, non tender, good bowel sounds  Musculoskeletal: no edema Skin:  Intact   LABS:  Recent Labs Lab 06/29/12 0500 06/30/12 0540 07/01/12 0400  NA 156* 154* 156*  K 3.5 3.1* 3.5  CL 117* 116* 121*  CO2 28 28 26   BUN 58* 48* 48*  CREATININE 1.69* 1.61* 1.48*  GLUCOSE 252* 244* 187*    Recent Labs Lab 06/27/12 0301 06/29/12 0500 07/01/12 0400  HGB 13.1 13.1 12.6*  HCT 41.5 42.7 41.3  WBC 17.5* 16.3* 17.8*  PLT 213 249 295   CBG (last 3)   Recent Labs  06/30/12 2040 07/01/12 0020 07/01/12 0406  GLUCAP 192* 255* 163*  Imaging: No results found.   ASSESSMENT / PLAN:   NEUROLOGIC A:   CVA c/b ICH s/p TPA-->ICH, evolving SAH Cerebral edema >> off 3% NS Interval development of IVH 6/15, [facility] 6/17 - EEG w no evidence seizure activity P:   - Serial neuro checks - dilantin for seizure prevention - change to intermittent sedation   PULMONARY A: Acute respiratory failure in setting of acute encephalopathy d/t ICH Difficult airway: required glide scope and bougie/ cords swollen required #7 P:   - pressure support wean as tolerated >> mental status precludes extubation - arrange for trach next week  CARDIOVASCULAR A:  HTN crisis NSTEMI -peak Tp 11 P:  - SBP goal  140-160 in setting of ICH, nicardipine restarted 6/17 pm - continue statin - BB for NSTEMI  RENAL A:   Acute renal failure >> improving Hypokalemia Hypernatremia  P:   - free water - Monitor BMET - Avoid nephrotoxins - holding diuretics  GASTROINTESTINAL A:  Nutrition. Ileus >> improved. P:   -continue tube feeds  -will arrange for PEG next week -continue reglan for now  HEMATOLOGIC A:  Coagulopathy due to tPA, resolved  P:  - Trend cbc - SCDs  INFECTIOUS A: R basilar PNA > MSSA P:   - D5/x Abx, current on ancef  ENDOCRINE A:  Minimal hyperglycemia  P:   - Monitor CBG - SSI if needed   Summary: Prognosis for neurologic recover seems poor.  Family wishes to continue aggressive care.  Will need to arrange for Trach/PEG later in week.  Coralyn Helling, MD [facility] Pulmonary/Critical Care 07/01/2012, 7:57 AM Pager:  [phone] After 3pm call: [phone]

## 2012-07-01 NOTE — Progress Notes (Signed)
Stroke Team Progress Note  HISTORY  [name] is an 57 y.o. 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 No focal weakness was noted. Patient has been taking aspirin 81 mg per day. CT scan showed no acute intracranial abnormality. NIH stroke score was 8. Patient had marked expressive aphasia as well as gaze preference to the left side and right visual field defect to visual confrontation. He was deemed a candidate for intravenous thrombolytic therapy with TPA.  LSN: [id number] on 06/21/2012  tPA Given: Yes  MRankin: 2  SUBJECTIVE  Patient lying in bed. Intubated and sedated. No one at bedside today.  OBJECTIVE Most recent Vital Signs: Filed Vitals:   07/01/12 0523 07/01/12 0600 07/01/12 0700 07/01/12 0748  BP: 164/83 154/75 153/78 160/79  Pulse: 85 77 85 83  Temp:  99.5 F (37.5 C) 99.7 F (37.6 C) 99.7 F (37.6 C)  TempSrc:    Core (Comment)  Resp:  19 21 24   Height:      Weight:      SpO2:  100% 100% 100%   CBG (last 3)   Recent Labs  06/30/12 2040 07/01/12 0020 07/01/12 0406  GLUCAP 192* 255* 163*    IV Fluid Intake:   . sodium chloride 20 mL/hr at 07/01/12 0700  . feeding supplement (JEVITY 1.2 CAL) 1,000 mL (06/30/12 1333)  . niCARDipine 15 mg/hr (07/01/12 0841)    MEDICATIONS  . antiseptic oral rinse  15 mL Mouth Rinse QID  . atorvastatin  20 mg Oral q1800  .  ceFAZolin (ANCEF) IV  2 g Intravenous Q8H  . chlorhexidine  15 mL Mouth Rinse BID  . feeding supplement  60 mL Per Tube BID  . free water  200 mL Per Tube Q8H  . insulin aspart  0-9 Units Subcutaneous Q4H  . metoCLOPramide (REGLAN) injection  10 mg Intravenous Q8H  . metoprolol tartrate  25 mg Oral Q6H  .  pantoprazole sodium  40 mg Per Tube Q24H  . phenytoin  100 mg Per Tube TID  . potassium chloride  40 mEq Oral BID  . sodium chloride  10-40 mL Intracatheter Q12H   PRN:  acetaminophen, acetaminophen, artificial tears, fentaNYL, labetalol, LORazepam, metoprolol, midazolam, senna-docusate, sodium chloride  Diet:  NPO tube feeds  Activity:  Bedrest DVT Prophylaxis:  SCD  CLINICALLY SIGNIFICANT STUDIES Basic Metabolic Panel:   Recent Labs Lab 06/27/12 0301  06/30/12 0540 07/01/12 0400  NA 153*  < > 154* 156*  K 3.6  < > 3.1* 3.5  CL 113*  < > 116* 121*  CO2 32  < > 28 26  GLUCOSE 160*  < > 244* 187*  BUN 46*  < > 48* 48*  CREATININE 1.46*  < > 1.61* 1.48*  CALCIUM 8.5  < > 8.3* 8.4  MG 2.3  --   --   --   PHOS 4.4  --   --   --   < > = values in this interval not displayed. Liver Function Tests:  No results found for this basename: AST, ALT, ALKPHOS, BILITOT, PROT, ALBUMIN,  in the last 168 hours CBC:   Recent Labs Lab 06/29/12 0500 07/01/12 0400  WBC 16.3* 17.8*  NEUTROABS 13.7*  --   HGB 13.1 12.6*  HCT 42.7 41.3  MCV 64.0* 65.0*  PLT 249 295   Coagulation:  No results found for this basename: LABPROT, INR,  in the last 168 hours Cardiac Enzymes:  No results found for this basename: CKTOTAL, CKMB, CKMBINDEX, TROPONINI,  in the last 168 hours Urinalysis: No results found for this basename: COLORURINE, APPERANCEUR, LABSPEC, PHURINE, GLUCOSEU, HGBUR, BILIRUBINUR, KETONESUR, PROTEINUR, UROBILINOGEN, NITRITE, LEUKOCYTESUR,  in the last 168 hours Lipid Panel    Component Value Date/Time   CHOL 185 06/22/2012 0525   TRIG 294* 06/28/2012 0600   HDL 58 06/22/2012 0525   CHOLHDL 3.2 06/22/2012 0525   VLDL 18 06/22/2012 0525   LDLCALC 109* 06/22/2012 0525   HgbA1C  Lab Results  Component Value Date   HGBA1C 6.2* 06/24/2012    Urine Drug Screen:   No results found for this basename: labopia,  cocainscrnur,  labbenz,  amphetmu,  thcu,  labbarb    Alcohol Level: No  results found for this basename: ETH,  in the last 168 hours  Dg Chest 2 View  06/21/2012   *RADIOLOGY REPORT*  Clinical Data: Cerebral infarction.  CHEST - 2 VIEW  Comparison: 03/07/2011  Findings: Stable moderate cardiomegaly without evidence of pulmonary edema or pleural effusion.  No focal consolidation is identified in the lungs.  The bony thorax is unremarkable.  IMPRESSION: Moderate cardiomegaly.  No active disease.   Original Report Authenticated By: Irish Lack, M.D.   Ct Head Wo Contrast 06/21/2012   Chronic ischemic changes, right greater than left.  No acute abnormality.  06/22/2012 large area of hemorrhage in the left parietal lobe with mass effect and 10 mm midline shift. This is most consistent with hemorrhagic infarction following TPA.  06/24/12 IMPRESSION:  Large left parietal - frontal - posterior left temporal lobe  hemorrhagic infarct and mass effect upon the left lateral ventricle  relatively similar to the recent exam.  Interval development of interventricular blood within the tract  right lateral ventricle.  Subarachnoid blood most notable right convexity and right sylvian  fissure more apparent than on the prior exam.  Bilateral anterior frontal lobe infarcts greater on the left with  minimal associated petechial hemorrhage.  MRI of the brain    MRA of the brain    2D Echocardiogram    Study Conclusions  - Left ventricle: The cavity size was normal. There was moderate concentric hypertrophy. Systolic function was normal. The estimated ejection fraction was in the range of 60% to 65%. Possible mild hypokinesis of the apical myocardium. - Aortic valve: Trivial regurgitation. - Mitral valve: Moderate regurgitation. - Left atrium: The atrium was mildly dilated. - Right ventricle: There was moderate hypertrophy. - Right atrium: The atrium was mildly dilated. - Pulmonary arteries: Systolic pressure was mildly    Carotid Doppler   Vascular Ultrasound   Carotid Duplex (Doppler) has been completed. Preliminary findings: Technically limited due to right IJ line and patient immobility. No evidence of significant ICA stenosis noted. Antegrade vertebral flow.   CXR  Tip of right jugular line projects over SVC without pneumothorax. Bibasilar atelectasis.   EKG  normal sinus rhythm.   Therapy Recommendations   Physical Exam  General: The patient is intubated, sedated at the time of the examination.  Respiratory: Lung fields are clear.  Abdomen: Abdomen is soft, nontender.  Skin: No significant peripheral edema is noted.   Neurologic Exam  Cranial nerves: Facial symmetry is present. The patient has a gaze preference to the left. The patient is not blink  to threat. The eyes cannot be dolled across midline. Pupils are 2 to 3 mm, symmetric. Bilateral L>R corneals. Will open eyes to stimulation, no definite blink to threat.  Motor: Motor tone is increased on all 4 extremities, left greater than right. The patient has increased tone of the left arm, and the arm is in flexion, decorticate with sternal rub. Legs in extension, rigid.  Coordination: patient could not cooperate for cerebellar testing.   Gait and station: The patient could not be ambulated.   Reflexes: Deep tendon reflexes are symmetric, but are somewhat brisk in the legs. Toes are neutral bilaterally.    ASSESSMENT [name] is a 57 y.o. male presenting with expressive aphasia with unintelligible speech output. He is status post IV t-PA full dosage at 2017. symptoms were consistent with left middle cerebral artery acute stroke.  Infarct felt to be embolic, workup underway.  On no antithrombotics prior to admission. Now on no antithrombitics post tPA as followup CT acutely repeated due to acute delirium, tonic-clonic seizure activity in left arm, decreasing consciousness. That scan 06/22/2012 shows that there is a large 6 cm  area of hemorrhage in the left parietal lobe with  mass effect and 10 mm midline shift.  Patient was subsequently intubated for airway protection. 3% saline started for cytotoxic edema.   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  Cerebral edema with midline shift, 3% Saline added, currently held, the sodium is 157  Coma  Hyperlipidemia  Chronic kidney disease, stage 3  Malignant Hypertension, on admission, now normotensive   troponin I elevation 11.15 peak   Hospital day # 10  CT scan now shows some subarachnoid blood, interventricular blood. No significant change in the size of the hemorrhage. Sodium level now 154, hypertonic saline has been held.   Pt has poor prognosis for meaningful recovery.  TREATMENT/PLAN  Continue Current sedation as blood pressure allows to goal of SBP 160.   Back on Cardene gtt, start labetalol 25 q12 today.   Appreciate Cardiology evaluation, no intervention at this time. Tube feeds  Supportive care, critical care medicine following.   Further discussion regarding trach/peg early next week, see below  S/p EEG without any signs of epileptiform activity. On dilatnin  S/p Potassium supplementation. Hypernatremia with free water flushes.   Long discussion with pt's wife yesterday [phone]) as well as daughter who was at bedside.  At this point they want to wait a few days for trach/peg placement. She was also considering transfer to [facility], but when discussing further care with her, she agreed to keep pt. At [facility] for another few days. I believe she has unrealistic goals at this time. He will require LTAC, which I tried to explain but she thinks that he might be able to go home in the future.     Pauletta Browns

## 2012-07-02 LAB — BASIC METABOLIC PANEL
CO2: 26 mEq/L (ref 19–32)
Calcium: 8.4 mg/dL (ref 8.4–10.5)
Creatinine, Ser: 1.63 mg/dL — ABNORMAL HIGH (ref 0.50–1.35)
GFR calc non Af Amer: 45 mL/min — ABNORMAL LOW (ref >90)
Sodium: 155 mEq/L — ABNORMAL HIGH (ref 135–145)

## 2012-07-02 LAB — CBC
MCH: 19.1 pg — ABNORMAL LOW (ref 26.0–34.0)
MCV: 65.4 fL — ABNORMAL LOW (ref 78.0–100.0)
Platelets: 300 10*3/uL (ref 150–400)
RDW: 19.9 % — ABNORMAL HIGH (ref 11.5–15.5)
WBC: 19.8 10*3/uL — ABNORMAL HIGH (ref 4.0–10.5)

## 2012-07-02 LAB — GLUCOSE, CAPILLARY: Glucose-Capillary: 188 mg/dL — ABNORMAL HIGH (ref 70–99)

## 2012-07-02 MED ORDER — NICARDIPINE HCL IN NACL 40-0.83 MG/200ML-% IV SOLN: 5.0000 mg/h | INTRAVENOUS | Status: DC

## 2012-07-02 MED ORDER — AMLODIPINE BESYLATE 10 MG PO TABS
10.0000 mg | ORAL_TABLET | Freq: Every day | ORAL | Status: DC
  Administered 2012-07-03 – 2012-07-06 (×4): 10 mg
  Filled 2012-07-02 (×4): qty 1

## 2012-07-02 NOTE — Progress Notes (Signed)
Inpatient Diabetes Program Recommendations  AACE/ADA: New Consensus Statement on Inpatient Glycemic Control (2013)  Target Ranges:  Prepandial:   less than 140 mg/dL      Peak postprandial:   less than 180 mg/dL (1-2 hours)      Critically ill patients:  140 - 180 mg/dL  Results for [name], [name] (MRN [id number]) as of 07/02/2012 13:57  Ref. Range 07/01/2012 20:46 07/02/2012 00:05 07/02/2012 04:23 07/02/2012 08:07 07/02/2012 12:17  Glucose-Capillary Latest Range: 70-99 mg/dL 829 (H) 562 (H) 130 (H) 240 (H) 257 (H)   Inpatient Diabetes Program Recommendations Insulin - Meal Coverage: consider adding Novolog 3 units q 4 hours for tube feed coverage Thank you  Piedad Climes BSN, RN,CDE Inpatient Diabetes Coordinator [phone] (team pager)

## 2012-07-02 NOTE — Progress Notes (Addendum)
Pt placed on SBT and is tolerating well att this time with no complications noted. RT will monitor,

## 2012-07-02 NOTE — Progress Notes (Signed)
PULMONARY  / CRITICAL CARE MEDICINE  Name: [name] MRN: [id number] DOB: 1955-03-31    ADMISSION DATE:  06/21/2012 CONSULTATION DATE:  6/13  REFERRING MD :  sethi  PRIMARY SERVICE: stroke team   CHIEF COMPLAINT:  Asked to see for ventilator mx and supportive critical care services   BRIEF PATIENT DESCRIPTION:  This is 57 year old male admitted 6/12 w/ Left posterior MCA infarct. Got TPA. Found to have AMS the am of 6/13, CT showed new left ICH w/ midline shift. PCCM asked to see for airway support, IV access and supportive critical care.   SIGNIFICANT EVENTS / STUDIES:  CT head 6/12: Chronic ischemic changes, right greater than left. No acute abnormality CT head 6/13: Image quality degraded by significant motion. There is a large area of hemorrhage in the left parietal lobe with mass effect and 10 mm midline shift. This is most consistent with  hemorrhagic infarction following TPA. ECHO 6/14: EF 60-65%, mild apical hypokinesis CT head 6/15: Large left parietal - frontal - posterior left temporal lobe hemorrhagic infarct and mass effect upon the left lateral ventricle relatively similar to the recent exam. Interval development of interventricular blood within the tract right lateral ventricle. EEG 6/19 am >> general slowing, no seizure activity that correlates w tremor  LINES / TUBES: OETT 6/13 (DIFFICULT AIRWAY, Cords swollen, glide scope, #7, very deep)>>> Right IJ 6/13>>> Right radial A-line 6/13 >>>  CULTURES: Sputum 6/15>>> s aureus >> MSSA  ANTIBIOTICS: vanco 6/17 (s aureus sputum) >> 6/18 Ancef 6/18 (MSSA) >>   SUBJECTIVE:  Tolerates pressure support.  VITAL SIGNS: Temp:  [99.5 F (37.5 C)-100.8 F (38.2 C)] 99.7 F (37.6 C) (06/23 1000) Pulse Rate:  [78-107] 102 (06/23 1000) Resp:  [16-31] 25 (06/23 1000) BP: (148-182)/(69-118) 169/96 mmHg (06/23 1000) SpO2:  [96 %-100 %] 100 % (06/23 1000) FiO2 (%):  [30 %] 30 % (06/23 1000) Weight:  [76.204 kg (168 lb)]  76.204 kg (168 lb) (06/23 0453) VENTILATOR SETTINGS: Vent Mode:  [-] PSV;CPAP FiO2 (%):  [30 %] 30 % Set Rate:  [14 bmp-26 bmp] 14 bmp Vt Set:  [600 mL] 600 mL PEEP:  [5 cmH20] 5 cmH20 Pressure Support:  [8 cmH20-10 cmH20] 10 cmH20 Plateau Pressure:  [17 cmH20-20 cmH20] 19 cmH20 INTAKE / OUTPUT: Intake/Output     06/22 0701 - 06/23 0700 06/23 0701 - 06/24 0700   I.V. (mL/kg) 2341.7 (30.7) 475 (6.2)   NG/GT 1155    IV Piggyback 50    Total Intake(mL/kg) 3546.7 (46.5) 475 (6.2)   Urine (mL/kg/hr) 4870 (2.7) 825 (3.4)   Total Output 4870 825   Net -1323.3 -350          PHYSICAL EXAMINATION: General: No distress Neuro:  Unresponsive, moves head but not to command HEENT: ETT in place  Cardiovascular:  regular Lungs: no wheeze Abdomen: soft, non tender, good bowel sounds  Musculoskeletal: no edema Skin:  Intact   LABS:  Recent Labs Lab 06/30/12 0540 07/01/12 0400 07/02/12 0500  NA 154* 156* 155*  K 3.1* 3.5 3.8  CL 116* 121* 121*  CO2 28 26 26   BUN 48* 48* 45*  CREATININE 1.61* 1.48* 1.63*  GLUCOSE 244* 187* 236*   Recent Labs Lab 06/29/12 0500 07/01/12 0400 07/02/12 0500  HGB 13.1 12.6* 11.5*  HCT 42.7 41.3 39.3  WBC 16.3* 17.8* 19.8*  PLT 249 295 300   CBG (last 3)   Recent Labs  07/02/12 0005 07/02/12 0423 07/02/12 0807  GLUCAP  221* 188* 240*   Imaging: Dg Chest Port 1 View  07/01/2012   *RADIOLOGY REPORT*  Clinical Data: Respiratory failure.  PORTABLE CHEST - 1 VIEW  Comparison: 06/27/2012  Findings: Endotracheal tube approximately 3.5 cm above the carina. Central line positioning is stable.  Lungs show improved aeration bilaterally with no significant airspace consolidation or edema identified.  There is stable cardiomegaly.  No pleural fluid identified.  IMPRESSION: Improved aeration bilaterally.   Original Report Authenticated By: Irish Lack, M.D.   ASSESSMENT / PLAN:  NEUROLOGIC A:   CVA c/b ICH s/p TPA-->ICH, evolving SAH Cerebral  edema >> off 3% NS Interval development of IVH 6/15, [facility] 6/17 - EEG w no evidence seizure activity P:   - Serial neuro checks noted, no command following. - Dilantin for seizure prevention. - Change to intermittent sedation.  PULMONARY A: Acute respiratory failure in setting of acute encephalopathy d/t ICH Difficult airway: required glide scope and bougie/ cords swollen required #7 P:   - Pressure support wean as tolerated >> mental status precludes extubation. - Will speak with family on Wednesday and likely trach/peg on Thursday if they wish for full support, see discussion below.  CARDIOVASCULAR A:  HTN crisis NSTEMI -peak Tp 11 P:  - SBP goal 140-160 in setting of ICH, nicardipine restarted 6/17 pm - Continue statin. - BB for NSTEMI.  RENAL A:   Acute renal failure >> improving Hypokalemia Hypernatremia  P:   - Free water as ordered. - Monitor BMET. - Avoid nephrotoxins. - Holding diuretics. - Fluid even.  GASTROINTESTINAL A:  Nutrition. Ileus >> improved. P:   - Continue tube feeds  - Will arrange for PEG once trached if family wishes for full support. - Continue reglan for now.  HEMATOLOGIC A:  Coagulopathy due to tPA, resolved  P:  - Trend CBC. - SCDs.  INFECTIOUS A: R basilar PNA > MSSA P:   - D6/x Abx, current on ancef.  ENDOCRINE A:  Minimal hyperglycemia  P:   - Monitor CBG - SSI if needed  Summary: Prognosis for neurologic recovery is very poor.  Family wishes to continue aggressive care.  Son bedside is not hesitant about trach and would like a trial extubation, told that this could not be done given neuro status, family meeting arranged for Wednesday morning for discussion of plan of care, son will arrange.  CC time 35 min.  Alyson Reedy, M.D. [facility] Pulmonary/Critical Care Medicine. Pager: [phone]. After hours pager: [phone].

## 2012-07-02 NOTE — Progress Notes (Signed)
Stroke Team Progress Note  HISTORY [name] is an 57 y.o. 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 No focal weakness was noted. Patient has been taking aspirin 81 mg per day. CT scan showed no acute intracranial abnormality. NIH stroke score was 8. Patient had marked expressive aphasia as well as gaze preference to the left side and right visual field defect to visual confrontation. He was deemed a candidate for intravenous thrombolytic therapy with TPA.  LSN: [id number] on 06/21/2012  tPA Given: Yes  MRankin: 2  SUBJECTIVE Sister at bedside. She wants to know how he is doing. Dr. Molli Knock has asked for a family conference on Wed to again further discuss trach and PEG along with long term plan of care.   OBJECTIVE Most recent Vital Signs: Filed Vitals:   07/02/12 0800 07/02/12 0900 07/02/12 0931 07/02/12 1000  BP: 160/77 161/88 182/85 169/96  Pulse: 90 98  102  Temp: 99.5 F (37.5 C) 99.7 F (37.6 C)  99.7 F (37.6 C)  TempSrc: Core (Comment) Core (Comment)  Core (Comment)  Resp: 22 23  25   Height:      Weight:      SpO2: 100% 100%  100%   CBG (last 3)   Recent Labs  07/02/12 0005 07/02/12 0423 07/02/12 0807  GLUCAP 221* 188* 240*    IV Fluid Intake:   . sodium chloride 20 mL/hr at 07/02/12 1000  . feeding supplement (JEVITY 1.2 CAL) 1,000 mL (07/01/12 1329)  . niCARDipine 17 mg/hr (07/02/12 1048)    MEDICATIONS  . amLODipine  5 mg Per Tube Daily  . antiseptic oral rinse  15 mL Mouth Rinse QID  . atorvastatin  20 mg Oral q1800  .  ceFAZolin (ANCEF) IV  2 g Intravenous Q8H  . chlorhexidine  15 mL Mouth Rinse BID  . feeding supplement  60 mL Per Tube BID  . free  water  200 mL Per Tube Q8H  . insulin aspart  0-9 Units Subcutaneous Q4H  . metoCLOPramide (REGLAN) injection  10 mg Intravenous Q8H  . metoprolol tartrate  25 mg Oral Q6H  . pantoprazole sodium  40 mg Per Tube Q24H  . phenytoin  100 mg Per Tube TID  . potassium chloride  40 mEq Oral BID  . sodium chloride  10-40 mL Intracatheter Q12H   PRN:  acetaminophen, acetaminophen, artificial tears, fentaNYL, labetalol, LORazepam, metoprolol, midazolam, senna-docusate, sodium chloride  Diet:  NPO tube feeds  Activity:  Bedrest DVT Prophylaxis:  SCD  CLINICALLY SIGNIFICANT STUDIES Basic Metabolic Panel:   Recent Labs Lab 06/27/12 0301  07/01/12 0400 07/02/12 0500  NA 153*  < > 156* 155*  K 3.6  < > 3.5 3.8  CL 113*  < > 121* 121*  CO2 32  < > 26 26  GLUCOSE 160*  < > 187* 236*  BUN 46*  < > 48* 45*  CREATININE 1.46*  < > 1.48* 1.63*  CALCIUM 8.5  < > 8.4 8.4  MG 2.3  --   --   --   PHOS 4.4  --   --   --   < > = values in this interval not displayed. Liver Function Tests:  No results found for this basename: AST, ALT,  ALKPHOS, BILITOT, PROT, ALBUMIN,  in the last 168 hours CBC:   Recent Labs Lab 06/29/12 0500 07/01/12 0400 07/02/12 0500  WBC 16.3* 17.8* 19.8*  NEUTROABS 13.7*  --   --   HGB 13.1 12.6* 11.5*  HCT 42.7 41.3 39.3  MCV 64.0* 65.0* 65.4*  PLT 249 295 300   Coagulation:  No results found for this basename: LABPROT, INR,  in the last 168 hours Cardiac Enzymes:  No results found for this basename: CKTOTAL, CKMB, CKMBINDEX, TROPONINI,  in the last 168 hours Urinalysis: No results found for this basename: COLORURINE, APPERANCEUR, LABSPEC, PHURINE, GLUCOSEU, HGBUR, BILIRUBINUR, KETONESUR, PROTEINUR, UROBILINOGEN, NITRITE, LEUKOCYTESUR,  in the last 168 hours Lipid Panel    Component Value Date/Time   CHOL 185 06/22/2012 0525   TRIG 294* 06/28/2012 0600   HDL 58 06/22/2012 0525   CHOLHDL 3.2 06/22/2012 0525   VLDL 18 06/22/2012 0525   LDLCALC 109* 06/22/2012 0525    HgbA1C  Lab Results  Component Value Date   HGBA1C 6.2* 06/24/2012   Urine Drug Screen:   No results found for this basename: labopia,  cocainscrnur,  labbenz,  amphetmu,  thcu,  labbarb    Alcohol Level: No results found for this basename: ETH,  in the last 168 hours  Ct Head Wo Contrast 06/26/2012    Overall slight improvement in the degree of midline shift compared with priors.  No new areas of infarction.  No convincing signs for trapped right lateral ventricle on today's exam.  Stable multifocal predominantly left MCA territory infarcts with hemorrhagic transformation; no new infarcts or new lobar hemorrhage from priors.    06/24/2012    Large left parietal - frontal - posterior left temporal lobe hemorrhagic infarct and mass effect upon the left lateral ventricle relatively similar to the recent exam.  Interval development of interventricular blood within the tract right lateral ventricle.  Subarachnoid blood most notable right convexity and right sylvian fissure more apparent than on the prior exam.  Bilateral anterior frontal lobe infarcts greater on the left with minimal associated petechial hemorrhage.   06/23/2012   Minimal change in the large left hemorrhagic infarct.  Minimal change in the midline shift.    06/22/2012    Image quality degraded by significant motion.  There is a large area of hemorrhage in the left parietal lobe with mass effect and 10 mm midline shift.  This is most consistent with hemorrhagic infarction following TPA.    06/22/2012   Ct Portable Head W/o Cm Large area of hemorrhagic infarction left parietal lobe is similar. There is improvement in blood in the corpus collosum/ caudate on the left.  8 mm midline shift to the right without hydrocephalus.   06/21/2012    Chronic ischemic changes, right greater than left.  No acute abnormality.    MRI of the brain    MRA of the brain    2D Echocardiogram  ejection fraction was in the range of 60% to 65%. Possible mild  hypokinesis of the apical myocardium. - Aortic valve: Trivial regurgitation. - Mitral valve: Moderate regurgitation. - Left atrium: The atrium was mildly dilated. - Right ventricle: There was moderate hypertrophy. - Right atrium: The atrium was mildly dilated. - Pulmonary arteries: Systolic pressure was mildly  Carotid Doppler  Technically limited due to right IJ line and patient immobility. No evidence of significant ICA stenosis noted. Antegrade vertebral flow.  CXR   07/01/2012   Improved aeration bilaterally.    06/27/2012  Improved bibasilar aeration.  06/26/2012   1.  Low inspiratory volumes with new patchy right basilar opacity. Differential considerations include developing infiltrate, asymmetric pulmonary edema, and potentially atelectasis although this is less likely given the imaging appearance. 2.  Stable left retrocardiac opacity may reflect atelectasis or infiltrate. 3.  Pulmonary vascular congestion bordering on mild interstitial edema.    06/24/2012   Stable cardiomegaly.  No acute findings.    06/23/2012  1.  Nasogastric tube placement least as far as the stomach. 2.  Interval increase in bilateral edema or infiltrates.   06/22/2012  Tip of right jugular line projects over SVC without pneumothorax. Bibasilar atelectasis.   Original Report Authenticated By: Ulyses Southward, M.D.  06/21/2012   Moderate cardiomegaly.  No active disease.   EKG  normal sinus rhythm.   EEG no epileptiform activity  Therapy Recommendations     Physical Exam  General: The patient is intubated.  Neurologic Exam  Mental Status:  Awake, eyes open. No verbal. Not following commands.  Cranial nerves: Facial symmetry is present. The patient has a gaze preference to the left.  The eyes cannot be dolled across midline. Pupils are RIGHT 6-->4, LEFT 5--> 3.   Motor: INCREASED TONE IN LUE WITH CLONUS. INCREASED TONE IN LLE. SPONT MOVEMENT IN LUE.  WITHDRAWAL IN LLE. RUE AND RLE 0/5.   Reflexes: SLIGHTLY  BRISK IN LUE AND LLE.    ASSESSMENT [name] is a 57 y.o. male presenting with expressive aphasia with unintelligible speech output. He is status post IV t-PA 06/21/2012 at 20:17. Symptoms were consistent with left middle cerebral artery acute stroke.  Imaging confirmed a large left parietal - frontal - posterior left temporal lobe ischemic infarct.  Followup CT repeated due to acute delirium, tonic-clonic seizure activity in left arm, decreasing consciousness. That scan 06/22/2012 shows that there is a large 6 cm  area of hemorrhagic transformation in the left parietal lobe with cerebral edema - mass effect upon the left lateral ventricle and 10 mm midline shift.  Patient was subsequently intubated for airway protection. 3% saline started to decrease cytotoxic edema. F/u CT shows interval development of interventricular blood within the tract right lateral ventricle and subarachnoid blood most notable right convexity and right sylvian fissure  Initial iInfarct felt to be embolic, source unknown. On no antithrombotics prior to admission. Now on no antithrombitics due to hemorrhage.  Patient with resultant VDRF, global aphasia, right hemiparesis, dysphagia.   Hypernatremia, induced with 3% Saline to decrease cerebral edema, currently on hold,  sodium remains elevated at 155  Hyperlipidemia, LDL 109, not on statin prior to admission, on atorvastatin now  Chronic kidney disease, stage 3  Malignant Hypertension, on admission, remains on cardene drip that was restarted 06/26/2012 with SBP goal < 160  Possible STEMI with Troponin I normal on admission, elevation to 11.15 [facility], [facility] consulted, not a candidate for cardiac intervention until stabilizes. On lopressor 100 daily  Seizure-like activity with hemorrhage, EEG without epileptiform activity. On dilatnin  Right basilar pneumonia, MSSA, treated with ancef  Hospital day # 11  Pt has poor prognosis for meaningful  recovery.  TREATMENT/PLAN  Increase SBP goal to 180, wean cardene, add po BP medications (increased  norvasc)  Consider further workup to determine stroke etiology - hypercoags, TEE,   Continue Tube feeds  Needs trach, PEG. ? LTACH candidate. Further discussion regarding trach/peg Wednesday    Annie Main, MSN, RN, ANVP-BC, ANP-BC, GNP-BC [facility] Pager: [phone]  07/02/2012 11:08 AM  I have personally obtained a history, examined the patient, evaluated imaging results, and formulated the assessment and plan of care. I agree with the above.  Suanne Marker, MD 07/02/2012, 6:23 PM Certified in Neurology, Neurophysiology and Neuroimaging [facility] - Stroke Team  Please refer to [email or web address] for on-call Stroke MD

## 2012-07-02 NOTE — Progress Notes (Signed)
ANTIBIOTIC CONSULT NOTE - FOLLOW UP  Pharmacy Consult for ancef Indication: PNA  No Known Allergies  Patient Measurements: Height: 5\' 11"  (180.3 cm) Weight: 168 lb (76.204 kg) IBW/kg (Calculated) : 75.3  Vital Signs: Temp: 99.7 F (37.6 C) (06/23 1000) Temp src: Core (Comment) (06/23 1000) BP: 169/96 mmHg (06/23 1000) Pulse Rate: 102 (06/23 1000) Intake/Output from previous day: 06/22 0701 - 06/23 0700 In: 3546.7 [I.V.:2341.7; NG/GT:1155; IV Piggyback:50] Out: 4870 [Urine:4870] Intake/Output from this shift: Total I/O In: 475 [I.V.:475] Out: 825 [Urine:825]  Labs:  Recent Labs  06/30/12 0540 07/01/12 0400 07/02/12 0500  WBC  --  17.8* 19.8*  HGB  --  12.6* 11.5*  PLT  --  295 300  CREATININE 1.61* 1.48* 1.63*   Estimated Creatinine Clearance: 53.3 ml/min (by C-G formula based on Cr of 1.63). No results found for this basename: VANCOTROUGH, VANCOPEAK, VANCORANDOM, GENTTROUGH, GENTPEAK, GENTRANDOM, TOBRATROUGH, TOBRAPEAK, TOBRARND, AMIKACINPEAK, AMIKACINTROU, AMIKACIN,  in the last 72 hours     Assessment: 57 yo male with MSSA PNA on ancef (day 7 of antibiotics).  WBC= 19.8, tmax= 100.8 (currently afebrile), SCr= 1.63, CrCl ~50.  Vanc 6/17>>6/18 Cefazolin 6/18>>  6/15 TA - abundant SA - pan-S 6/12 MRSA - NEG   Plan:  -No ancef dose changes needed -Consider defining length of treatment and adding stop date (consider total of 7-10 days)  Harland German, Pharm D 07/02/2012 10:06 AM

## 2012-07-03 ENCOUNTER — Inpatient Hospital Stay (HOSPITAL_COMMUNITY): Payer: Medicare Other

## 2012-07-03 ENCOUNTER — Encounter (HOSPITAL_COMMUNITY): Payer: Self-pay | Admitting: Radiology

## 2012-07-03 DIAGNOSIS — I6350 Cerebral infarction due to unspecified occlusion or stenosis of unspecified cerebral artery: Secondary | ICD-10-CM

## 2012-07-03 DIAGNOSIS — J15211 Pneumonia due to Methicillin susceptible Staphylococcus aureus: Secondary | ICD-10-CM | POA: Diagnosis not present

## 2012-07-03 HISTORY — DX: Pneumonia due to methicillin susceptible Staphylococcus aureus: J15.211

## 2012-07-03 LAB — BLOOD GAS, ARTERIAL
Bicarbonate: 24.9 mEq/L — ABNORMAL HIGH (ref 20.0–24.0)
MECHVT: 600 mL
O2 Saturation: 98.6 %
PEEP: 5 cmH2O
Patient temperature: 98.6
pH, Arterial: 7.477 — ABNORMAL HIGH (ref 7.350–7.450)

## 2012-07-03 LAB — GLUCOSE, CAPILLARY
Glucose-Capillary: 182 mg/dL — ABNORMAL HIGH (ref 70–99)
Glucose-Capillary: 210 mg/dL — ABNORMAL HIGH (ref 70–99)

## 2012-07-03 LAB — CBC
HCT: 39.3 % (ref 39.0–52.0)
MCH: 19.7 pg — ABNORMAL LOW (ref 26.0–34.0)
MCV: 65.2 fL — ABNORMAL LOW (ref 78.0–100.0)
RBC: 6.03 MIL/uL — ABNORMAL HIGH (ref 4.22–5.81)
WBC: 18.6 10*3/uL — ABNORMAL HIGH (ref 4.0–10.5)

## 2012-07-03 LAB — BASIC METABOLIC PANEL
BUN: 48 mg/dL — ABNORMAL HIGH (ref 6–23)
CO2: 27 mEq/L (ref 19–32)
Chloride: 120 mEq/L — ABNORMAL HIGH (ref 96–112)
Creatinine, Ser: 1.88 mg/dL — ABNORMAL HIGH (ref 0.50–1.35)

## 2012-07-03 MED ORDER — FREE WATER
200.0000 mL | Freq: Four times a day (QID) | Status: DC
  Administered 2012-07-03 – 2012-07-05 (×8): 200 mL

## 2012-07-03 MED ORDER — INSULIN ASPART 100 UNIT/ML ~~LOC~~ SOLN
3.0000 [IU] | SUBCUTANEOUS | Status: DC
  Administered 2012-07-03 – 2012-07-06 (×19): 3 [IU] via SUBCUTANEOUS

## 2012-07-03 NOTE — Progress Notes (Signed)
Son and Sister approached me with questions regarding his code status and why it was DNR. They claimed that while they were in the family meeting which took place last week that they did not agree to DNR. They spoke with doctor and wife and displayed anger towards wife for having the DNR in place. So DNR was removed off the patient chart due to the son and sister stating they wanted everything to be done. The wife was respecting their wishes. Spoke with the wife one on one and she stated that she knows he would not want to live like this or in a nursing home with 24 hour care. They have been married 82yrs, together for 51yrs and do not have children together. The wife would like to abide by the patient wishes but she states that due to the family's culture they would blame her for him dying if she chose to withdrawal care or not proceed with trach and peg. Wanted this information to be present before the meeting with family tomorrow.

## 2012-07-03 NOTE — Care Management Note (Signed)
    Page 1 of 2   07/17/2012     3:48:38 PM   CARE MANAGEMENT NOTE 07/17/2012  Patient:  [name], [name]   Account Number:  [id number]  Date Initiated:  06/25/2012  Documentation initiated by:  Carlyle Lipa  Subjective/Objective Assessment:   sudden onset neuro symptoms; tPA given upon arrival; pt intubated for airway protection--remains on vent on hospital day 4     Action/Plan:   await medical stability to determine pt's needs for d/c, CIR vs SNF   Anticipated DC Date:  07/02/2012   Anticipated DC Plan:  SKILLED NURSING FACILITY      DC Planning Services  CM consult      Choice offered to / List presented to:  C-3 Spouse           Status of service:  Completed, signed off Medicare Important Message given?   (If response is "NO", the following Medicare IM given date fields will be blank) Date Medicare IM given:   Date Additional Medicare IM given:    Discharge Disposition:  LONG TERM ACUTE CARE (LTAC)  Per UR Regulation:  Reviewed for med. necessity/level of care/duration of stay  If discussed at Long Length of Stay Meetings, dates discussed:   06/28/2012  07/12/2012  07/17/2012    Comments:  07/17/12 Elmer Bales RN, MSN CM-  Pt being discharged today to [facility] room 327 under the care of Dr Angelina Ok.  Pt's RN was given the information to call report to [phone] ext. [phone].   Discharge summary was faxed to [phone].   07/12/12 0730  Elmer Bales RN, MSN CM- Met with patient's wife regarding possibility of LTACH.  Pt's wife states that she is not interested in either facility and wants to know what other options are.  This information was shared with Dr Pearlean Brownie as well as Dr Rito Ehrlich.  Dr Rito Ehrlich states he will discuss options with patient's wife.   07/11/12 1500 Elmer Bales RN, MSN CM-  Spoke with Boneta Lucks from [employer], who states that patient is an LTACH candidate.  CM spoke with patient's wife via phone as well as sister-in-law who was in the room at the  time.  Pt's wife, who is primary decision maker, felt unable to make the decision at this time.  CM will meet with patient's wife first thing in the morning to have the conversation face-to-face.  Boneta Lucks with [employer] was notified of meeting, but will be unable to attend at that time. CM will offer a visit or phone call from a [employer] representative to patient's wife.   07/11/12 1030  Courtney Robarge RN, MSN CM- Spoke with CSW regarding discharge plans. CSW met with family who has expressed interested in SNF placement with a PEG.  Pt's RN was notified that decision needs to be communicated with the physician.  Family is currently touring facilities to make SNF choice.    07-03-12 Patient discussed in LOS meeting this am . Suggested LTAC referral , Spoke with Annie Main : once patient receives trach he will be off vent and not appropriate for LTAC . LTAC representatives from [employer] both feel patient is appropriate , if attending wants to consider  in future .  Ronny Flurry RN BSN   06/28/2012 Carlyle Lipa, RN BSN [facility] CCM [id number]--Pt remains on vent. Wife unable to make decision about continued care.

## 2012-07-03 NOTE — Progress Notes (Signed)
Stroke Team Progress Note  HISTORY [name] is an 57 y.o. 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 No focal weakness was noted. Patient has been taking aspirin 81 mg per day. CT scan showed no acute intracranial abnormality. NIH stroke score was 8. Patient had marked expressive aphasia as well as gaze preference to the left side and right visual field defect to visual confrontation. He was deemed a candidate for intravenous thrombolytic therapy with TPA.  LSN: [id number] on 06/21/2012  tPA Given: Yes  MRankin: 2  SUBJECTIVE No family at bedside. Family conference planned for tomorrow to discuss trach and PEG. Dr. Craige Cotta feels pt will be easily weaned, he just needs an airway.  OBJECTIVE Most recent Vital Signs: Filed Vitals:   07/03/12 0630 07/03/12 0700 07/03/12 0743 07/03/12 0800  BP: 177/95 176/107 127/75 178/88  Pulse: 78 104 82 84  Temp: 99.5 F (37.5 C) 99.9 F (37.7 C)  100 F (37.8 C)  TempSrc:      Resp: 17 20 21 23   Height:      Weight:      SpO2: 100% 100% 100% 100%   CBG (last 3)   Recent Labs  07/03/12 0347 07/03/12 0349 07/03/12 0812  GLUCAP 148* 182* 210*    IV Fluid Intake:   . sodium chloride 20 mL/hr at 07/03/12 0800  . feeding supplement (JEVITY 1.2 CAL) 1,000 mL (07/02/12 1335)  . niCARDipine 8 mg/hr (07/03/12 0800)    MEDICATIONS  . amLODipine  10 mg Per Tube Daily  . antiseptic oral rinse  15 mL Mouth Rinse QID  . atorvastatin  20 mg Oral q1800  .  ceFAZolin (ANCEF) IV  2 g Intravenous Q8H  . chlorhexidine  15 mL Mouth Rinse BID  . feeding supplement  60 mL Per Tube BID  . free water  200 mL Per Tube Q8H  . insulin aspart  0-9 Units  Subcutaneous Q4H  . metoCLOPramide (REGLAN) injection  10 mg Intravenous Q8H  . metoprolol tartrate  25 mg Oral Q6H  . pantoprazole sodium  40 mg Per Tube Q24H  . phenytoin  100 mg Per Tube TID  . potassium chloride  40 mEq Oral BID  . sodium chloride  10-40 mL Intracatheter Q12H   PRN:  acetaminophen, acetaminophen, artificial tears, fentaNYL, labetalol, LORazepam, metoprolol, midazolam, senna-docusate  Diet:  NPO tube feeds  Activity:  Bedrest DVT Prophylaxis:  SCD  CLINICALLY SIGNIFICANT STUDIES Basic Metabolic Panel:   Recent Labs Lab 06/27/12 0301  07/02/12 0500 07/03/12 0500  NA 153*  < > 155* 158*  K 3.6  < > 3.8 3.9  CL 113*  < > 121* 120*  CO2 32  < > 26 27  GLUCOSE 160*  < > 236* 220*  BUN 46*  < > 45* 48*  CREATININE 1.46*  < > 1.63* 1.88*  CALCIUM 8.5  < > 8.4 8.8  MG 2.3  --   --  2.5  PHOS 4.4  --   --  3.8  < > = values in this interval not displayed. Liver Function Tests:  No results found for this basename: AST, ALT, ALKPHOS, BILITOT, PROT, ALBUMIN,  in the last 168 hours CBC:   Recent Labs Lab 06/29/12 0500  07/02/12 0500 07/03/12 0500  WBC 16.3*  < > 19.8* 18.6*  NEUTROABS 13.7*  --   --   --   HGB 13.1  < > 11.5* 11.9*  HCT 42.7  < > 39.3 39.3  MCV 64.0*  < > 65.4* 65.2*  PLT 249  < > 300 323  < > = values in this interval not displayed. Coagulation:  No results found for this basename: LABPROT, INR,  in the last 168 hours Cardiac Enzymes:  No results found for this basename: CKTOTAL, CKMB, CKMBINDEX, TROPONINI,  in the last 168 hours Urinalysis: No results found for this basename: COLORURINE, APPERANCEUR, LABSPEC, PHURINE, GLUCOSEU, HGBUR, BILIRUBINUR, KETONESUR, PROTEINUR, UROBILINOGEN, NITRITE, LEUKOCYTESUR,  in the last 168 hours Lipid Panel    Component Value Date/Time   CHOL 185 06/22/2012 0525   TRIG 294* 06/28/2012 0600   HDL 58 06/22/2012 0525   CHOLHDL 3.2 06/22/2012 0525   VLDL 18 06/22/2012 0525   LDLCALC 109* 06/22/2012 0525    HgbA1C  Lab Results  Component Value Date   HGBA1C 6.2* 06/24/2012   Urine Drug Screen:   No results found for this basename: labopia,  cocainscrnur,  labbenz,  amphetmu,  thcu,  labbarb    Alcohol Level: No results found for this basename: ETH,  in the last 168 hours  Ct Head Wo Contrast 07/03/2012 No significant change. Blood in the occipital horn of the right lateral ventricle is more apparent. Midline shift is essentially unchanged. No new hemorrhage on the left. 06/26/2012    Overall slight improvement in the degree of midline shift compared with priors.  No new areas of infarction.  No convincing signs for trapped right lateral ventricle on today's exam.  Stable multifocal predominantly left MCA territory infarcts with hemorrhagic transformation; no new infarcts or new lobar hemorrhage from priors.    06/24/2012    Large left parietal - frontal - posterior left temporal lobe hemorrhagic infarct and mass effect upon the left lateral ventricle relatively similar to the recent exam.  Interval development of interventricular blood within the tract right lateral ventricle.  Subarachnoid blood most notable right convexity and right sylvian fissure more apparent than on the prior exam.  Bilateral anterior frontal lobe infarcts greater on the left with minimal associated petechial hemorrhage.   06/23/2012   Minimal change in the large left hemorrhagic infarct.  Minimal change in the midline shift.    06/22/2012    Image quality degraded by significant motion.  There is a large area of hemorrhage in the left parietal lobe with mass effect and 10 mm midline shift.  This is most consistent with hemorrhagic infarction following TPA.    06/22/2012   Ct Portable Head W/o Cm Large area of hemorrhagic infarction left parietal lobe is similar. There is improvement in blood in the corpus collosum/ caudate on the left.  8 mm midline shift to the right without hydrocephalus.   06/21/2012    Chronic ischemic changes,  right greater than left.  No acute abnormality.    MRI of the brain    MRA of the brain    2D Echocardiogram  ejection fraction was in the range of 60% to 65%. Possible mild hypokinesis of the apical myocardium. - Aortic valve: Trivial regurgitation. - Mitral valve: Moderate regurgitation. - Left atrium: The atrium was mildly dilated. - Right ventricle: There was moderate hypertrophy. - Right atrium: The atrium was mildly dilated. - Pulmonary arteries: Systolic pressure was mildly  Carotid Doppler  Technically limited due to right IJ line and patient immobility. No evidence of significant ICA stenosis noted. Antegrade vertebral flow.  CXR   07/03/2012 Bibasilar atelectasis. 07/01/2012   Improved aeration bilaterally.    06/27/2012    Improved bibasilar aeration.  06/26/2012   1.  Low inspiratory volumes with new patchy right basilar opacity. Differential considerations include developing infiltrate, asymmetric pulmonary edema, and potentially atelectasis although this is less likely given the imaging appearance. 2.  Stable left retrocardiac opacity may reflect atelectasis or infiltrate. 3.  Pulmonary vascular congestion bordering on mild interstitial edema.    06/24/2012   Stable cardiomegaly.  No acute findings.    06/23/2012  1.  Nasogastric tube placement least as far as the stomach. 2.  Interval increase in bilateral edema or infiltrates.   06/22/2012  Tip of right jugular line projects over SVC without pneumothorax. Bibasilar atelectasis.   Original Report Authenticated By: Ulyses Southward, M.D.  06/21/2012   Moderate cardiomegaly.  No active disease.   EKG  normal sinus rhythm.   EEG no epileptiform activity  Therapy Recommendations    Physical Exam  General: The patient is intubated.  Neurologic Exam  Mental Status:  Eyes closed. Opens eyes to stim, no verbal. Not following commands.  Cranial nerves: Facial symmetry is present. The patient has a gaze preference to the left.  The  eyes cannot be dolled across midline. Pupils are RIGHT 6-->4, LEFT 5--> 3.   Motor: MILD SPONT MOVEMENT IN LUE.  WITHDRAWAL IN LLE. RUE AND RLE 0/5.   Reflexes: SLIGHTLY BRISK IN LUE AND LLE.    ASSESSMENT [name] is a 57 y.o. male presenting with expressive aphasia with unintelligible speech output. He is status post IV t-PA 06/21/2012 at 20:17. Symptoms were consistent with left middle cerebral artery acute stroke.  Imaging confirmed a large left parietal - frontal - posterior left temporal lobe ischemic infarct with old right frontal infarct.  Followup CT repeated due to acute delirium, tonic-clonic seizure activity in left arm, decreasing consciousness. That scan 06/22/2012 shows that there is a large 6 cm  area of hemorrhagic transformation in the left parietal lobe with cerebral edema - mass effect upon the left lateral ventricle and 10 mm midline shift.  Patient was subsequently intubated for airway protection. 3% saline started to decrease cytotoxic edema. F/u CT shows interval development of interventricular blood within the tract right lateral ventricle and subarachnoid blood most notable right convexity and right sylvian fissure  Initial infarcts felt to be embolic, source most likely cardioembolic from acute MI. On no antithrombotics prior to admission. Now on no antithrombitics due to hemorrhage.  Patient with resultant VDRF, global aphasia, right hemiparesis, dysphagia.   Hypernatremia, induced with 3% Saline to decrease cerebral edema, currently on hold,  sodium remains elevated at 158  Hyperlipidemia, LDL 109, not on statin prior to admission, on atorvastatin now  Chronic kidney disease, stage 3  Malignant Hypertension, on admission, remains on cardene drip that was restarted 06/26/2012 with SBP goal < 160  Possible NSTEMI with Troponin I normal on admission, elevation to 11.15 peak hospital D#2, [facility] consulted, not a candidate for cardiac intervention until  stabilizes.   Tachycardia, resolved. Placed On lopressor 100 daily.   Seizure-like activity with hemorrhage, EEG without epileptiform activity. On dilatnin  Right basilar pneumonia, MSSA, treated with ancef  Hospital day # 12  TREATMENT/PLAN  SBP goal now 180, weaning cardene, norvasc increased yesterday. Ask CCM to further address labile BP and  renal dysfunction  Increase free water to help lower sodium slowly  Continue Tube feeds  Needs trach, PEG.  Further discussion with family regarding trach/peg Wednesday   OOB. Therapy evals once extubated   Annie Main, MSN, RN, ANVP-BC, ANP-BC, GNP-BC [facility] Pager: [phone] 07/03/2012 9:04 AM  I have personally obtained a history, examined the patient, evaluated imaging results, and formulated the assessment and plan of care. I agree with the above.  Suanne Marker, MD 07/03/2012, 9:04 AM Certified in Neurology, Neurophysiology and Neuroimaging [facility] - Stroke Team  Please refer to [email or web address] for on-call Stroke MD

## 2012-07-03 NOTE — Progress Notes (Signed)
PULMONARY  / CRITICAL CARE MEDICINE  Name: [name] MRN: [id number] DOB: 14-Nov-1955    ADMISSION DATE:  06/21/2012 CONSULTATION DATE:  6/13  REFERRING MD :  sethi  PRIMARY SERVICE: stroke team   CHIEF COMPLAINT:  Asked to see for ventilator mx and supportive critical care services   BRIEF PATIENT DESCRIPTION:  This is 57 year old male admitted 6/12 w/ Left posterior MCA infarct. Got TPA. Found to have AMS the am of 6/13, CT showed new left ICH w/ midline shift. PCCM asked to see for airway support, IV access and supportive critical care.   SIGNIFICANT EVENTS / STUDIES:  CT head 6/12: Chronic ischemic changes, right greater than left. No acute abnormality CT head 6/13: Image quality degraded by significant motion. There is a large area of hemorrhage in the left parietal lobe with mass effect and 10 mm midline shift. This is most consistent with  hemorrhagic infarction following TPA. ECHO 6/14: EF 60-65%, mild apical hypokinesis CT head 6/15: Large left parietal - frontal - posterior left temporal lobe hemorrhagic infarct and mass effect upon the left lateral ventricle relatively similar to the recent exam. Interval development of interventricular blood within the tract right lateral ventricle. EEG 6/19: general slowing, no seizure activity that correlates w tremor CT head 6/24: No change in blood in occipital horn of Rt lateral ventricle with midline shift  LINES / TUBES: OETT 6/13 (DIFFICULT AIRWAY, Cords swollen, glide scope, #7, very deep)>>> Right IJ 6/13>>> Right radial A-line 6/13 >>> out  CULTURES: Sputum 6/15>>> s aureus >> MSSA  ANTIBIOTICS: vanco 6/17 (s aureus sputum) >> 6/18 Ancef 6/18 (MSSA) >>   SUBJECTIVE:  Tolerates pressure support.  VITAL SIGNS: Temp:  [99.5 F (37.5 C)-100.8 F (38.2 C)] 99.9 F (37.7 C) (06/24 0700) Pulse Rate:  [74-119] 82 (06/24 0743) Resp:  [10-30] 21 (06/24 0743) BP: (127-190)/(67-107) 127/75 mmHg (06/24 0743) SpO2:  [99 %-100  %] 100 % (06/24 0743) FiO2 (%):  [30 %-40 %] 40 % (06/24 0743) Weight:  [162 lb 4.1 oz (73.6 kg)] 162 lb 4.1 oz (73.6 kg) (06/24 0500) VENTILATOR SETTINGS: Vent Mode:  [-] PSV;CPAP FiO2 (%):  [30 %-40 %] 40 % Set Rate:  [14 bmp] 14 bmp Vt Set:  [600 mL] 600 mL PEEP:  [5 cmH20] 5 cmH20 Pressure Support:  [5 cmH20-10 cmH20] 5 cmH20 Plateau Pressure:  [18 cmH20-19 cmH20] 18 cmH20 INTAKE / OUTPUT: Intake/Output     06/23 0701 - 06/24 0700 06/24 0701 - 06/25 0700   I.V. (mL/kg) 1836.8 (25)    NG/GT 1260    IV Piggyback 100    Total Intake(mL/kg) 3196.8 (43.4)    Urine (mL/kg/hr) 4805 (2.7)    Total Output 4805     Net -1608.2            PHYSICAL EXAMINATION: General: No distress Neuro:  Open's eyes spontaneously, not tracking, not following commands HEENT: ETT in place  Cardiovascular:  regular Lungs: no wheeze Abdomen: soft, non tender, good bowel sounds  Musculoskeletal: no edema Skin:  Intact   LABS:  Recent Labs Lab 07/01/12 0400 07/02/12 0500 07/03/12 0500  NA 156* 155* 158*  K 3.5 3.8 3.9  CL 121* 121* 120*  CO2 26 26 27   BUN 48* 45* 48*  CREATININE 1.48* 1.63* 1.88*  GLUCOSE 187* 236* 220*    Recent Labs Lab 07/01/12 0400 07/02/12 0500 07/03/12 0500  HGB 12.6* 11.5* 11.9*  HCT 41.3 39.3 39.3  WBC 17.8* 19.8* 18.6*  PLT 295 300 323   CBG (last 3)   Recent Labs  07/02/12 2347 07/03/12 0347 07/03/12 0349  GLUCAP 333* 148* 182*   Imaging: Ct Head Wo Contrast  07/03/2012   *RADIOLOGY REPORT*  Clinical Data: Left middle cerebral artery infarction.  CT HEAD WITHOUT CONTRAST  Technique:  Contiguous axial images were obtained from the base of the skull through the vertex without contrast.  Comparison: None.  Findings: Right subarachnoid hemorrhage is stable.  Small amount in the occipital horn of the right lateral ventricle.  Midline shift is 7 mm.  Left MCA hemorrhagic infarct demonstrates aging but there is no new area of infarct in this region.   Scattered periventricular white matter lucency in the right hemisphere consistent with old lacunar infarcts, unchanged.  No osseous abnormality.  IMPRESSION: No significant change.  Blood in the occipital horn of the right lateral ventricle is more apparent.  Midline shift is essentially unchanged.  No new hemorrhage on the left.   Original Report Authenticated By: Francene Boyers, M.D.   Dg Chest Port 1 View  07/03/2012   *RADIOLOGY REPORT*  Clinical Data: Ventilator.  PORTABLE CHEST - 1 VIEW  Comparison: 07/01/2012  Findings: Support devices are stable.  Bibasilar atelectasis. Heart is borderline in size.  No effusions.  No acute bony abnormality.  IMPRESSION: Bibasilar atelectasis.   Original Report Authenticated By: Charlett Nose, M.D.   ASSESSMENT / PLAN:  NEUROLOGIC A:   CVA c/b ICH s/p TPA-->ICH 6/15, [facility] 6/17 Cerebral edema >> off 3% NS P:   - Dilantin for seizure prevention. - Intermittent sedation.  PULMONARY A: Acute respiratory failure in setting of acute encephalopathy d/t ICH Difficult airway: required glide scope and bougie/ cords swollen required #7 P:   - Pressure support wean as tolerated >> mental status precludes extubation. - Will speak with family on Wednesday and likely trach/peg on Thursday if they wish for full support - f/u CXR intermittently  CARDIOVASCULAR A:  HTN crisis NSTEMI -peak Tp 11 P:  - SBP goal < 180 - Continue lipitor, amlodipine, lopressor - no ASA due to ICH  RENAL A:   Acute renal failure >> improving Hypokalemia Hypernatremia  P:   - Free water as ordered. - Monitor BMET. - Avoid nephrotoxins. - Holding diuretics. - Fluid even.  GASTROINTESTINAL A:  Nutrition. Ileus >> improved. P:   - Continue tube feeds  - Will arrange for PEG once trached if family wishes for full support. - Continue reglan for now.  HEMATOLOGIC A:  Coagulopathy due to tPA, resolved  P:  - Trend CBC. - SCDs.  INFECTIOUS A: R basilar PNA > MSSA P:    - D7/10 Abx, current on ancef.  ENDOCRINE A:  Minimal hyperglycemia  P:   - Monitor CBG - SSI if needed  Summary: Prognosis for neuro recover poor.  Family leaning toward trach/peg, and long term support.  Family meeting scheduled for 6/25.  CC time 35 min.  Coralyn Helling, MD [facility] Pulmonary/Critical Care 07/03/2012, 8:25 AM Pager:  [phone] After 3pm call: [phone]

## 2012-07-03 NOTE — Progress Notes (Signed)
Mother and son presented to bedside, and disagreed with DNR status that was decided on last week. Patient's wife called, and she agrees with DNR status decision from before, but at family request, she wants to reverse the DNR status, making the patient a full code. DNR order canceled.  Suanne Marker, MD 07/03/2012, 9:42AM Certified in Neurology, Neurophysiology and Neuroimaging [facility] - Stroke Team  Please refer to [email or web address] for on-call Stroke MD

## 2012-07-04 LAB — GLUCOSE, CAPILLARY
Glucose-Capillary: 198 mg/dL — ABNORMAL HIGH (ref 70–99)
Glucose-Capillary: 203 mg/dL — ABNORMAL HIGH (ref 70–99)
Glucose-Capillary: 235 mg/dL — ABNORMAL HIGH (ref 70–99)
Glucose-Capillary: 244 mg/dL — ABNORMAL HIGH (ref 70–99)
Glucose-Capillary: 252 mg/dL — ABNORMAL HIGH (ref 70–99)

## 2012-07-04 LAB — BASIC METABOLIC PANEL
CO2: 28 mEq/L (ref 19–32)
Chloride: 118 mEq/L — ABNORMAL HIGH (ref 96–112)
GFR calc Af Amer: 46 mL/min — ABNORMAL LOW (ref >90)
Potassium: 4.3 mEq/L (ref 3.5–5.1)

## 2012-07-04 LAB — CBC
HCT: 38.5 % — ABNORMAL LOW (ref 39.0–52.0)
Hemoglobin: 11.7 g/dL — ABNORMAL LOW (ref 13.0–17.0)
MCV: 65.5 fL — ABNORMAL LOW (ref 78.0–100.0)
WBC: 17.3 10*3/uL — ABNORMAL HIGH (ref 4.0–10.5)

## 2012-07-04 MED ORDER — PRO-STAT SUGAR FREE PO LIQD
30.0000 mL | Freq: Three times a day (TID) | ORAL | Status: DC
  Administered 2012-07-04 – 2012-07-06 (×6): 30 mL
  Filled 2012-07-04 (×8): qty 30

## 2012-07-04 MED ORDER — CLONIDINE HCL 0.1 MG PO TABS
0.1000 mg | ORAL_TABLET | Freq: Two times a day (BID) | ORAL | Status: DC
  Administered 2012-07-04 – 2012-07-06 (×4): 0.1 mg via ORAL
  Filled 2012-07-04 (×6): qty 1

## 2012-07-04 NOTE — Progress Notes (Signed)
NUTRITION FOLLOW UP  Intervention:   1. Continue Jevity 1.2 at 60 ml/hr.  2. Decrease Prostat to 30 ml TID.   TF regimen provides 2018 kcal, 125 g protein, 1166 ml free water, meeting 98% estimated kcal needs, and 100% estimated protein needs.   Nutrition Dx:   Inadequate oral intake related to inability to eat as evidenced by NPO status; ongoing.   Goal:  Pt to meet >/= 90% of their estimated nutrition needs, met.  Monitor:   Vent status, TF tolerance, weight, labs  Assessment:   Patient with left CMA, received t-PA, CT on 6/13 showed new left ICH with midline shift. Patient remains intubated on ventilator support.  MV: 10.5 Temp: 38.6 Family meeting today to discuss goals of care. Likely trach/PEG placement Patient with OG tube in place. Jevity 1.2 infusing at 60 ml/hr with 60 ml Prostat BID, providing 2128 kcal, 140 g protein, and 1162 ml free water. Patient tolerating well.   200 ml free water flush every 6 hours. Total free water: 1962 ml  Height: Ht Readings from Last 1 Encounters:  06/22/12 5\' 11"  (1.803 m)    Weight Status:   Wt Readings from Last 1 Encounters:  07/04/12 158 lb 1.1 oz (71.7 kg)  Admit weight 171 pounds with 12 L negative fluid balance  Re-estimated needs:  Kcal: 2066 kcal Protein: 110-130 g Fluid: >2 L  Skin: Intact  Diet Order: NPO   Intake/Output Summary (Last 24 hours) at 07/04/12 1302 Last data filed at 07/04/12 1100  Gross per 24 hour  Intake 2970.5 ml  Output   3457 ml  Net -486.5 ml    Last BM: 5 over the last 24 hours  Labs:   Recent Labs Lab 07/02/12 0500 07/03/12 0500 07/04/12 0400  NA 155* 158* 156*  K 3.8 3.9 4.3  CL 121* 120* 118*  CO2 26 27 28   BUN 45* 48* 53*  CREATININE 1.63* 1.88* 1.81*  CALCIUM 8.4 8.8 8.8  MG  --  2.5  --   PHOS  --  3.8  --   GLUCOSE 236* 220* 265*    CBG (last 3)   Recent Labs  07/04/12 0359 07/04/12 0748 07/04/12 1144  GLUCAP 208* 203* 241*    Scheduled Meds: .  amLODipine  10 mg Per Tube Daily  . antiseptic oral rinse  15 mL Mouth Rinse QID  . atorvastatin  20 mg Oral q1800  .  ceFAZolin (ANCEF) IV  2 g Intravenous Q8H  . chlorhexidine  15 mL Mouth Rinse BID  . cloNIDine  0.1 mg Oral BID WC  . feeding supplement  60 mL Per Tube BID  . free water  200 mL Per Tube Q6H  . insulin aspart  0-9 Units Subcutaneous Q4H  . insulin aspart  3 Units Subcutaneous Q4H  . metoCLOPramide (REGLAN) injection  10 mg Intravenous Q8H  . metoprolol tartrate  25 mg Oral Q6H  . pantoprazole sodium  40 mg Per Tube Q24H  . phenytoin  100 mg Per Tube TID  . potassium chloride  40 mEq Oral BID  . sodium chloride  10-40 mL Intracatheter Q12H    Continuous Infusions: . sodium chloride 20 mL/hr at 07/04/12 1100  . feeding supplement (JEVITY 1.2 CAL) 1,000 mL (07/04/12 0618)  . niCARDipine Stopped (07/04/12 0915)    Linnell Fulling, RD, LDN Pager #: [phone] After-Hours Pager #: [phone]

## 2012-07-04 NOTE — Progress Notes (Signed)
PULMONARY  / CRITICAL CARE MEDICINE  Name: [name] MRN: [id number] DOB: 06-08-1955    ADMISSION DATE:  06/21/2012 CONSULTATION DATE:  6/13  REFERRING MD :  sethi  PRIMARY SERVICE: stroke team   CHIEF COMPLAINT:  Asked to see for ventilator mx and supportive critical care services   BRIEF PATIENT DESCRIPTION:  This is 57 year old male admitted 6/12 w/ Left posterior MCA infarct. Got TPA. Found to have AMS the am of 6/13, CT showed new left ICH w/ midline shift. PCCM asked to see for airway support, IV access and supportive critical care.   SIGNIFICANT EVENTS / STUDIES:  CT head 6/12: Chronic ischemic changes, right greater than left. No acute abnormality CT head 6/13: Image quality degraded by significant motion. There is a large area of hemorrhage in the left parietal lobe with mass effect and 10 mm midline shift. This is most consistent with  hemorrhagic infarction following TPA. ECHO 6/14: EF 60-65%, mild apical hypokinesis CT head 6/15: Large left parietal - frontal - posterior left temporal lobe hemorrhagic infarct and mass effect upon the left lateral ventricle relatively similar to the recent exam. Interval development of interventricular blood within the tract right lateral ventricle. EEG 6/19: general slowing, no seizure activity that correlates w tremor CT head 6/24: No change in blood in occipital horn of Rt lateral ventricle with midline shift  LINES / TUBES: OETT 6/13 (DIFFICULT AIRWAY, Cords swollen, glide scope, #7, very deep)>>> Right IJ 6/13>>> Right radial A-line 6/13 >>> out  CULTURES: Sputum 6/15>>> s aureus >> MSSA  ANTIBIOTICS: vanco 6/17 (s aureus sputum) >> 6/18 Ancef 6/18 (MSSA) >> 6/27  SUBJECTIVE:  Tolerates pressure support.  VITAL SIGNS: Temp:  [99.7 F (37.6 C)-101.5 F (38.6 C)] 99.7 F (37.6 C) (06/25 0800) Pulse Rate:  [69-108] 84 (06/25 0800) Resp:  [14-38] 21 (06/25 0800) BP: (122-201)/(73-116) 185/110 mmHg (06/25 0800) SpO2:  [100  %] 100 % (06/25 0800) FiO2 (%):  [40 %] 40 % (06/25 0800) Weight:  [71.7 kg (158 lb 1.1 oz)] 71.7 kg (158 lb 1.1 oz) (06/25 0400) VENTILATOR SETTINGS: Vent Mode:  [-] CPAP;PSV FiO2 (%):  [40 %] 40 % Set Rate:  [14 bmp] 14 bmp Vt Set:  [600 mL] 600 mL PEEP:  [5 cmH20] 5 cmH20 Pressure Support:  [8 cmH20] 8 cmH20 Plateau Pressure:  [18 cmH20-23 cmH20] 18 cmH20 INTAKE / OUTPUT: Intake/Output     06/24 0701 - 06/25 0700 06/25 0701 - 06/26 0700   I.V. (mL/kg) 1000.5 (14) 35 (0.5)   NG/GT 1970 60   IV Piggyback 150    Total Intake(mL/kg) 3120.5 (43.5) 95 (1.3)   Urine (mL/kg/hr) 4340 (2.5) 150 (0.8)   Total Output 4340 150   Net -1219.5 -55        Stool Occurrence 1 x      PHYSICAL EXAMINATION: General: No distress Neuro:  Open's eyes spontaneously, not tracking, not following commands HEENT: ETT in place  Cardiovascular:  regular Lungs: no wheeze Abdomen: soft, non tender, good bowel sounds  Musculoskeletal: no edema Skin:  Intact   LABS:  Recent Labs Lab 07/02/12 0500 07/03/12 0500 07/04/12 0400  NA 155* 158* 156*  K 3.8 3.9 4.3  CL 121* 120* 118*  CO2 26 27 28   BUN 45* 48* 53*  CREATININE 1.63* 1.88* 1.81*  GLUCOSE 236* 220* 265*    Recent Labs Lab 07/02/12 0500 07/03/12 0500 07/04/12 0400  HGB 11.5* 11.9* 11.7*  HCT 39.3 39.3 38.5*  WBC  19.8* 18.6* 17.3*  PLT 300 323 298   CBG (last 3)   Recent Labs  07/03/12 2335 07/04/12 0359 07/04/12 0748  GLUCAP 244* 208* 203*   Imaging: Ct Head Wo Contrast  07/03/2012   *RADIOLOGY REPORT*  Clinical Data: Left middle cerebral artery infarction.  CT HEAD WITHOUT CONTRAST  Technique:  Contiguous axial images were obtained from the base of the skull through the vertex without contrast.  Comparison: None.  Findings: Right subarachnoid hemorrhage is stable.  Small amount in the occipital horn of the right lateral ventricle.  Midline shift is 7 mm.  Left MCA hemorrhagic infarct demonstrates aging but there is no new  area of infarct in this region.  Scattered periventricular white matter lucency in the right hemisphere consistent with old lacunar infarcts, unchanged.  No osseous abnormality.  IMPRESSION: No significant change.  Blood in the occipital horn of the right lateral ventricle is more apparent.  Midline shift is essentially unchanged.  No new hemorrhage on the left.   Original Report Authenticated By: Francene Boyers, M.D.   Dg Chest Port 1 View  07/03/2012   *RADIOLOGY REPORT*  Clinical Data: Ventilator.  PORTABLE CHEST - 1 VIEW  Comparison: 07/01/2012  Findings: Support devices are stable.  Bibasilar atelectasis. Heart is borderline in size.  No effusions.  No acute bony abnormality.  IMPRESSION: Bibasilar atelectasis.   Original Report Authenticated By: Charlett Nose, M.D.   ASSESSMENT / PLAN:  NEUROLOGIC A:   CVA c/b ICH s/p TPA-->ICH 6/15, [facility] 6/17 Cerebral edema >> off 3% NS P:   - Dilantin for seizure prevention. - Intermittent sedation.  PULMONARY A: Acute respiratory failure in setting of acute encephalopathy d/t ICH Difficult airway: required glide scope and bougie/ cords swollen required #7 P:   - Pressure support wean as tolerated >> mental status precludes extubation. - Family meeting today at 10 AM and will likely proceed with trach/peg and placement, will not place orders until after meeting. - F/u CXR daily.  CARDIOVASCULAR A:  HTN crisis NSTEMI -peak Tp 11 P:  - SBP goal < 180 - Continue lipitor, amlodipine, lopressor - No ASA due to ICH.  RENAL A:   Acute renal failure >> improving Hypokalemia Hypernatremia  P:   - Free water as ordered. - Monitor BMET. - Avoid nephrotoxins. - Holding diuretics. - Fluid even.  GASTROINTESTINAL A:  Nutrition. Ileus >> improved. P:   - Continue tube feeds  - Will arrange for PEG once trached if family wishes for full support. - Continue reglan for now.  HEMATOLOGIC A:  Coagulopathy due to tPA, resolved  P:  - Trend  CBC. - SCDs.  INFECTIOUS A: R basilar PNA > MSSA P:   - D8/10 Abx, current on ancef.  ENDOCRINE A:  Minimal hyperglycemia  P:   - Monitor CBG - SSI if needed  Summary: Prognosis for neuro recover poor.  Family reversed code status to full code, will meet with them today for goals of care.  Will likely proceed with trach/peg and placement, discussed with neuro and are ok with plan.  CC time 35 min.  Alyson Reedy, M.D. [facility] Pulmonary/Critical Care Medicine. Pager: [phone]. After hours pager: [phone].

## 2012-07-04 NOTE — Progress Notes (Signed)
Nursing 1130 Patient coughing and setting off ventilator alarms, patient having tachypnea on assessment. RT notified to place patient back on full vent support.  Fentanyl IVP given as ordered for tachypnea and tachycardia.  Will continue to assess.

## 2012-07-04 NOTE — Progress Notes (Addendum)
Stroke Team Progress Note  HISTORY [name] is an 57 y.o. 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 No focal weakness was noted. Patient has been taking aspirin 81 mg per day. CT scan showed no acute intracranial abnormality. NIH stroke score was 8. Patient had marked expressive aphasia as well as gaze preference to the left side and right visual field defect to visual confrontation. He was deemed a candidate for intravenous thrombolytic therapy with TPA.  LSN: [id number] on 06/21/2012  tPA Given: Yes  MRankin: 2  SUBJECTIVE Patient remains intubated. No new events. No family at bedside.   OBJECTIVE Most recent Vital Signs: Filed Vitals:   07/04/12 0515 07/04/12 0530 07/04/12 0600 07/04/12 0700  BP: 176/94 175/91 166/90 157/96  Pulse: 81 77 69 78  Temp: 100.6 F (38.1 C) 100.2 F (37.9 C) 99.9 F (37.7 C) 99.9 F (37.7 C)  TempSrc:      Resp: 16 16 14 17   Height:      Weight:      SpO2: 100% 100% 100% 100%   CBG (last 3)   Recent Labs  07/03/12 1612 07/03/12 1942 07/04/12 0359  GLUCAP 238* 194* 208*    IV Fluid Intake:   . sodium chloride 20 mL/hr at 07/04/12 0700  . feeding supplement (JEVITY 1.2 CAL) 1,000 mL (07/04/12 0618)  . niCARDipine 3 mg/hr (07/04/12 0700)    MEDICATIONS  . amLODipine  10 mg Per Tube Daily  . antiseptic oral rinse  15 mL Mouth Rinse QID  . atorvastatin  20 mg Oral q1800  .  ceFAZolin (ANCEF) IV  2 g Intravenous Q8H  . chlorhexidine  15 mL Mouth Rinse BID  . feeding supplement  60 mL Per Tube BID  . free water  200 mL Per Tube Q6H  . insulin aspart  0-9 Units Subcutaneous Q4H  . insulin aspart  3 Units Subcutaneous Q4H  .  metoCLOPramide (REGLAN) injection  10 mg Intravenous Q8H  . metoprolol tartrate  25 mg Oral Q6H  . pantoprazole sodium  40 mg Per Tube Q24H  . phenytoin  100 mg Per Tube TID  . potassium chloride  40 mEq Oral BID  . sodium chloride  10-40 mL Intracatheter Q12H   PRN:  acetaminophen, acetaminophen, artificial tears, fentaNYL, labetalol, LORazepam, metoprolol, midazolam, senna-docusate  Diet:  NPO tube feeds  Activity:  Bedrest DVT Prophylaxis:  SCD  CLINICALLY SIGNIFICANT STUDIES Basic Metabolic Panel:   Recent Labs Lab 07/03/12 0500 07/04/12 0400  NA 158* 156*  K 3.9 4.3  CL 120* 118*  CO2 27 28  GLUCOSE 220* 265*  BUN 48* 53*  CREATININE 1.88* 1.81*  CALCIUM 8.8 8.8  MG 2.5  --   PHOS 3.8  --    Liver Function Tests:  No results found for this basename: AST, ALT, ALKPHOS, BILITOT, PROT, ALBUMIN,  in the last 168 hours CBC:   Recent Labs Lab 06/29/12 0500  07/03/12 0500 07/04/12 0400  WBC 16.3*  < > 18.6* 17.3*  NEUTROABS 13.7*  --   --   --   HGB 13.1  < > 11.9* 11.7*  HCT 42.7  < > 39.3 38.5*  MCV 64.0*  < > 65.2* 65.5*  PLT 249  < >  323 298  < > = values in this interval not displayed. Coagulation:  No results found for this basename: LABPROT, INR,  in the last 168 hours Cardiac Enzymes:  No results found for this basename: CKTOTAL, CKMB, CKMBINDEX, TROPONINI,  in the last 168 hours Urinalysis: No results found for this basename: COLORURINE, APPERANCEUR, LABSPEC, PHURINE, GLUCOSEU, HGBUR, BILIRUBINUR, KETONESUR, PROTEINUR, UROBILINOGEN, NITRITE, LEUKOCYTESUR,  in the last 168 hours Lipid Panel    Component Value Date/Time   CHOL 185 06/22/2012 0525   TRIG 294* 06/28/2012 0600   HDL 58 06/22/2012 0525   CHOLHDL 3.2 06/22/2012 0525   VLDL 18 06/22/2012 0525   LDLCALC 109* 06/22/2012 0525   HgbA1C  Lab Results  Component Value Date   HGBA1C 6.2* 06/24/2012   Urine Drug Screen:   No results found for this basename: labopia,  cocainscrnur,  labbenz,   amphetmu,  thcu,  labbarb    Alcohol Level: No results found for this basename: ETH,  in the last 168 hours  Ct Head Wo Contrast 07/03/2012 No significant change. Blood in the occipital horn of the right lateral ventricle is more apparent. Midline shift is essentially unchanged. No new hemorrhage on the left. 06/26/2012    Overall slight improvement in the degree of midline shift compared with priors.  No new areas of infarction.  No convincing signs for trapped right lateral ventricle on today's exam.  Stable multifocal predominantly left MCA territory infarcts with hemorrhagic transformation; no new infarcts or new lobar hemorrhage from priors.    06/24/2012    Large left parietal - frontal - posterior left temporal lobe hemorrhagic infarct and mass effect upon the left lateral ventricle relatively similar to the recent exam.  Interval development of interventricular blood within the tract right lateral ventricle.  Subarachnoid blood most notable right convexity and right sylvian fissure more apparent than on the prior exam.  Bilateral anterior frontal lobe infarcts greater on the left with minimal associated petechial hemorrhage.   06/23/2012   Minimal change in the large left hemorrhagic infarct.  Minimal change in the midline shift.    06/22/2012    Image quality degraded by significant motion.  There is a large area of hemorrhage in the left parietal lobe with mass effect and 10 mm midline shift.  This is most consistent with hemorrhagic infarction following TPA.    06/22/2012   Ct Portable Head W/o Cm Large area of hemorrhagic infarction left parietal lobe is similar. There is improvement in blood in the corpus collosum/ caudate on the left.  8 mm midline shift to the right without hydrocephalus.   06/21/2012    Chronic ischemic changes, right greater than left.  No acute abnormality.    MRI of the brain    MRA of the brain    2D Echocardiogram  ejection fraction was in the range of 60% to 65%.  Possible mild hypokinesis of the apical myocardium. - Aortic valve: Trivial regurgitation. - Mitral valve: Moderate regurgitation. - Left atrium: The atrium was mildly dilated. - Right ventricle: There was moderate hypertrophy. - Right atrium: The atrium was mildly dilated. - Pulmonary arteries: Systolic pressure was mildly  Carotid Doppler  Technically limited due to right IJ line and patient immobility. No evidence of significant ICA stenosis noted. Antegrade vertebral flow.  CXR   07/03/2012 Bibasilar atelectasis. 07/01/2012   Improved aeration bilaterally.    06/27/2012    Improved bibasilar aeration.  06/26/2012   1.  Low inspiratory volumes with new  patchy right basilar opacity. Differential considerations include developing infiltrate, asymmetric pulmonary edema, and potentially atelectasis although this is less likely given the imaging appearance. 2.  Stable left retrocardiac opacity may reflect atelectasis or infiltrate. 3.  Pulmonary vascular congestion bordering on mild interstitial edema.    06/24/2012   Stable cardiomegaly.  No acute findings.    06/23/2012  1.  Nasogastric tube placement least as far as the stomach. 2.  Interval increase in bilateral edema or infiltrates.   06/22/2012  Tip of right jugular line projects over SVC without pneumothorax. Bibasilar atelectasis.   Original Report Authenticated By: Ulyses Southward, M.D.  06/21/2012   Moderate cardiomegaly.  No active disease.   EKG  normal sinus rhythm.   EEG no epileptiform activity  Therapy Recommendations    Physical Exam  General: The patient is intubated.  Neurologic Exam  Mental Status:  Eyes closed. Opens eyes to stim, no verbal. Not following commands.  Cranial nerves: Facial symmetry is present. The patient has a gaze preference to the left.  The eyes cannot be dolled across midline. Pupils are equal and reactive.  Motor: MINIMAL SPONT MOVEMENT IN LUE.  WITHDRAWAL IN LLE. RUE AND RLE 0/5.   Reflexes:  SLIGHTLY BRISK IN LUE AND LLE.    ASSESSMENT [name] is a 57 y.o. male presenting with expressive aphasia with unintelligible speech output. He is status post IV t-PA 06/21/2012 at 20:17. Symptoms were consistent with left middle cerebral artery acute stroke.  Imaging confirmed a large left parietal - frontal - posterior left temporal lobe ischemic infarct with old right frontal infarct.  Followup CT repeated due to acute delirium, tonic-clonic seizure activity in left arm, decreasing consciousness. That scan 06/22/2012 shows that there is a large 6 cm  area of hemorrhagic transformation in the left parietal lobe with cerebral edema - mass effect upon the left lateral ventricle and 10 mm midline shift.  Patient was subsequently intubated for airway protection. 3% saline started to decrease cytotoxic edema. F/u CT shows interval development of interventricular blood within the tract right lateral ventricle and subarachnoid blood most notable right convexity and right sylvian fissure  Initial infarcts felt to be embolic, source most likely cardioembolic from acute MI. On no antithrombotics prior to admission. Now on no antithrombitics due to hemorrhage.  Patient with resultant VDRF, global aphasia, right hemiparesis, dysphagia.   Hypernatremia, induced with 3% Saline to decrease cerebral edema, currently on hold,  sodium remains elevated at 156  Hyperlipidemia, LDL 109, not on statin prior to admission, on atorvastatin now  Chronic kidney disease, stage 3  Malignant Hypertension, on admission, changing to medications and weaning off cardene with SBP goal < 160  Possible NSTEMI with Troponin I normal on admission, elevation to 11.15 peak hospital D#2, [facility] consulted, not a candidate for cardiac intervention until stabilizes.   Tachycardia, resolved. Placed On lopressor 100mg  total daily.   Seizure-like activity with hemorrhage, EEG without epileptiform activity. On dilantin.  Right  basilar pneumonia, MSSA, treated with ancef  Hospital day # 13  TREATMENT/PLAN  SBP goal now < 180, (157/96) with changing to po medication, norvasc increased and added to beta-blocker regimen. BP >180/110. Clonidine added.  Increase free water to help lower sodium slowly  Continue Tube feeds  Needs trach, PEG.  Further discussion with family regarding trach/peg Wednesday  Preparation for family conference today.  OOB. Therapy evals once extubated   Gwendolyn Lima. Manson Passey, [facility], MBA, MHA [facility] Stroke Center Pager: [phone] 07/04/2012  7:42 AM  I reviewed note and agree with plan. Family meeting later today.  Suanne Marker, MD 07/04/2012, 10:06 PM Certified in Neurology, Neurophysiology and Neuroimaging  [facility] [address], [address] [place], [place] [address] [phone]

## 2012-07-04 NOTE — Progress Notes (Signed)
Inpatient Diabetes Program Recommendations  AACE/ADA: New Consensus Statement on Inpatient Glycemic Control (2013)  Target Ranges:  Prepandial:   less than 140 mg/dL      Peak postprandial:   less than 180 mg/dL (1-2 hours)      Critically ill patients:  140 - 180 mg/dL   Reason for Visit: Results for DONEVAN, BILLER (MRN [id number]) as of 07/04/2012 13:52  Ref. Range 07/03/2012 23:35 07/04/2012 03:59 07/04/2012 04:00 07/04/2012 07:48 07/04/2012 11:44  Glucose-Capillary Latest Range: 70-99 mg/dL 409 (H) 811 (H)  914 (H) 241 (H)   CBG's continue to be greater than 200 mg/dL.  Novolog meal coverage added 07/03/12.  Consider also adding Lantus 12 units daily.  If CBG's remain greater than 200 mg/dL, may need IV insulin.

## 2012-07-04 NOTE — Progress Notes (Signed)
Spoke with patient's wife, sister, son and daughter.  After extensive discussion decision was made to make patient a full NCB with extubation on Thursday or Friday and pending progression will either proceed to full comfort care once respiratory failure is imminent.  Patient will also be made a full NCB.  Additional CC time of 45 min.  Alyson Reedy, M.D. [facility] Pulmonary/Critical Care Medicine. Pager: [phone]. After hours pager: [phone].

## 2012-07-05 ENCOUNTER — Inpatient Hospital Stay (HOSPITAL_COMMUNITY): Payer: Medicare Other

## 2012-07-05 DIAGNOSIS — J15211 Pneumonia due to Methicillin susceptible Staphylococcus aureus: Secondary | ICD-10-CM

## 2012-07-05 LAB — BLOOD GAS, ARTERIAL
Bicarbonate: 25.1 mEq/L — ABNORMAL HIGH (ref 20.0–24.0)
FIO2: 0.4 %
MECHVT: 600 mL
TCO2: 26.1 mmol/L (ref 0–100)
pCO2 arterial: 32.9 mmHg — ABNORMAL LOW (ref 35.0–45.0)
pH, Arterial: 7.494 — ABNORMAL HIGH (ref 7.350–7.450)
pO2, Arterial: 163 mmHg — ABNORMAL HIGH (ref 80.0–100.0)

## 2012-07-05 LAB — CBC
MCH: 19.6 pg — ABNORMAL LOW (ref 26.0–34.0)
MCHC: 29.4 g/dL — ABNORMAL LOW (ref 30.0–36.0)
Platelets: 313 10*3/uL (ref 150–400)
RDW: 21.3 % — ABNORMAL HIGH (ref 11.5–15.5)

## 2012-07-05 LAB — PHOSPHORUS: Phosphorus: 4.1 mg/dL (ref 2.3–4.6)

## 2012-07-05 LAB — GLUCOSE, CAPILLARY
Glucose-Capillary: 204 mg/dL — ABNORMAL HIGH (ref 70–99)
Glucose-Capillary: 204 mg/dL — ABNORMAL HIGH (ref 70–99)
Glucose-Capillary: 219 mg/dL — ABNORMAL HIGH (ref 70–99)

## 2012-07-05 LAB — BASIC METABOLIC PANEL
Calcium: 9 mg/dL (ref 8.4–10.5)
GFR calc Af Amer: 42 mL/min — ABNORMAL LOW (ref >90)
GFR calc non Af Amer: 36 mL/min — ABNORMAL LOW (ref >90)
Potassium: 4.1 mEq/L (ref 3.5–5.1)
Sodium: 157 mEq/L — ABNORMAL HIGH (ref 135–145)

## 2012-07-05 LAB — MAGNESIUM: Magnesium: 2.5 mg/dL (ref 1.5–2.5)

## 2012-07-05 MED ORDER — FUROSEMIDE 10 MG/ML IJ SOLN
40.0000 mg | Freq: Four times a day (QID) | INTRAMUSCULAR | Status: AC
  Administered 2012-07-05 (×3): 40 mg via INTRAVENOUS
  Filled 2012-07-05 (×3): qty 4

## 2012-07-05 MED ORDER — FREE WATER
250.0000 mL | Freq: Four times a day (QID) | Status: DC
  Administered 2012-07-05 – 2012-07-06 (×4): 250 mL

## 2012-07-05 NOTE — Progress Notes (Signed)
ANTIBIOTIC CONSULT NOTE - FOLLOW UP  Pharmacy Consult for ancef Indication: PNA  No Known Allergies  Patient Measurements: Height: 5\' 11"  (180.3 cm) Weight: 156 lb 15.5 oz (71.2 kg) IBW/kg (Calculated) : 75.3  Vital Signs: Temp: 100.6 F (38.1 C) (06/26 1000) Temp src: Other (Comment) (06/26 1000) BP: 170/89 mmHg (06/26 1000) Pulse Rate: 79 (06/26 1000) Intake/Output from previous day: 06/25 0701 - 06/26 0700 In: 3340 [I.V.:510; NG/GT:2680; IV Piggyback:150] Out: 2507 [Urine:2505; Stool:2] Intake/Output from this shift: Total I/O In: 470 [I.V.:60; NG/GT:410] Out: 451 [Urine:450; Stool:1]  Labs:  Recent Labs  07/03/12 0500 07/04/12 0400 07/05/12 0500  WBC 18.6* 17.3* 14.9*  HGB 11.9* 11.7* 11.8*  PLT 323 298 313  CREATININE 1.88* 1.81* 1.95*   Estimated Creatinine Clearance: 42.1 ml/min (by C-G formula based on Cr of 1.95). No results found for this basename: VANCOTROUGH, VANCOPEAK, VANCORANDOM, GENTTROUGH, GENTPEAK, GENTRANDOM, TOBRATROUGH, TOBRAPEAK, TOBRARND, AMIKACINPEAK, AMIKACINTROU, AMIKACIN,  in the last 72 hours     Assessment: 57 yo male with MSSA PNA on ancef (day 9 of antibiotics).  WBC= 14.9, tmax= 101.8, SCr= 1.95, CrCl ~40. Patent noted now DNR with plans for terminal extubation on 6/27.  Vanc 6/17>>6/18 Cefazolin 6/18>>  6/15 TA - abundant SA - pan-S 6/12 MRSA - NEG   Plan:  -No ancef dose changes needed -Will follow patient progress  Harland German, Pharm D 07/05/2012 10:13 AM

## 2012-07-05 NOTE — Progress Notes (Signed)
Nursing 1615 Patient tachycardic and restless, patient setting off the ventilator alarms due to tachypnea.  RT notified to put patient back on full support on vent.  Fentanyl IVP given per order.  Will continue to assess.

## 2012-07-05 NOTE — Progress Notes (Signed)
Placed on full support due to increased WOB

## 2012-07-05 NOTE — Progress Notes (Signed)
PULMONARY  / CRITICAL CARE MEDICINE  Name: [name] MRN: [id number] DOB: 11/13/1955    ADMISSION DATE:  06/21/2012 CONSULTATION DATE:  6/13  REFERRING MD :  sethi  PRIMARY SERVICE: stroke team   CHIEF COMPLAINT:  Asked to see for ventilator mx and supportive critical care services   BRIEF PATIENT DESCRIPTION:  This is 57 year old male admitted 6/12 w/ Left posterior MCA infarct. Got TPA. Found to have AMS the am of 6/13, CT showed new left ICH w/ midline shift. PCCM asked to see for airway support, IV access and supportive critical care.   SIGNIFICANT EVENTS / STUDIES:  CT head 6/12: Chronic ischemic changes, right greater than left. No acute abnormality CT head 6/13: Image quality degraded by significant motion. There is a large area of hemorrhage in the left parietal lobe with mass effect and 10 mm midline shift. This is most consistent with  hemorrhagic infarction following TPA. ECHO 6/14: EF 60-65%, mild apical hypokinesis CT head 6/15: Large left parietal - frontal - posterior left temporal lobe hemorrhagic infarct and mass effect upon the left lateral ventricle relatively similar to the recent exam. Interval development of interventricular blood within the tract right lateral ventricle. EEG 6/19: general slowing, no seizure activity that correlates w tremor CT head 6/24: No change in blood in occipital horn of Rt lateral ventricle with midline shift  LINES / TUBES: OETT 6/13 (DIFFICULT AIRWAY, Cords swollen, glide scope, #7, very deep)>>> Right IJ 6/13>>> Right radial A-line 6/13 >>> out  CULTURES: Sputum 6/15>>> s aureus >> MSSA  ANTIBIOTICS: vanco 6/17 (s aureus sputum) >> 6/18 Ancef 6/18 (MSSA) >> 6/27  SUBJECTIVE:  Tolerates pressure support.  VITAL SIGNS: Temp:  [99.3 F (37.4 C)-101.8 F (38.8 C)] 100.6 F (38.1 C) (06/26 0805) Pulse Rate:  [74-105] 101 (06/26 0805) Resp:  [15-43] 26 (06/26 0805) BP: (117-203)/(53-126) 171/92 mmHg (06/26 0805) SpO2:  [99  %-100 %] 99 % (06/26 0805) FiO2 (%):  [40 %] 40 % (06/26 0805) Weight:  [71.2 kg (156 lb 15.5 oz)] 71.2 kg (156 lb 15.5 oz) (06/26 0420) VENTILATOR SETTINGS: Vent Mode:  [-] PRVC FiO2 (%):  [40 %] 40 % Set Rate:  [14 bmp] 14 bmp Vt Set:  [600 mL] 600 mL PEEP:  [5 cmH20] 5 cmH20 Plateau Pressure:  [17 cmH20-20 cmH20] 17 cmH20 INTAKE / OUTPUT: Intake/Output     06/25 0701 - 06/26 0700 06/26 0701 - 06/27 0700   I.V. (mL/kg) 510 (7.2) 20 (0.3)   NG/GT 2680 90   IV Piggyback 150    Total Intake(mL/kg) 3340 (46.9) 110 (1.5)   Urine (mL/kg/hr) 2505 (1.5) 200 (1.3)   Stool 2 (0)    Total Output 2507 200   Net +833 -90        Stool Occurrence 5 x      PHYSICAL EXAMINATION: General: No distress Neuro:  Open's eyes spontaneously, not tracking, not following commands HEENT: ETT in place  Cardiovascular:  regular Lungs: no wheeze Abdomen: soft, non tender, good bowel sounds  Musculoskeletal: no edema Skin:  Intact   LABS:  Recent Labs Lab 07/03/12 0500 07/04/12 0400 07/05/12 0500  NA 158* 156* 157*  K 3.9 4.3 4.1  CL 120* 118* 119*  CO2 27 28 28   BUN 48* 53* 55*  CREATININE 1.88* 1.81* 1.95*  GLUCOSE 220* 265* 209*    Recent Labs Lab 07/03/12 0500 07/04/12 0400 07/05/12 0500  HGB 11.9* 11.7* 11.8*  HCT 39.3 38.5* 40.1  WBC  18.6* 17.3* 14.9*  PLT 323 298 313   CBG (last 3)   Recent Labs  07/04/12 2342 07/05/12 0349 07/05/12 0738  GLUCAP 252* 171* 204*   Imaging: Dg Chest Port 1 View  07/05/2012   *RADIOLOGY REPORT*  Clinical Data: ET tube placement.  PORTABLE CHEST - 1 VIEW  Comparison: 07/03/2012.  Findings: Support devices are in stable position.  Heart is normal size.  Areas of atelectasis bilaterally, improved.  No effusions. No acute bony abnormality.  IMPRESSION: Bibasilar atelectasis, improving.   Original Report Authenticated By: Charlett Nose, M.D.   ASSESSMENT / PLAN:  NEUROLOGIC A:   CVA c/b ICH s/p TPA-->ICH 6/15, [facility] 6/17 Cerebral edema >>  off 3% NS P:   - Dilantin for seizure prevention. - D/C sedation for terminal extubation in AM per family's request.  PULMONARY A: Acute respiratory failure in setting of acute encephalopathy d/t ICH Difficult airway: required glide scope and bougie/ cords swollen required #7 P:   - Continue PS trials and will terminally extubate in AM by family's request. - After family meeting 6/25 decision was made to make patient full DNR and extubate on 6/26, however, family requested that we wait til 6/27 for terminal extubation. - F/u CXR daily.  CARDIOVASCULAR A:  HTN crisis NSTEMI -peak Tp 11 P:  - SBP goal < 180 - Continue lipitor, amlodipine, lopressor for now until terminal extubation in AM. - No ASA due to ICH.  RENAL A:   Acute renal failure >> improving Hypokalemia Hypernatremia  P:   - Increase free water as ordered. - Monitor BMET. - Avoid nephrotoxins. - 3 doses of lasix prior to terminal extubation in AM.  GASTROINTESTINAL A:  Nutrition. Ileus >> improved. P:   - Continue tube feeds. - Will arrange for PEG once trached if family wishes for full support. - D/C reglan.  HEMATOLOGIC A:  Coagulopathy due to tPA, resolved  P:  - Trend CBC. - SCDs.  INFECTIOUS A: R basilar PNA > MSSA P:   - D9/10 Abx, current on ancef.  ENDOCRINE A:  Minimal hyperglycemia  P:   - Monitor CBG. - SSI if needed.  Summary: Prognosis for neuro recover poor.  Met with family 6/25, made full DNR and terminal extubation was for 6/26 but family decided to wait for the full 14 days which would be 6/26 prior to terminal extubation.  CC time 35 min.  Alyson Reedy, M.D. [facility] Pulmonary/Critical Care Medicine. Pager: [phone]. After hours pager: [phone].

## 2012-07-05 NOTE — Progress Notes (Signed)
Stroke Team Progress Note  HISTORY [name] is an 57 y.o. 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 No focal weakness was noted. Patient has been taking aspirin 81 mg per day. CT scan showed no acute intracranial abnormality. NIH stroke score was 8. Patient had marked expressive aphasia as well as gaze preference to the left side and right visual field defect to visual confrontation. He was deemed a candidate for intravenous thrombolytic therapy with TPA.  LSN: [id number] on 06/21/2012  tPA Given: Yes  MRankin: 2  SUBJECTIVE. Remains intubated. No change   OBJECTIVE Most recent Vital Signs: Filed Vitals:   07/05/12 0530 07/05/12 0600 07/05/12 0615 07/05/12 0700  BP: 164/93 193/110 173/95 131/82  Pulse: 85 83 74 74  Temp: 100.4 F (38 C) 100 F (37.8 C) 99.9 F (37.7 C) 100.4 F (38 C)  TempSrc:      Resp: 19 19 17 16   Height:      Weight:      SpO2: 100% 100% 100% 100%   CBG (last 3)   Recent Labs  07/04/12 2008 07/04/12 2342 07/05/12 0349  GLUCAP 198* 252* 171*    IV Fluid Intake:   . sodium chloride 20 mL/hr at 07/05/12 0700  . feeding supplement (JEVITY 1.2 CAL) 1,000 mL (07/05/12 0255)  . niCARDipine Stopped (07/04/12 0915)    MEDICATIONS  . amLODipine  10 mg Per Tube Daily  . antiseptic oral rinse  15 mL Mouth Rinse QID  . atorvastatin  20 mg Oral q1800  .  ceFAZolin (ANCEF) IV  2 g Intravenous Q8H  . chlorhexidine  15 mL Mouth Rinse BID  . cloNIDine  0.1 mg Oral BID WC  . feeding supplement  30 mL Per Tube TID  . free water  200 mL Per Tube Q6H  . insulin aspart  0-9 Units Subcutaneous Q4H  . insulin aspart  3 Units Subcutaneous Q4H  . metoCLOPramide  (REGLAN) injection  10 mg Intravenous Q8H  . metoprolol tartrate  25 mg Oral Q6H  . pantoprazole sodium  40 mg Per Tube Q24H  . phenytoin  100 mg Per Tube TID  . potassium chloride  40 mEq Oral BID  . sodium chloride  10-40 mL Intracatheter Q12H   PRN:  acetaminophen, acetaminophen, artificial tears, fentaNYL, labetalol, LORazepam, metoprolol, midazolam, senna-docusate  Diet:  NPO tube feeds  Activity:  Bedrest DVT Prophylaxis:  SCD  CLINICALLY SIGNIFICANT STUDIES Basic Metabolic Panel:   Recent Labs Lab 07/03/12 0500 07/04/12 0400 07/05/12 0500  NA 158* 156* 157*  K 3.9 4.3 4.1  CL 120* 118* 119*  CO2 27 28 28   GLUCOSE 220* 265* 209*  BUN 48* 53* 55*  CREATININE 1.88* 1.81* 1.95*  CALCIUM 8.8 8.8 9.0  MG 2.5  --  2.5  PHOS 3.8  --  4.1   Liver Function Tests:  No results found for this basename: AST, ALT, ALKPHOS, BILITOT, PROT, ALBUMIN,  in the last 168 hours CBC:   Recent Labs Lab 06/29/12 0500  07/04/12 0400 07/05/12 0500  WBC 16.3*  < > 17.3* 14.9*  NEUTROABS 13.7*  --   --   --   HGB 13.1  < > 11.7* 11.8*  HCT 42.7  < > 38.5* 40.1  MCV 64.0*  < > 65.5* 66.6*  PLT 249  < > 298 313  < > = values in this interval not displayed. Coagulation:  No results found for this basename: LABPROT, INR,  in the last 168 hours Cardiac Enzymes:  No results found for this basename: CKTOTAL, CKMB, CKMBINDEX, TROPONINI,  in the last 168 hours Urinalysis: No results found for this basename: COLORURINE, APPERANCEUR, LABSPEC, PHURINE, GLUCOSEU, HGBUR, BILIRUBINUR, KETONESUR, PROTEINUR, UROBILINOGEN, NITRITE, LEUKOCYTESUR,  in the last 168 hours Lipid Panel    Component Value Date/Time   CHOL 185 06/22/2012 0525   TRIG 294* 06/28/2012 0600   HDL 58 06/22/2012 0525   CHOLHDL 3.2 06/22/2012 0525   VLDL 18 06/22/2012 0525   LDLCALC 109* 06/22/2012 0525   HgbA1C  Lab Results  Component Value Date   HGBA1C 6.2* 06/24/2012   Urine Drug Screen:   No results found for this basename:  labopia,  cocainscrnur,  labbenz,  amphetmu,  thcu,  labbarb    Alcohol Level: No results found for this basename: ETH,  in the last 168 hours  Ct Head Wo Contrast 07/03/2012 No significant change. Blood in the occipital horn of the right lateral ventricle is more apparent. Midline shift is essentially unchanged. No new hemorrhage on the left. 06/26/2012    Overall slight improvement in the degree of midline shift compared with priors.  No new areas of infarction.  No convincing signs for trapped right lateral ventricle on today's exam.  Stable multifocal predominantly left MCA territory infarcts with hemorrhagic transformation; no new infarcts or new lobar hemorrhage from priors.    06/24/2012    Large left parietal - frontal - posterior left temporal lobe hemorrhagic infarct and mass effect upon the left lateral ventricle relatively similar to the recent exam.  Interval development of interventricular blood within the tract right lateral ventricle.  Subarachnoid blood most notable right convexity and right sylvian fissure more apparent than on the prior exam.  Bilateral anterior frontal lobe infarcts greater on the left with minimal associated petechial hemorrhage.   06/23/2012   Minimal change in the large left hemorrhagic infarct.  Minimal change in the midline shift.    06/22/2012    Image quality degraded by significant motion.  There is a large area of hemorrhage in the left parietal lobe with mass effect and 10 mm midline shift.  This is most consistent with hemorrhagic infarction following TPA.    06/22/2012   Ct Portable Head W/o Cm Large area of hemorrhagic infarction left parietal lobe is similar. There is improvement in blood in the corpus collosum/ caudate on the left.  8 mm midline shift to the right without hydrocephalus.   06/21/2012    Chronic ischemic changes, right greater than left.  No acute abnormality.    MRI of the brain    MRA of the brain    2D Echocardiogram  ejection fraction  was in the range of 60% to 65%. Possible mild hypokinesis of the apical myocardium. - Aortic valve: Trivial regurgitation. - Mitral valve: Moderate regurgitation. - Left atrium: The atrium was mildly dilated. - Right ventricle: There was moderate hypertrophy. - Right atrium: The atrium was mildly dilated. - Pulmonary arteries: Systolic pressure was mildly  Carotid Doppler  Technically limited due to right IJ line and patient immobility. No evidence of significant ICA stenosis noted. Antegrade vertebral flow.  CXR   07/03/2012 Bibasilar atelectasis. 07/01/2012   Improved aeration bilaterally.    06/27/2012    Improved  bibasilar aeration.  06/26/2012   1.  Low inspiratory volumes with new patchy right basilar opacity. Differential considerations include developing infiltrate, asymmetric pulmonary edema, and potentially atelectasis although this is less likely given the imaging appearance. 2.  Stable left retrocardiac opacity may reflect atelectasis or infiltrate. 3.  Pulmonary vascular congestion bordering on mild interstitial edema.    06/24/2012   Stable cardiomegaly.  No acute findings.    06/23/2012  1.  Nasogastric tube placement least as far as the stomach. 2.  Interval increase in bilateral edema or infiltrates.   06/22/2012  Tip of right jugular line projects over SVC without pneumothorax. Bibasilar atelectasis.   Original Report Authenticated By: Ulyses Southward, M.D.  06/21/2012   Moderate cardiomegaly.  No active disease.   EKG  normal sinus rhythm.   EEG no epileptiform activity  Therapy Recommendations    Physical Exam  General: The patient is intubated.  Neurologic Exam  Mental Status:  Eyes closed. Opens eyes to stim, no verbal. Not following commands.  Cranial nerves: Facial symmetry is present. The patient has a gaze preference to the left.  The eyes cannot be dolled across midline. Pupils are equal and reactive.  Motor: MINIMAL SPONT MOVEMENT IN LUE.  WITHDRAWAL IN LLE. RUE  AND RLE 0/5.   Reflexes: SLIGHTLY BRISK IN LUE AND LLE.    ASSESSMENT [name] is a 57 y.o. male presenting with expressive aphasia with unintelligible speech output. He is status post IV t-PA 06/21/2012 at 20:17. Symptoms were consistent with left middle cerebral artery acute stroke.  Imaging confirmed a large left parietal - frontal - posterior left temporal lobe ischemic infarct with old right frontal infarct.  Followup CT repeated due to acute delirium, tonic-clonic seizure activity in left arm, decreasing consciousness. That scan 06/22/2012 shows that there is a large 6 cm  area of hemorrhagic transformation in the left parietal lobe with cerebral edema - mass effect upon the left lateral ventricle and 10 mm midline shift.  Patient was subsequently intubated for airway protection. 3% saline started to decrease cytotoxic edema. F/u CT shows interval development of interventricular blood within the tract right lateral ventricle and subarachnoid blood most notable right convexity and right sylvian fissure  Initial infarcts felt to be embolic, source most likely cardioembolic from acute MI. On no antithrombotics prior to admission. Now on no antithrombitics due to hemorrhage.  Patient with resultant VDRF, global aphasia, right hemiparesis, dysphagia.   Hypernatremia, induced with 3% Saline to decrease cerebral edema, currently on hold,  sodium remains elevated at 156  Hyperlipidemia, LDL 109, not on statin prior to admission, on atorvastatin now  Chronic kidney disease, stage 3  Malignant Hypertension, on admission, changing to medications and weaning off cardene with SBP goal < 160  Possible NSTEMI with Troponin I normal on admission, elevation to 11.15 peak hospital D#2, [facility] consulted, not a candidate for cardiac intervention until stabilizes.   Tachycardia, resolved. Placed On lopressor 100mg  total daily.   Seizure-like activity with hemorrhage, EEG without epileptiform  activity. On dilantin.  Right basilar pneumonia, MSSA, treated with ancef  Hospital day # 14  TREATMENT/PLAN  SBP goal now < 180, (157/96) with changing to po medication, norvasc increased and added to beta-blocker regimen. BP >180/110. Clonidine added.  Patient is now DNR, he will extubated tomorrow and family has so far agreed to let nature take its course after discussion yesterday with CCM.  Gwendolyn Lima. Manson Passey, PAC, MBA, MHA [facility] Pager: [phone]  07/05/2012 7:38 AM   I evaluated and examined patient, reviewed records, labs and imaging, and agree with note and plan. Extubation tomorrow.  Suanne Marker, MD 07/05/2012, 10:05 PM Certified in Neurology, Neurophysiology and Neuroimaging [facility] - Stroke Team  Please refer to [email or web address] for on-call Stroke MD

## 2012-07-06 ENCOUNTER — Inpatient Hospital Stay (HOSPITAL_COMMUNITY): Payer: Medicare Other

## 2012-07-06 LAB — BASIC METABOLIC PANEL
CO2: 27 mEq/L (ref 19–32)
Chloride: 114 mEq/L — ABNORMAL HIGH (ref 96–112)
Glucose, Bld: 287 mg/dL — ABNORMAL HIGH (ref 70–99)
Potassium: 4.4 mEq/L (ref 3.5–5.1)
Sodium: 152 mEq/L — ABNORMAL HIGH (ref 135–145)

## 2012-07-06 LAB — GLUCOSE, CAPILLARY: Glucose-Capillary: 177 mg/dL — ABNORMAL HIGH (ref 70–99)

## 2012-07-06 LAB — CBC
Hemoglobin: 11.5 g/dL — ABNORMAL LOW (ref 13.0–17.0)
MCH: 19.6 pg — ABNORMAL LOW (ref 26.0–34.0)
RBC: 5.88 MIL/uL — ABNORMAL HIGH (ref 4.22–5.81)

## 2012-07-06 LAB — PHOSPHORUS: Phosphorus: 4.6 mg/dL (ref 2.3–4.6)

## 2012-07-06 MED ORDER — METOPROLOL TARTRATE 1 MG/ML IV SOLN
2.5000 mg | Freq: Four times a day (QID) | INTRAVENOUS | Status: DC
  Administered 2012-07-06 – 2012-07-11 (×20): 2.5 mg via INTRAVENOUS
  Filled 2012-07-06 (×23): qty 5

## 2012-07-06 MED ORDER — PHENYTOIN SODIUM 50 MG/ML IJ SOLN
100.0000 mg | Freq: Three times a day (TID) | INTRAMUSCULAR | Status: DC
  Administered 2012-07-06 – 2012-07-12 (×18): 100 mg via INTRAVENOUS
  Filled 2012-07-06 (×21): qty 2

## 2012-07-06 MED ORDER — CLONIDINE HCL 0.2 MG/24HR TD PTWK
0.2000 mg | MEDICATED_PATCH | TRANSDERMAL | Status: DC
  Administered 2012-07-06 – 2012-07-13 (×2): 0.2 mg via TRANSDERMAL
  Filled 2012-07-06 (×2): qty 1

## 2012-07-06 NOTE — Plan of Care (Signed)
Called pharmacy to have them look at pt's oral meds and change them to IV administrations.

## 2012-07-06 NOTE — Plan of Care (Signed)
Problem: tPA Day Progression Outcomes-Only if tPA administered Goal: Post tPA image without hemorrhage CT with hemorrhage noted

## 2012-07-06 NOTE — Procedures (Signed)
Extubation Procedure Note  Patient Details:   Name: [name] DOB: Aug 11, 1955 MRN: [id number]   Airway Documentation:     Evaluation  O2 sats: stable throughout Complications: No apparent complications Patient did tolerate procedure well. Bilateral Breath Sounds: Rhonchi Suctioning: Airway No  Patient extubated and placed on 2LNC. Patient had a positive cuff leak. HR-101 BP-144/84 RR-26. Patient had some upper airway wheezing. RT will continue to monitor  Adolm Joseph 07/06/2012, 10:18 AM

## 2012-07-06 NOTE — Progress Notes (Signed)
eLink Physician-Brief Progress Note Patient Name: [name] DOB: 07/16/1955 MRN: [id number]  Date of Service  07/06/2012   HPI/Events of Note   Pt extubated, NG out.   eICU Interventions  Change PO to IV meds   Intervention Category Minor Interventions: Agitation / anxiety - evaluation and management;Routine modifications to care plan (e.g. PRN medications for pain, fever)  Shan Levans 07/06/2012, 5:55 PM

## 2012-07-06 NOTE — Progress Notes (Signed)
Unable to pull back blood from central line.  All three ports do flush.  Labs ordered.  Lab called to collect and IV team paged to come look at line.  Will continue to monitor. [name]

## 2012-07-06 NOTE — Progress Notes (Signed)
PULMONARY  / CRITICAL CARE MEDICINE  Name: [name] MRN: [id number] DOB: June 23, 1955    ADMISSION DATE:  06/21/2012 CONSULTATION DATE:  6/13  REFERRING MD :  sethi  PRIMARY SERVICE: stroke team   CHIEF COMPLAINT:  Asked to see for ventilator mx and supportive critical care services   BRIEF PATIENT DESCRIPTION:  This is 57 year old male admitted 6/12 w/ Left posterior MCA infarct. Got TPA. Found to have AMS the am of 6/13, CT showed new left ICH w/ midline shift. PCCM asked to see for airway support, IV access and supportive critical care.   SIGNIFICANT EVENTS / STUDIES:  CT head 6/12: Chronic ischemic changes, right greater than left. No acute abnormality CT head 6/13: Image quality degraded by significant motion. There is a large area of hemorrhage in the left parietal lobe with mass effect and 10 mm midline shift. This is most consistent with  hemorrhagic infarction following TPA. ECHO 6/14: EF 60-65%, mild apical hypokinesis CT head 6/15: Large left parietal - frontal - posterior left temporal lobe hemorrhagic infarct and mass effect upon the left lateral ventricle relatively similar to the recent exam. Interval development of interventricular blood within the tract right lateral ventricle. EEG 6/19: general slowing, no seizure activity that correlates w tremor CT head 6/24: No change in blood in occipital horn of Rt lateral ventricle with midline shift  LINES / TUBES: OETT 6/13 (DIFFICULT AIRWAY, Cords swollen, glide scope, #7, very deep)>>> Right IJ 6/13>>> Right radial A-line 6/13 >>> out  CULTURES: Sputum 6/15>>> s aureus >> MSSA  ANTIBIOTICS: vanco 6/17 (s aureus sputum) >> 6/18 Ancef 6/18 (MSSA) >> 6/27  SUBJECTIVE:  Tolerates pressure support.  VITAL SIGNS: Temp:  [99.1 F (37.3 C)-101.3 F (38.5 C)] 100.6 F (38.1 C) (06/27 0900) Pulse Rate:  [65-105] 96 (06/27 0900) Resp:  [14-35] 28 (06/27 0900) BP: (111-179)/(76-108) 165/103 mmHg (06/27 0955) SpO2:  [99  %-100 %] 100 % (06/27 0900) FiO2 (%):  [30 %-40 %] 30 % (06/27 0800) Weight:  [68.8 kg (151 lb 10.8 oz)] 68.8 kg (151 lb 10.8 oz) (06/27 0500) VENTILATOR SETTINGS: Vent Mode:  [-] CPAP;PSV FiO2 (%):  [30 %-40 %] 30 % Set Rate:  [14 bmp] 14 bmp Vt Set:  [600 mL] 600 mL PEEP:  [5 cmH20] 5 cmH20 Pressure Support:  [8 cmH20] 8 cmH20 Plateau Pressure:  [16 cmH20-19 cmH20] 18 cmH20 INTAKE / OUTPUT: Intake/Output     06/26 0701 - 06/27 0700 06/27 0701 - 06/28 0700   I.V. (mL/kg) 460 (6.7) 60 (0.9)   Other 110    NG/GT 2210 120   IV Piggyback 150    Total Intake(mL/kg) 2930 (42.6) 180 (2.6)   Urine (mL/kg/hr) 3820 (2.3) 250 (1.2)   Stool 2 (0)    Total Output 3822 250   Net -892 -70        Stool Occurrence 3 x      PHYSICAL EXAMINATION: General: No distress Neuro:  Open's eyes spontaneously, not tracking, not following commands HEENT: ETT in place  Cardiovascular:  regular Lungs: no wheeze Abdomen: soft, non tender, good bowel sounds  Musculoskeletal: no edema Skin:  Intact   LABS:  Recent Labs Lab 07/04/12 0400 07/05/12 0500 07/06/12 0545  NA 156* 157* 152*  K 4.3 4.1 4.4  CL 118* 119* 114*  CO2 28 28 27   BUN 53* 55* 61*  CREATININE 1.81* 1.95* 2.01*  GLUCOSE 265* 209* 287*    Recent Labs Lab 07/04/12 0400 07/05/12 0500  07/06/12 0545  HGB 11.7* 11.8* 11.5*  HCT 38.5* 40.1 39.4  WBC 17.3* 14.9* 13.7*  PLT 298 313 296   CBG (last 3)   Recent Labs  07/05/12 2340 07/06/12 0349 07/06/12 0805  GLUCAP 223* 233* 237*   Imaging: Dg Chest Port 1 View  07/06/2012   *RADIOLOGY REPORT*  Clinical Data: ET tube.  PORTABLE CHEST - 1 VIEW  Comparison: 07/05/2012  Findings: Support devices are in stable position.  Areas of atelectasis bilaterally.  Heart is normal size.  No effusions.  No acute bony abnormality.  IMPRESSION: Areas of subsegmental atelectasis.  No change.   Original Report Authenticated By: Charlett Nose, M.D.   Dg Chest Port 1 View  07/05/2012    *RADIOLOGY REPORT*  Clinical Data: ET tube placement.  PORTABLE CHEST - 1 VIEW  Comparison: 07/03/2012.  Findings: Support devices are in stable position.  Heart is normal size.  Areas of atelectasis bilaterally, improved.  No effusions. No acute bony abnormality.  IMPRESSION: Bibasilar atelectasis, improving.   Original Report Authenticated By: Charlett Nose, M.D.   ASSESSMENT / PLAN:  NEUROLOGIC A:   CVA c/b ICH s/p TPA-->ICH 6/15, [facility] 6/17 Cerebral edema >> off 3% NS P:   - Dilantin for seizure prevention. - D/C sedation for terminal extubation, if deteriorates, will start morphine for comfort.  PULMONARY A: Acute respiratory failure in setting of acute encephalopathy d/t ICH Difficult airway: required glide scope and bougie/ cords swollen required #7 P:   - One way extubation today. - D/C further CXR.  CARDIOVASCULAR A:  HTN crisis NSTEMI -peak Tp 11 P:  - Hold PO meds post extubation. - No ASA due to ICH.  RENAL A:   Acute renal failure >> improving Hypokalemia Hypernatremia  P:   - D/C free water, lasix and further blood draws at this point.  GASTROINTESTINAL A:  Nutrition. Ileus >> improved. P:   - D/C TF, if able to breath and protect airway post extubation will consider swallow but highly doubtful will pass, not following any commands.  HEMATOLOGIC A:  Coagulopathy due to tPA, resolved  P:  - D/C further blood draws. - SCDs.  INFECTIOUS A: R basilar PNA > MSSA P:   - D10/10 Abx, will d/c.  ENDOCRINE A:  Minimal hyperglycemia  P:   - D/C further CBG post extubation if fails. - SSI if needed.  Summary: Prognosis for neuro recover poor.  Family ready for one way extubation, will extubate, if starts to have difficulty will start morphine and switch to comfort care.  CC time 35 min.  Alyson Reedy, M.D. [facility] Pulmonary/Critical Care Medicine. Pager: [phone]. After hours pager: [phone].

## 2012-07-06 NOTE — Progress Notes (Signed)
Stroke Team Progress Note  HISTORY [name] is an 57 y.o. 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 No focal weakness was noted. Patient has been taking aspirin 81 mg per day. CT scan showed no acute intracranial abnormality. NIH stroke score was 8. Patient had marked expressive aphasia as well as gaze preference to the left side and right visual field defect to visual confrontation. He was deemed a candidate for intravenous thrombolytic therapy with TPA.  LSN: [id number] on 06/21/2012  tPA Given: Yes  MRankin: 2  SUBJECTIVE. Remains intubated. No change. DNR. Patients family deciding on terminal wean today.  OBJECTIVE Most recent Vital Signs: Filed Vitals:   07/06/12 0500 07/06/12 0600 07/06/12 0700 07/06/12 0745  BP: 179/100 129/80 146/80 146/80  Pulse: 85 77 78   Temp: 99.1 F (37.3 C) 99.7 F (37.6 C) 100 F (37.8 C)   TempSrc:      Resp: 23 35 26   Height:      Weight: 68.8 kg (151 lb 10.8 oz)     SpO2: 100% 99% 99%    CBG (last 3)   Recent Labs  07/05/12 1939 07/05/12 2340 07/06/12 0349  GLUCAP 219* 223* 233*    IV Fluid Intake:   . sodium chloride 20 mL/hr at 07/06/12 0600  . feeding supplement (JEVITY 1.2 CAL) 1,000 mL (07/06/12 0007)  . niCARDipine Stopped (07/04/12 0915)    MEDICATIONS  . amLODipine  10 mg Per Tube Daily  . antiseptic oral rinse  15 mL Mouth Rinse QID  . atorvastatin  20 mg Oral q1800  .  ceFAZolin (ANCEF) IV  2 g Intravenous Q8H  . chlorhexidine  15 mL Mouth Rinse BID  . cloNIDine  0.1 mg Oral BID WC  . feeding supplement  30 mL Per Tube TID  . free water  250 mL Per Tube Q6H  . insulin aspart  0-9 Units Subcutaneous Q4H  . insulin  aspart  3 Units Subcutaneous Q4H  . metoprolol tartrate  25 mg Oral Q6H  . pantoprazole sodium  40 mg Per Tube Q24H  . phenytoin  100 mg Per Tube TID  . potassium chloride  40 mEq Oral BID  . sodium chloride  10-40 mL Intracatheter Q12H   PRN:  acetaminophen, acetaminophen, artificial tears, fentaNYL, labetalol, LORazepam, metoprolol, midazolam, senna-docusate  Diet:  NPO tube feeds  Activity:  Bedrest DVT Prophylaxis:  SCD  CLINICALLY SIGNIFICANT STUDIES Basic Metabolic Panel:   Recent Labs Lab 07/05/12 0500 07/06/12 0545  NA 157* 152*  K 4.1 4.4  CL 119* 114*  CO2 28 27  GLUCOSE 209* 287*  BUN 55* 61*  CREATININE 1.95* 2.01*  CALCIUM 9.0 8.7  MG 2.5 2.6*  PHOS 4.1 4.6   Liver Function Tests:  No results found for this basename: AST, ALT, ALKPHOS, BILITOT, PROT, ALBUMIN,  in the last 168 hours CBC:   Recent Labs Lab 07/05/12 0500 07/06/12 0545  WBC 14.9* 13.7*  HGB 11.8* 11.5*  HCT 40.1 39.4  MCV 66.6* 67.0*  PLT 313 296   Coagulation:  No results found for this basename: LABPROT, INR,  in the last 168 hours Cardiac Enzymes:  No results found for this basename: CKTOTAL, CKMB, CKMBINDEX, TROPONINI,  in the last 168  hours Urinalysis: No results found for this basename: COLORURINE, APPERANCEUR, LABSPEC, PHURINE, GLUCOSEU, HGBUR, BILIRUBINUR, KETONESUR, PROTEINUR, UROBILINOGEN, NITRITE, LEUKOCYTESUR,  in the last 168 hours Lipid Panel    Component Value Date/Time   CHOL 185 06/22/2012 0525   TRIG 294* 06/28/2012 0600   HDL 58 06/22/2012 0525   CHOLHDL 3.2 06/22/2012 0525   VLDL 18 06/22/2012 0525   LDLCALC 109* 06/22/2012 0525   HgbA1C  Lab Results  Component Value Date   HGBA1C 6.2* 06/24/2012   Urine Drug Screen:   No results found for this basename: labopia,  cocainscrnur,  labbenz,  amphetmu,  thcu,  labbarb    Alcohol Level: No results found for this basename: ETH,  in the last 168 hours  Ct Head Wo Contrast 07/03/2012 No significant change. Blood in  the occipital horn of the right lateral ventricle is more apparent. Midline shift is essentially unchanged. No new hemorrhage on the left. 06/26/2012    Overall slight improvement in the degree of midline shift compared with priors.  No new areas of infarction.  No convincing signs for trapped right lateral ventricle on today's exam.  Stable multifocal predominantly left MCA territory infarcts with hemorrhagic transformation; no new infarcts or new lobar hemorrhage from priors.    06/24/2012    Large left parietal - frontal - posterior left temporal lobe hemorrhagic infarct and mass effect upon the left lateral ventricle relatively similar to the recent exam.  Interval development of interventricular blood within the tract right lateral ventricle.  Subarachnoid blood most notable right convexity and right sylvian fissure more apparent than on the prior exam.  Bilateral anterior frontal lobe infarcts greater on the left with minimal associated petechial hemorrhage.   06/23/2012   Minimal change in the large left hemorrhagic infarct.  Minimal change in the midline shift.    06/22/2012    Image quality degraded by significant motion.  There is a large area of hemorrhage in the left parietal lobe with mass effect and 10 mm midline shift.  This is most consistent with hemorrhagic infarction following TPA.    06/22/2012   Ct Portable Head W/o Cm Large area of hemorrhagic infarction left parietal lobe is similar. There is improvement in blood in the corpus collosum/ caudate on the left.  8 mm midline shift to the right without hydrocephalus.   06/21/2012    Chronic ischemic changes, right greater than left.  No acute abnormality.    MRI of the brain    MRA of the brain    2D Echocardiogram  ejection fraction was in the range of 60% to 65%. Possible mild hypokinesis of the apical myocardium. - Aortic valve: Trivial regurgitation. - Mitral valve: Moderate regurgitation. - Left atrium: The atrium was mildly  dilated. - Right ventricle: There was moderate hypertrophy. - Right atrium: The atrium was mildly dilated. - Pulmonary arteries: Systolic pressure was mildly  Carotid Doppler  Technically limited due to right IJ line and patient immobility. No evidence of significant ICA stenosis noted. Antegrade vertebral flow.  CXR   07/03/2012 Bibasilar atelectasis. 07/01/2012   Improved aeration bilaterally.    06/27/2012    Improved bibasilar aeration.  06/26/2012   1.  Low inspiratory volumes with new patchy right basilar opacity. Differential considerations include developing infiltrate, asymmetric pulmonary edema, and potentially atelectasis although this is less likely given the imaging appearance. 2.  Stable left retrocardiac opacity may reflect atelectasis or infiltrate. 3.  Pulmonary vascular congestion bordering on mild interstitial edema.  06/24/2012   Stable cardiomegaly.  No acute findings.    06/23/2012  1.  Nasogastric tube placement least as far as the stomach. 2.  Interval increase in bilateral edema or infiltrates.   06/22/2012  Tip of right jugular line projects over SVC without pneumothorax. Bibasilar atelectasis.   Original Report Authenticated By: Ulyses Southward, M.D.  06/21/2012   Moderate cardiomegaly.  No active disease.   EKG  normal sinus rhythm.   EEG no epileptiform activity  Therapy Recommendations    Physical Exam  General: The patient is intubated.  Neurologic Exam  Mental Status:  Eyes closed. Opens eyes to stim, no verbal. Not following commands.  Cranial nerves: Facial symmetry is present. The patient has a gaze preference to the left.  The eyes cannot be dolled across midline. Pupils are equal and reactive.  Motor: MINIMAL SPONT MOVEMENT IN LUE.  WITHDRAWAL IN LLE. RUE AND RLE 0/5.   Reflexes: SLIGHTLY BRISK IN LUE AND LLE.    ASSESSMENT [name] is a 57 y.o. male presenting with expressive aphasia with unintelligible speech output. He is status post IV  t-PA 06/21/2012 at 20:17. Symptoms were consistent with left middle cerebral artery acute stroke.  Imaging confirmed a large left parietal - frontal - posterior left temporal lobe ischemic infarct with old right frontal infarct.  Followup CT repeated due to acute delirium, tonic-clonic seizure activity in left arm, decreasing consciousness. That scan 06/22/2012 shows that there is a large 6 cm  area of hemorrhagic transformation in the left parietal lobe with cerebral edema - mass effect upon the left lateral ventricle and 10 mm midline shift.  Patient was subsequently intubated for airway protection. 3% saline started to decrease cytotoxic edema. F/u CT shows interval development of interventricular blood within the tract right lateral ventricle and subarachnoid blood most notable right convexity and right sylvian fissure  Initial infarcts felt to be embolic, source most likely cardioembolic from acute MI. On no antithrombotics prior to admission. Now on no antithrombitics due to hemorrhage.  Patient with resultant VDRF, global aphasia, right hemiparesis, dysphagia.   Hypernatremia, induced with 3% Saline to decrease cerebral edema, currently on hold,  sodium remains elevated at 156  Hyperlipidemia, LDL 109, not on statin prior to admission, on atorvastatin now  Chronic kidney disease, stage 3  Malignant Hypertension, on admission, changing to medications and weaning off cardene with SBP goal < 160  Possible NSTEMI with Troponin I normal on admission, elevation to 11.15 [facility] D[facility], [facility] consulted, not a candidate for cardiac intervention until stabilizes.   Tachycardia, resolved. Placed On lopressor 100mg  total daily.   Seizure-like activity with hemorrhage, EEG without epileptiform activity. On dilantin.  Right basilar pneumonia, MSSA, treated with ancef  Hospital day # 15  TREATMENT/PLAN  SBP goal now < 180, (157/96) with changing to po medication, norvasc increased and added  to beta-blocker regimen. BP >180/110. Clonidine added.  Patient is now DNR, he will extubated today and family has so far agreed to let nature take its course after discussion yesterday with CCM.   Gwendolyn Lima. Manson Passey, [facility], MBA, MHA [facility] Pager: [phone] 07/06/2012 7:49 AM   I evaluated and examined patient, reviewed records, labs and imaging, and agree with note and plan. Patient status post extubation now. DNR status. Comfort measures going forward.  Suanne Marker, MD 07/06/2012, 6:28 PM Certified in Neurology, Neurophysiology and Neuroimaging [facility] - Stroke Team  Please refer to [email or web address] for on-call Stroke MD

## 2012-07-06 NOTE — Progress Notes (Deleted)
Orders state keep patient temp. <99.5 and to control with tylenol. No tylenol orders. [facility] doctor paged and made aware. Tylenol order received. Will continue to monitor. [place], Rodriguez Aguinaldo M     

## 2012-07-06 NOTE — Plan of Care (Signed)
Called pharmacy back and spoke to pharmacists.  Pharmacist needs orders from MD to update pt's medications.  Called [facility] and left message with nurse for MD to look at pt's medications so medication orders can be updated/changed from PO to IV due to pt's OG being discontinued.

## 2012-07-07 LAB — GLUCOSE, CAPILLARY
Glucose-Capillary: 128 mg/dL — ABNORMAL HIGH (ref 70–99)
Glucose-Capillary: 148 mg/dL — ABNORMAL HIGH (ref 70–99)

## 2012-07-07 NOTE — Progress Notes (Signed)
PULMONARY  / CRITICAL CARE MEDICINE  Name: [name] MRN: [id number] DOB: December 22, 1955    ADMISSION DATE:  06/21/2012 CONSULTATION DATE:  6/13  REFERRING MD :  sethi  PRIMARY SERVICE: stroke team   CHIEF COMPLAINT:  Asked to see for ventilator mx and supportive critical care services   BRIEF PATIENT DESCRIPTION:  This is 57 year old male admitted 6/12 w/ Left posterior MCA infarct. Got TPA. Found to have AMS the am of 6/13, CT showed new left ICH w/ midline shift. PCCM asked to see for airway support, IV access and supportive critical care.   SIGNIFICANT EVENTS / STUDIES:  CT head 6/12: Chronic ischemic changes, right greater than left. No acute abnormality CT head 6/13: Image quality degraded by significant motion. There is a large area of hemorrhage in the left parietal lobe with mass effect and 10 mm midline shift. This is most consistent with  hemorrhagic infarction following TPA. ECHO 6/14: EF 60-65%, mild apical hypokinesis CT head 6/15: Large left parietal - frontal - posterior left temporal lobe hemorrhagic infarct and mass effect upon the left lateral ventricle relatively similar to the recent exam. Interval development of interventricular blood within the tract right lateral ventricle. EEG 6/19: general slowing, no seizure activity that correlates w tremor CT head 6/24: No change in blood in occipital horn of Rt lateral ventricle with midline shift  LINES / TUBES: OETT 6/13 (DIFFICULT AIRWAY, Cords swollen, glide scope, #7, very deep)>>>6/27 Right IJ 6/13>>> Right radial A-line 6/13 >>> out  CULTURES: Sputum 6/15>>> s aureus >> MSSA  ANTIBIOTICS: vanco 6/17 (s aureus sputum) >> 6/18 Ancef 6/18 (MSSA) >> 6/27  SUBJECTIVE:  Extubated 6/27, sats good on 1l/m of o2  Alert , does not follow commands Family meeting 6/27 >Prognosis for neuro recover poor. DNR w/ one way extubation,  if starts to have difficulty will start morphine and switch to comfort care.   VITAL  SIGNS: Temp:  [98.6 F (37 C)-100.6 F (38.1 C)] 99.5 F (37.5 C) (06/28 0600) Pulse Rate:  [72-102] 83 (06/28 0600) Resp:  [18-33] 30 (06/28 0600) BP: (144-188)/(81-116) 152/89 mmHg (06/28 0600) SpO2:  [99 %-100 %] 99 % (06/28 0600) FiO2 (%):  [30 %] 30 % (06/27 0900) Weight:  [69.5 kg (153 lb 3.5 oz)] 69.5 kg (153 lb 3.5 oz) (06/28 0439) VENTILATOR SETTINGS: Vent Mode:  [-]  FiO2 (%):  [30 %] 30 % INTAKE / OUTPUT: Intake/Output     06/27 0701 - 06/28 0700 06/28 0701 - 06/29 0700   I.V. (mL/kg) 488 (7)    Other     NG/GT 180    IV Piggyback     Total Intake(mL/kg) 668 (9.6)    Urine (mL/kg/hr) 2245 (1.3)    Stool     Total Output 2245     Net -1577          Stool Occurrence 1 x      PHYSICAL EXAMINATION: General: No distress,  Neuro:  Open's eyes spontaneously, not tracking, not following commands, moves LUE, w/d to pain on LLE  HEENT: dry mucosa  Cardiovascular:  regular Lungs: no wheeze Abdomen: soft, non tender, good bowel sounds  Musculoskeletal: no edema Skin:  Intact   LABS:  Recent Labs Lab 07/04/12 0400 07/05/12 0500 07/06/12 0545  NA 156* 157* 152*  K 4.3 4.1 4.4  CL 118* 119* 114*  CO2 28 28 27   BUN 53* 55* 61*  CREATININE 1.81* 1.95* 2.01*  GLUCOSE 265* 209* 287*  Recent Labs Lab 07/04/12 0400 07/05/12 0500 07/06/12 0545  HGB 11.7* 11.8* 11.5*  HCT 38.5* 40.1 39.4  WBC 17.3* 14.9* 13.7*  PLT 298 313 296   CBG (last 3)   Recent Labs  07/06/12 2341 07/07/12 0358 07/07/12 0752  GLUCAP 134* 143* 128*   Imaging: Dg Chest Port 1 View  07/06/2012   *RADIOLOGY REPORT*  Clinical Data: ET tube.  PORTABLE CHEST - 1 VIEW  Comparison: 07/05/2012  Findings: Support devices are in stable position.  Areas of atelectasis bilaterally.  Heart is normal size.  No effusions.  No acute bony abnormality.  IMPRESSION: Areas of subsegmental atelectasis.  No change.   Original Report Authenticated By: Charlett Nose, M.D.   ASSESSMENT /  PLAN:  NEUROLOGIC A:   CVA c/b ICH s/p TPA-->ICH 6/15, [facility] 6/17 Cerebral edema >> off 3% NS P:   - Dilantin for seizure prevention. -Per neuro   PULMONARY A: Acute respiratory failure in setting of acute encephalopathy d/t ICH Difficult airway: required glide scope and bougie/ cords swollen required #7 >extubated 6/27, sats adequate on 1l/m O2  P:   - titrate O2 for sats >90% -   CARDIOVASCULAR A:  HTN crisis NSTEMI -peak Tp 11 P:  - cont metoprolol  And clonidine  - No ASA due to ICH. -d/c CVL once PIV placed   RENAL A:   Acute renal failure >> improving Hypokalemia Hypernatremia  P:   - D/C free water, lasix and further blood draws at this point. -d/c foley   GASTROINTESTINAL A:  Nutrition. Ileus >> improved. P:   - off TF, if able to breath and protect airway post extubation will consider swallow but highly doubtful will pass, not following any commands.  HEMATOLOGIC A:  Coagulopathy due to tPA, resolved  P:  - D/C further blood draws. - SCDs.  INFECTIOUS A: R basilar PNA > MSSA >finished 10 course of Abx 6/27  P:   - monitor   ENDOCRINE A:  Minimal hyperglycemia  P:   - D/C further CBG post extubation if fails. - SSI if needed.  Summary: Prognosis for neuro recover poor.  Family ready for one way extubation, will extubate, if starts to have difficulty will start morphine and switch to comfort care.   PARRETT,TAMMY NP  PCCM  [phone]   I have interviewed and examined the patient and reviewed the database. I have formulated the assessment and plan as reflected in the note above with amendments made by me.  Billy Fischer, MD;  PCCM service; Mobile [phone]

## 2012-07-07 NOTE — Progress Notes (Signed)
Stroke Team Progress Note  HISTORY [name] is an 57 y.o. 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 No focal weakness was noted. Patient has been taking aspirin 81 mg per day. CT scan showed no acute intracranial abnormality. NIH stroke score was 8. Patient had marked expressive aphasia as well as gaze preference to the left side and right visual field defect to visual confrontation. He was deemed a candidate for intravenous thrombolytic therapy with TPA.  LSN: [id number] on 06/21/2012  tPA Given: Yes  MRankin: 2  SUBJECTIVE. Patient successfully extubated 07/06/12. No change. DNR.  OBJECTIVE Most recent Vital Signs: Filed Vitals:   07/07/12 0600 07/07/12 0700 07/07/12 0800 07/07/12 0900  BP: 152/89 165/99 174/104 169/107  Pulse: 83 93 99 97  Temp: 99.5 F (37.5 C) 99.5 F (37.5 C) 99.9 F (37.7 C)   TempSrc:   Core (Comment)   Resp: 30 34 30 28  Height:      Weight:      SpO2: 99% 99% 99% 99%   CBG (last 3)   Recent Labs  07/06/12 2341 07/07/12 0358 07/07/12 0752  GLUCAP 134* 143* 128*    IV Fluid Intake:   . sodium chloride 20 mL/hr at 07/07/12 0000  . niCARDipine Stopped (07/04/12 0915)    MEDICATIONS  . antiseptic oral rinse  15 mL Mouth Rinse QID  . chlorhexidine  15 mL Mouth Rinse BID  . cloNIDine  0.2 mg Transdermal Weekly  . insulin aspart  0-9 Units Subcutaneous Q4H  . metoprolol  2.5 mg Intravenous Q6H  . phenytoin (DILANTIN) IV  100 mg Intravenous Q8H  . sodium chloride  10-40 mL Intracatheter Q12H   PRN:  acetaminophen, acetaminophen, artificial tears, fentaNYL, labetalol, LORazepam, metoprolol  Diet:  NPO tube feeds  Activity:  Bedrest DVT  Prophylaxis:  SCD  CLINICALLY SIGNIFICANT STUDIES Basic Metabolic Panel:   Recent Labs Lab 07/05/12 0500 07/06/12 0545  NA 157* 152*  K 4.1 4.4  CL 119* 114*  CO2 28 27  GLUCOSE 209* 287*  BUN 55* 61*  CREATININE 1.95* 2.01*  CALCIUM 9.0 8.7  MG 2.5 2.6*  PHOS 4.1 4.6   Liver Function Tests:  No results found for this basename: AST, ALT, ALKPHOS, BILITOT, PROT, ALBUMIN,  in the last 168 hours CBC:   Recent Labs Lab 07/05/12 0500 07/06/12 0545  WBC 14.9* 13.7*  HGB 11.8* 11.5*  HCT 40.1 39.4  MCV 66.6* 67.0*  PLT 313 296   Coagulation:  No results found for this basename: LABPROT, INR,  in the last 168 hours Cardiac Enzymes:  No results found for this basename: CKTOTAL, CKMB, CKMBINDEX, TROPONINI,  in the last 168 hours Urinalysis: No results found for this basename: COLORURINE, APPERANCEUR, LABSPEC, PHURINE, GLUCOSEU, HGBUR, BILIRUBINUR, KETONESUR, PROTEINUR, UROBILINOGEN, NITRITE, LEUKOCYTESUR,  in the last 168 hours Lipid Panel    Component Value Date/Time   CHOL 185 06/22/2012 0525   TRIG 294* 06/28/2012 0600   HDL 58 06/22/2012 0525   CHOLHDL 3.2 06/22/2012 0525   VLDL 18 06/22/2012 0525   LDLCALC 109* 06/22/2012 0525   HgbA1C  Lab Results  Component Value Date   HGBA1C 6.2* 06/24/2012   Urine Drug Screen:   No results found for this basename: labopia,  cocainscrnur,  labbenz,  amphetmu,  thcu,  labbarb    Alcohol Level: No results found for this basename: ETH,  in the last 168 hours  Ct Head Wo Contrast 07/03/2012 No significant change. Blood in the occipital horn of the right lateral ventricle is more apparent. Midline shift is essentially unchanged. No new hemorrhage on the left. 06/26/2012    Overall slight improvement in the degree of midline shift compared with priors.  No new areas of infarction.  No convincing signs for trapped right lateral ventricle on today's exam.  Stable multifocal predominantly left MCA territory infarcts with hemorrhagic  transformation; no new infarcts or new lobar hemorrhage from priors.    06/24/2012    Large left parietal - frontal - posterior left temporal lobe hemorrhagic infarct and mass effect upon the left lateral ventricle relatively similar to the recent exam.  Interval development of interventricular blood within the tract right lateral ventricle.  Subarachnoid blood most notable right convexity and right sylvian fissure more apparent than on the prior exam.  Bilateral anterior frontal lobe infarcts greater on the left with minimal associated petechial hemorrhage.   06/23/2012   Minimal change in the large left hemorrhagic infarct.  Minimal change in the midline shift.    06/22/2012    Image quality degraded by significant motion.  There is a large area of hemorrhage in the left parietal lobe with mass effect and 10 mm midline shift.  This is most consistent with hemorrhagic infarction following TPA.    06/22/2012   Ct Portable Head W/o Cm Large area of hemorrhagic infarction left parietal lobe is similar. There is improvement in blood in the corpus collosum/ caudate on the left.  8 mm midline shift to the right without hydrocephalus.   06/21/2012    Chronic ischemic changes, right greater than left.  No acute abnormality.    MRI of the brain    MRA of the brain    2D Echocardiogram  ejection fraction was in the range of 60% to 65%. Possible mild hypokinesis of the apical myocardium. - Aortic valve: Trivial regurgitation. - Mitral valve: Moderate regurgitation. - Left atrium: The atrium was mildly dilated. - Right ventricle: There was moderate hypertrophy. - Right atrium: The atrium was mildly dilated. - Pulmonary arteries: Systolic pressure was mildly  Carotid Doppler  Technically limited due to right IJ line and patient immobility. No evidence of significant ICA stenosis noted. Antegrade vertebral flow.  CXR   07/03/2012 Bibasilar atelectasis. 07/01/2012   Improved aeration bilaterally.    06/27/2012     Improved bibasilar aeration.  06/26/2012   1.  Low inspiratory volumes with new patchy right basilar opacity. Differential considerations include developing infiltrate, asymmetric pulmonary edema, and potentially atelectasis although this is less likely given the imaging appearance. 2.  Stable left retrocardiac opacity may reflect atelectasis or infiltrate. 3.  Pulmonary vascular congestion bordering on mild interstitial edema.    06/24/2012   Stable cardiomegaly.  No acute findings.    06/23/2012  1.  Nasogastric tube placement least as far as the stomach. 2.  Interval increase in bilateral edema or infiltrates.   06/22/2012  Tip of right jugular line projects over SVC without pneumothorax. Bibasilar atelectasis.   Original Report Authenticated By: Ulyses Southward, M.D.  06/21/2012   Moderate cardiomegaly.  No active disease.   EKG  normal sinus rhythm.   EEG no epileptiform activity  Therapy Recommendations    Physical Exam  General: The patient is resting quietly, no  obvious distress.  Neurologic Exam  Mental Status: alert and awake. Opens eyes spontaneously. Not following commands.  Cranial nerves: pupils measure 4 mm bilaterally, reactive to light.The patient has a gaze preference to the left.  The eyes cannot be dolled across midline. Right face seems to be weaker than right.  Motor: MINIMAL SPONT MOVEMENT IN LUE.  WITHDRAWAL IN LLE. RUE AND RLE 0/5.   Reflexes: SLIGHTLY BRISK IN LUE AND LLE.   Coordination and gait: unable to test. ASSESSMENT [name] is a 57 y.o. male presenting with expressive aphasia with unintelligible speech output. He is status post IV t-PA 06/21/2012 at 20:17. Symptoms were consistent with left middle cerebral artery acute stroke.  Imaging confirmed a large left parietal - frontal - posterior left temporal lobe ischemic infarct with old right frontal infarct.  Followup CT repeated due to acute delirium, tonic-clonic seizure activity in left arm, decreasing  consciousness. That scan 06/22/2012 shows that there is a large 6 cm  area of hemorrhagic transformation in the left parietal lobe with cerebral edema - mass effect upon the left lateral ventricle and 10 mm midline shift.  Patient was subsequently intubated for airway protection. 3% saline started to decrease cytotoxic edema. F/u CT shows interval development of interventricular blood within the tract right lateral ventricle and subarachnoid blood most notable right convexity and right sylvian fissure  Initial infarcts felt to be embolic, source most likely cardioembolic from acute MI. On no antithrombotics prior to admission. Now on no antithrombitics due to hemorrhage.  Patient with resultant VDRF, global aphasia, right hemiparesis, dysphagia.   Hypernatremia, induced with 3% Saline to decrease cerebral edema, currently on hold,  sodium remains elevated at 156  Hyperlipidemia, LDL 109, not on statin prior to admission, on atorvastatin now  Chronic kidney disease, stage 3  Malignant Hypertension, on admission, changing to medications and weaning off cardene with SBP goal < 160  Possible NSTEMI with Troponin I normal on admission, [facility] consulted, not a candidate for cardiac intervention until stabilizes.   Tachycardia, resolved. Placed On lopressor 100mg  total daily.   Seizure-like activity with hemorrhage, EEG without epileptiform activity. On dilantin.  Right basilar pneumonia, MSSA, treated with ancef  Hospital day # 16  TREATMENT/PLAN  SBP goal now < 180, (157/96) with changing to po medication, norvasc increased and added to beta-blocker regimen. BP >180/110. Clonidine added. Patient is now extubated, DNR.  Continue current management. Critical care team help very much appreciated. No new neurological or medical issues at this moment, and thus will transfer patient to [facility] [place] today. Will continue to follow.   Wyatt Portela, MD [facility] - Stroke Team 07/07/2012 10:17 AM

## 2012-07-08 ENCOUNTER — Inpatient Hospital Stay (HOSPITAL_COMMUNITY): Payer: Medicare Other

## 2012-07-08 LAB — COMPREHENSIVE METABOLIC PANEL
ALT: 138 U/L — ABNORMAL HIGH (ref 0–53)
AST: 338 U/L — ABNORMAL HIGH (ref 0–37)
Albumin: 2.3 g/dL — ABNORMAL LOW (ref 3.5–5.2)
Alkaline Phosphatase: 135 U/L — ABNORMAL HIGH (ref 39–117)
BUN: 73 mg/dL — ABNORMAL HIGH (ref 6–23)
CO2: 23 mEq/L (ref 19–32)
Calcium: 9.1 mg/dL (ref 8.4–10.5)
Chloride: 123 mEq/L — ABNORMAL HIGH (ref 96–112)
Creatinine, Ser: 2.47 mg/dL — ABNORMAL HIGH (ref 0.50–1.35)
GFR calc Af Amer: 32 mL/min — ABNORMAL LOW (ref >90)
GFR calc non Af Amer: 27 mL/min — ABNORMAL LOW (ref >90)
Glucose, Bld: 159 mg/dL — ABNORMAL HIGH (ref 70–99)
Potassium: 4.7 mEq/L (ref 3.5–5.1)
Sodium: 160 mEq/L — ABNORMAL HIGH (ref 135–145)
Total Bilirubin: 0.4 mg/dL (ref 0.3–1.2)
Total Protein: 8.5 g/dL — ABNORMAL HIGH (ref 6.0–8.3)

## 2012-07-08 LAB — URINALYSIS, ROUTINE W REFLEX MICROSCOPIC
Glucose, UA: NEGATIVE mg/dL
Protein, ur: >300 mg/dL — AB
Specific Gravity, Urine: 1.025 (ref 1.005–1.030)
Urobilinogen, UA: 0.2 mg/dL (ref 0.0–1.0)

## 2012-07-08 LAB — GLUCOSE, CAPILLARY
Glucose-Capillary: 161 mg/dL — ABNORMAL HIGH (ref 70–99)
Glucose-Capillary: 163 mg/dL — ABNORMAL HIGH (ref 70–99)
Glucose-Capillary: 169 mg/dL — ABNORMAL HIGH (ref 70–99)

## 2012-07-08 LAB — URINE MICROSCOPIC-ADD ON

## 2012-07-08 LAB — CBC WITH DIFFERENTIAL/PLATELET
Basophils Absolute: 0 10*3/uL (ref 0.0–0.1)
Eosinophils Absolute: 0 10*3/uL (ref 0.0–0.7)
Eosinophils Relative: 0 % (ref 0–5)
Lymphs Abs: 1.7 10*3/uL (ref 0.7–4.0)
MCH: 20.3 pg — ABNORMAL LOW (ref 26.0–34.0)
MCHC: 29.6 g/dL — ABNORMAL LOW (ref 30.0–36.0)
Monocytes Absolute: 0.8 10*3/uL (ref 0.1–1.0)
Neutrophils Relative %: 88 % — ABNORMAL HIGH (ref 43–77)
Platelets: 323 10*3/uL (ref 150–400)
RBC: 6.6 MIL/uL — ABNORMAL HIGH (ref 4.22–5.81)
RDW: 23.1 % — ABNORMAL HIGH (ref 11.5–15.5)

## 2012-07-08 LAB — PHENYTOIN LEVEL, TOTAL: Phenytoin Lvl: 4.4 ug/mL — ABNORMAL LOW (ref 10.0–20.0)

## 2012-07-08 MED ORDER — FREE WATER: 250.0000 mL | Freq: Three times a day (TID) | Status: DC

## 2012-07-08 MED ORDER — FREE WATER
300.0000 mL | Freq: Three times a day (TID) | Status: DC
  Administered 2012-07-08 – 2012-07-09 (×2): 300 mL

## 2012-07-08 MED ORDER — JEVITY 1.2 CAL PO LIQD
1000.0000 mL | ORAL | Status: DC
  Administered 2012-07-08: 1000 mL
  Administered 2012-07-09: 21:00:00
  Filled 2012-07-08 (×5): qty 1000

## 2012-07-08 NOTE — Progress Notes (Signed)
Stroke Team Progress Note  HISTORY [name] is a 57 y.o. 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 No focal weakness was noted. The  patient had been taking aspirin 81 mg per day. CT scan showed no acute intracranial abnormality. NIH stroke score was 8. Patient had marked expressive aphasia as well as gaze preference to the left side and right visual field defect to visual confrontation. He was deemed a candidate for intravenous thrombolytic therapy with TPA.   LSN: [id number] on 06/21/2012  tPA Given: Yes  MRankin: 2  SUBJECTIVE. Patient successfully extubated 07/06/12. DNR. No family members in the room at this time. The patient is unable to communicate.  OBJECTIVE Most recent Vital Signs: Filed Vitals:   07/08/12 0221 07/08/12 0500 07/08/12 0600 07/08/12 0653  BP: 157/86  174/99 142/84  Pulse: 101  102 87  Temp:   100.1 F (37.8 C) 99.2 F (37.3 C)  TempSrc: Axillary  Axillary Axillary  Resp: 18  18   Height:      Weight:  69.718 kg (153 lb 11.2 oz)    SpO2: 94%  97%    CBG (last 3)   Recent Labs  07/07/12 2030 07/08/12 0017 07/08/12 0418  GLUCAP 156* 173* 149*    IV Fluid Intake:   . sodium chloride 20 mL/hr at 07/07/12 0000  . niCARDipine Stopped (07/04/12 0915)    MEDICATIONS  . antiseptic oral rinse  15 mL Mouth Rinse QID  . chlorhexidine  15 mL Mouth Rinse BID  . cloNIDine  0.2 mg Transdermal Weekly  . insulin aspart  0-9 Units Subcutaneous Q4H  . metoprolol  2.5 mg Intravenous Q6H  . phenytoin (DILANTIN) IV  100 mg Intravenous Q8H  . sodium chloride  10-40 mL Intracatheter Q12H   PRN:  acetaminophen, acetaminophen, artificial tears, fentaNYL, labetalol,  LORazepam, metoprolol  Diet:  NPO tube feeds  Activity:  Bedrest DVT Prophylaxis:  SCD  CLINICALLY SIGNIFICANT STUDIES Basic Metabolic Panel:   Recent Labs Lab 07/05/12 0500 07/06/12 0545  NA 157* 152*  K 4.1 4.4  CL 119* 114*  CO2 28 27  GLUCOSE 209* 287*  BUN 55* 61*  CREATININE 1.95* 2.01*  CALCIUM 9.0 8.7  MG 2.5 2.6*  PHOS 4.1 4.6   Liver Function Tests:  No results found for this basename: AST, ALT, ALKPHOS, BILITOT, PROT, ALBUMIN,  in the last 168 hours CBC:   Recent Labs Lab 07/05/12 0500 07/06/12 0545  WBC 14.9* 13.7*  HGB 11.8* 11.5*  HCT 40.1 39.4  MCV 66.6* 67.0*  PLT 313 296   Coagulation:  No results found for this basename: LABPROT, INR,  in the last 168 hours Cardiac Enzymes:  No results found for this basename: CKTOTAL, CKMB, CKMBINDEX, TROPONINI,  in the last 168 hours Urinalysis: No results found for this basename: COLORURINE, APPERANCEUR, LABSPEC, PHURINE, GLUCOSEU, HGBUR, BILIRUBINUR, KETONESUR, PROTEINUR, UROBILINOGEN, NITRITE, LEUKOCYTESUR,  in the last 168 hours Lipid Panel    Component Value Date/Time   CHOL 185 06/22/2012 0525   TRIG 294* 06/28/2012 0600   HDL 58 06/22/2012 0525   CHOLHDL 3.2 06/22/2012 0525   VLDL 18 06/22/2012 0525   LDLCALC 109* 06/22/2012 0525   HgbA1C  Lab Results  Component Value Date  HGBA1C 6.2* 06/24/2012   Urine Drug Screen:   No results found for this basename: labopia,  cocainscrnur,  labbenz,  amphetmu,  thcu,  labbarb    Alcohol Level: No results found for this basename: ETH,  in the last 168 hours  Ct Head Wo Contrast 07/03/2012 No significant change. Blood in the occipital horn of the right lateral ventricle is more apparent. Midline shift is essentially unchanged. No new hemorrhage on the left.  06/26/2012    Overall slight improvement in the degree of midline shift compared with priors.  No new areas of infarction.  No convincing signs for trapped right lateral ventricle on today's exam.  Stable  multifocal predominantly left MCA territory infarcts with hemorrhagic transformation; no new infarcts or new lobar hemorrhage from priors.     06/24/2012    Large left parietal - frontal - posterior left temporal lobe hemorrhagic infarct and mass effect upon the left lateral ventricle relatively similar to the recent exam.  Interval development of interventricular blood within the tract right lateral ventricle.  Subarachnoid blood most notable right convexity and right sylvian fissure more apparent than on the prior exam.  Bilateral anterior frontal lobe infarcts greater on the left with minimal associated petechial hemorrhage.    06/23/2012   Minimal change in the large left hemorrhagic infarct.  Minimal change in the midline shift.   06/22/2012    Image quality degraded by significant motion.  There is a large area of hemorrhage in the left parietal lobe with mass effect and 10 mm midline shift.  This is most consistent with hemorrhagic infarction following TPA.     06/22/2012   Ct Portable Head W/o Cm Large area of hemorrhagic infarction left parietal lobe is similar. There is improvement in blood in the corpus collosum/ caudate on the left.  8 mm midline shift to the right without hydrocephalus.    06/21/2012    Chronic ischemic changes, right greater than left.  No acute abnormality.    MRI of the brain    MRA of the brain    2D Echocardiogram  ejection fraction was in the range of 60% to 65%. Possible mild hypokinesis of the apical myocardium. - Aortic valve: Trivial regurgitation. - Mitral valve: Moderate regurgitation. - Left atrium: The atrium was mildly dilated. - Right ventricle: There was moderate hypertrophy. - Right atrium: The atrium was mildly dilated. - Pulmonary arteries: Systolic pressure was mildly  Carotid Doppler  Technically limited due to right IJ line and patient immobility. No evidence of significant ICA stenosis noted. Antegrade vertebral flow.  CXR   07/03/2012  Bibasilar atelectasis. 07/01/2012   Improved aeration bilaterally.    06/27/2012    Improved bibasilar aeration.  06/26/2012   1.  Low inspiratory volumes with new patchy right basilar opacity. Differential considerations include developing infiltrate, asymmetric pulmonary edema, and potentially atelectasis although this is less likely given the imaging appearance. 2.  Stable left retrocardiac opacity may reflect atelectasis or infiltrate. 3.  Pulmonary vascular congestion bordering on mild interstitial edema.    06/24/2012   Stable cardiomegaly.  No acute findings.    06/23/2012  1.  Nasogastric tube placement least as far as the stomach. 2.  Interval increase in bilateral edema or infiltrates.   06/22/2012  Tip of right jugular line projects over SVC without pneumothorax. Bibasilar atelectasis.   Original Report Authenticated By: Ulyses Southward, M.D.  06/21/2012   Moderate cardiomegaly.  No active disease.   EKG  normal sinus rhythm.  EEG no epileptiform activity  Therapy Recommendations    Physical Exam  General - the patient is resting comfortably. He coughs occasionally. Heart - Regular rate and rhythm - no murmer Lungs - diffuse rhonchi Abdomen - Soft - non tender Extremities - Distal pulses intact - no edema Skin - Warm and dry   Neurologic Exam  Mental Status: Sleeping - difficult to arouse. Opens eyes spontaneously. Does not track.  Not following commands.  Cranial nerves: pupils measure 4 mm bilaterally, reactive to light.The patient has a gaze preference to the left.  The eyes cannot be dolled across midline. Right face seems to be weaker than right.  Motor: MINIMAL SPONT MOVEMENT IN LUE.  WITHDRAWAL IN LLE. RUE AND RLE 0/5.   Reflexes: SLIGHTLY BRISK IN LUE AND LLE.   Coordination and gait: unable to test.  ASSESSMENT [name] is a 57 y.o. male presenting with expressive aphasia with unintelligible speech output. He is status post IV t-PA 06/21/2012 at 20:17. Symptoms  were consistent with left middle cerebral artery acute stroke.  Imaging confirmed a large left parietal - frontal - posterior left temporal lobe ischemic infarct with old right frontal infarct.  Followup CT repeated due to acute delirium, tonic-clonic seizure activity in left arm, decreasing consciousness. That scan 06/22/2012 shows that there is a large 6 cm  area of hemorrhagic transformation in the left parietal lobe with cerebral edema - mass effect upon the left lateral ventricle and 10 mm midline shift.  Patient was subsequently intubated for airway protection. 3% saline started to decrease cytotoxic edema. F/u CT shows interval development of interventricular blood within the tract right lateral ventricle and subarachnoid blood most notable right convexity and right sylvian fissure  Initial infarcts felt to be embolic, source most likely cardioembolic from acute MI. On no antithrombotics prior to admission. Now on no antithrombitics due to hemorrhage.  Patient with resultant VDRF - now extubated, global aphasia, right hemiparesis, dysphagia.   Hypernatremia, induced with 3% Saline to decrease cerebral edema - now on normal saline - sodium 152 on Friday  Hyperlipidemia, LDL 109, not on statin prior to admission, on atorvastatin now  Chronic kidney disease, stage 3  Malignant Hypertension, on admission, changing to medications and weaning off cardene with SBP goal < 160  Possible NSTEMI with Troponin I normal on admission, [facility] Dr Algie Coffer consulted, not a candidate for cardiac intervention until stabilizes.   Tachycardia, resolved. Placed On lopressor 100mg  total daily.   Seizure-like activity with hemorrhage, EEG without epileptiform activity. On dilantin.  Right basilar pneumonia, MSSA, treated with ancef - fever 100.1 today - currently not on antibiotics. Ordered a portable CXR today - read as improved.  Hospital day # 17  TREATMENT/PLAN  SBP goal  now < 180, (157/96) with changing to po medication, norvasc increased and added to beta-blocker regimen. BP >180/110. Clonidine added. Patient is now extubated, DNR.  Continue current management. Critical care team help very much appreciated. Transferred out of the unit to [address] [place] yesterday Will continue to follow.   Spoke with patient son and daughter and updated them about patient's current neurological status.  Febrile today with normal chest x ray.  Will check CBC, urine, and electrolytes.Wyatt Portela, MD

## 2012-07-09 LAB — GLUCOSE, CAPILLARY
Glucose-Capillary: 239 mg/dL — ABNORMAL HIGH (ref 70–99)
Glucose-Capillary: 276 mg/dL — ABNORMAL HIGH (ref 70–99)
Glucose-Capillary: 319 mg/dL — ABNORMAL HIGH (ref 70–99)
Glucose-Capillary: 364 mg/dL — ABNORMAL HIGH (ref 70–99)

## 2012-07-09 LAB — BASIC METABOLIC PANEL
BUN: 92 mg/dL — ABNORMAL HIGH (ref 6–23)
CO2: 21 mEq/L (ref 19–32)
Calcium: 9.2 mg/dL (ref 8.4–10.5)
Chloride: 129 mEq/L — ABNORMAL HIGH (ref 96–112)
Creatinine, Ser: 2.83 mg/dL — ABNORMAL HIGH (ref 0.50–1.35)

## 2012-07-09 MED ORDER — FREE WATER
300.0000 mL | Freq: Four times a day (QID) | Status: DC
  Administered 2012-07-09 – 2012-07-11 (×9): 300 mL

## 2012-07-09 MED ORDER — PRO-STAT SUGAR FREE PO LIQD
30.0000 mL | Freq: Two times a day (BID) | ORAL | Status: DC
  Administered 2012-07-09 – 2012-07-16 (×15): 30 mL
  Filled 2012-07-09 (×17): qty 30

## 2012-07-09 NOTE — Progress Notes (Signed)
Temp of 102.0 axillary, suppository Tylenol given. Dr. Anne Hahn paged. Will recheck temp.

## 2012-07-09 NOTE — Progress Notes (Signed)
Stroke Team Progress Note  HISTORY [name] is a 57 y.o. 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 No focal weakness was noted. The  patient had been taking aspirin 81 mg per day. CT scan showed no acute intracranial abnormality. NIH stroke score was 8. Patient had marked expressive aphasia as well as gaze preference to the left side and right visual field defect to visual confrontation. He was deemed a candidate for intravenous thrombolytic therapy with TPA.   LSN: [id number] on 06/21/2012  tPA Given: Yes  MRankin: 2  SUBJECTIVE. Son at bedside. Per RN, wife has been here all morning. She is not currently at the bedside.  OBJECTIVE Most recent Vital Signs: Filed Vitals:   07/09/12 0215 07/09/12 0500 07/09/12 0656 07/09/12 1050  BP: 165/91  147/86 175/91  Pulse: 101  90 92  Temp: 99.1 F (37.3 C)  100.3 F (37.9 C) 97.9 F (36.6 C)  TempSrc: Oral  Axillary Axillary  Resp: 16  18 18   Height:      Weight:  67.631 kg (149 lb 1.6 oz)    SpO2: 98%  95% 98%   CBG (last 3)   Recent Labs  07/09/12 0341 07/09/12 0800 07/09/12 1054  GLUCAP 178* 239* 276*    IV Fluid Intake:   . sodium chloride 20 mL/hr at 07/07/12 0000  . feeding supplement (JEVITY 1.2 CAL) 1,000 mL (07/08/12 2104)  . niCARDipine Stopped (07/04/12 0915)    MEDICATIONS  . antiseptic oral rinse  15 mL Mouth Rinse QID  . chlorhexidine  15 mL Mouth Rinse BID  . cloNIDine  0.2 mg Transdermal Weekly  . feeding supplement  30 mL Per Tube BID  . free water  300 mL Per Tube Q8H  . insulin aspart  0-9 Units Subcutaneous Q4H  . metoprolol  2.5 mg Intravenous Q6H  . phenytoin (DILANTIN) IV  100 mg Intravenous Q8H   . sodium chloride  10-40 mL Intracatheter Q12H   PRN:  acetaminophen, acetaminophen, artificial tears, fentaNYL, labetalol, LORazepam, metoprolol  Diet:  NPO tube feeds  Activity:  Bedrest DVT Prophylaxis:  SCD  CLINICALLY SIGNIFICANT STUDIES Basic Metabolic Panel:   Recent Labs Lab 07/05/12 0500 07/06/12 0545 07/08/12 1412 07/09/12 1015  NA 157* 152* 160* 168*  K 4.1 4.4 4.7 4.7  CL 119* 114* 123* 129*  CO2 28 27 23 21   GLUCOSE 209* 287* 159* 299*  BUN 55* 61* 73* 92*  CREATININE 1.95* 2.01* 2.47* 2.83*  CALCIUM 9.0 8.7 9.1 9.2  MG 2.5 2.6*  --   --   PHOS 4.1 4.6  --   --    Liver Function Tests:   Recent Labs Lab 07/08/12 1412  AST 338*  ALT 138*  ALKPHOS 135*  BILITOT 0.4  PROT 8.5*  ALBUMIN 2.3*   CBC:   Recent Labs Lab 07/06/12 0545 07/08/12 1412  WBC 13.7* 20.8*  NEUTROABS  --  18.3*  HGB 11.5* 13.4  HCT 39.4 45.2  MCV 67.0* 68.5*  PLT 296 323   Coagulation:  No results found for this basename: LABPROT, INR,  in the last 168 hours Cardiac Enzymes:  No results found for this basename: CKTOTAL, CKMB, CKMBINDEX, TROPONINI,  in the last 168 hours Urinalysis:  Recent Labs Lab 07/08/12 1500  COLORURINE YELLOW  LABSPEC 1.025  PHURINE 5.0  GLUCOSEU NEGATIVE  HGBUR LARGE*  BILIRUBINUR NEGATIVE  KETONESUR NEGATIVE  PROTEINUR >300*  UROBILINOGEN 0.2  NITRITE NEGATIVE  LEUKOCYTESUR SMALL*   Lipid Panel    Component Value Date/Time   CHOL 185 06/22/2012 0525   TRIG 294* 06/28/2012 0600   HDL 58 06/22/2012 0525   CHOLHDL 3.2 06/22/2012 0525   VLDL 18 06/22/2012 0525   LDLCALC 109* 06/22/2012 0525   HgbA1C  Lab Results  Component Value Date   HGBA1C 6.2* 06/24/2012   Urine Drug Screen:   No results found for this basename: labopia,  cocainscrnur,  labbenz,  amphetmu,  thcu,  labbarb    Alcohol Level: No results found for this basename: ETH,  in the last 168 hours  Ct Head Wo Contrast 07/03/2012 No significant change. Blood in the  occipital horn of the right lateral ventricle is more apparent. Midline shift is essentially unchanged. No new hemorrhage on the left.  06/26/2012    Overall slight improvement in the degree of midline shift compared with priors.  No new areas of infarction.  No convincing signs for trapped right lateral ventricle on today's exam.  Stable multifocal predominantly left MCA territory infarcts with hemorrhagic transformation; no new infarcts or new lobar hemorrhage from priors.     06/24/2012    Large left parietal - frontal - posterior left temporal lobe hemorrhagic infarct and mass effect upon the left lateral ventricle relatively similar to the recent exam.  Interval development of interventricular blood within the tract right lateral ventricle.  Subarachnoid blood most notable right convexity and right sylvian fissure more apparent than on the prior exam.  Bilateral anterior frontal lobe infarcts greater on the left with minimal associated petechial hemorrhage.    06/23/2012   Minimal change in the large left hemorrhagic infarct.  Minimal change in the midline shift.   06/22/2012    Image quality degraded by significant motion.  There is a large area of hemorrhage in the left parietal lobe with mass effect and 10 mm midline shift.  This is most consistent with hemorrhagic infarction following TPA.     06/22/2012   Ct Portable Head W/o Cm Large area of hemorrhagic infarction left parietal lobe is similar. There is improvement in blood in the corpus collosum/ caudate on the left.  8 mm midline shift to the right without hydrocephalus.    06/21/2012    Chronic ischemic changes, right greater than left.  No acute abnormality.    MRI of the brain    MRA of the brain    2D Echocardiogram  ejection fraction was in the range of 60% to 65%. Possible mild hypokinesis of the apical myocardium. - Aortic valve: Trivial regurgitation. - Mitral valve: Moderate regurgitation. - Left atrium: The atrium was mildly  dilated. - Right ventricle: There was moderate hypertrophy. - Right atrium: The atrium was mildly dilated. - Pulmonary arteries: Systolic pressure was mildly  Carotid Doppler  Technically limited due to right IJ line and patient immobility. No evidence of significant ICA stenosis noted. Antegrade vertebral flow.  CXR   07/03/2012 Bibasilar atelectasis. 07/01/2012   Improved aeration bilaterally.    06/27/2012    Improved bibasilar aeration.  06/26/2012   1.  Low inspiratory volumes with new patchy right basilar opacity. Differential considerations include developing infiltrate, asymmetric pulmonary edema, and potentially atelectasis although this is less likely given the imaging appearance. 2.  Stable left  retrocardiac opacity may reflect atelectasis or infiltrate. 3.  Pulmonary vascular congestion bordering on mild interstitial edema.    06/24/2012   Stable cardiomegaly.  No acute findings.    06/23/2012  1.  Nasogastric tube placement least as far as the stomach. 2.  Interval increase in bilateral edema or infiltrates.   06/22/2012  Tip of right jugular line projects over SVC without pneumothorax. Bibasilar atelectasis.   Original Report Authenticated By: Ulyses Southward, M.D.  06/21/2012   Moderate cardiomegaly.  No active disease.   EKG  normal sinus rhythm.   EEG no epileptiform activity  Therapy Recommendations    Physical Exam  General - the patient is resting comfortably. He coughs occasionally. Heart - Regular rate and rhythm - no murmer Lungs - diffuse rhonchi Abdomen - Soft - non tender Extremities - Distal pulses intact - no edema Skin - Warm and dry   Neurologic Exam  Mental Status: Sleeping - difficult to arouse. Opens eyes spontaneously. Does not track.  Not following commands.  Cranial nerves: pupils are symmetric. The patient will cut the eyes bilaterally, no blink to threat from either side. The patient is non-verbal. He will not follow verbal commands.  Motor:    Decorticate on the left arm with sternal rub.  Reflexes: SLIGHTLY BRISK IN LUE AND LLE.   Coordination and gait: unable to test.  ASSESSMENT [name] is a 57 y.o. male presenting with expressive aphasia with unintelligible speech output. He is status post IV t-PA 06/21/2012 at 20:17. Symptoms were consistent with left middle cerebral artery acute stroke.  Imaging confirmed a large left parietal - frontal - posterior left temporal lobe ischemic infarct with old right frontal infarct.  Followup CT repeated due to acute delirium, tonic-clonic seizure activity in left arm, decreasing consciousness. That scan 06/22/2012 shows that there is a large 6 cm  area of hemorrhagic transformation in the left parietal lobe with cerebral edema - mass effect upon the left lateral ventricle and 10 mm midline shift.  Patient was subsequently intubated for airway protection. 3% saline started to decrease cytotoxic edema. F/u CT shows interval development of interventricular blood within the tract right lateral ventricle and subarachnoid blood most notable right convexity and right sylvian fissure  Initial infarcts felt to be embolic, source most likely cardioembolic from acute MI. On no antithrombotics prior to admission. Now on no antithrombitics due to hemorrhage.  Patient with resultant VDRF - now extubated, global aphasia, right hemiparesis, dysphagia, left arm decorticate.   Hypernatremia, induced with 3% Saline to decrease cerebral edema -  sodium dropped, now has increased to 168 in the setting of elevated bun and creatinine. Patient becoming dehydration.  Hyperlipidemia, LDL 109, not on statin prior to admission, on atorvastatin now  Chronic kidney disease, stage 3  Malignant Hypertension, on admission, changing to medications and weaning off cardene with SBP goal  < 180,  norvasc increased and added to beta-blocker regimen. Clonidine added.  Possible NSTEMI with Troponin I normal on admission,  elevation to 11.15 peak hospital D#2, Dr Algie Coffer consulted, not a candidate for cardiac intervention until stabilizes.   Tachycardia, resolved. Placed On lopressor 100mg  total daily.   Seizure-like activity with hemorrhage, EEG without epileptiform activity. On dilantin.  Right basilar pneumonia, MSSA, treated with ancef - fever 100.1 today - currently not on antibiotics. Ordered a portable CXR today - read as improved.  Hospital day # 18  TREATMENT/PLAN  Patient is now  DNR.   Appears aggressive care underway,  unsure wife's stance on comfort care   Patient medically worse - ? Level of treatment  Left message for wife on her cell to discuss with Dr. Anne Hahn.  Will increase free water to 300 cc q 6 h  Child psychotherapist for placement  Annie Main, MSN, RN, ANVP-BC, ANP-BC, GNP-BC [facility] Pager: [phone] 07/09/2012 12:43 PM  I have personally obtained a history, examined the patient, evaluated imaging results, and formulated the assessment and plan of care. I agree with the above. Lesly Dukes

## 2012-07-09 NOTE — Progress Notes (Signed)
Recheck temp was 99.7 axillary. Cooling blanket placed under pt.

## 2012-07-09 NOTE — Progress Notes (Signed)
CRITICAL RESULT Sodium level of 168, S. Biby, NP made aware.

## 2012-07-09 NOTE — Progress Notes (Signed)
Talked to patient's spouse about DCP; she is agreeable to SNF placement; Soc worker made aware; Lots of emotional support given; B Zanaiya Calabria RN,BSN,MHA

## 2012-07-09 NOTE — Progress Notes (Signed)
NUTRITION CONSULT/FOLLOW UP  Intervention:    Continue Jevity 1.2 formula at 60 ml/hr  Add Prostat liquid protein 30 ml twice daily (100 kcals, 15 gm protein per dose).  Total TF regimen to provide 1928 kcals, 110 gm protein, 1162 ml of free water RD to follow for nutrition care plan  Nutrition Dx:   Inadequate oral intake related to inability to eat as evidenced by NPO status, ongoing  Goal:   TF to meet > 90% of estimated nutrition needs, met  Monitor:   TF regimen & tolerance, weight, labs, I/O's  Assessment:   Patient with left CMA, received t-PA, CT on 6/13 showed new left ICH with midline shift.  Patient extubated 6/27.  Transferred out of Neuro ICU.    Jevity 1.2 formula infusing via NGT (placed 6/29, tip in mid body of stomach) at 60 ml/hr providing 1728 kcals, 80 gm protein, 1162 ml of free water.  Free water flushes at 300 ml every 8 hours.  RD to order additional protein to better meet re-estimated nutrition needs.  Noted prognosis poor for neuro recovery.  Height: Ht Readings from Last 1 Encounters:  06/22/12 5\' 11"  (1.803 m)    Weight Status ---> trended down since admission Wt Readings from Last 1 Encounters:  07/09/12 149 lb 1.6 oz (67.631 kg)    Re-estimated needs:  Kcal: 1800-2000 Protein: 90-110 gm Fluid: 1.8-2.0 L  Skin: Intact  Diet Order: NPO   Intake/Output Summary (Last 24 hours) at 07/09/12 1115 Last data filed at 07/09/12 1000  Gross per 24 hour  Intake      0 ml  Output   1550 ml  Net  -1550 ml    Last BM: 6/29  Labs:   Recent Labs Lab 07/03/12 0500  07/05/12 0500 07/06/12 0545 07/08/12 1412  NA 158*  < > 157* 152* 160*  K 3.9  < > 4.1 4.4 4.7  CL 120*  < > 119* 114* 123*  CO2 27  < > 28 27 23   BUN 48*  < > 55* 61* 73*  CREATININE 1.88*  < > 1.95* 2.01* 2.47*  CALCIUM 8.8  < > 9.0 8.7 9.1  MG 2.5  --  2.5 2.6*  --   PHOS 3.8  --  4.1 4.6  --   GLUCOSE 220*  < > 209* 287* 159*  < > = values in this interval not  displayed.  CBG (last 3)   Recent Labs  07/09/12 0341 07/09/12 0800 07/09/12 1054  GLUCAP 178* 239* 276*    Scheduled Meds: . antiseptic oral rinse  15 mL Mouth Rinse QID  . chlorhexidine  15 mL Mouth Rinse BID  . cloNIDine  0.2 mg Transdermal Weekly  . free water  300 mL Per Tube Q8H  . insulin aspart  0-9 Units Subcutaneous Q4H  . metoprolol  2.5 mg Intravenous Q6H  . phenytoin (DILANTIN) IV  100 mg Intravenous Q8H  . sodium chloride  10-40 mL Intracatheter Q12H    Continuous Infusions: . sodium chloride 20 mL/hr at 07/07/12 0000  . feeding supplement (JEVITY 1.2 CAL) 1,000 mL (07/08/12 2104)  . niCARDipine Stopped (07/04/12 0915)    Maureen Chatters, RD, LDN Pager #: [phone] After-Hours Pager #: [phone]

## 2012-07-10 ENCOUNTER — Encounter: Payer: Self-pay | Admitting: Nurse Practitioner

## 2012-07-10 ENCOUNTER — Encounter: Payer: Self-pay | Admitting: Neurology

## 2012-07-10 ENCOUNTER — Inpatient Hospital Stay (HOSPITAL_COMMUNITY): Payer: Medicare Other

## 2012-07-10 LAB — CBC
Hemoglobin: 13.6 g/dL (ref 13.0–17.0)
MCH: 20.4 pg — ABNORMAL LOW (ref 26.0–34.0)
MCV: 70.7 fL — ABNORMAL LOW (ref 78.0–100.0)
RBC: 6.66 MIL/uL — ABNORMAL HIGH (ref 4.22–5.81)

## 2012-07-10 LAB — GLUCOSE, CAPILLARY
Glucose-Capillary: 306 mg/dL — ABNORMAL HIGH (ref 70–99)
Glucose-Capillary: 420 mg/dL — ABNORMAL HIGH (ref 70–99)
Glucose-Capillary: 389 mg/dL — ABNORMAL HIGH (ref 70–99)

## 2012-07-10 LAB — BASIC METABOLIC PANEL
CO2: 21 mEq/L (ref 19–32)
Glucose, Bld: 459 mg/dL — ABNORMAL HIGH (ref 70–99)
Potassium: 4.3 mEq/L (ref 3.5–5.1)
Sodium: 170 mEq/L (ref 135–145)

## 2012-07-10 LAB — URINALYSIS, ROUTINE W REFLEX MICROSCOPIC
Glucose, UA: >1000 mg/dL — AB
Protein, ur: 100 mg/dL — AB
pH: 5 (ref 5.0–8.0)

## 2012-07-10 LAB — URINE MICROSCOPIC-ADD ON

## 2012-07-10 MED ORDER — INSULIN ASPART 100 UNIT/ML ~~LOC~~ SOLN
10.0000 [IU] | Freq: Once | SUBCUTANEOUS | Status: AC
  Administered 2012-07-10: 10 [IU] via SUBCUTANEOUS

## 2012-07-10 MED ORDER — JEVITY 1.2 CAL PO LIQD
1000.0000 mL | ORAL | Status: DC
  Administered 2012-07-10: 1000 mL
  Filled 2012-07-10 (×3): qty 1000

## 2012-07-10 MED ORDER — LEVOFLOXACIN IN D5W 750 MG/150ML IV SOLN
750.0000 mg | INTRAVENOUS | Status: DC
  Administered 2012-07-10: 750 mg via INTRAVENOUS
  Filled 2012-07-10: qty 150

## 2012-07-10 NOTE — Progress Notes (Addendum)
Patient ID: [name], male   DOB: 1955/04/19, 57 y.o.   MRN: [id number] Request received for gastrostomy tube placement in pt with hx CVA, malnutrition/inability to eat- currently on NG tube feeds. Imaging studies were reviewed by Dr. Archer Asa. Pt noted to have fever of 102 on 6/30 and 99 7/1.  WBC increased to 28k. Blood/urine cx's pending. Will monitor temp curve and check blood cx results before scheduling gastrostomy tube. Neurology aware of above.

## 2012-07-10 NOTE — Progress Notes (Signed)
Stroke Team Progress Note  HISTORY [name] is a 57 y.o. 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 No focal weakness was noted. The  patient had been taking aspirin 81 mg per day. CT scan showed no acute intracranial abnormality. NIH stroke score was 8. Patient had marked expressive aphasia as well as gaze preference to the left side and right visual field defect to visual confrontation. He was deemed a candidate for intravenous thrombolytic therapy with TPA.   LSN: [id number] on 06/21/2012  tPA Given: Yes  MRankin: 2  SUBJECTIVE. Wife and auntie at the bedside today along with other male relatives. wife did not return my phone call yesterday, but did speak with SW. Appears wife is interested in hospice and rest of family want aggressive care. In front on family, wife agrees to take him home and wants aggressive care.  OBJECTIVE Most recent Vital Signs: Filed Vitals:   07/09/12 2024 07/09/12 2258 07/10/12 0209 07/10/12 0503  BP:  149/93 161/93 183/98  Pulse:  105 96 86  Temp: 100.3 F (37.9 C) 99.4 F (37.4 C) 99.2 F (37.3 C) 97.5 F (36.4 C)  TempSrc:  Axillary Axillary Axillary  Resp:  20 20 20   Height:      Weight:      SpO2:  99% 95% 100%   CBG (last 3)   Recent Labs  07/10/12 0416 07/10/12 0759 07/10/12 0825  GLUCAP 335* 357* 327*    IV Fluid Intake:   . sodium chloride 20 mL/hr at 07/07/12 0000  . feeding supplement (JEVITY 1.2 CAL) 60 mL/hr at 07/09/12 2038  . niCARDipine Stopped (07/04/12 0915)    MEDICATIONS  . antiseptic oral rinse  15 mL Mouth Rinse QID  . chlorhexidine  15 mL Mouth Rinse BID  . cloNIDine  0.2 mg Transdermal Weekly  . feeding  supplement  30 mL Per Tube BID  . free water  300 mL Per Tube Q6H  . insulin aspart  0-9 Units Subcutaneous Q4H  . metoprolol  2.5 mg Intravenous Q6H  . phenytoin (DILANTIN) IV  100 mg Intravenous Q8H  . sodium chloride  10-40 mL Intracatheter Q12H   PRN:  acetaminophen, acetaminophen, artificial tears, fentaNYL, labetalol, LORazepam, metoprolol  Diet:  NPO tube feeds  Activity:  Bedrest DVT Prophylaxis:  SCD  CLINICALLY SIGNIFICANT STUDIES Basic Metabolic Panel:   Recent Labs Lab 07/05/12 0500 07/06/12 0545 07/08/12 1412 07/09/12 1015  NA 157* 152* 160* 168*  K 4.1 4.4 4.7 4.7  CL 119* 114* 123* 129*  CO2 28 27 23 21   GLUCOSE 209* 287* 159* 299*  BUN 55* 61* 73* 92*  CREATININE 1.95* 2.01* 2.47* 2.83*  CALCIUM 9.0 8.7 9.1 9.2  MG 2.5 2.6*  --   --   PHOS 4.1 4.6  --   --    Liver Function Tests:   Recent Labs Lab 07/08/12 1412  AST 338*  ALT 138*  ALKPHOS 135*  BILITOT 0.4  PROT 8.5*  ALBUMIN 2.3*   CBC:   Recent Labs Lab 07/06/12 0545 07/08/12 1412  WBC 13.7* 20.8*  NEUTROABS  --  18.3*  HGB 11.5* 13.4  HCT 39.4 45.2  MCV 67.0* 68.5*  PLT 296 323   Coagulation:  No  results found for this basename: LABPROT, INR,  in the last 168 hours Cardiac Enzymes:  No results found for this basename: CKTOTAL, CKMB, CKMBINDEX, TROPONINI,  in the last 168 hours Urinalysis:   Recent Labs Lab 07/08/12 1500  COLORURINE YELLOW  LABSPEC 1.025  PHURINE 5.0  GLUCOSEU NEGATIVE  HGBUR LARGE*  BILIRUBINUR NEGATIVE  KETONESUR NEGATIVE  PROTEINUR >300*  UROBILINOGEN 0.2  NITRITE NEGATIVE  LEUKOCYTESUR SMALL*   Lipid Panel    Component Value Date/Time   CHOL 185 06/22/2012 0525   TRIG 294* 06/28/2012 0600   HDL 58 06/22/2012 0525   CHOLHDL 3.2 06/22/2012 0525   VLDL 18 06/22/2012 0525   LDLCALC 109* 06/22/2012 0525   HgbA1C  Lab Results  Component Value Date   HGBA1C 6.2* 06/24/2012   Urine Drug Screen:   No results found for this basename: labopia,   cocainscrnur,  labbenz,  amphetmu,  thcu,  labbarb    Alcohol Level: No results found for this basename: ETH,  in the last 168 hours  Ct Head Wo Contrast 07/03/2012 No significant change. Blood in the occipital horn of the right lateral ventricle is more apparent. Midline shift is essentially unchanged. No new hemorrhage on the left.  06/26/2012    Overall slight improvement in the degree of midline shift compared with priors.  No new areas of infarction.  No convincing signs for trapped right lateral ventricle on today's exam.  Stable multifocal predominantly left MCA territory infarcts with hemorrhagic transformation; no new infarcts or new lobar hemorrhage from priors.     06/24/2012    Large left parietal - frontal - posterior left temporal lobe hemorrhagic infarct and mass effect upon the left lateral ventricle relatively similar to the recent exam.  Interval development of interventricular blood within the tract right lateral ventricle.  Subarachnoid blood most notable right convexity and right sylvian fissure more apparent than on the prior exam.  Bilateral anterior frontal lobe infarcts greater on the left with minimal associated petechial hemorrhage.    06/23/2012   Minimal change in the large left hemorrhagic infarct.  Minimal change in the midline shift.   06/22/2012    Image quality degraded by significant motion.  There is a large area of hemorrhage in the left parietal lobe with mass effect and 10 mm midline shift.  This is most consistent with hemorrhagic infarction following TPA.     06/22/2012   Ct Portable Head W/o Cm Large area of hemorrhagic infarction left parietal lobe is similar. There is improvement in blood in the corpus collosum/ caudate on the left.  8 mm midline shift to the right without hydrocephalus.    06/21/2012    Chronic ischemic changes, right greater than left.  No acute abnormality.    MRI of the brain    MRA of the brain    2D Echocardiogram  ejection fraction  was in the range of 60% to 65%. Possible mild hypokinesis of the apical myocardium. - Aortic valve: Trivial regurgitation. - Mitral valve: Moderate regurgitation. - Left atrium: The atrium was mildly dilated. - Right ventricle: There was moderate hypertrophy. - Right atrium: The atrium was mildly dilated. - Pulmonary arteries: Systolic pressure was mildly  Carotid Doppler  Technically limited due to right IJ line and patient immobility. No evidence of significant ICA stenosis noted. Antegrade vertebral flow.  CXR   07/08/2012 Interval improvement in the atelectasis at the bases. Status post  removal of support apparatus. No additional interval changes. 07/03/2012 Bibasilar atelectasis.  07/01/2012   Improved aeration bilaterally.    06/27/2012    Improved bibasilar aeration.  06/26/2012   1.  Low inspiratory volumes with new patchy right basilar opacity. Differential considerations include developing infiltrate, asymmetric pulmonary edema, and potentially atelectasis although this is less likely given the imaging appearance. 2.  Stable left retrocardiac opacity may reflect atelectasis or infiltrate. 3.  Pulmonary vascular congestion bordering on mild interstitial edema.    06/24/2012   Stable cardiomegaly.  No acute findings.    06/23/2012  1.  Nasogastric tube placement least as far as the stomach. 2.  Interval increase in bilateral edema or infiltrates.   06/22/2012  Tip of right jugular line projects over SVC without pneumothorax. Bibasilar atelectasis.   Original Report Authenticated By: Ulyses Southward, M.D.  06/21/2012   Moderate cardiomegaly.  No active disease.   EKG  normal sinus rhythm.   EEG no epileptiform activity  Therapy Recommendations    Physical Exam  General - the patient is resting comfortably. He coughs occasionally. Heart - Regular rate and rhythm - no murmer Lungs - diffuse rhonchi Abdomen - Soft - non tender Extremities - Distal pulses intact - no edema Skin - Warm and  dry   Neurologic Exam  Mental Status: Sleeping - difficult to arouse. Opens eyes spontaneously. Does not track.  Not following commands.  Cranial nerves: pupils are symmetric. The patient will cut the eyes bilaterally, no blink to threat from either side. The patient is non-verbal. He will not follow verbal commands.  Motor:   Decorticate on the left arm with sternal rub.  Reflexes: SLIGHTLY BRISK IN LUE AND LLE.   Coordination and gait: unable to test.  ASSESSMENT [name] is a 57 y.o. male presenting with expressive aphasia with unintelligible speech output. He is status post IV t-PA 06/21/2012 at 20:17. Symptoms were consistent with left middle cerebral artery acute stroke.  Imaging confirmed a large left parietal - frontal - posterior left temporal lobe ischemic infarct with old right frontal infarct.  Followup CT repeated due to acute delirium, tonic-clonic seizure activity in left arm, decreasing consciousness. That scan 06/22/2012 shows that there is a large 6 cm  area of hemorrhagic transformation in the left parietal lobe with cerebral edema - mass effect upon the left lateral ventricle and 10 mm midline shift.  Patient was subsequently intubated for airway protection. 3% saline started to decrease cytotoxic edema. F/u CT shows interval development of interventricular blood within the tract right lateral ventricle and subarachnoid blood most notable right convexity and right sylvian fissure  Initial infarcts felt to be embolic, source most likely cardioembolic from acute MI. On no antithrombotics prior to admission. Now on no antithrombitics due to hemorrhage.  Patient with resultant VDRF - now extubated, stuporous, global aphasia, right hemiparesis, dysphagia, left arm decorticate.   Hypernatremia, induced with 3% Saline to decrease cerebral edema -  sodium dropped, now has increased to 168 in the setting of elevated bun and creatinine.   Dehydration.   Hyperlipidemia, LDL  109, not on statin prior to admission, on atorvastatin now  Chronic kidney disease, stage 3 worsening renal failure.  Malignant Hypertension, on admission, changing to medications and weaning off cardene with SBP goal  < 180,  norvasc increased and added to beta-blocker regimen. Clonidine added.  Possible NSTEMI with Troponin I normal on admission, elevation to 11.15 [facility] D[facility], Dr Algie Coffer consulted, not a candidate for cardiac intervention until stabilizes.   Tachycardia, resolved. Placed On lopressor  100mg  total daily.   Seizure-like activity with hemorrhage, EEG without epileptiform activity. On dilantin.  Right basilar pneumonia, MSSA, treated with ancef - fever 102  currently not on antibiotics.   Hospital day # 19  TREATMENT/PLAN  Patient is now  DNR.   Per wife and family members (most verbal was sister, wife said little) but all at the bedside and agreed, they want ongoing aggressive care  Will place PEG in radiology  Increase IVF  Check labs  Repeat CXR  Social worker for placement vs home health, family choice  Annie Main, MSN, RN, ANVP-BC, ANP-BC, [facility] Pager: [phone] 07/10/2012 10:20 AM  I have personally obtained a history, examined the patient, evaluated imaging results, and formulated the assessment and plan of care. I agree with the above. Ledora Bottcher KEITH

## 2012-07-10 NOTE — Progress Notes (Signed)
Inpatient Diabetes Program Recommendations  AACE/ADA: New Consensus Statement on Inpatient Glycemic Control (2013)  Target Ranges:  Prepandial:   less than 140 mg/dL      Peak postprandial:   less than 180 mg/dL (1-2 hours)      Critically ill patients:  140 - 180 mg/dL     Results for GEOVANNI, RAHMING (MRN [id number]) as of 07/10/2012 11:37  Ref. Range 07/08/2012 23:45 07/09/2012 03:41 07/09/2012 08:00 07/09/2012 10:54 07/09/2012 17:07 07/09/2012 20:20  Glucose-Capillary Latest Range: 70-99 mg/dL 865 (H) 784 (H) 696 (H) 276 (H) 319 (H) 364 (H)    Results for DENMAN, PICHARDO (MRN [id number]) as of 07/10/2012 11:37  Ref. Range 07/10/2012 00:31 07/10/2012 04:16 07/10/2012 07:59  Glucose-Capillary Latest Range: 70-99 mg/dL 440 (H) 102 (H) 725 (H)    Noted plans to have PEG tube placed to restart continuous tube feeds for this patient.  Noted family in disagreement about level of aggression with care, however, it looks as if family has decided to continue aggressive care at this point.    Patient continues to have severe hyperglycemia.  Only getting Novolog Sensitive correction scale (SSI) Q4 hours at present.  May want to consider increasing Novolog to Moderate scale Q4 hours.  Once tube feeds restarted, may need scheduled Novolog coverage to cover the carbohydrates in the tube feedings.  Will follow while inpatient.    Ambrose Finland RN, MSN, CDE Diabetes Coordinator Inpatient Diabetes Program [phone]

## 2012-07-10 NOTE — Progress Notes (Signed)
Critical value for Na at 170 and chloride > than 130 called from lad at 1604 . Called made to dr Anne Hahn nurse and did not received  A call back Dr Vonita Moss paged and received new orders at 1730 and same carried out.

## 2012-07-10 NOTE — Clinical Social Work Note (Signed)
CSW spoke to pt wife re: disposition.  Pt wife had some time sensitive things to do and wanted to set a time to meet with me tomorrow morning.  CSW will meet with wife at beside on Wednesday between 10 and 10:30.  Pt wife is requesting [facility] as SNF placement.  [facility] has offered a bed.  CSW called [facility] to confirm bed.  CSW will inform wife/family of this in the meeting on Wednesday.  Vickii Penna, LCSWA [phone]  Clinical Social Work

## 2012-07-10 NOTE — Clinical Social Work Placement (Signed)
Clinical Social Work Department CLINICAL SOCIAL WORK PLACEMENT NOTE 07/09/2012  Patient:  [name], [name]  Account Number:  [id number] Admit date:  06/21/2012  Clinical Social Worker:  Macario Golds, LCSW  Date/time:  07/09/2012 04:35 PM  Clinical Social Work is seeking post-discharge placement for this patient at the following level of care:   SKILLED NURSING   (*CSW will update this form in Epic as items are completed)   07/09/2012  Patient/family provided with [facility] Department of Clinical Social Work's list of facilities offering this level of care within the geographic area requested by the patient (or if unable, by the patient's family).  07/09/2012  Patient/family informed of their freedom to choose among providers that offer the needed level of care, that participate in Medicare, Medicaid or managed care program needed by the patient, have an available bed and are willing to accept the patient.  07/09/2012  Patient/family informed of [facility]' ownership interest in [facility], as well as of the fact that they are under no obligation to receive care at this facility.  PASARR submitted to EDS on 07/09/2012 PASARR number received from EDS on 07/09/2012  FL2 transmitted to all facilities in geographic area requested by pt/family on  07/09/2012 FL2 transmitted to all facilities within larger geographic area on   Patient informed that his/her managed care company has contracts with or will negotiate with  certain facilities, including the following:     Patient/family informed of bed offers received:   Patient chooses bed at  Physician recommends and patient chooses bed at    Patient to be transferred to  on   Patient to be transferred to facility by   The following physician request were entered in Epic:   Additional Comments:

## 2012-07-10 NOTE — Progress Notes (Signed)
Talked to patient's spouse about DCP; spouse wants patient placed in a nursing facility/ SNF [facility]. (There is a constant battle between the spouse and his children about DCP and the wife stated that she knows that she cannot take care of him 24hrs and a nursing facility would be the best place for him and his children do not understand that.) Soc Worker Rene Kocher called and updated and will see the patient todayAlexis Goodell [phone]

## 2012-07-10 NOTE — Clinical Social Work Note (Signed)
Clinical Social Work Department BRIEF PSYCHOSOCIAL ASSESSMENT 07/09/2012  Patient:  [name], [name]     Account Number:  [id number]     Admit date:  06/21/2012  Clinical Social Worker:  Verl Blalock  Date/Time:  07/09/2012 12:00 M  Referred by:  Care Management  Date Referred:  07/09/2012 Referred for  SNF Placement  Residential hospice placement   Other Referral:   Pending family meeting and goals of care decisions   Interview type:  Family Other interview type:   No family present at bedside - spoke with patient wife over the phone    PSYCHOSOCIAL DATA Living Status:  FAMILY Admitted from facility:   Level of care:   Primary support name:  Ashok Croon  [phone] Primary support relationship to patient:  SPOUSE Degree of support available:   Strong    CURRENT CONCERNS Current Concerns  Post-Acute Placement   Other Concerns:    SOCIAL WORK ASSESSMENT / PLAN Clinical Social Worker spoke with patient wife over the phone to offer support and discuss patient plans at discharge.  Patient wife states that patient was living at home with her and his daughter prior to hospitalization. Patient was very independent and able to do for himself even after a previous stroke.  Patient has children from another relationship who are not in agreement with the care that patient should receive.  Patient wife is leaning more towards Hospice on patient behalf to respect his wishes, however patient children state that those choices are against their beliefs and not appropriate for patient. Patient has had a previous stroke in which MD prepared family for the worst and patient was able to make a full recovery.  Patient children holding on to that hope. Patient wife and children plan to have a family meeting with attending to place patient goals of care in place and determine most appropriate avenue for discharge.    Patient wife states that she is currently in agreement with SNF search in  [facility] but would like to await family meeting prior to any final decision making.  CSW has completed FL2 and initiated search in [facility] per family request.  CSW remains available for support and will address further with patient family regarding discharge plans once goals of care are in place.  CSW to facilitate patient discharge needs once medically stable and appropriate disposition is determined.   Assessment/plan status:  Psychosocial Support/Ongoing Assessment of Needs Other assessment/ plan:   Information/referral to community resources:   Visual merchandiser provided patient wife with facility list at bedside to share with patient children upon arrival.    PATIENT'S/FAMILY'S RESPONSE TO PLAN OF CARE: Patient has been extubated and made a DNR, therefore no plans for re-intubation.  Patient children remain hopeful for miraculous recovery in spite of MD feelings of poor prognosis.  Patient wife seems more realistic with the idea of Hospice getting involved.  Patient children and wife plan to meet with attending physician today.  Patient wife verbalized her appreciation for CSW support and concern.

## 2012-07-10 NOTE — Progress Notes (Addendum)
Patient with 9 beat and subsequent 6 beat run of Vtach approximately 3 minutes later. Patient with no s/sx of distress. Neurology paged. No response. Patient's HR has been ST 115-120 since. Will continue to monitor.   Dr. Amada Jupiter called back around 2300. No new orders received.

## 2012-07-11 ENCOUNTER — Inpatient Hospital Stay (HOSPITAL_COMMUNITY): Payer: Medicare Other

## 2012-07-11 DIAGNOSIS — J690 Pneumonitis due to inhalation of food and vomit: Secondary | ICD-10-CM

## 2012-07-11 DIAGNOSIS — R509 Fever, unspecified: Secondary | ICD-10-CM | POA: Diagnosis not present

## 2012-07-11 DIAGNOSIS — E870 Hyperosmolality and hypernatremia: Secondary | ICD-10-CM

## 2012-07-11 LAB — GLUCOSE, CAPILLARY
Glucose-Capillary: 173 mg/dL — ABNORMAL HIGH (ref 70–99)
Glucose-Capillary: 273 mg/dL — ABNORMAL HIGH (ref 70–99)
Glucose-Capillary: 276 mg/dL — ABNORMAL HIGH (ref 70–99)

## 2012-07-11 LAB — BASIC METABOLIC PANEL
BUN: 89 mg/dL — ABNORMAL HIGH (ref 6–23)
Chloride: >130 mEq/L (ref 96–112)
Creatinine, Ser: 2.48 mg/dL — ABNORMAL HIGH (ref 0.50–1.35)
Glucose, Bld: 410 mg/dL — ABNORMAL HIGH (ref 70–99)
Potassium: 4.3 mEq/L (ref 3.5–5.1)

## 2012-07-11 MED ORDER — ALBUTEROL SULFATE (5 MG/ML) 0.5% IN NEBU
2.5000 mg | INHALATION_SOLUTION | Freq: Four times a day (QID) | RESPIRATORY_TRACT | Status: DC
  Administered 2012-07-11 – 2012-07-12 (×3): 2.5 mg via RESPIRATORY_TRACT
  Filled 2012-07-11 (×3): qty 0.5

## 2012-07-11 MED ORDER — FREE WATER
400.0000 mL | Freq: Four times a day (QID) | Status: DC
  Administered 2012-07-12 (×3): 400 mL

## 2012-07-11 MED ORDER — VANCOMYCIN HCL IN DEXTROSE 1-5 GM/200ML-% IV SOLN
1000.0000 mg | INTRAVENOUS | Status: AC
  Administered 2012-07-11 – 2012-07-12 (×2): 1000 mg via INTRAVENOUS
  Filled 2012-07-11 (×2): qty 200

## 2012-07-11 MED ORDER — INSULIN GLARGINE 100 UNIT/ML ~~LOC~~ SOLN
20.0000 [IU] | Freq: Every day | SUBCUTANEOUS | Status: DC
  Administered 2012-07-11 – 2012-07-12 (×2): 20 [IU] via SUBCUTANEOUS
  Filled 2012-07-11 (×2): qty 0.2

## 2012-07-11 MED ORDER — METOPROLOL TARTRATE 1 MG/ML IV SOLN
5.0000 mg | Freq: Four times a day (QID) | INTRAVENOUS | Status: DC
  Administered 2012-07-11 – 2012-07-17 (×23): 5 mg via INTRAVENOUS
  Filled 2012-07-11 (×27): qty 5

## 2012-07-11 MED ORDER — GLUCERNA 1.2 CAL PO LIQD
1000.0000 mL | ORAL | Status: DC
  Administered 2012-07-11 – 2012-07-16 (×4): 1000 mL
  Filled 2012-07-11 (×11): qty 1000

## 2012-07-11 MED ORDER — BISACODYL 10 MG RE SUPP
10.0000 mg | Freq: Once | RECTAL | Status: AC
  Administered 2012-07-11: 10 mg via RECTAL
  Filled 2012-07-11: qty 1

## 2012-07-11 MED ORDER — METOPROLOL TARTRATE 1 MG/ML IV SOLN
5.0000 mg | Freq: Once | INTRAVENOUS | Status: AC
  Administered 2012-07-11: 5 mg via INTRAVENOUS
  Filled 2012-07-11: qty 5

## 2012-07-11 MED ORDER — PIPERACILLIN-TAZOBACTAM 3.375 G IVPB
3.3750 g | Freq: Three times a day (TID) | INTRAVENOUS | Status: DC
  Administered 2012-07-11 – 2012-07-15 (×12): 3.375 g via INTRAVENOUS
  Filled 2012-07-11 (×14): qty 50

## 2012-07-11 MED ORDER — DEXTROSE 5 % IV SOLN
INTRAVENOUS | Status: DC
  Administered 2012-07-11 – 2012-07-16 (×5): via INTRAVENOUS

## 2012-07-11 NOTE — Clinical Social Work Note (Signed)
CSW met with pt family at bedside.  Present was pt, pt wife, pt son and pt wife's daughter.  Pt reviewed SNF list and was interested in [facility].  Kelly from [place] came by to introduce herself and answer questions the family may have.  The family was appreciative.  The family is agreeable to removing NG tube and placing PEG tube.  CSW notified RNCM.  CSW reviewed bed offers with pt wife and pt children.  CSW answered general questions and encouraged family to visit facilities while they had time.  Family was agreeable to touring facilities and giving CSW choice by Friday.  CSW will continue to follow.  Vickii Penna, LCSWA [phone]  Clinical Social Work

## 2012-07-11 NOTE — Progress Notes (Signed)
Patient ID: [name], male   DOB: 1955/05/18, 57 y.o.   MRN: [id number]   Request acknowledged for percutaneous gastric tube placement CVA; malnutrition  Ucx + Awaiting BCs  Wbc 28.4 7/1 Need wnl or trending way down   T max: 100.8 lats pm Need afeb x 48 hrs  Will keep on radar in IR  IR PA will see pt and prepare for procedure when appropriate

## 2012-07-11 NOTE — Progress Notes (Signed)
Per NP, ok to restart tube feedings. Also notified NP that Patient had 10 BRVT. MD ordered BMET & Mg+ labs, and 5mg  IV metoprolol one time.

## 2012-07-11 NOTE — Progress Notes (Signed)
NUTRITION CONSULT/FOLLOW UP  Intervention:   1. D/c current Jevity 1.2 orders.  2. Start Glucerna 1.2 @ 45 ml/hr and advance by 10 ml q 4 hr to a goal rate of 60 ml/hr.  3. Continue Pro-stat 30 ml BID.  This new enteral regimen will provide 1928 kcal, 116 gm protein and 1159 ml free water.  4. Continue free water flushes, 300 ml q 6 hrs to provide a total of 2359 ml free water per day.   Nutrition Dx:   Inadequate oral intake related to inability to eat as evidenced by NPO status, ongoing  Goal:   TF to meet > 90% of estimated nutrition needs, met  Monitor:   TF regimen & tolerance, weight, labs, I/O's  Assessment:   Patient with left CMA, received t-PA, CT on 6/13 showed new left ICH with midline shift.  Patient extubated 6/27.  Transferred out of Neuro ICU.    Comfort care presented to wife and family who have decided on continued aggressive care. Planned for PEG placement once medically able and d/c to SNF at d/c. RD consulted for "carb modified" tube feedings. Currently has a NG tube in place with Jevity 1.2 infusing at 45 ml/hr and 30 ml Pro-stat BID. This is providing 1496 kcal, 90 gm protein, and 871 ml free water. Doubt that changing TF will make a significant difference in blood sugars, current TF is providing 182 gm carbohydrates daily, at goal rate Glucerna 1.2 will provide 165 gm.    Height: Ht Readings from Last 1 Encounters:  06/22/12 5\' 11"  (1.803 m)    Weight Status  Wt Readings from Last 1 Encounters:  07/11/12 153 lb 7 oz (69.6 kg)    Re-estimated needs:  Kcal: 1800-2000 Protein: 90-110 gm Fluid: 1.8-2.0 L  Skin: Intact  Diet Order: NPO   Intake/Output Summary (Last 24 hours) at 07/11/12 1215 Last data filed at 07/11/12 1109  Gross per 24 hour  Intake   1260 ml  Output   1700 ml  Net   -440 ml    Last BM: 6/29  Labs:   Recent Labs Lab 07/05/12 0500 07/06/12 0545 07/08/12 1412 07/09/12 1015 07/10/12 1454  NA 157* 152* 160* 168* 170*   K 4.1 4.4 4.7 4.7 4.3  CL 119* 114* 123* 129* >130*  CO2 28 27 23 21 21   BUN 55* 61* 73* 92* 96*  CREATININE 1.95* 2.01* 2.47* 2.83* 2.50*  CALCIUM 9.0 8.7 9.1 9.2 9.2  MG 2.5 2.6*  --   --   --   PHOS 4.1 4.6  --   --   --   GLUCOSE 209* 287* 159* 299* 459*    CBG (last 3)   Recent Labs  07/11/12 0412 07/11/12 0810 07/11/12 1142  GLUCAP 272* 293* 313*   Lab Results  Component Value Date   HGBA1C 6.2* 06/24/2012    Scheduled Meds: . albuterol  2.5 mg Nebulization QID  . antiseptic oral rinse  15 mL Mouth Rinse QID  . chlorhexidine  15 mL Mouth Rinse BID  . cloNIDine  0.2 mg Transdermal Weekly  . feeding supplement  30 mL Per Tube BID  . free water  300 mL Per Tube Q6H  . insulin aspart  0-9 Units Subcutaneous Q4H  . insulin glargine  20 Units Subcutaneous Daily  . levofloxacin (LEVAQUIN) IV  750 mg Intravenous Q48H  . metoprolol  2.5 mg Intravenous Q6H  . phenytoin (DILANTIN) IV  100 mg Intravenous Q8H  .  sodium chloride  10-40 mL Intracatheter Q12H    Continuous Infusions: . dextrose 80 mL/hr at 07/11/12 1114  . feeding supplement (JEVITY 1.2 CAL) 1,000 mL (07/10/12 1800)  . niCARDipine Stopped (07/04/12 0915)   Clarene Duke RD, LDN Pager [phone] After Hours pager [phone]

## 2012-07-11 NOTE — Progress Notes (Signed)
ANTIBIOTIC CONSULT NOTE - INITIAL  Pharmacy Consult for vancomycin and zosyn Indication: rule out pneumonia  No Known Allergies  Patient Measurements: Height: 5\' 11"  (180.3 cm) Weight: 153 lb 7 oz (69.6 kg) IBW/kg (Calculated) : 75.3   Vital Signs: Temp: 98.6 F (37 C) (07/02 0921) Temp src: Axillary (07/02 0921) BP: 152/91 mmHg (07/02 0921) Pulse Rate: 90 (07/02 0921) Intake/Output from previous day: 07/01 0701 - 07/02 0700 In: 1260 [NG/GT:1260] Out: 450 [Urine:450] Intake/Output from this shift: Total I/O In: -  Out: 1250 [Urine:1250]  Labs:  Recent Labs  07/08/12 1412 07/09/12 1015 07/10/12 1454  WBC 20.8*  --  28.4*  HGB 13.4  --  13.6  PLT 323  --  270  CREATININE 2.47* 2.83* 2.50*   Estimated Creatinine Clearance: 32.1 ml/min (by C-G formula based on Cr of 2.5). No results found for this basename: VANCOTROUGH, Leodis Binet, VANCORANDOM, GENTTROUGH, GENTPEAK, GENTRANDOM, TOBRATROUGH, TOBRAPEAK, TOBRARND, AMIKACINPEAK, AMIKACINTROU, AMIKACIN,  in the last 72 hours   Microbiology: Recent Results (from the past 720 hour(s))  MRSA PCR SCREENING     Status: None   Collection Time    06/21/12  9:46 PM      Result Value Range Status   MRSA by PCR NEGATIVE  NEGATIVE Final   Comment:            The GeneXpert MRSA Assay (FDA     approved for NASAL specimens     only), is one component of a     comprehensive MRSA colonization     surveillance program. It is not     intended to diagnose MRSA     infection nor to guide or     monitor treatment for     MRSA infections.  CULTURE, RESPIRATORY (NON-EXPECTORATED)     Status: None   Collection Time    06/24/12 11:50 AM      Result Value Range Status   Specimen Description TRACHEAL ASPIRATE   Final   Special Requests NONE   Final   Gram Stain     Final   Value: FEW WBC PRESENT,BOTH PMN AND MONONUCLEAR     RARE SQUAMOUS EPITHELIAL CELLS PRESENT     FEW GRAM POSITIVE COCCI     IN PAIRS   Culture     Final   Value:  ABUNDANT STAPHYLOCOCCUS AUREUS     Note: RIFAMPIN AND GENTAMICIN SHOULD NOT BE USED AS SINGLE DRUGS FOR TREATMENT OF STAPH INFECTIONS.   Report Status 06/27/2012 FINAL   Final   Organism ID, Bacteria STAPHYLOCOCCUS AUREUS   Final  URINE CULTURE     Status: None   Collection Time    07/08/12  3:00 PM      Result Value Range Status   Specimen Description URINE, RANDOM   Final   Special Requests NONE   Final   Culture  Setup Time 07/08/2012 21:02   Final   Colony Count >=100,000 COLONIES/ML   Final   Culture GRAM NEGATIVE RODS   Final   Report Status PENDING   Incomplete  CULTURE, BLOOD (ROUTINE X 2)     Status: None   Collection Time    07/10/12  2:45 PM      Result Value Range Status   Specimen Description BLOOD RIGHT HAND   Final   Special Requests BOTTLES DRAWN AEROBIC ONLY 4CC   Final   Culture  Setup Time 07/10/2012 20:49   Final   Culture     Final  Value:        BLOOD CULTURE RECEIVED NO GROWTH TO DATE CULTURE WILL BE HELD FOR 5 DAYS BEFORE ISSUING A FINAL NEGATIVE REPORT   Report Status PENDING   Incomplete  CULTURE, BLOOD (ROUTINE X 2)     Status: None   Collection Time    07/10/12  2:50 PM      Result Value Range Status   Specimen Description BLOOD LEFT HAND   Final   Special Requests BOTTLES DRAWN AEROBIC ONLY 4CC   Final   Culture  Setup Time 07/10/2012 20:49   Final   Culture     Final   Value:        BLOOD CULTURE RECEIVED NO GROWTH TO DATE CULTURE WILL BE HELD FOR 5 DAYS BEFORE ISSUING A FINAL NEGATIVE REPORT   Report Status PENDING   Incomplete    Medical History: Past Medical History  Diagnosis Date  . Stroke 2007  . Hypertension   . Difficult airway for intubation 06/22/2012    Deep and anterior. Difficult with standard laryngoscope. Easily intubated with Glidescope   . Coronary artery disease     Stent placement    Medications:  Scheduled:  . albuterol  2.5 mg Nebulization QID  . antiseptic oral rinse  15 mL Mouth Rinse QID  . chlorhexidine  15 mL  Mouth Rinse BID  . cloNIDine  0.2 mg Transdermal Weekly  . feeding supplement  30 mL Per Tube BID  . free water  300 mL Per Tube Q6H  . insulin aspart  0-9 Units Subcutaneous Q4H  . insulin glargine  20 Units Subcutaneous Daily  . levofloxacin (LEVAQUIN) IV  750 mg Intravenous Q48H  . metoprolol  5 mg Intravenous Q6H  . phenytoin (DILANTIN) IV  100 mg Intravenous Q8H  . piperacillin-tazobactam (ZOSYN)  IV  3.375 g Intravenous Q8H  . sodium chloride  10-40 mL Intracatheter Q12H  . vancomycin  1,000 mg Intravenous Q24H   Assessment: 57 yo to start broad spectrum antibiotics r/o PNA.  Goal of Therapy:  Vancomycin trough level 15-20 mcg/ml  Plan:  Vancomycin 1 gm IV q24 hours Zosyn 3.375 gm IV q8 hours F/u clinical course, cultures and renal function Check vanc trough when appropriate  [name] 07/11/2012,12:51 PM

## 2012-07-11 NOTE — Progress Notes (Signed)
Called to patient's room. Pt was moving head back and forth and NGT was pulled out approximately 3-4 inches. Stopped Tube feed.  Pushed NGT back and reinforced tape to nose. Auscultated air and aspirated. Paged MD to see if ok to resume feedings or if another CXR is needed.

## 2012-07-11 NOTE — Progress Notes (Addendum)
Stroke Team Progress Note  HISTORY Glenda Kunst is a 57 y.o. 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 No focal weakness was noted. The  patient had been taking aspirin 81 mg per day. CT scan showed no acute intracranial abnormality. NIH stroke score was 8. Patient had marked expressive aphasia as well as gaze preference to the left side and right visual field defect to visual confrontation. He was deemed a candidate for intravenous thrombolytic therapy with TPA.   LSN: [id number] on 06/21/2012  tPA Given: Yes  MRankin: 2  SUBJECTIVE. Wife has left the bedside about 30-45 mins ago. Son is at the bedside. He states he  Understands the family cannot provide care at home.  OBJECTIVE Most recent Vital Signs: Filed Vitals:   07/10/12 2121 07/11/12 0200 07/11/12 0600 07/11/12 0921  BP: 157/96 163/94 156/88 152/91  Pulse: 120 95 88 90  Temp: 100.8 F (38.2 C) 99.3 F (37.4 C) 98.9 F (37.2 C) 98.6 F (37 C)  TempSrc:    Axillary  Resp: 22 24 24 20   Height:      Weight:   69.6 kg (153 lb 7 oz)   SpO2: 97% 99% 93% 100%   CBG (last 3)   Recent Labs  07/11/12 0006 07/11/12 0412 07/11/12 0810  GLUCAP 276* 272* 293*    IV Fluid Intake:   . sodium chloride 125 mL/hr at 07/11/12 0416  . feeding supplement (JEVITY 1.2 CAL) 1,000 mL (07/10/12 1800)  . niCARDipine Stopped (07/04/12 0915)    MEDICATIONS  . antiseptic oral rinse  15 mL Mouth Rinse QID  . chlorhexidine  15 mL Mouth Rinse BID  . cloNIDine  0.2 mg Transdermal Weekly  . feeding supplement  30 mL Per Tube BID  . free water  300 mL Per Tube Q6H  . insulin aspart  0-9 Units Subcutaneous Q4H  . levofloxacin (LEVAQUIN) IV  750  mg Intravenous Q48H  . metoprolol  2.5 mg Intravenous Q6H  . phenytoin (DILANTIN) IV  100 mg Intravenous Q8H  . sodium chloride  10-40 mL Intracatheter Q12H   PRN:  acetaminophen, acetaminophen, artificial tears, fentaNYL, labetalol, LORazepam, metoprolol  Diet:  NPO tube feeds  Activity:  Bedrest DVT Prophylaxis:  SCD  CLINICALLY SIGNIFICANT STUDIES Basic Metabolic Panel:   Recent Labs Lab 07/05/12 0500 07/06/12 0545  07/09/12 1015 07/10/12 1454  NA 157* 152*  < > 168* 170*  K 4.1 4.4  < > 4.7 4.3  CL 119* 114*  < > 129* >130*  CO2 28 27  < > 21 21  GLUCOSE 209* 287*  < > 299* 459*  BUN 55* 61*  < > 92* 96*  CREATININE 1.95* 2.01*  < > 2.83* 2.50*  CALCIUM 9.0 8.7  < > 9.2 9.2  MG 2.5 2.6*  --   --   --   PHOS 4.1 4.6  --   --   --   < > = values in this interval not displayed. Liver Function Tests:   Recent Labs Lab 07/08/12 1412  AST 338*  ALT 138*  ALKPHOS 135*  BILITOT 0.4  PROT 8.5*  ALBUMIN 2.3*   CBC:   Recent Labs Lab 07/08/12 1412 07/10/12 1454  WBC 20.8* 28.4*  NEUTROABS 18.3*  --   HGB 13.4 13.6  HCT 45.2 47.1  MCV 68.5* 70.7*  PLT 323 270   Coagulation:  No results found for this basename: LABPROT, INR,  in the last 168 hours Cardiac Enzymes:  No results found for this basename: CKTOTAL, CKMB, CKMBINDEX, TROPONINI,  in the last 168 hours Urinalysis:   Recent Labs Lab 07/08/12 1500 07/10/12 1742  COLORURINE YELLOW AMBER*  LABSPEC 1.025 1.025  PHURINE 5.0 5.0  GLUCOSEU NEGATIVE >1000*  HGBUR LARGE* LARGE*  BILIRUBINUR NEGATIVE NEGATIVE  KETONESUR NEGATIVE NEGATIVE  PROTEINUR >300* 100*  UROBILINOGEN 0.2 0.2  NITRITE NEGATIVE NEGATIVE  LEUKOCYTESUR SMALL* NEGATIVE   Lipid Panel    Component Value Date/Time   CHOL 185 06/22/2012 0525   TRIG 294* 06/28/2012 0600   HDL 58 06/22/2012 0525   CHOLHDL 3.2 06/22/2012 0525   VLDL 18 06/22/2012 0525   LDLCALC 109* 06/22/2012 0525   HgbA1C  Lab Results  Component Value Date   HGBA1C  6.2* 06/24/2012   Urine Drug Screen:   No results found for this basename: labopia,  cocainscrnur,  labbenz,  amphetmu,  thcu,  labbarb    Alcohol Level: No results found for this basename: ETH,  in the last 168 hours  Ct Head Wo Contrast 07/03/2012 No significant change. Blood in the occipital horn of the right lateral ventricle is more apparent. Midline shift is essentially unchanged. No new hemorrhage on the left.  06/26/2012    Overall slight improvement in the degree of midline shift compared with priors.  No new areas of infarction.  No convincing signs for trapped right lateral ventricle on today's exam.  Stable multifocal predominantly left MCA territory infarcts with hemorrhagic transformation; no new infarcts or new lobar hemorrhage from priors.     06/24/2012    Large left parietal - frontal - posterior left temporal lobe hemorrhagic infarct and mass effect upon the left lateral ventricle relatively similar to the recent exam.  Interval development of interventricular blood within the tract right lateral ventricle.  Subarachnoid blood most notable right convexity and right sylvian fissure more apparent than on the prior exam.  Bilateral anterior frontal lobe infarcts greater on the left with minimal associated petechial hemorrhage.    06/23/2012   Minimal change in the large left hemorrhagic infarct.  Minimal change in the midline shift.   06/22/2012    Image quality degraded by significant motion.  There is a large area of hemorrhage in the left parietal lobe with mass effect and 10 mm midline shift.  This is most consistent with hemorrhagic infarction following TPA.     06/22/2012   Ct Portable Head W/o Cm Large area of hemorrhagic infarction left parietal lobe is similar. There is improvement in blood in the corpus collosum/ caudate on the left.  8 mm midline shift to the right without hydrocephalus.    06/21/2012    Chronic ischemic changes, right greater than left.  No acute abnormality.     MRI of the brain    MRA of the brain    2D Echocardiogram  ejection fraction was in the range of 60% to 65%. Possible mild hypokinesis of the apical myocardium. - Aortic valve: Trivial regurgitation. - Mitral valve: Moderate regurgitation. - Left atrium: The atrium was mildly dilated. - Right ventricle: There was moderate hypertrophy. - Right atrium: The atrium was mildly dilated. - Pulmonary arteries: Systolic pressure was mildly  Carotid Doppler  Technically limited due to right IJ line and  patient immobility. No evidence of significant ICA stenosis noted. Antegrade vertebral flow.  CXR   07/08/2012 Interval improvement in the atelectasis at the bases. Status post  removal of support apparatus. No additional interval changes. 07/03/2012 Bibasilar atelectasis. 07/01/2012   Improved aeration bilaterally.    06/27/2012    Improved bibasilar aeration.  06/26/2012   1.  Low inspiratory volumes with new patchy right basilar opacity. Differential considerations include developing infiltrate, asymmetric pulmonary edema, and potentially atelectasis although this is less likely given the imaging appearance. 2.  Stable left retrocardiac opacity may reflect atelectasis or infiltrate. 3.  Pulmonary vascular congestion bordering on mild interstitial edema.    06/24/2012   Stable cardiomegaly.  No acute findings.    06/23/2012  1.  Nasogastric tube placement least as far as the stomach. 2.  Interval increase in bilateral edema or infiltrates.   06/22/2012  Tip of right jugular line projects over SVC without pneumothorax. Bibasilar atelectasis.   Original Report Authenticated By: Ulyses Southward, M.D.  06/21/2012   Moderate cardiomegaly.  No active disease.   EKG  normal sinus rhythm.   EEG no epileptiform activity  Therapy Recommendations    Physical Exam  General - the patient is resting comfortably. He coughs occasionally. Heart - Regular rate and rhythm - no murmer Lungs - diffuse rhonchi Abdomen -  Soft - non tender Extremities - Distal pulses intact - no edema Skin - Warm and dry   Neurologic Exam  Mental Status: Sleeping - difficult to arouse. Opens eyes spontaneously. Does not track.  Not following commands.  Cranial nerves: pupils are symmetric.   eyes  are open bilaterally, no blink to threat from either side. The patient is non-verbal. He will not follow verbal commands.  Motor:  Mild withdrawal to pain in all 4 limbs but no spontaneous movements.  Reflexes: SLIGHTLY BRISK IN LUE AND LLE.   Coordination and gait: unable to test.  ASSESSMENT [name] is a 57 y.o. male presenting with expressive aphasia with unintelligible speech output. He is status post IV t-PA 06/21/2012 at 20:17. Symptoms were consistent with left middle cerebral artery acute stroke.  Imaging confirmed a large left parietal - frontal - posterior left temporal lobe ischemic infarct with old right frontal infarct.  Followup CT repeated due to acute delirium, tonic-clonic seizure activity in left arm, decreasing consciousness. That scan 06/22/2012 shows that there is a large 6 cm  area of hemorrhagic transformation in the left parietal lobe with cerebral edema - mass effect upon the left lateral ventricle and 10 mm midline shift.  Patient was subsequently intubated for airway protection. 3% saline started to decrease cytotoxic edema. F/u CT shows interval development of interventricular blood within the tract right lateral ventricle and subarachnoid blood most notable right convexity and right sylvian fissure  Initial infarcts felt to be embolic, source most likely cardioembolic from acute MI. On no antithrombotics prior to admission. Now on no antithrombitics due to hemorrhage.  Patient with resultant VDRF - now extubated, stuporous, global aphasia, right hemiparesis, dysphagia, left arm decorticate.   Hypernatremia, induced with 3% Saline to decrease cerebral edema -  sodium dropped, now has increased to  170 in the setting of elevated bun and creatinine and saline at 125 cc/hour was added last night  Dehydration.   Hyperlipidemia, LDL 109, not on statin prior to admission, on atorvastatin now  Chronic kidney disease, stage 3 worsening renal failure.  Malignant Hypertension, on admission, changing to medications and weaning off  cardene with SBP goal  < 180,  norvasc increased and added to beta-blocker regimen. Clonidine added.  Possible NSTEMI with Troponin I normal on admission, elevation to 11.15 [facility], Dr Algie Coffer consulted, not a candidate for cardiac intervention until stabilizes.   Tachycardia, resolved. Placed On lopressor 100mg  total daily.   Seizure-like activity with hemorrhage, EEG without epileptiform activity. On dilantin.  Right basilar pneumonia, MSSA, treated with ancef   leukocutosis and fever 102  currently on Levaquin  Hospital day # 20  TREATMENT/PLAN  Discontinue saline. Replaced with D5W   Internal medicine consult to address medical issues  Patient is DNR.   Will place PEG in radiology - it is on hold until medical issues are stabilized. They are following  Child psychotherapist for placement   D/W son at bedside and answered questions.  Annie Main, MSN, RN, ANVP-BC, ANP-BC, [facility] Pager: [phone] 07/11/2012 10:53 AM  I have personally obtained a history, examined the patient, evaluated imaging results, and formulated the assessment and plan of care. I agree with the above. Delia Heady, MD

## 2012-07-11 NOTE — Consult Note (Addendum)
[facility] Medical Consultation  [name] WUJ:[id number] DOB: 10/16/1955 DOA: 06/21/2012 PCP: Erlinda Hong, MD   Requesting physician: Stroke Team Date of consultation: 07/11/12 Reason for consultation: fevers, hypernatremia  Impression/Recommendations  1. Large embolic CVA w/ hemorrhagic transformation with resultant VDRF, global aphasia, right hemiparesis, dysphagia -  Overall prognosis remains extremely poor -  Consider a palliative consultation for goals of care -  Would be a good LTAC candidate if hospice not felt to be an option -  PEG per IR once fever, infection controlled  2. Fevers/leukocytosis -Suspect this is related to recurrent aspiration -Lung exam with scattered rhonchi and rales throughout -Completed a ten-day course of IV Ancef on 6/26 for MSSA pneumonia -Repeat chest x-ray today -Start IV vancomycin and Zosyn -Recurrent aspiration of on secretions will continue to be an ongoing long-term challenge. -Pulmonary toilet, suction PRN, add albuterol nebs -Also has gram-negative rods in his urine, Zosyn should cover this as well, FU cultures and sensitivity -Follow up blood cultures drawn on 7/1  3. Hypernatremia -Secondary to hypertonic saline and free water deficits -Agree with D5 infusion, increase rate to 100 cc an hour, -increase free water - follow up Bmet Q 12 hours  4. Hyperglycemia -Add Lantus, sliding scale insulin -Dietary consult, for carb limited options for tube feeds  5. Recent NSTEMI -Seen by Dr. Algie Coffer, not a candidate for intervention given 1, echo with normal EF and mild apical hypokinesis -Continue IV Lopressor  CODE STATUS: DO NOT RESUSCITATE  Will followup again tomorrow. Please contact me if I can be of assistance in the meanwhile. Thank you for this consultation.  Chief Complaint: Acute onset of confusion and inability to speak   HPI: [name] is an 57 y.o. male history of previous right cortical stroke in 2009, CAD s/p  PCI, hypertension, right nephrosis and status post stent placement, CKD was admitted to [facility] service on 6/12 after he presented with  who developed acute onset of inability to speak and confusion ,  CT scan showed no acute intracranial abnormality. NIH stroke score was 8. Patient had marked expressive aphasia as well as gaze preference to the left side and right visual field defect to visual confrontation. He was administered TPA, subsequent hospitalization complicated by hemorrhagic transformation of CVA, interventricular hemorrhage, subarachnoid blood with midline shift, seizures , he was then intubated for airway protection and subsequently extubated on 6/26. In the internal was also completed a ten-day course of IV Ancef for MSSA pneumonia on 6/26. He was also started on 3% for cerebral edema which was then stopped several days ago however continues to have persistent worsening hyponatremia and fevers at this point. Overnight was given some normal saline and IV Lopressor for nonsustained V. Tach and fevers.    Review of Systems:  12 system review completed, positive for cough, fevers otherwise negative  Past Medical History  Diagnosis Date  . Stroke 2007  . Hypertension   . Difficult airway for intubation 06/22/2012    Deep and anterior. Difficult with standard laryngoscope. Easily intubated with Glidescope   . Coronary artery disease     Stent placement   Past Surgical History  Procedure Laterality Date  . Coronary stent placement    . Cardiac surgery     Social History:  reports that he has never smoked. He has never used smokeless tobacco. He reports that he does not drink alcohol. His drug history is not on file.  No Known Allergies History reviewed. No pertinent family history.  Prior  to Admission medications   Medication Sig Start Date End Date Taking? Authorizing Provider  atenolol (TENORMIN) 25 MG tablet Take 25 mg by mouth daily.   Yes Historical Provider, MD   hydrochlorothiazide (HYDRODIURIL) 25 MG tablet Take 25 mg by mouth daily.   Yes Historical Provider, MD  losartan (COZAAR) 100 MG tablet Take 100 mg by mouth daily.   Yes Historical Provider, MD  sildenafil (VIAGRA) 100 MG tablet Take 100 mg by mouth daily as needed for erectile dysfunction.   Yes Historical Provider, MD   Physical Exam: Blood pressure 152/91, pulse 90, temperature 98.6 F (37 C), temperature source Axillary, resp. rate 20, height 5\' 11"  (1.803 m), weight 69.6 kg (153 lb 7 oz), SpO2 100.00%. Filed Vitals:   07/10/12 2121 07/11/12 0200 07/11/12 0600 07/11/12 0921  BP: 157/96 163/94 156/88 152/91  Pulse: 120 95 88 90  Temp: 100.8 F (38.2 C) 99.3 F (37.4 C) 98.9 F (37.2 C) 98.6 F (37 C)  TempSrc:    Axillary  Resp: 22 24 24 20   Height:      Weight:   69.6 kg (153 lb 7 oz)   SpO2: 97% 99% 93% 100%     General:  Obtunded, opens eyes, not tracking  NGT in situ  HEENT: pupils reactive and symmetric  Cardiovascular: S1S2/tachycardic  Respiratory: scattered ronchi throughout both lung fields, coarse BS B/L  Abdomen: soft, Nt, BS present  Skin:no rashes  Musculoskeletal: no edema c/c  Psychiatric:unable to assess  Neurologic:Eyes open, not tracking, does not follow any commands, more flaccid on R  Reflexes brisk in LLE  Labs on Admission:  Basic Metabolic Panel:  Recent Labs Lab 07/05/12 0500 07/06/12 0545 07/08/12 1412 07/09/12 1015 07/10/12 1454  NA 157* 152* 160* 168* 170*  K 4.1 4.4 4.7 4.7 4.3  CL 119* 114* 123* 129* >130*  CO2 28 27 23 21 21   GLUCOSE 209* 287* 159* 299* 459*  BUN 55* 61* 73* 92* 96*  CREATININE 1.95* 2.01* 2.47* 2.83* 2.50*  CALCIUM 9.0 8.7 9.1 9.2 9.2  MG 2.5 2.6*  --   --   --   PHOS 4.1 4.6  --   --   --    Liver Function Tests:  Recent Labs Lab 07/08/12 1412  AST 338*  ALT 138*  ALKPHOS 135*  BILITOT 0.4  PROT 8.5*  ALBUMIN 2.3*   No results found for this basename: LIPASE, AMYLASE,  in the last 168  hours No results found for this basename: AMMONIA,  in the last 168 hours CBC:  Recent Labs Lab 07/05/12 0500 07/06/12 0545 07/08/12 1412 07/10/12 1454  WBC 14.9* 13.7* 20.8* 28.4*  NEUTROABS  --   --  18.3*  --   HGB 11.8* 11.5* 13.4 13.6  HCT 40.1 39.4 45.2 47.1  MCV 66.6* 67.0* 68.5* 70.7*  PLT 313 296 323 270   Cardiac Enzymes: No results found for this basename: CKTOTAL, CKMB, CKMBINDEX, TROPONINI,  in the last 168 hours BNP: No components found with this basename: POCBNP,  CBG:  Recent Labs Lab 07/10/12 1939 07/11/12 0006 07/11/12 0412 07/11/12 0810 07/11/12 1142  GLUCAP 389* 276* 272* 293* 313*    Radiological Exams on Admission: Dg Chest Port 1 View  07/10/2012   *RADIOLOGY REPORT*  Clinical Data: Congestion, gurgling noises, question pneumonia, history hypertension, stroke, coronary artery disease  PORTABLE CHEST - 1 VIEW  Comparison: Portable exam 1116 hours compared to 07/08/2012  Findings: Nasogastric tube extends into stomach. Upper normal  heart size. Prominent superior mediastinum unchanged. Mediastinal contours and pulmonary vascularity otherwise normal with mild tortuosity thoracic aorta noted. Lungs clear. Minimal peribronchial thickening. No pleural effusion or pneumothorax. Bones unremarkable.  IMPRESSION: Prominent superior mediastinum, unchanged since prior exams. Minimal bronchitic changes without infiltrate.   Original Report Authenticated By: Ulyses Southward, M.D.   Time spent:  [facility] Pager [phone]  If 7PM-7AM, please contact night-coverage [email or web address] Password [facility] 07/11/2012, 12:08 PM

## 2012-07-12 LAB — CBC
HCT: 44.1 % (ref 39.0–52.0)
Hemoglobin: 12.8 g/dL — ABNORMAL LOW (ref 13.0–17.0)
MCH: 20.1 pg — ABNORMAL LOW (ref 26.0–34.0)
MCV: 69.3 fL — ABNORMAL LOW (ref 78.0–100.0)
MCV: 70 fL — ABNORMAL LOW (ref 78.0–100.0)
Platelets: 169 10*3/uL (ref 150–400)
RBC: 6.36 MIL/uL — ABNORMAL HIGH (ref 4.22–5.81)
RDW: 24.4 % — ABNORMAL HIGH (ref 11.5–15.5)
WBC: 18.6 10*3/uL — ABNORMAL HIGH (ref 4.0–10.5)

## 2012-07-12 LAB — BASIC METABOLIC PANEL
CO2: 19 mEq/L (ref 19–32)
Chloride: >130 mEq/L (ref 96–112)
Chloride: >130 mEq/L (ref 96–112)
Creatinine, Ser: 2.56 mg/dL — ABNORMAL HIGH (ref 0.50–1.35)
Creatinine, Ser: 2.75 mg/dL — ABNORMAL HIGH (ref 0.50–1.35)
GFR calc Af Amer: 28 mL/min — ABNORMAL LOW (ref >90)
Potassium: 4.2 mEq/L (ref 3.5–5.1)
Sodium: 169 mEq/L (ref 135–145)

## 2012-07-12 LAB — GLUCOSE, CAPILLARY
Glucose-Capillary: 232 mg/dL — ABNORMAL HIGH (ref 70–99)
Glucose-Capillary: 236 mg/dL — ABNORMAL HIGH (ref 70–99)
Glucose-Capillary: 309 mg/dL — ABNORMAL HIGH (ref 70–99)
Glucose-Capillary: 292 mg/dL — ABNORMAL HIGH (ref 70–99)

## 2012-07-12 MED ORDER — EYE WASH OPHTH SOLN
1.0000 [drp] | Freq: Two times a day (BID) | OPHTHALMIC | Status: DC
  Filled 2012-07-12 (×2): qty 118

## 2012-07-12 MED ORDER — FREE WATER
400.0000 mL | Status: DC
  Administered 2012-07-12 – 2012-07-16 (×26): 400 mL

## 2012-07-12 MED ORDER — ALBUTEROL SULFATE (5 MG/ML) 0.5% IN NEBU
2.5000 mg | INHALATION_SOLUTION | Freq: Two times a day (BID) | RESPIRATORY_TRACT | Status: DC
  Administered 2012-07-12 – 2012-07-14 (×5): 2.5 mg via RESPIRATORY_TRACT
  Filled 2012-07-12 (×5): qty 0.5

## 2012-07-12 MED ORDER — SODIUM CHLORIDE 0.9 % IV SOLN
175.0000 mg | Freq: Two times a day (BID) | INTRAVENOUS | Status: DC
  Administered 2012-07-13 – 2012-07-17 (×10): 175 mg via INTRAVENOUS
  Filled 2012-07-12 (×21): qty 3.5

## 2012-07-12 MED ORDER — INSULIN GLARGINE 100 UNIT/ML ~~LOC~~ SOLN
25.0000 [IU] | Freq: Every day | SUBCUTANEOUS | Status: DC
  Administered 2012-07-13 – 2012-07-14 (×2): 25 [IU] via SUBCUTANEOUS
  Filled 2012-07-12 (×3): qty 0.25

## 2012-07-12 MED ORDER — VANCOMYCIN HCL IN DEXTROSE 750-5 MG/150ML-% IV SOLN
750.0000 mg | INTRAVENOUS | Status: DC
  Administered 2012-07-13 – 2012-07-17 (×5): 750 mg via INTRAVENOUS
  Filled 2012-07-12 (×5): qty 150

## 2012-07-12 NOTE — Progress Notes (Addendum)
Thank you for consulting the Palliative Medicine Team at [facility] to meet your patient's and family's needs.   The reason that you asked [place] to see your patient is for goals of care discussion  We have scheduled your patient for a meeting: Unable to schedule meeting at this time  The Surrogate decision maker is: patient's wife [name] c: [phone]  Other family members that need to be present: patient's children/family  Your patient is able/unable to participate: unable to participate   Additional Narrative: patient seen at bedside unresponsive to voice or gentle touch; no family at bedside -called and spoke with patient's wife Ashok Croon c: [phone]; she wants to contact patient's children/family to see when they can participate in the meeting - wife offered several available times tomorrow, Friday, and over the weekend however she was very hesitant to commit and informed Clinical research associate she works evenings the next 3 nights, 12 hour shifts, starting at 5:30pm - wife stated she will call back to the team phone and leave a message as to when family can meet    Valente David, RN 07/12/2012, 5:04 PM Palliative Medicine Team RN Liaison [phone]

## 2012-07-12 NOTE — Progress Notes (Signed)
ANTIBIOTIC CONSULT NOTE - FOLLOW UP  Pharmacy Consult for Vancomycin + Zosyb Indication: rule out pneumonia  No Known Allergies  Patient Measurements: Height: 5\' 11"  (180.3 cm) Weight: 140 lb 14 oz (63.9 kg) IBW/kg (Calculated) : 75.3  Vital Signs: Temp: 98.5 F (36.9 C) (07/03 0735) Temp src: Oral (07/03 0735) BP: 150/72 mmHg (07/03 1001) Pulse Rate: 82 (07/03 1001) Intake/Output from previous day: 07/02 0701 - 07/03 0700 In: -  Out: 3420 [Urine:3420] Intake/Output from this shift:    Labs:  Recent Labs  07/10/12 1454 07/11/12 1622 07/12/12 0008 07/12/12 0910 07/12/12 1215  WBC 28.4*  --   --  19.3* 18.6*  HGB 13.6  --   --  12.8* 12.5*  PLT 270  --   --  186 169  CREATININE 2.50* 2.48* 2.56*  --  2.75*   Estimated Creatinine Clearance: 26.8 ml/min (by C-G formula based on Cr of 2.75). No results found for this basename: VANCOTROUGH, Leodis Binet, VANCORANDOM, GENTTROUGH, GENTPEAK, GENTRANDOM, TOBRATROUGH, TOBRAPEAK, TOBRARND, AMIKACINPEAK, AMIKACINTROU, AMIKACIN,  in the last 72 hours   Microbiology: Recent Results (from the past 720 hour(s))  MRSA PCR SCREENING     Status: None   Collection Time    06/21/12  9:46 PM      Result Value Range Status   MRSA by PCR NEGATIVE  NEGATIVE Final   Comment:            The GeneXpert MRSA Assay (FDA     approved for NASAL specimens     only), is one component of a     comprehensive MRSA colonization     surveillance program. It is not     intended to diagnose MRSA     infection nor to guide or     monitor treatment for     MRSA infections.  CULTURE, RESPIRATORY (NON-EXPECTORATED)     Status: None   Collection Time    06/24/12 11:50 AM      Result Value Range Status   Specimen Description TRACHEAL ASPIRATE   Final   Special Requests NONE   Final   Gram Stain     Final   Value: FEW WBC PRESENT,BOTH PMN AND MONONUCLEAR     RARE SQUAMOUS EPITHELIAL CELLS PRESENT     FEW GRAM POSITIVE COCCI     IN PAIRS   Culture      Final   Value: ABUNDANT STAPHYLOCOCCUS AUREUS     Note: RIFAMPIN AND GENTAMICIN SHOULD NOT BE USED AS SINGLE DRUGS FOR TREATMENT OF STAPH INFECTIONS.   Report Status 06/27/2012 FINAL   Final   Organism ID, Bacteria STAPHYLOCOCCUS AUREUS   Final  URINE CULTURE     Status: None   Collection Time    07/08/12  3:00 PM      Result Value Range Status   Specimen Description URINE, RANDOM   Final   Special Requests NONE   Final   Culture  Setup Time 07/08/2012 21:02   Final   Colony Count >=100,000 COLONIES/ML   Final   Culture PSEUDOMONAS AERUGINOSA   Final   Report Status PENDING   Incomplete   Organism ID, Bacteria PSEUDOMONAS AERUGINOSA   Final  CULTURE, BLOOD (ROUTINE X 2)     Status: None   Collection Time    07/10/12  2:45 PM      Result Value Range Status   Specimen Description BLOOD RIGHT HAND   Final   Special Requests BOTTLES DRAWN AEROBIC ONLY 4CC  Final   Culture  Setup Time 07/10/2012 20:49   Final   Culture     Final   Value:        BLOOD CULTURE RECEIVED NO GROWTH TO DATE CULTURE WILL BE HELD FOR 5 DAYS BEFORE ISSUING A FINAL NEGATIVE REPORT   Report Status PENDING   Incomplete  CULTURE, BLOOD (ROUTINE X 2)     Status: None   Collection Time    07/10/12  2:50 PM      Result Value Range Status   Specimen Description BLOOD LEFT HAND   Final   Special Requests BOTTLES DRAWN AEROBIC ONLY 4CC   Final   Culture  Setup Time 07/10/2012 20:49   Final   Culture     Final   Value:        BLOOD CULTURE RECEIVED NO GROWTH TO DATE CULTURE WILL BE HELD FOR 5 DAYS BEFORE ISSUING A FINAL NEGATIVE REPORT   Report Status PENDING   Incomplete    Anti-infectives   Start     Dose/Rate Route Frequency Ordered Stop   07/11/12 1300  piperacillin-tazobactam (ZOSYN) IVPB 3.375 g     3.375 g 12.5 mL/hr over 240 Minutes Intravenous Every 8 hours 07/11/12 1250     07/11/12 1300  vancomycin (VANCOCIN) IVPB 1000 mg/200 mL premix     1,000 mg 200 mL/hr over 60 Minutes Intravenous Every 24 hours  07/11/12 1250     07/10/12 2000  levofloxacin (LEVAQUIN) IVPB 750 mg  Status:  Discontinued     750 mg 100 mL/hr over 90 Minutes Intravenous Every 48 hours 07/10/12 1843 07/11/12 1324   06/27/12 2000  ceFAZolin (ANCEF) IVPB 2 g/50 mL premix  Status:  Discontinued     2 g 100 mL/hr over 30 Minutes Intravenous 3 times per day 06/27/12 1758 07/06/12 1015   06/26/12 1100  vancomycin (VANCOCIN) IVPB 750 mg/150 ml premix  Status:  Discontinued     750 mg 150 mL/hr over 60 Minutes Intravenous Every 12 hours 06/26/12 0950 06/27/12 1756      Assessment: 57 y.o. M on Vancomycin + Zosyn for empiric r/o PNA coverage. Fevers/WBC trending down. Tmax/24h: 99.5, WBC 18.6 << 19.3. Renal function has worsened slightly, SCr 2.75 << 2.56, CrCl~25-30 ml/min, requiring a Vancomycin dose adjustment today.  Goal of Therapy:  Vancomycin trough level 15-20 mcg/ml Proper antibiotics for infection/cultures adjusted for renal/hepatic function   Plan:  1. Reduce Vancomycin to 750 mg IV every 24 hours 2. Continue Zosyn 3.375g IV every 8 hours 3. Will continue to follow renal function, culture results, LOT, and antibiotic de-escalation plans   Georgina Pillion, PharmD, BCPS Clinical Pharmacist Pager: [phone] 07/12/2012 2:01 PM

## 2012-07-12 NOTE — Progress Notes (Signed)
Stroke Team Progress Note  HISTORY [name] is a 57 y.o. 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 No focal weakness was noted. The  patient had been taking aspirin 81 mg per day. CT scan showed no acute intracranial abnormality. NIH stroke score was 8. Patient had marked expressive aphasia as well as gaze preference to the left side and right visual field defect to visual confrontation. He was deemed a candidate for intravenous thrombolytic therapy with TPA.   LSN: [id number] on 06/21/2012  tPA Given: Yes  MRankin: 2  SUBJECTIVE. No one currently at bed side. Patient opens eyes to deep stimulation  OBJECTIVE Most recent Vital Signs: Filed Vitals:   07/12/12 0140 07/12/12 0200 07/12/12 0500 07/12/12 0735  BP: 172/56 174/97 159/82 155/86  Pulse: 71 88 97 86  Temp: 98.1 F (36.7 C) 98.6 F (37 C) 98.3 F (36.8 C) 98.5 F (36.9 C)  TempSrc: Oral Oral Oral Oral  Resp: 22 22 22 20   Height:      Weight:   69.9 kg (154 lb 1.6 oz) 63.9 kg (140 lb 14 oz)  SpO2: 98% 98% 97% 100%   CBG (last 3)   Recent Labs  07/11/12 2018 07/11/12 2330 07/12/12 0426  GLUCAP 273* 173* 232*    IV Fluid Intake:   . dextrose 100 mL/hr at 07/11/12 1230  . feeding supplement (GLUCERNA 1.2 CAL) 1,000 mL (07/11/12 1645)    MEDICATIONS  . albuterol  2.5 mg Nebulization BID  . antiseptic oral rinse  15 mL Mouth Rinse QID  . chlorhexidine  15 mL Mouth Rinse BID  . cloNIDine  0.2 mg Transdermal Weekly  . feeding supplement  30 mL Per Tube BID  . free water  400 mL Per Tube Q6H  . insulin aspart  0-9 Units Subcutaneous Q4H  . insulin glargine  20 Units Subcutaneous Daily  . metoprolol  5 mg  Intravenous Q6H  . phenytoin (DILANTIN) IV  100 mg Intravenous Q8H  . piperacillin-tazobactam (ZOSYN)  IV  3.375 g Intravenous Q8H  . sodium chloride  10-40 mL Intracatheter Q12H  . vancomycin  1,000 mg Intravenous Q24H   PRN:  acetaminophen, acetaminophen, artificial tears, fentaNYL, labetalol, LORazepam, metoprolol  Diet:  NPO tube feeds  Activity:  Bedrest DVT Prophylaxis:  SCD  CLINICALLY SIGNIFICANT STUDIES Basic Metabolic Panel:   Recent Labs Lab 07/06/12 0545  07/11/12 1622 07/12/12 0008  NA 152*  < > 171* 170*  K 4.4  < > 4.3 4.9  CL 114*  < > >130* >130*  CO2 27  < > 23 19  GLUCOSE 287*  < > 410* 189*  BUN 61*  < > 89* 83*  CREATININE 2.01*  < > 2.48* 2.56*  CALCIUM 8.7  < > 8.4 8.6  MG 2.6*  --  3.0*  --   PHOS 4.6  --   --   --   < > = values in this interval not displayed. Liver Function Tests:   Recent Labs Lab 07/08/12 1412  AST 338*  ALT 138*  ALKPHOS 135*  BILITOT 0.4  PROT 8.5*  ALBUMIN 2.3*   CBC:   Recent Labs Lab 07/08/12 1412 07/10/12 1454  WBC 20.8* 28.4*  NEUTROABS 18.3*  --  HGB 13.4 13.6  HCT 45.2 47.1  MCV 68.5* 70.7*  PLT 323 270   Coagulation:  No results found for this basename: LABPROT, INR,  in the last 168 hours Cardiac Enzymes:  No results found for this basename: CKTOTAL, CKMB, CKMBINDEX, TROPONINI,  in the last 168 hours Urinalysis:   Recent Labs Lab 07/08/12 1500 07/10/12 1742  COLORURINE YELLOW AMBER*  LABSPEC 1.025 1.025  PHURINE 5.0 5.0  GLUCOSEU NEGATIVE >1000*  HGBUR LARGE* LARGE*  BILIRUBINUR NEGATIVE NEGATIVE  KETONESUR NEGATIVE NEGATIVE  PROTEINUR >300* 100*  UROBILINOGEN 0.2 0.2  NITRITE NEGATIVE NEGATIVE  LEUKOCYTESUR SMALL* NEGATIVE   Lipid Panel    Component Value Date/Time   CHOL 185 06/22/2012 0525   TRIG 294* 06/28/2012 0600   HDL 58 06/22/2012 0525   CHOLHDL 3.2 06/22/2012 0525   VLDL 18 06/22/2012 0525   LDLCALC 109* 06/22/2012 0525   HgbA1C  Lab Results  Component Value Date    HGBA1C 6.2* 06/24/2012   Urine Drug Screen:   No results found for this basename: labopia,  cocainscrnur,  labbenz,  amphetmu,  thcu,  labbarb    Alcohol Level: No results found for this basename: ETH,  in the last 168 hours  Ct Head Wo Contrast 07/03/2012 No significant change. Blood in the occipital horn of the right lateral ventricle is more apparent. Midline shift is essentially unchanged. No new hemorrhage on the left.  06/26/2012    Overall slight improvement in the degree of midline shift compared with priors.  No new areas of infarction.  No convincing signs for trapped right lateral ventricle on today's exam.  Stable multifocal predominantly left MCA territory infarcts with hemorrhagic transformation; no new infarcts or new lobar hemorrhage from priors.     06/24/2012    Large left parietal - frontal - posterior left temporal lobe hemorrhagic infarct and mass effect upon the left lateral ventricle relatively similar to the recent exam.  Interval development of interventricular blood within the tract right lateral ventricle.  Subarachnoid blood most notable right convexity and right sylvian fissure more apparent than on the prior exam.  Bilateral anterior frontal lobe infarcts greater on the left with minimal associated petechial hemorrhage.    06/23/2012   Minimal change in the large left hemorrhagic infarct.  Minimal change in the midline shift.   06/22/2012    Image quality degraded by significant motion.  There is a large area of hemorrhage in the left parietal lobe with mass effect and 10 mm midline shift.  This is most consistent with hemorrhagic infarction following TPA.     06/22/2012   Ct Portable Head W/o Cm Large area of hemorrhagic infarction left parietal lobe is similar. There is improvement in blood in the corpus collosum/ caudate on the left.  8 mm midline shift to the right without hydrocephalus.    06/21/2012    Chronic ischemic changes, right greater than left.  No acute  abnormality.    MRI of the brain    MRA of the brain    2D Echocardiogram  ejection fraction was in the range of 60% to 65%. Possible mild hypokinesis of the apical myocardium. - Aortic valve: Trivial regurgitation. - Mitral valve: Moderate regurgitation. - Left atrium: The atrium was mildly dilated. - Right ventricle: There was moderate hypertrophy. - Right atrium: The atrium was mildly dilated. - Pulmonary arteries: Systolic pressure was mildly  Carotid Doppler  Technically limited due to right IJ line and patient immobility. No evidence of significant  ICA stenosis noted. Antegrade vertebral flow.  CXR   07/11/2012 No acute findings 07/08/2012 Interval improvement in the atelectasis at the bases. Status post  removal of support apparatus. No additional interval changes. 07/03/2012 Bibasilar atelectasis. 07/01/2012   Improved aeration bilaterally.    06/27/2012    Improved bibasilar aeration.  06/26/2012   1.  Low inspiratory volumes with new patchy right basilar opacity. Differential considerations include developing infiltrate, asymmetric pulmonary edema, and potentially atelectasis although this is less likely given the imaging appearance. 2.  Stable left retrocardiac opacity may reflect atelectasis or infiltrate. 3.  Pulmonary vascular congestion bordering on mild interstitial edema.    06/24/2012   Stable cardiomegaly.  No acute findings.    06/23/2012  1.  Nasogastric tube placement least as far as the stomach. 2.  Interval increase in bilateral edema or infiltrates.   06/22/2012  Tip of right jugular line projects over SVC without pneumothorax. Bibasilar atelectasis.   Original Report Authenticated By: Ulyses Southward, M.D.  06/21/2012   Moderate cardiomegaly.  No active disease.   EKG  normal sinus rhythm.   EEG no epileptiform activity  Therapy Recommendations unable to participate  Physical Exam  General - the patient is resting comfortably. He coughs occasionally. Heart - Regular  rate and rhythm - no murmer Lungs - diffuse rhonchi Abdomen - Soft - non tender Extremities - Distal pulses intact - no edema Skin - Warm and dry   Neurologic Exam  Mental Status: Sleeping - difficult to arouse. Opens eyes spontaneously. Does not track.  Not following commands.  Cranial nerves: pupils are symmetric.   eyes  are open bilaterally, no blink to threat from right side. The patient is non-verbal. He will not follow verbal commands. Left gaze deviation.  Motor:  Mild withdrawal to pain in  left upper and lower extremities but no spontaneous movements. Dense right hemiplegia with hypotonia  Reflexes: SLIGHTLY BRISK IN LUE AND LLE and depressed on the right.   Coordination and gait: unable to test.  ASSESSMENT [name] is a 57 y.o. male presenting with expressive aphasia with unintelligible speech output and right hemiparesis. He is status post IV t-PA 06/21/2012 at 20:17. Symptoms were consistent with left middle cerebral artery acute stroke.  Imaging confirmed a large left parietal - frontal - posterior left temporal lobe ischemic infarct with old right frontal infarct.  Followup CT repeated due to acute delirium, tonic-clonic seizure activity in left arm, decreasing consciousness. That scan 06/22/2012 shows that there is a large 6 cm  area of hemorrhagic transformation in the left parietal lobe with cerebral edema - mass effect upon the left lateral ventricle and 10 mm midline shift.  Patient was subsequently intubated for airway protection. 3% saline started to decrease cytotoxic edema. F/u CT shows interval development of interventricular blood within the tract right lateral ventricle and subarachnoid blood most notable right convexity and right sylvian fissure  Initial infarcts felt to be embolic, source most likely cardioembolic from acute MI. On no antithrombotics prior to admission. Now on no antithrombitics due to hemorrhage.  Patient with resultant VDRF - now  extubated, stuporous, global aphasia, right hemiparesis, dysphagia, left arm decorticate.   Hypernatremia, induced with 3% Saline to decrease cerebral edema -  sodium dropped, but then increased in the setting of elevated bun and creatinine and saline which was switched to D5W. Na 171 --> 170  Dehydration.   Hyperlipidemia, LDL 109, not on statin prior to admission, on atorvastatin now  Chronic  kidney disease, stage 3 worsening renal failure.  Malignant Hypertension, on admission, changing to medications and weaning off cardene with SBP goal  < 180,  norvasc increased and added to beta-blocker regimen. Clonidine added.  Possible NSTEMI with Troponin I normal on admission, elevation to 11.15 [facility] D[facility], Dr Algie Coffer consulted, not a candidate for cardiac intervention until stabilizes.   Tachycardia, resolved. Placed On lopressor 100mg  total daily.   Seizure-like activity with hemorrhage, EEG without epileptiform activity. On dilantin.  Right basilar pneumonia, MSSA, treated with ancef   Leukocutosis and fever,  currently on Levaquin  Hospital day # 21  TREATMENT/PLAN  Continues to be hypernatremic (170). Now on D5W.  Discontinue saline.  Internal medicine consulted to address medical issues and is agreeable to transition care.transfer patient to Dr Chancy Milroy service after discussion  Patient is DNR.   Family awaiting PEG placement, per radiology- it is on hold until medical issues are stabilized. They are following  Child psychotherapist for placement    [employer]. Manson Passey, [facility], MBA, MHA [facility] Pager: [phone] 07/12/2012 1:26 PM   I have personally obtained a history, examined the patient, evaluated imaging results, and formulated the assessment and plan of care. I agree with the above. Delia Heady, MD

## 2012-07-12 NOTE — Progress Notes (Signed)
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 Hospice not felt to be an option, good LTAC candidate.   Acute respiratory failure - intubated for AMS   Cerebral edema with midline shift   Acute renal insufficiency: Worsening today. We'll try to increase IV fluids.   Hypertensive crisis with hemorrhagic CVA: Pressure still ranging from the 150s to 170s.   Seizure, possible: On IV Dilantin, as per pharmacy recommendations-increasing dose.   Hypokalemia   AMI (acute myocardial infarction): See my cardiology, not candidate for intervention given massive CVA. On IV Lopressor.  Hyperglycemia: Continue Lantus and sliding scale. Sugars persistently elevated so increase Lantus. Suspect cause of this is additional issues such as infection and sodium levels., This issues currently stable.   MSSA (methicillin susceptible Staphylococcus aureus) pneumonia: See below.    Fever: Felt to be secondary to recurrent aspiration    Aspiration pneumonia: Completed course of antibiotics for one pneumonia. However with persistent elevated white count and recurrent pneumonia. We started antibiotics. Suspected he also may be aspirated secretions, because at this point he has been n.p.o. since intubation. He is receiving feeding through PEG tube. If he is indeed aspirating secretions, there is little we can do. Have asked palliative care for goals of care.    Hypernatremia: Has now been on D5W for several days. Minimal response to sodium levels. Suspect that this is an indication that we are reaching end-of-life.   Code Status: DO NOT RESUSCITATE  Family Communication: Spoke with son at the bedside today. Spoke with wife briefly by phone.  Disposition Plan:  Unclear. Have consulted palliative care for goals of care meeting.   Consultants:  Stroke service-primary attending. Hospitalists assumed care on 7/3  Critical care-ICU medical management. Signed off  [place]-cardiology  Palliative care  Procedures: Echocardiogram done 6/14: Left ventricle: The cavity size was normal. There was moderate concentric hypertrophy. Systolic function was normal. The estimated ejection fraction was in the range of 60% to 65%. Possible mild hypokinesis of the apical myocardium. - Aortic valve: Trivial regurgitation. - Mitral valve: Moderate regurgitation. - Left atrium: The atrium was mildly dilated. - Right ventricle: There was moderate hypertrophy. - Right atrium: The atrium was mildly dilated.   Carotid Dopplers on 6/14:0-39% stenosis bilaterally  Antibiotics:  Previously completed seven-day course of IV antibiotics.  IV vancomycin started 7/2  IV Zosyn started 7/2  HPI/Subjective: NG tube currently in place. Patient resting. Does not open eyes for me.  Objective: Filed Vitals:   07/12/12 0500 07/12/12 0735 07/12/12 1001 07/12/12 1452  BP: 159/82 155/86 150/72 153/79  Pulse: 97 86 82 84  Temp: 98.3 F (36.8 C) 98.5 F (36.9 C)  98.1 F (36.7 C)  TempSrc: Oral Oral  Oral  Resp: 22 20 20 18   Height:      Weight: 69.9 kg (154 lb 1.6 oz) 63.9 kg (140 lb 14 oz)    SpO2: 97% 100% 96% 96%    Intake/Output Summary (Last 24 hours) at 07/12/12 1511 Last data filed at 07/12/12 0700  Gross per 24 hour  Intake      0 ml  Output   2170 ml  Net  -2170 ml   Filed Weights   07/11/12 0600 07/12/12 0500 07/12/12 0735  Weight: 69.6 kg (153 lb 7 oz) 69.9 kg (154  lb 1.6 oz) 63.9 kg (140 lb 14 oz)    Exam:   General:  Minimally responsive, sleeping  Cardiovascular: Regular rate and rhythm, S1-S2  Respiratory: Few rales  Abdomen: Soft, nondistended, hypoactive bowel sounds  Musculoskeletal: No clubbing or cyanosis, trace edema    Data Reviewed: Basic Metabolic Panel:  Recent Labs Lab 07/06/12 0545  07/09/12 1015 07/10/12 1454 07/11/12 1622 07/12/12 0008 07/12/12 1215  NA 152*  < > 168* 170* 171* 170* 169*  K 4.4  < > 4.7 4.3 4.3 4.9 4.2  CL 114*  < > 129* >130* >130* >130* >130*  CO2 27  < > 21 21 23 19 21   GLUCOSE 287*  < > 299* 459* 410* 189* 353*  BUN 61*  < > 92* 96* 89* 83* 76*  CREATININE 2.01*  < > 2.83* 2.50* 2.48* 2.56* 2.75*  CALCIUM 8.7  < > 9.2 9.2 8.4 8.6 8.6  MG 2.6*  --   --   --  3.0*  --   --   PHOS 4.6  --   --   --   --   --   --   < > = values in this interval not displayed. Liver Function Tests:  Recent Labs Lab 07/08/12 1412  AST 338*  ALT 138*  ALKPHOS 135*  BILITOT 0.4  PROT 8.5*  ALBUMIN 2.3*   CBC:  Recent Labs Lab 07/06/12 0545 07/08/12 1412 07/10/12 1454 07/12/12 0910 07/12/12 1215  WBC 13.7* 20.8* 28.4* 19.3* 18.6*  NEUTROABS  --  18.3*  --   --   --   HGB 11.5* 13.4 13.6 12.8* 12.5*  HCT 39.4 45.2 47.1 44.1 43.0  MCV 67.0* 68.5* 70.7* 69.3* 70.0*  PLT 296 323 270 186 169   CBG:  Recent Labs Lab 07/11/12 2018 07/11/12 2330 07/12/12 0426 07/12/12 0829 07/12/12 1219  GLUCAP 273* 173* 232* 236* 309*    Recent Results (from the past 240 hour(s))  URINE CULTURE     Status: None   Collection Time    07/08/12  3:00 PM      Result Value Range Status   Specimen Description URINE, RANDOM   Final   Special Requests NONE   Final   Culture  Setup Time 07/08/2012 21:02   Final   Colony Count >=100,000 COLONIES/ML   Final   Culture PSEUDOMONAS AERUGINOSA   Final   Report Status PENDING   Incomplete   Organism ID, Bacteria PSEUDOMONAS AERUGINOSA   Final  CULTURE, BLOOD (ROUTINE X 2)     Status: None   Collection Time    07/10/12  2:45 PM      Result Value Range Status   Specimen Description BLOOD RIGHT HAND   Final   Special Requests BOTTLES DRAWN AEROBIC ONLY 4CC   Final   Culture  Setup Time 07/10/2012 20:49   Final   Culture     Final    Value:        BLOOD CULTURE RECEIVED NO GROWTH TO DATE CULTURE WILL BE HELD FOR 5 DAYS BEFORE ISSUING A FINAL NEGATIVE REPORT   Report Status PENDING   Incomplete  CULTURE, BLOOD (ROUTINE X 2)     Status: None   Collection Time    07/10/12  2:50 PM      Result Value Range Status   Specimen Description BLOOD LEFT HAND   Final   Special Requests BOTTLES DRAWN AEROBIC ONLY 4CC   Final   Culture  Setup  Time 07/10/2012 20:49   Final   Culture     Final   Value:        BLOOD CULTURE RECEIVED NO GROWTH TO DATE CULTURE WILL BE HELD FOR 5 DAYS BEFORE ISSUING A FINAL NEGATIVE REPORT   Report Status PENDING   Incomplete     Studies: Dg Chest Port 1 View  07/11/2012    IMPRESSION: No acute findings.   Original Report Authenticated By: Leanna Battles, M.D.    Scheduled Meds: . albuterol  2.5 mg Nebulization BID  . antiseptic oral rinse  15 mL Mouth Rinse QID  . chlorhexidine  15 mL Mouth Rinse BID  . cloNIDine  0.2 mg Transdermal Weekly  . eye wash  1 drop Left Eye BID  . feeding supplement  30 mL Per Tube BID  . free water  400 mL Per Tube Q6H  . insulin aspart  0-9 Units Subcutaneous Q4H  . insulin glargine  20 Units Subcutaneous Daily  . metoprolol  5 mg Intravenous Q6H  . phenytoin (DILANTIN) IV  100 mg Intravenous Q8H  . piperacillin-tazobactam (ZOSYN)  IV  3.375 g Intravenous Q8H  . sodium chloride  10-40 mL Intracatheter Q12H  . [START ON 07/13/2012] vancomycin  750 mg Intravenous Q24H   Continuous Infusions: . dextrose 100 mL/hr at 07/12/12 1053  . 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

## 2012-07-12 NOTE — Progress Notes (Signed)
NTS performed.  Small amount of thick white secretions returned.  Pt tolerated procedure well.  No complications noted.

## 2012-07-12 NOTE — Progress Notes (Signed)
criticalCRITICAL VALUE ALERT  Critical value received:  Sodium 169, chloride greater than 130  Date of notification: 07/12/12   Time of notification:  1327  Critical value read back:yes  Nurse who received alert:  Lucienne Capers,   MD notified (1st page): dr. Rito Ehrlich  Time of first page:  1327  MD notified (2nd page):  Time of second page:  Responding MD:  Non new orders given at this time by Dr. Rito Ehrlich  Time MD responded:  1329

## 2012-07-12 NOTE — Progress Notes (Signed)
7/3  CBGs on 7/2  [phone] mg/dl   7/3  [phone] mg/dl Recommend increasing Novolog correction scale to MODERATE every 4 hours. Consider increasing Lantus to 25 units daily if CBGs continue greater than 180 mg/dl.     Smith Mince RN BSN CDE

## 2012-07-12 NOTE — Clinical Social Work Note (Addendum)
3:25pm- CSW attempted for a second time to reach wife.  CSW left message.  Awaiting a return call.  1:26pmCSW received a call from wife.  CSW returned call and left message for wife to call back.  [phone] (cell)  Per [facility] treatment team meeting this morning, pt will be d/c to SNF no later than Saturday if refused LTACH.    Vickii Penna, LCSWA [phone]  Clinical Social Work

## 2012-07-12 NOTE — Progress Notes (Signed)
Lab notified this nurse of critical lab : NA 170, Cl >130. On call doctor paged. No new orders at this time. Will continue to monitor.

## 2012-07-13 DIAGNOSIS — Z515 Encounter for palliative care: Secondary | ICD-10-CM

## 2012-07-13 DIAGNOSIS — I6350 Cerebral infarction due to unspecified occlusion or stenosis of unspecified cerebral artery: Secondary | ICD-10-CM

## 2012-07-13 DIAGNOSIS — I10 Essential (primary) hypertension: Secondary | ICD-10-CM

## 2012-07-13 DIAGNOSIS — D619 Aplastic anemia, unspecified: Secondary | ICD-10-CM

## 2012-07-13 DIAGNOSIS — R4702 Dysphasia: Secondary | ICD-10-CM | POA: Diagnosis present

## 2012-07-13 LAB — GLUCOSE, CAPILLARY
Glucose-Capillary: 236 mg/dL — ABNORMAL HIGH (ref 70–99)
Glucose-Capillary: 231 mg/dL — ABNORMAL HIGH (ref 70–99)
Glucose-Capillary: 166 mg/dL — ABNORMAL HIGH (ref 70–99)

## 2012-07-13 LAB — BASIC METABOLIC PANEL
BUN: 62 mg/dL — ABNORMAL HIGH (ref 6–23)
CO2: 22 mEq/L (ref 19–32)
Chloride: >130 mEq/L (ref 96–112)
Creatinine, Ser: 2.43 mg/dL — ABNORMAL HIGH (ref 0.50–1.35)
GFR calc Af Amer: 32 mL/min — ABNORMAL LOW (ref >90)
Glucose, Bld: 299 mg/dL — ABNORMAL HIGH (ref 70–99)
Potassium: 4 mEq/L (ref 3.5–5.1)
Sodium: 166 mEq/L (ref 135–145)
Sodium: 164 mEq/L (ref 135–145)

## 2012-07-13 LAB — CBC
MCH: 20.5 pg — ABNORMAL LOW (ref 26.0–34.0)
MCHC: 29.1 g/dL — ABNORMAL LOW (ref 30.0–36.0)
Platelets: 153 10*3/uL (ref 150–400)
RBC: 5.91 MIL/uL — ABNORMAL HIGH (ref 4.22–5.81)

## 2012-07-13 MED ORDER — POLYVINYL ALCOHOL 1.4 % OP SOLN
1.0000 [drp] | Freq: Two times a day (BID) | OPHTHALMIC | Status: DC
  Administered 2012-07-13 – 2012-07-17 (×9): 1 [drp] via OPHTHALMIC
  Filled 2012-07-13: qty 15

## 2012-07-13 NOTE — Progress Notes (Signed)
Stroke Team Progress Note  HISTORY [name] is a 57 y.o. 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 No focal weakness was noted. The  patient had been taking aspirin 81 mg per day. CT scan showed no acute intracranial abnormality. NIH stroke score was 8. Patient had marked expressive aphasia as well as gaze preference to the left side and right visual field defect to visual confrontation. He was deemed a candidate for intravenous thrombolytic therapy with TPA.   LSN: [id number] on 06/21/2012  tPA Given: Yes  MRankin: 2  SUBJECTIVE. Son at bedside. Patient opens eyes to deep stimulation  OBJECTIVE Most recent Vital Signs: Filed Vitals:   07/13/12 0148 07/13/12 0525 07/13/12 0900 07/13/12 1000  BP: 150/105 162/98  158/100  Pulse: 95 98  87  Temp: 99.2 F (37.3 C) 98.2 F (36.8 C)  98 F (36.7 C)  TempSrc: Axillary Axillary  Axillary  Resp: 20 24  20   Height:      Weight:      SpO2: 100% 98% 98% 100%   CBG (last 3)   Recent Labs  07/13/12 0112 07/13/12 0411 07/13/12 0820  GLUCAP 228* 236* 231*    IV Fluid Intake:   . dextrose 100 mL/hr at 07/12/12 2114  . feeding supplement (GLUCERNA 1.2 CAL) 1,000 mL (07/11/12 1645)    MEDICATIONS  . albuterol  2.5 mg Nebulization BID  . antiseptic oral rinse  15 mL Mouth Rinse QID  . chlorhexidine  15 mL Mouth Rinse BID  . cloNIDine  0.2 mg Transdermal Weekly  . feeding supplement  30 mL Per Tube BID  . free water  400 mL Per Tube Q4H  . insulin aspart  0-9 Units Subcutaneous Q4H  . insulin glargine  25 Units Subcutaneous Daily  . metoprolol  5 mg Intravenous Q6H  . phenytoin (DILANTIN) IV  175 mg Intravenous Q12H  .  piperacillin-tazobactam (ZOSYN)  IV  3.375 g Intravenous Q8H  . polyvinyl alcohol  1 drop Left Eye BID  . sodium chloride  10-40 mL Intracatheter Q12H  . vancomycin  750 mg Intravenous Q24H   PRN:  acetaminophen, acetaminophen, artificial tears, fentaNYL, labetalol, LORazepam, metoprolol  Diet:  NPO tube feeds  Activity:  Bedrest DVT Prophylaxis:  SCD  CLINICALLY SIGNIFICANT STUDIES Basic Metabolic Panel:   Recent Labs Lab 07/11/12 1622  07/12/12 1215 07/13/12 0140  NA 171*  < > 169* 166*  K 4.3  < > 4.2 4.5  CL >130*  < > >130* >130*  CO2 23  < > 21 22  GLUCOSE 410*  < > 353* 266*  BUN 89*  < > 76* 67*  CREATININE 2.48*  < > 2.75* 2.43*  CALCIUM 8.4  < > 8.6 8.3*  MG 3.0*  --   --   --   < > = values in this interval not displayed. Liver Function Tests:   Recent Labs Lab 07/08/12 1412  AST 338*  ALT 138*  ALKPHOS 135*  BILITOT 0.4  PROT 8.5*  ALBUMIN 2.3*   CBC:   Recent Labs Lab 07/08/12 1412  07/12/12 0910 07/12/12 1215  WBC 20.8*  < > 19.3* 18.6*  NEUTROABS 18.3*  --   --   --  HGB 13.4  < > 12.8* 12.5*  HCT 45.2  < > 44.1 43.0  MCV 68.5*  < > 69.3* 70.0*  PLT 323  < > 186 169  < > = values in this interval not displayed. Coagulation:  No results found for this basename: LABPROT, INR,  in the last 168 hours Cardiac Enzymes:  No results found for this basename: CKTOTAL, CKMB, CKMBINDEX, TROPONINI,  in the last 168 hours Urinalysis:   Recent Labs Lab 07/08/12 1500 07/10/12 1742  COLORURINE YELLOW AMBER*  LABSPEC 1.025 1.025  PHURINE 5.0 5.0  GLUCOSEU NEGATIVE >1000*  HGBUR LARGE* LARGE*  BILIRUBINUR NEGATIVE NEGATIVE  KETONESUR NEGATIVE NEGATIVE  PROTEINUR >300* 100*  UROBILINOGEN 0.2 0.2  NITRITE NEGATIVE NEGATIVE  LEUKOCYTESUR SMALL* NEGATIVE   Lipid Panel    Component Value Date/Time   CHOL 185 06/22/2012 0525   TRIG 294* 06/28/2012 0600   HDL 58 06/22/2012 0525   CHOLHDL 3.2 06/22/2012 0525   VLDL 18 06/22/2012 0525   LDLCALC 109*  06/22/2012 0525   HgbA1C  Lab Results  Component Value Date   HGBA1C 6.2* 06/24/2012   Urine Drug Screen:   No results found for this basename: labopia,  cocainscrnur,  labbenz,  amphetmu,  thcu,  labbarb    Alcohol Level: No results found for this basename: ETH,  in the last 168 hours  Ct Head Wo Contrast 07/03/2012 No significant change. Blood in the occipital horn of the right lateral ventricle is more apparent. Midline shift is essentially unchanged. No new hemorrhage on the left.  06/26/2012    Overall slight improvement in the degree of midline shift compared with priors.  No new areas of infarction.  No convincing signs for trapped right lateral ventricle on today's exam.  Stable multifocal predominantly left MCA territory infarcts with hemorrhagic transformation; no new infarcts or new lobar hemorrhage from priors.     06/24/2012    Large left parietal - frontal - posterior left temporal lobe hemorrhagic infarct and mass effect upon the left lateral ventricle relatively similar to the recent exam.  Interval development of interventricular blood within the tract right lateral ventricle.  Subarachnoid blood most notable right convexity and right sylvian fissure more apparent than on the prior exam.  Bilateral anterior frontal lobe infarcts greater on the left with minimal associated petechial hemorrhage.    06/23/2012   Minimal change in the large left hemorrhagic infarct.  Minimal change in the midline shift.   06/22/2012    Image quality degraded by significant motion.  There is a large area of hemorrhage in the left parietal lobe with mass effect and 10 mm midline shift.  This is most consistent with hemorrhagic infarction following TPA.     06/22/2012   Ct Portable Head W/o Cm Large area of hemorrhagic infarction left parietal lobe is similar. There is improvement in blood in the corpus collosum/ caudate on the left.  8 mm midline shift to the right without hydrocephalus.    06/21/2012     Chronic ischemic changes, right greater than left.  No acute abnormality.    MRI of the brain    MRA of the brain    2D Echocardiogram  ejection fraction was in the range of 60% to 65%. Possible mild hypokinesis of the apical myocardium. - Aortic valve: Trivial regurgitation. - Mitral valve: Moderate regurgitation. - Left atrium: The atrium was mildly dilated. - Right ventricle: There was moderate hypertrophy. - Right atrium: The atrium was mildly dilated. -  Pulmonary arteries: Systolic pressure was mildly  Carotid Doppler  Technically limited due to right IJ line and patient immobility. No evidence of significant ICA stenosis noted. Antegrade vertebral flow.  CXR   07/11/2012 No acute findings 07/08/2012 Interval improvement in the atelectasis at the bases. Status post  removal of support apparatus. No additional interval changes. 07/03/2012 Bibasilar atelectasis. 07/01/2012   Improved aeration bilaterally.    06/27/2012    Improved bibasilar aeration.  06/26/2012   1.  Low inspiratory volumes with new patchy right basilar opacity. Differential considerations include developing infiltrate, asymmetric pulmonary edema, and potentially atelectasis although this is less likely given the imaging appearance. 2.  Stable left retrocardiac opacity may reflect atelectasis or infiltrate. 3.  Pulmonary vascular congestion bordering on mild interstitial edema.    06/24/2012   Stable cardiomegaly.  No acute findings.    06/23/2012  1.  Nasogastric tube placement least as far as the stomach. 2.  Interval increase in bilateral edema or infiltrates.   06/22/2012  Tip of right jugular line projects over SVC without pneumothorax. Bibasilar atelectasis.   Original Report Authenticated By: Ulyses Southward, M.D.  06/21/2012   Moderate cardiomegaly.  No active disease.   EKG  normal sinus rhythm.   EEG no epileptiform activity  Therapy Recommendations unable to participate  Physical Exam  General - the patient is  resting comfortably. He coughs occasionally. Heart - Regular rate and rhythm - no murmer Lungs - diffuse rhonchi Abdomen - Soft - non tender Extremities - Distal pulses intact - no edema Skin - Warm and dry   Neurologic Exam  Mental Status: eyes closed - difficult to arouse. Opens eyes spontaneously. Does not track.  Not following commands.  Cranial nerves: pupils are symmetric.   eyes  are open bilaterally, no blink to threat from right side. The patient is non-verbal. He will not follow verbal commands. Left gaze deviation.  Motor:  Mild withdrawal to pain in  left upper and lower extremities but no spontaneous movements. Dense right hemiplegia with hypotonia  Reflexes: SLIGHTLY BRISK IN LUE AND LLE and depressed on the right.   Coordination and gait: unable to test.  ASSESSMENT [name] is a 57 y.o. male presenting with expressive aphasia with unintelligible speech output and right hemiparesis. He is status post IV t-PA 06/21/2012 at 20:17. Symptoms were consistent with left middle cerebral artery acute stroke.  Imaging confirmed a large left parietal - frontal - posterior left temporal lobe ischemic infarct with old right frontal infarct.  Followup CT repeated due to acute delirium, tonic-clonic seizure activity in left arm, decreasing consciousness. That scan 06/22/2012 shows that there is a large 6 cm  area of hemorrhagic transformation in the left parietal lobe with cerebral edema - mass effect upon the left lateral ventricle and 10 mm midline shift.  Patient was subsequently intubated for airway protection. 3% saline started to decrease cytotoxic edema. F/u CT shows interval development of interventricular blood within the tract right lateral ventricle and subarachnoid blood most notable right convexity and right sylvian fissure  Initial infarcts felt to be embolic, source most likely cardioembolic from acute MI. On no antithrombotics prior to admission. Now on no  antithrombitics due to hemorrhage.  Patient with resultant VDRF - now extubated, stuporous, global aphasia, right hemiparesis, dysphagia, left arm decorticate.   Hypernatremia, induced with 3% Saline to decrease cerebral edema -  sodium dropped, but then increased in the setting of elevated bun and creatinine and saline which was  switched to D5W. Na 171 --> 170  Dehydration.   Hyperlipidemia, LDL 109, not on statin prior to admission, on atorvastatin now  Chronic kidney disease, stage 3 worsening renal failure.  Malignant Hypertension, on admission, changing to medications and weaning off cardene with SBP goal  < 180,  norvasc increased and added to beta-blocker regimen. Clonidine added.  Possible NSTEMI with Troponin I normal on admission, elevation to 11.15 [facility], Dr Algie Coffer consulted, not a candidate for cardiac intervention until stabilizes.   Tachycardia, resolved. Placed On lopressor 100mg  total daily.   Seizure-like activity with hemorrhage, EEG without epileptiform activity. On dilantin.  Right basilar pneumonia, MSSA, treated with ancef   Leukocutosis and fever,  currently on Levaquin  Hospital day # 22  TREATMENT/PLAN  Continues to be hypernatremic (170-->166). Free water.  Internal medicine has assumed primary care due to multiple medical issues  Palliative care has been consulted  Dr. Pearlean Brownie had long discussion with the son who wishes that patient have aggressive measures including trach and PEG. Primary team notified.  Stroke service will sign off.  Gwendolyn Lima. Manson Passey, [facility], MBA, MHA [facility] Pager: [phone] 07/13/2012 12:59 PM  I have personally obtained a history, examined the patient, evaluated imaging results, and formulated the assessment and plan of care. I agree with the above. Delia Heady, MD

## 2012-07-13 NOTE — Progress Notes (Signed)
[facility] PROGRESS NOTE  Gregorio Worley UJW:[id number] DOB: [birth year] DOA: 06/21/2012 PCP: Erlinda Hong, MD  Assessment/Plan: Principal Problem:   CVA (cerebral infarction): Stroke service following. See below for disposition. No antithrombotic to hemorrhage. 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 Hospice not felt to be an option, good LTAC candidate.   Acute respiratory failure - intubated for AMS   Cerebral edema with midline shift   Acute renal insufficiency: Improved by increasing free water. Continue to follow.   Seizure, possible: On IV Dilantin, as per pharmacy recommendations-increasing dose.   Hypokalemia   AMI (acute myocardial infarction): See my cardiology, not candidate for intervention given massive CVA. On IV Lopressor.  Hyperglycemia: Continue Lantus and sliding scale. Sugars persistently elevated so increase Lantus. Suspect cause of this is additional issues such as infection and sodium levels.,     MSSA (methicillin susceptible Staphylococcus aureus) pneumonia: See below.    Fever: Felt to be secondary to recurrent aspiration    Aspiration pneumonia: Completed course of antibiotics for one pneumonia. However with persistent elevated white count and recurrent pneumonia. We started antibiotics. Suspected he also may be aspirated secretions, because at this point he has been n.p.o. since intubation. He is receiving feeding through PEG tube. If he is indeed aspirating secretions, there is little we can do. Hopefully his swallowing will improve with more alertness once to improve his sodium level    Hypernatremia: Has now been on D5W for several days. Sodium level started to come down. May be in part from dehydration and volume concentration and also in part from neurologic   Code Status: DO NOT RESUSCITATE  Family Communication: Met with entire family today.  Disposition Plan:  Palliative care had goals of care today. Family understands everything. For now they want everything done, however patient is DO NOT RESUSCITATE. We'll plan to continue antibiotics and aggressively treat with free water. Monitor status and fever. Likely transfer to LTAC next week.   Consultants:  Stroke service-primary attending. Hospitalists assumed care on 7/3  Critical care-ICU medical management. Signed off  [place]-cardiology  Palliative care  Procedures: Echocardiogram done 6/14: Left ventricle: The cavity size was normal. There was moderate concentric hypertrophy. Systolic function was normal. The estimated ejection fraction was in the range of 60% to 65%. Possible mild hypokinesis of the apical myocardium. - Aortic valve: Trivial regurgitation. - Mitral valve: Moderate regurgitation. - Left atrium: The atrium was mildly dilated. - Right ventricle: There was moderate hypertrophy. - Right atrium: The atrium was mildly dilated.   Carotid Dopplers on 6/14:0-39% stenosis bilaterally  Antibiotics:  Previously completed seven-day course of IV antibiotics.  IV vancomycin started 7/2  IV Zosyn started 7/2  HPI/Subjective: NG tube currently in place. Patient resting. Does not open eyes for me. Same since yesterday  Objective: Filed Vitals:   07/13/12 0148 07/13/12 0525 07/13/12 0900 07/13/12 1000  BP: 150/105 162/98  158/100  Pulse: 95 98  87  Temp: 99.2 F (37.3 C) 98.2 F (36.8 C)  98 F (36.7 C)  TempSrc: Axillary Axillary  Axillary  Resp: 20 24  20   Height:      Weight:      SpO2: 100% 98% 98% 100%    Intake/Output Summary (Last 24 hours) at 07/13/12 1337 Last data filed at 07/13/12 0800  Gross per 24 hour  Intake    800 ml  Output   3000 ml  Net  -2200 ml  Filed Weights   07/11/12 0600 07/12/12 0500 07/12/12 0735  Weight: 69.6 kg (153 lb 7 oz) 69.9 kg (154 lb 1.6 oz) 63.9 kg (140 lb 14 oz)    Exam:   General:  Minimally responsive,  sleeping  Cardiovascular: Regular rate and rhythm, S1-S2  Respiratory: Few rales  Abdomen: Soft, nondistended, hypoactive bowel sounds  Musculoskeletal: No clubbing or cyanosis, trace edema   Data Reviewed: Basic Metabolic Panel:  Recent Labs Lab 07/10/12 1454 07/11/12 1622 07/12/12 0008 07/12/12 1215 07/13/12 0140  NA 170* 171* 170* 169* 166*  K 4.3 4.3 4.9 4.2 4.5  CL >130* >130* >130* >130* >130*  CO2 21 23 19 21 22   GLUCOSE 459* 410* 189* 353* 266*  BUN 96* 89* 83* 76* 67*  CREATININE 2.50* 2.48* 2.56* 2.75* 2.43*  CALCIUM 9.2 8.4 8.6 8.6 8.3*  MG  --  3.0*  --   --   --    Liver Function Tests:  Recent Labs Lab 07/08/12 1412  AST 338*  ALT 138*  ALKPHOS 135*  BILITOT 0.4  PROT 8.5*  ALBUMIN 2.3*   CBC:  Recent Labs Lab 07/08/12 1412 07/10/12 1454 07/12/12 0910 07/12/12 1215  WBC 20.8* 28.4* 19.3* 18.6*  NEUTROABS 18.3*  --   --   --   HGB 13.4 13.6 12.8* 12.5*  HCT 45.2 47.1 44.1 43.0  MCV 68.5* 70.7* 69.3* 70.0*  PLT 323 270 186 169   CBG:  Recent Labs Lab 07/12/12 1618 07/12/12 2021 07/13/12 0112 07/13/12 0411 07/13/12 0820  GLUCAP 296* 292* 228* 236* 231*    Recent Results (from the past 240 hour(s))  URINE CULTURE     Status: None   Collection Time    07/08/12  3:00 PM      Result Value Range Status   Specimen Description URINE, RANDOM   Final   Special Requests NONE   Final   Culture  Setup Time 07/08/2012 21:02   Final   Colony Count >=100,000 COLONIES/ML   Final   Culture PSEUDOMONAS AERUGINOSA   Final   Report Status PENDING   Incomplete   Organism ID, Bacteria PSEUDOMONAS AERUGINOSA   Final  CULTURE, BLOOD (ROUTINE X 2)     Status: None   Collection Time    07/10/12  2:45 PM      Result Value Range Status   Specimen Description BLOOD RIGHT HAND   Final   Special Requests BOTTLES DRAWN AEROBIC ONLY 4CC   Final   Culture  Setup Time 07/10/2012 20:49   Final   Culture     Final   Value:        BLOOD CULTURE RECEIVED  NO GROWTH TO DATE CULTURE WILL BE HELD FOR 5 DAYS BEFORE ISSUING A FINAL NEGATIVE REPORT   Report Status PENDING   Incomplete  CULTURE, BLOOD (ROUTINE X 2)     Status: None   Collection Time    07/10/12  2:50 PM      Result Value Range Status   Specimen Description BLOOD LEFT HAND   Final   Special Requests BOTTLES DRAWN AEROBIC ONLY 4CC   Final   Culture  Setup Time 07/10/2012 20:49   Final   Culture     Final   Value:        BLOOD CULTURE RECEIVED NO GROWTH TO DATE CULTURE WILL BE HELD FOR 5 DAYS BEFORE ISSUING A FINAL NEGATIVE REPORT   Report Status PENDING   Incomplete     Studies:  Dg Chest Port 1 View  07/11/2012    IMPRESSION: No acute findings.   Original Report Authenticated By: Leanna Battles, M.D.    Scheduled Meds: . albuterol  2.5 mg Nebulization BID  . antiseptic oral rinse  15 mL Mouth Rinse QID  . chlorhexidine  15 mL Mouth Rinse BID  . cloNIDine  0.2 mg Transdermal Weekly  . feeding supplement  30 mL Per Tube BID  . free water  400 mL Per Tube Q4H  . insulin aspart  0-9 Units Subcutaneous Q4H  . insulin glargine  25 Units Subcutaneous Daily  . metoprolol  5 mg Intravenous Q6H  . phenytoin (DILANTIN) IV  175 mg Intravenous Q12H  . piperacillin-tazobactam (ZOSYN)  IV  3.375 g Intravenous Q8H  . polyvinyl alcohol  1 drop Left Eye BID  . sodium chloride  10-40 mL Intracatheter Q12H  . vancomycin  750 mg Intravenous Q24H   Continuous Infusions: . dextrose 100 mL/hr at 07/12/12 2114  . 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

## 2012-07-13 NOTE — Progress Notes (Signed)
[place] from lab called to report urine lab results. 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 Mid-Level NP M. Lynch made aware with no new orders. Patient is on Vancomycin and Zosyn IV

## 2012-07-13 NOTE — Clinical Social Work Note (Signed)
CSW talked with Mrs. Floma regarding discharge plans and facility responses. Her preferences are [facility] and [facility], and CSW talked with wife about patient being ready for discharge and discharging on Saturday, and wife indicated that she had just left the hospital after visiting with patient and talking with doctors and no mention was made to her of patient being ready for discharge. Mrs. Hartel added that she was headed home to sleep as she has to work this evening. Her plan is to visit the facilities Sunday or Monday. Wife advised that a SW will follow-up with her regarding her facility decision.    Genelle Bal, MSW, LCSW [phone]

## 2012-07-13 NOTE — Consult Note (Signed)
Palliative Care Team at [facility] Consult Note    Reason for Consult: Goals of Care, Symptom Management, Care Coordination Requesting Consultation: Dr. 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  Etiology of stroke felt to be cardio-embolic from large NSTEMI. He is originally from [place], in the [place] for 30+ years and per family recently graduated from medical school in [place], additionally, he holds a PhD in Holiday representative, is a professor at [employer] and was an Charity fundraiser at [employer]. Currently he has made little neurological improvement and his chances for making a meaningful recovery are very poor. He has had his second episode of aspiration PNA. If he regains his awareness he is likely to be severely disabled. PMT asked to consult for goals of care discussion.   Participants in Discussion: [name] (wife), son, two daughters, sister, and DIL  Summary of Goals:  1. DNR, confirmed with family. 2. Scope of Treatment:  Family are maintaining hope for a miracle. They acknowledge that he may not survive, that he may die from complications of his condition.  They do not want full comfort care-they perceive this route as giving up and do not feel that they can deal with the survivor guilt associated with such a decision at this point. They are still in shock. 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  They report that even though he has been in healthcare his whole life that he never talked about dying or advance directives, even though his wife says he would  not want to live this way. 3. Disposition:  Wife opposed to [facility], she draws blood for [facility] and is fearful of what this may look like for her husband.  She also knows that SNF would be difficult, and home is not an option.  This will be a difficult transition for this family.  OBJECTIVE: Vital Signs: BP 158/100  Pulse 87  Temp(Src) 98 F (36.7 C) (Axillary)  Resp 20  Ht 5\' 11"  (1.803 m)  Wt 63.9 kg (140 lb 14 oz)  BMI 19.66 kg/m2  SpO2 100%   Intake and Output: 07/03 0701 - 07/04 0700 In: 800 [NG/GT:800] Out: 2250 [Urine:2250]  Physical Exam: General: Critically ill appearing gentleman, does not follow commands but opens his eyes with moderate verbal and physical stimulation. Family report he moved his left hand.  Head: Normocephalic, atraumatic.  Lungs:  Diffuse rhonchi  Heart: RRR. S1 and S2 normal without gallop,  or rubs. (+) murmur  Abdomen:  BS normoactive. Soft, Nondistended, non-tender.  No masses or organomegaly.  Extremities: No pretibial edema. No spontaneous movement    No Known Allergies  Medications: Scheduled Meds:  . albuterol  2.5 mg Nebulization BID  . antiseptic oral rinse  15 mL Mouth Rinse QID  . chlorhexidine  15 mL Mouth Rinse BID  . cloNIDine  0.2 mg Transdermal Weekly  . feeding supplement  30 mL Per Tube BID  . free water  400 mL Per Tube Q4H  . insulin aspart  0-9 Units Subcutaneous Q4H  . insulin glargine  25 Units  Subcutaneous Daily  . metoprolol  5 mg Intravenous Q6H  . phenytoin (DILANTIN) IV  175 mg Intravenous Q12H  . piperacillin-tazobactam (ZOSYN)  IV  3.375 g Intravenous Q8H  . polyvinyl alcohol  1 drop Left Eye BID  . sodium chloride  10-40 mL Intracatheter Q12H  . vancomycin  750 mg Intravenous Q24H    Continuous Infusions: . dextrose 100 mL/hr at 07/12/12 2114  . feeding supplement (GLUCERNA 1.2 CAL) 1,000 mL (07/11/12 1645)    PRN Meds: acetaminophen, acetaminophen, artificial tears, fentaNYL, labetalol, LORazepam,  metoprolol  Palliative Performance Scale: 10 %   Labs: CBC    Component Value Date/Time   WBC 18.6* 07/12/2012 1215   RBC 6.14* 07/12/2012 1215   HGB 12.5* 07/12/2012 1215   HCT 43.0 07/12/2012 1215   PLT 169 07/12/2012 1215   MCV 70.0* 07/12/2012 1215   MCH 20.4* 07/12/2012 1215   MCHC 29.1* 07/12/2012 1215   RDW 24.4* 07/12/2012 1215   LYMPHSABS 1.7 07/08/2012 1412   MONOABS 0.8 07/08/2012 1412   EOSABS 0.0 07/08/2012 1412   BASOSABS 0.0 07/08/2012 1412    CMET     Component Value Date/Time   NA 166* 07/13/2012 0140   K 4.5 07/13/2012 0140   CL >130* 07/13/2012 0140   CO2 22 07/13/2012 0140   GLUCOSE 266* 07/13/2012 0140   BUN 67* 07/13/2012 0140   CREATININE 2.43* 07/13/2012 0140   CALCIUM 8.3* 07/13/2012 0140   PROT 8.5* 07/08/2012 1412   ALBUMIN 2.3* 07/08/2012 1412   AST 338* 07/08/2012 1412   ALT 138* 07/08/2012 1412   ALKPHOS 135* 07/08/2012 1412   BILITOT 0.4 07/08/2012 1412   GFRNONAA 28* 07/13/2012 0140   GFRAA 32* 07/13/2012 0140    70 minutes.Greater than 50%  of this time was spent counseling and coordinating care related to the above assessment and plan.   Edsel Petrin, DO  07/13/2012, 1:18 PM  Please contact Palliative Medicine Team phone at [phone] for questions and concerns.

## 2012-07-14 DIAGNOSIS — I10 Essential (primary) hypertension: Secondary | ICD-10-CM

## 2012-07-14 DIAGNOSIS — R4789 Other speech disturbances: Secondary | ICD-10-CM

## 2012-07-14 HISTORY — DX: Essential (primary) hypertension: I10

## 2012-07-14 LAB — BASIC METABOLIC PANEL
BUN: 56 mg/dL — ABNORMAL HIGH (ref 6–23)
Calcium: 8.2 mg/dL — ABNORMAL LOW (ref 8.4–10.5)
Calcium: 8.4 mg/dL (ref 8.4–10.5)
Creatinine, Ser: 2.07 mg/dL — ABNORMAL HIGH (ref 0.50–1.35)
GFR calc Af Amer: 37 mL/min — ABNORMAL LOW (ref >90)
GFR calc non Af Amer: 32 mL/min — ABNORMAL LOW (ref >90)
GFR calc non Af Amer: 34 mL/min — ABNORMAL LOW (ref >90)
Glucose, Bld: 177 mg/dL — ABNORMAL HIGH (ref 70–99)
Glucose, Bld: 187 mg/dL — ABNORMAL HIGH (ref 70–99)
Sodium: 166 mEq/L (ref 135–145)

## 2012-07-14 LAB — GLUCOSE, CAPILLARY
Glucose-Capillary: 160 mg/dL — ABNORMAL HIGH (ref 70–99)
Glucose-Capillary: 227 mg/dL — ABNORMAL HIGH (ref 70–99)

## 2012-07-14 LAB — CBC
Hemoglobin: 13.3 g/dL (ref 13.0–17.0)
MCH: 20.3 pg — ABNORMAL LOW (ref 26.0–34.0)
MCHC: 28.8 g/dL — ABNORMAL LOW (ref 30.0–36.0)
Platelets: 153 10*3/uL (ref 150–400)
RDW: 25 % — ABNORMAL HIGH (ref 11.5–15.5)

## 2012-07-14 MED ORDER — CLONIDINE HCL 0.3 MG/24HR TD PTWK
0.3000 mg | MEDICATED_PATCH | TRANSDERMAL | Status: DC
  Administered 2012-07-14: 0.3 mg via TRANSDERMAL
  Filled 2012-07-14 (×2): qty 1

## 2012-07-14 MED ORDER — CLONIDINE HCL 0.3 MG/24HR TD PTWK: 0.3000 mg | MEDICATED_PATCH | TRANSDERMAL | Status: DC

## 2012-07-14 MED ORDER — HYDRALAZINE HCL 20 MG/ML IJ SOLN: 10.0000 mg | Freq: Three times a day (TID) | INTRAMUSCULAR | Status: DC | PRN

## 2012-07-14 NOTE — Progress Notes (Signed)
[facility] PROGRESS NOTE  Shervin Cypert ZOX:[id number] DOB: [birth year] DOA: 06/21/2012 PCP: Erlinda Hong, MD  Assessment/Plan: Principal Problem:   CVA (cerebral infarction): Stroke service following. See below for disposition. No antithrombotic to hemorrhage.  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 At this time, the family would like aggressive measures. No hospice for now, good LTAC candidate.    Acute respiratory failure - status post extubation.    Cerebral edema with midline shift  Malignant hypertension: Pressures at times still elevated. When necessary hydralazine.    Acute renal insufficiency: Creatinine starting to rise today.   Seizure, possible: On IV Dilantin, as per pharmacy recommendations-increasing dose.    Hypokalemia   AMI (acute myocardial infarction): See my cardiology, not candidate for intervention given massive CVA. On IV Lopressor.  Hyperglycemia: Continue Lantus and sliding scale. Sugars persistently elevated so increase Lantus. Suspect cause of this is additional issues such as infection and sodium levels. Increase Lantus last night    MSSA (methicillin susceptible Staphylococcus aureus) pneumonia: See below.    Fever: Felt to be secondary to recurrent aspiration    Aspiration pneumonia: Completed course of antibiotics for one pneumonia. However with persistent elevated white count and recurrent pneumonia. We started antibiotics. Suspected he also may be aspirated secretions, because at this point he has been n.p.o. since intubation. He is receiving feeding through PEG tube. If he is indeed aspirating secretions, there is little we can do. Hopefully his swallowing will improve with more alertness once to improve his sodium level. For PEG tube next week    Hypernatremia: Has now been on D5W for several days. Sodium level started to trend back up. May be in part from  dehydration and volume concentration and also in part from neurologic   Code Status: DO NOT RESUSCITATE  Family Communication: Discussed with wife at the bedside today  Disposition Plan: Palliative care had goals of care today. Family understands everything. For now they want everything done, however patient is DO NOT RESUSCITATE. We'll plan to continue antibiotics and aggressively treat with free water. Monitor status and fever. Likely transfer to LTAC next week.   Consultants:  Stroke service-primary attending. Hospitalists assumed care on 7/3  Critical care-ICU medical management. Signed off  [place]-cardiology  Palliative care  Procedures: Echocardiogram done 6/14: Left ventricle: The cavity size was normal. There was moderate concentric hypertrophy. Systolic function was normal. The estimated ejection fraction was in the range of 60% to 65%. Possible mild hypokinesis of the apical myocardium. - Aortic valve: Trivial regurgitation. - Mitral valve: Moderate regurgitation. - Left atrium: The atrium was mildly dilated. - Right ventricle: There was moderate hypertrophy. - Right atrium: The atrium was mildly dilated.   Carotid Dopplers on 6/14:0-39% stenosis bilaterally  Antibiotics:  Previously completed seven-day course of IV antibiotics.  IV vancomycin started 7/2  IV Zosyn started 7/2  HPI/Subjective: NG tube currently in place. Patient awake today and tracks me with his eyes.  Objective: Filed Vitals:   07/13/12 2204 07/14/12 0120 07/14/12 0500 07/14/12 0700  BP: 171/84 154/88  173/96  Pulse: 101 88  86  Temp: 98.1 F (36.7 C) 98.3 F (36.8 C)  99.3 F (37.4 C)  TempSrc: Axillary Axillary  Axillary  Resp: 20 22  20   Height:      Weight:   69.911 kg (154 lb 2 oz)   SpO2: 100% 95%  100%    Intake/Output Summary (Last 24 hours)  at 07/14/12 1021 Last data filed at 07/14/12 0700  Gross per 24 hour  Intake      0 ml  Output   2005 ml  Net  -2005 ml    Filed Weights   07/12/12 0500 07/12/12 0735 07/14/12 0500  Weight: 69.9 kg (154 lb 1.6 oz) 63.9 kg (140 lb 14 oz) 69.911 kg (154 lb 2 oz)    Exam:   General:  Awake but not alert. Does not follow commands  Cardiovascular: Regular rate and rhythm, S1-S2  Respiratory: Few rales  Abdomen: Soft, nondistended, hypoactive bowel sounds  Musculoskeletal: No clubbing or cyanosis, trace edema   Data Reviewed: Basic Metabolic Panel:  Recent Labs Lab 07/11/12 1622  07/12/12 1215 07/13/12 0140 07/13/12 1508 07/14/12 0012 07/14/12 0540  NA 171*  < > 169* 166* 164* 165* 166*  K 4.3  < > 4.2 4.5 4.0 4.0 3.8  CL >130*  < > >130* >130* 128* 129* 129*  CO2 23  < > 21 22 22 26 23   GLUCOSE 410*  < > 353* 266* 299* 177* 187*  BUN 89*  < > 76* 67* 62* 56* 54*  CREATININE 2.48*  < > 2.75* 2.43* 2.28* 2.07* 2.16*  CALCIUM 8.4  < > 8.6 8.3* 8.2* 8.2* 8.4  MG 3.0*  --   --   --   --   --   --   < > = values in this interval not displayed. Liver Function Tests:  Recent Labs Lab 07/08/12 1412  AST 338*  ALT 138*  ALKPHOS 135*  BILITOT 0.4  PROT 8.5*  ALBUMIN 2.3*   CBC:  Recent Labs Lab 07/08/12 1412 07/10/12 1454 07/12/12 0910 07/12/12 1215 07/13/12 1508 07/14/12 0540  WBC 20.8* 28.4* 19.3* 18.6* 16.0* 14.9*  NEUTROABS 18.3*  --   --   --   --   --   HGB 13.4 13.6 12.8* 12.5* 12.1* 13.3  HCT 45.2 47.1 44.1 43.0 41.6 46.2  MCV 68.5* 70.7* 69.3* 70.0* 70.4* 70.4*  PLT 323 270 186 169 153 153   CBG:  Recent Labs Lab 07/13/12 1200 07/13/12 2006 07/14/12 0021 07/14/12 0410 07/14/12 0857  GLUCAP 290* 166* 160* 193* 205*    Recent Results (from the past 240 hour(s))  URINE CULTURE     Status: None   Collection Time    07/08/12  3:00 PM      Result Value Range Status   Specimen Description URINE, RANDOM   Final   Special Requests NONE   Final   Culture  Setup Time 07/08/2012 21:02   Final   Colony Count >=100,000 COLONIES/ML   Final   Culture     Final    Value: ENTEROBACTER CLOACAE     Note: THIS ISOLATE HAS BEEN CONFIRMED AS A KPC CARBAPENEMASE PRODUCER     PSEUDOMONAS AERUGINOSA   Report Status 07/13/2012 FINAL   Final   Organism ID, Bacteria ENTEROBACTER CLOACAE   Final   Organism ID, Bacteria PSEUDOMONAS AERUGINOSA   Final  CULTURE, BLOOD (ROUTINE X 2)     Status: None   Collection Time    07/10/12  2:45 PM      Result Value Range Status   Specimen Description BLOOD RIGHT HAND   Final   Special Requests BOTTLES DRAWN AEROBIC ONLY 4CC   Final   Culture  Setup Time 07/10/2012 20:49   Final   Culture     Final   Value:  BLOOD CULTURE RECEIVED NO GROWTH TO DATE CULTURE WILL BE HELD FOR 5 DAYS BEFORE ISSUING A FINAL NEGATIVE REPORT   Report Status PENDING   Incomplete  CULTURE, BLOOD (ROUTINE X 2)     Status: None   Collection Time    07/10/12  2:50 PM      Result Value Range Status   Specimen Description BLOOD LEFT HAND   Final   Special Requests BOTTLES DRAWN AEROBIC ONLY 4CC   Final   Culture  Setup Time 07/10/2012 20:49   Final   Culture     Final   Value:        BLOOD CULTURE RECEIVED NO GROWTH TO DATE CULTURE WILL BE HELD FOR 5 DAYS BEFORE ISSUING A FINAL NEGATIVE REPORT   Report Status PENDING   Incomplete     Studies: Dg Chest Port 1 View  07/11/2012    IMPRESSION: No acute findings.   Original Report Authenticated By: Leanna Battles, M.D.    Scheduled Meds: . albuterol  2.5 mg Nebulization BID  . antiseptic oral rinse  15 mL Mouth Rinse QID  . chlorhexidine  15 mL Mouth Rinse BID  . cloNIDine  0.2 mg Transdermal Weekly  . feeding supplement  30 mL Per Tube BID  . free water  400 mL Per Tube Q4H  . insulin aspart  0-9 Units Subcutaneous Q4H  . insulin glargine  25 Units Subcutaneous Daily  . metoprolol  5 mg Intravenous Q6H  . phenytoin (DILANTIN) IV  175 mg Intravenous Q12H  . piperacillin-tazobactam (ZOSYN)  IV  3.375 g Intravenous Q8H  . polyvinyl alcohol  1 drop Left Eye BID  . sodium chloride  10-40 mL  Intracatheter Q12H  . vancomycin  750 mg Intravenous Q24H   Continuous Infusions: . dextrose 100 mL/hr at 07/12/12 2114  . 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

## 2012-07-14 NOTE — Progress Notes (Signed)
Patient ID: [name], male   DOB: 1955-12-08, 57 y.o.   MRN: [id number]   Perc G tube request in IR  CVA; malnutrition Pt has existing NG  T: 99.3 today Wbc 14.9 U cx+ pseudomonas: on Vanco and Zosyn BC neg  Will recheck 7/7 pt status Plan probable G tube Tue or Wed next week

## 2012-07-14 NOTE — Progress Notes (Signed)
As per lab sodium level 165. Patient remains on every 4 hours free water flushes and D5W at 129ml/hr

## 2012-07-14 NOTE — Progress Notes (Addendum)
As of 11am, patient has been turned q2 hours at 8 and 10, mouth care provided at 8 and 10 (with betadine, chlorohexadine and suctioning with lip gel) and gastric residual was checked at 8 (was 10ml).  Minor, Yvette Rack

## 2012-07-14 NOTE — Progress Notes (Signed)
Text paged MD for lab, Na of 166, no new orders  Minor, Morrie Sheldon Sophonie Goforth

## 2012-07-14 NOTE — Clinical Social Work Note (Signed)
Clinicial Social Work  CSW was updated by MD today regarding discharge plan. The plan is to discharge pt to LTAC next week. Pallitative Care Team met with family for goals of care and pt is now a DNR. Per MD, pt will likely need a PEG, which can be done at [facility]. Per MD, pt has new acute diagnosis that are being treated, which can be found in his note from 07/14/2012. Please see CSW note from 07/13/2012 for details of possible SNF placement. CSW will continue to follow for discharge planning needs.   Dede Query, MSW, LCSW Clinical Social Work Weekend Coverage [phone]

## 2012-07-15 LAB — GLUCOSE, CAPILLARY
Glucose-Capillary: 176 mg/dL — ABNORMAL HIGH (ref 70–99)
Glucose-Capillary: 183 mg/dL — ABNORMAL HIGH (ref 70–99)
Glucose-Capillary: 249 mg/dL — ABNORMAL HIGH (ref 70–99)
Glucose-Capillary: 255 mg/dL — ABNORMAL HIGH (ref 70–99)

## 2012-07-15 LAB — CBC
HCT: 47.7 % (ref 39.0–52.0)
Hemoglobin: 13.6 g/dL (ref 13.0–17.0)
MCH: 20.3 pg — ABNORMAL LOW (ref 26.0–34.0)
MCV: 71.1 fL — ABNORMAL LOW (ref 78.0–100.0)
RBC: 6.71 MIL/uL — ABNORMAL HIGH (ref 4.22–5.81)

## 2012-07-15 LAB — BASIC METABOLIC PANEL
CO2: 27 mEq/L (ref 19–32)
Calcium: 8.4 mg/dL (ref 8.4–10.5)
Chloride: 123 mEq/L — ABNORMAL HIGH (ref 96–112)
Creatinine, Ser: 1.91 mg/dL — ABNORMAL HIGH (ref 0.50–1.35)
Glucose, Bld: 203 mg/dL — ABNORMAL HIGH (ref 70–99)

## 2012-07-15 MED ORDER — INSULIN GLARGINE 100 UNIT/ML ~~LOC~~ SOLN
28.0000 [IU] | Freq: Every day | SUBCUTANEOUS | Status: DC
  Administered 2012-07-15: 28 [IU] via SUBCUTANEOUS
  Filled 2012-07-15 (×3): qty 0.28

## 2012-07-15 MED ORDER — PIPERACILLIN-TAZOBACTAM 3.375 G IVPB
3.3750 g | Freq: Three times a day (TID) | INTRAVENOUS | Status: DC
  Administered 2012-07-15 – 2012-07-17 (×7): 3.375 g via INTRAVENOUS
  Filled 2012-07-15 (×9): qty 50

## 2012-07-15 MED ORDER — ALBUTEROL SULFATE (5 MG/ML) 0.5% IN NEBU: 2.5000 mg | INHALATION_SOLUTION | RESPIRATORY_TRACT | Status: DC | PRN

## 2012-07-15 MED ORDER — LEVOFLOXACIN IN D5W 750 MG/150ML IV SOLN
750.0000 mg | INTRAVENOUS | Status: DC
  Administered 2012-07-15 – 2012-07-17 (×2): 750 mg via INTRAVENOUS
  Filled 2012-07-15 (×3): qty 150

## 2012-07-15 NOTE — Progress Notes (Addendum)
[facility] PROGRESS NOTE  [name] ZOX:[id number] DOB: February 16, 1955 DOA: 06/21/2012 PCP: Erlinda Hong, MD  Assessment/Plan: Principal Problem:   CVA (cerebral infarction): Stroke service following. See below for disposition. No antithrombotic to hemorrhage.  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 At this time, the family would like aggressive measures. No hospice for now, good LTAC candidate.    Acute respiratory failure - status post extubation.    Cerebral edema with midline shift  Malignant hypertension: Pressures at times still elevated. When necessary hydralazine.    Acute renal insufficiency: Creatinine starting to rise today.   Seizure, possible: On IV Dilantin, as per pharmacy recommendations-increasing dose.    Hypokalemia   AMI (acute myocardial infarction): See my cardiology, not candidate for intervention given massive CVA. On IV Lopressor.  Hyperglycemia: Continue Lantus and sliding scale. Sugars persistently elevated so increase Lantus. Suspect cause of this is additional issues such as infection and sodium levels. Increase Lantus on 7/4, CBGs better today    MSSA (methicillin susceptible Staphylococcus aureus) pneumonia: See below.    Fever: Felt to be secondary to recurrent aspiration    Aspiration pneumonia: Completed course of antibiotics for one pneumonia. However with persistent elevated white count and recurrent pneumonia. We started antibiotics. Suspected he also may be aspirated secretions, because at this point he has been n.p.o. since intubation. He is receiving feeding through PEG tube. If he is indeed aspirating secretions, there is little we can do. Hopefully his swallowing will improve with more alertness once to improve his sodium level. For PEG tube next week. Noted white blood cell count increasing.    Hypernatremia: Has now been on D5W for several days.  Sodium level coming down today.. May be in part from dehydration and volume concentration and also in part from neurologic  UTI: Grip Pseudomonas and Enterobacter. Discussed with pharmacy today. Enterobacter actually showing some resistance so as per pharmacy recommendations will add Levaquin   Code Status: DO NOT RESUSCITATE  Family Communication: Discussed with wife at the bedside today  Disposition Plan: Palliative care had goals of care today. Family understands everything. For now they want everything done, however patient is DO NOT RESUSCITATE. We'll plan to continue antibiotics and aggressively treat with free water. Monitor status and fever. Likely transfer to LTAC next week.   Consultants:  Stroke service-primary attending. Hospitalists assumed care on 7/3  Critical care-ICU medical management. Signed off  [place]-cardiology  Palliative care  Procedures: Echocardiogram done 6/14: Left ventricle: The cavity size was normal. There was moderate concentric hypertrophy. Systolic function was normal. The estimated ejection fraction was in the range of 60% to 65%. Possible mild hypokinesis of the apical myocardium. - Aortic valve: Trivial regurgitation. - Mitral valve: Moderate regurgitation. - Left atrium: The atrium was mildly dilated. - Right ventricle: There was moderate hypertrophy. - Right atrium: The atrium was mildly dilated.   Carotid Dopplers on 6/14:0-39% stenosis bilaterally  Antibiotics:  Previously completed seven-day course of IV antibiotics.  IV vancomycin started 7/2  IV Zosyn started 7/2  IV Levaquin started 7/6  HPI/Subjective: NG tube currently in place. Patient occasionally opens eyes. Labored breathing.  Objective: Filed Vitals:   07/14/12 2200 07/15/12 0120 07/15/12 0500 07/15/12 0600  BP: 151/79 150/104  161/98  Pulse: 86 100  92  Temp: 98.4 F (36.9 C) 98.9 F (37.2 C)  97.9 F (36.6 C)  TempSrc: Axillary Axillary  Axillary  Resp:  24 22  20  Height:      Weight:   70.444 kg (155 lb 4.8 oz)   SpO2: 100% 100%  100%    Intake/Output Summary (Last 24 hours) at 07/15/12 0958 Last data filed at 07/15/12 0658  Gross per 24 hour  Intake      0 ml  Output   1801 ml  Net  -1801 ml   Filed Weights   07/12/12 0735 07/14/12 0500 07/15/12 0500  Weight: 63.9 kg (140 lb 14 oz) 69.911 kg (154 lb 2 oz) 70.444 kg (155 lb 4.8 oz)    Exam:   General:  Awake but not alert. Does not follow commands  Cardiovascular: Regular rate and rhythm, S1-S2  Respiratory: Few rales  Abdomen: Soft, nondistended, hypoactive bowel sounds  Musculoskeletal: No clubbing or cyanosis, trace edema   Data Reviewed: Basic Metabolic Panel:  Recent Labs Lab 07/11/12 1622  07/13/12 0140 07/13/12 1508 07/14/12 0012 07/14/12 0540 07/15/12 0513  NA 171*  < > 166* 164* 165* 166* 160*  K 4.3  < > 4.5 4.0 4.0 3.8 4.5  CL >130*  < > >130* 128* 129* 129* 123*  CO2 23  < > 22 22 26 23 27   GLUCOSE 410*  < > 266* 299* 177* 187* 203*  BUN 89*  < > 67* 62* 56* 54* 49*  CREATININE 2.48*  < > 2.43* 2.28* 2.07* 2.16* 1.91*  CALCIUM 8.4  < > 8.3* 8.2* 8.2* 8.4 8.4  MG 3.0*  --   --   --   --   --   --   < > = values in this interval not displayed. Liver Function Tests:  Recent Labs Lab 07/08/12 1412  AST 338*  ALT 138*  ALKPHOS 135*  BILITOT 0.4  PROT 8.5*  ALBUMIN 2.3*   CBC:  Recent Labs Lab 07/08/12 1412  07/12/12 0910 07/12/12 1215 07/13/12 1508 07/14/12 0540 07/15/12 0513  WBC 20.8*  < > 19.3* 18.6* 16.0* 14.9* 15.1*  NEUTROABS 18.3*  --   --   --   --   --   --   HGB 13.4  < > 12.8* 12.5* 12.1* 13.3 13.6  HCT 45.2  < > 44.1 43.0 41.6 46.2 47.7  MCV 68.5*  < > 69.3* 70.0* 70.4* 70.4* 71.1*  PLT 323  < > 186 169 153 153 111*  < > = values in this interval not displayed. CBG:  Recent Labs Lab 07/14/12 1608 07/14/12 2021 07/15/12 0105 07/15/12 0435 07/15/12 0832  GLUCAP 181* 249* 190* 183* 179*    Recent Results  (from the past 240 hour(s))  URINE CULTURE     Status: None   Collection Time    07/08/12  3:00 PM      Result Value Range Status   Specimen Description URINE, RANDOM   Final   Special Requests NONE   Final   Culture  Setup Time 07/08/2012 21:02   Final   Colony Count >=100,000 COLONIES/ML   Final   Culture     Final   Value: ENTEROBACTER CLOACAE     Note: THIS ISOLATE HAS BEEN CONFIRMED AS A KPC CARBAPENEMASE PRODUCER     PSEUDOMONAS AERUGINOSA   Report Status 07/13/2012 FINAL   Final   Organism ID, Bacteria ENTEROBACTER CLOACAE   Final   Organism ID, Bacteria PSEUDOMONAS AERUGINOSA   Final  CULTURE, BLOOD (ROUTINE X 2)     Status: None   Collection Time    07/10/12  2:45  PM      Result Value Range Status   Specimen Description BLOOD RIGHT HAND   Final   Special Requests BOTTLES DRAWN AEROBIC ONLY 4CC   Final   Culture  Setup Time 07/10/2012 20:49   Final   Culture     Final   Value:        BLOOD CULTURE RECEIVED NO GROWTH TO DATE CULTURE WILL BE HELD FOR 5 DAYS BEFORE ISSUING A FINAL NEGATIVE REPORT   Report Status PENDING   Incomplete  CULTURE, BLOOD (ROUTINE X 2)     Status: None   Collection Time    07/10/12  2:50 PM      Result Value Range Status   Specimen Description BLOOD LEFT HAND   Final   Special Requests BOTTLES DRAWN AEROBIC ONLY 4CC   Final   Culture  Setup Time 07/10/2012 20:49   Final   Culture     Final   Value:        BLOOD CULTURE RECEIVED NO GROWTH TO DATE CULTURE WILL BE HELD FOR 5 DAYS BEFORE ISSUING A FINAL NEGATIVE REPORT   Report Status PENDING   Incomplete     Studies: Dg Chest Port 1 View  07/11/2012    IMPRESSION: No acute findings.   Original Report Authenticated By: Leanna Battles, M.D.    Scheduled Meds: . antiseptic oral rinse  15 mL Mouth Rinse QID  . chlorhexidine  15 mL Mouth Rinse BID  . cloNIDine  0.3 mg Transdermal Weekly  . feeding supplement  30 mL Per Tube BID  . free water  400 mL Per Tube Q4H  . insulin aspart  0-9 Units  Subcutaneous Q4H  . insulin glargine  28 Units Subcutaneous Daily  . metoprolol  5 mg Intravenous Q6H  . phenytoin (DILANTIN) IV  175 mg Intravenous Q12H  . piperacillin-tazobactam (ZOSYN)  IV  3.375 g Intravenous Q8H  . polyvinyl alcohol  1 drop Left Eye BID  . sodium chloride  10-40 mL Intracatheter Q12H  . vancomycin  750 mg Intravenous Q24H   Continuous Infusions: . dextrose 100 mL/hr at 07/12/12 2114  . 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

## 2012-07-15 NOTE — Progress Notes (Signed)
ANTIBIOTIC CONSULT NOTE - FOLLOW UP  Pharmacy Consult for Vancomycin and Zosyn Indication: aspiration pneumonia  No Known Allergies  Patient Measurements: Height: 5\' 11"  (180.3 cm) Weight: 155 lb 4.8 oz (70.444 kg) IBW/kg (Calculated) : 75.3  Vital Signs: Temp: 98.6 F (37 C) (07/06 1029) Temp src: Rectal (07/06 1029) BP: 191/118 mmHg (07/06 1029) Pulse Rate: 95 (07/06 1029) Intake/Output from previous day: 07/05 0701 - 07/06 0700 In: -  Out: 1801 [Urine:1800; Stool:1] Intake/Output from this shift:    Labs:  Recent Labs  07/13/12 1508 07/14/12 0012 07/14/12 0540 07/15/12 0513  WBC 16.0*  --  14.9* 15.1*  HGB 12.1*  --  13.3 13.6  PLT 153  --  153 111*  CREATININE 2.28* 2.07* 2.16* 1.91*   Estimated Creatinine Clearance: 42.5 ml/min (by C-G formula based on Cr of 1.91). No results found for this basename: VANCOTROUGH, Leodis Binet, VANCORANDOM, GENTTROUGH, GENTPEAK, GENTRANDOM, TOBRATROUGH, TOBRAPEAK, TOBRARND, AMIKACINPEAK, AMIKACINTROU, AMIKACIN,  in the last 72 hours   Microbiology: Recent Results (from the past 720 hour(s))  MRSA PCR SCREENING     Status: None   Collection Time    06/21/12  9:46 PM      Result Value Range Status   MRSA by PCR NEGATIVE  NEGATIVE Final   Comment:            The GeneXpert MRSA Assay (FDA     approved for NASAL specimens     only), is one component of a     comprehensive MRSA colonization     surveillance program. It is not     intended to diagnose MRSA     infection nor to guide or     monitor treatment for     MRSA infections.  CULTURE, RESPIRATORY (NON-EXPECTORATED)     Status: None   Collection Time    06/24/12 11:50 AM      Result Value Range Status   Specimen Description TRACHEAL ASPIRATE   Final   Special Requests NONE   Final   Gram Stain     Final   Value: FEW WBC PRESENT,BOTH PMN AND MONONUCLEAR     RARE SQUAMOUS EPITHELIAL CELLS PRESENT     FEW GRAM POSITIVE COCCI     IN PAIRS   Culture     Final   Value:  ABUNDANT STAPHYLOCOCCUS AUREUS     Note: RIFAMPIN AND GENTAMICIN SHOULD NOT BE USED AS SINGLE DRUGS FOR TREATMENT OF STAPH INFECTIONS.   Report Status 06/27/2012 FINAL   Final   Organism ID, Bacteria STAPHYLOCOCCUS AUREUS   Final  URINE CULTURE     Status: None   Collection Time    07/08/12  3:00 PM      Result Value Range Status   Specimen Description URINE, RANDOM   Final   Special Requests NONE   Final   Culture  Setup Time 07/08/2012 21:02   Final   Colony Count >=100,000 COLONIES/ML   Final   Culture     Final   Value: ENTEROBACTER CLOACAE     Note: THIS ISOLATE HAS BEEN CONFIRMED AS A KPC CARBAPENEMASE PRODUCER     PSEUDOMONAS AERUGINOSA   Report Status 07/13/2012 FINAL   Final   Organism ID, Bacteria ENTEROBACTER CLOACAE   Final   Organism ID, Bacteria PSEUDOMONAS AERUGINOSA   Final  CULTURE, BLOOD (ROUTINE X 2)     Status: None   Collection Time    07/10/12  2:45 PM      Result  Value Range Status   Specimen Description BLOOD RIGHT HAND   Final   Special Requests BOTTLES DRAWN AEROBIC ONLY 4CC   Final   Culture  Setup Time 07/10/2012 20:49   Final   Culture     Final   Value:        BLOOD CULTURE RECEIVED NO GROWTH TO DATE CULTURE WILL BE HELD FOR 5 DAYS BEFORE ISSUING A FINAL NEGATIVE REPORT   Report Status PENDING   Incomplete  CULTURE, BLOOD (ROUTINE X 2)     Status: None   Collection Time    07/10/12  2:50 PM      Result Value Range Status   Specimen Description BLOOD LEFT HAND   Final   Special Requests BOTTLES DRAWN AEROBIC ONLY 4CC   Final   Culture  Setup Time 07/10/2012 20:49   Final   Culture     Final   Value:        BLOOD CULTURE RECEIVED NO GROWTH TO DATE CULTURE WILL BE HELD FOR 5 DAYS BEFORE ISSUING A FINAL NEGATIVE REPORT   Report Status PENDING   Incomplete    Anti-infectives   Start     Dose/Rate Route Frequency Ordered Stop   07/13/12 1400  vancomycin (VANCOCIN) IVPB 750 mg/150 ml premix     750 mg 150 mL/hr over 60 Minutes Intravenous Every 24  hours 07/12/12 1402     07/11/12 1300  piperacillin-tazobactam (ZOSYN) IVPB 3.375 g     3.375 g 12.5 mL/hr over 240 Minutes Intravenous Every 8 hours 07/11/12 1250     07/11/12 1300  vancomycin (VANCOCIN) IVPB 1000 mg/200 mL premix     1,000 mg 200 mL/hr over 60 Minutes Intravenous Every 24 hours 07/11/12 1250 07/12/12 1455   07/10/12 2000  levofloxacin (LEVAQUIN) IVPB 750 mg  Status:  Discontinued     750 mg 100 mL/hr over 90 Minutes Intravenous Every 48 hours 07/10/12 1843 07/11/12 1324   06/27/12 2000  ceFAZolin (ANCEF) IVPB 2 g/50 mL premix  Status:  Discontinued     2 g 100 mL/hr over 30 Minutes Intravenous 3 times per day 06/27/12 1758 07/06/12 1015   06/26/12 1100  vancomycin (VANCOCIN) IVPB 750 mg/150 ml premix  Status:  Discontinued     750 mg 150 mL/hr over 60 Minutes Intravenous Every 12 hours 06/26/12 0950 06/27/12 1756      Assessment: 57 year old male on Day #5 of Vancomycin and Zosyn for aspiration pneumonia.  His latest urine culture grew Pseudomonas and Enterobacter.  The Enterobacter isolate is a confirmed KPC-producer by lab report, but the susceptibilities (sensitive to Cefepime, Ceftaz, Pip/Tazo, Imipenem) are not concordant with a KPC.  Discussed this result with Dr. Rito Ehrlich - will add Levaquin based on susceptibilities. His creatinine is stable but remains elevated.  Goal of Therapy:  Vancomycin trough level 15-20 mcg/ml  Plan:  Continue Vancomycin 750mg  IV q24h and Zosyn 3.375gm IV q8h extended infusion Start Levaquin 750mg  IV q48h Check Vancomycin trough 7/7. Monitor renal function and culture results.  Estella Husk, Pharm.D., BCPS, AAHIVP Clinical Pharmacist Phone: [phone] or [phone] 07/15/2012, 11:34 AM

## 2012-07-16 ENCOUNTER — Encounter (HOSPITAL_COMMUNITY): Payer: Self-pay | Admitting: Radiology

## 2012-07-16 DIAGNOSIS — I6350 Cerebral infarction due to unspecified occlusion or stenosis of unspecified cerebral artery: Secondary | ICD-10-CM

## 2012-07-16 DIAGNOSIS — N179 Acute kidney failure, unspecified: Secondary | ICD-10-CM

## 2012-07-16 DIAGNOSIS — Z515 Encounter for palliative care: Secondary | ICD-10-CM

## 2012-07-16 DIAGNOSIS — R4789 Other speech disturbances: Secondary | ICD-10-CM

## 2012-07-16 DIAGNOSIS — I10 Essential (primary) hypertension: Secondary | ICD-10-CM

## 2012-07-16 LAB — CBC
HCT: 40.9 % (ref 39.0–52.0)
Hemoglobin: 12 g/dL — ABNORMAL LOW (ref 13.0–17.0)
MCH: 20.5 pg — ABNORMAL LOW (ref 26.0–34.0)
MCHC: 29.3 g/dL — ABNORMAL LOW (ref 30.0–36.0)
RBC: 5.84 MIL/uL — ABNORMAL HIGH (ref 4.22–5.81)

## 2012-07-16 LAB — CULTURE, BLOOD (ROUTINE X 2): Culture: NO GROWTH

## 2012-07-16 LAB — BASIC METABOLIC PANEL
BUN: 43 mg/dL — ABNORMAL HIGH (ref 6–23)
CO2: 25 mEq/L (ref 19–32)
GFR calc non Af Amer: 47 mL/min — ABNORMAL LOW (ref >90)
Glucose, Bld: 231 mg/dL — ABNORMAL HIGH (ref 70–99)
Potassium: 4.2 mEq/L (ref 3.5–5.1)

## 2012-07-16 LAB — GLUCOSE, CAPILLARY
Glucose-Capillary: 202 mg/dL — ABNORMAL HIGH (ref 70–99)
Glucose-Capillary: 210 mg/dL — ABNORMAL HIGH (ref 70–99)

## 2012-07-16 MED ORDER — INSULIN GLARGINE 100 UNIT/ML ~~LOC~~ SOLN
30.0000 [IU] | Freq: Every day | SUBCUTANEOUS | Status: DC
  Administered 2012-07-16 – 2012-07-17 (×2): 30 [IU] via SUBCUTANEOUS
  Filled 2012-07-16 (×2): qty 0.3

## 2012-07-16 MED ORDER — LOPERAMIDE HCL 2 MG PO CAPS
2.0000 mg | ORAL_CAPSULE | ORAL | Status: DC | PRN
  Filled 2012-07-16: qty 1

## 2012-07-16 NOTE — Progress Notes (Signed)
NUTRITION FOLLOW UP  Intervention:   1. Continue Glucerna 1.2 @ 60 ml/hr, 30 ml Pro-stat BID, and free water of 400 ml q 4 hrs.   Nutrition Dx:   Inadequate oral intake related to inability to eat as evidenced by NPO status, ongoing  Goal:   TF to meet > 90% of estimated nutrition needs, met  Monitor:   TF regimen & tolerance, weight, labs, I/O's  Assessment:   Patient with left CMA, received t-PA, CT on 6/13 showed new left ICH with midline shift.  Patient extubated 6/27.  Transferred out of Neuro ICU.    Comfort care presented to wife and family who have decided on continued aggressive care.  Continues with NG tube, planned for PEG placement today.  Current TF regimen is Glucerna 1.2 @60  ml/hr and 30 ml Pro-stat BID. With 400 ml free water q 4 hr. This is providing 1928 kcal, 116 gm protein, and 3559 ml free water daily.   Possible d/c to [facility] once able.   Height: Ht Readings from Last 1 Encounters:  06/22/12 5\' 11"  (1.803 m)    Weight Status  Wt Readings from Last 1 Encounters:  07/15/12 155 lb 4.8 oz (70.444 kg)    Re-estimated needs:  Kcal: 1800-2000 Protein: 90-110 gm Fluid: 1.8-2.0 L  Skin: Intact  Diet Order: NPO   Intake/Output Summary (Last 24 hours) at 07/16/12 1039 Last data filed at 07/16/12 0800  Gross per 24 hour  Intake   1170 ml  Output   1552 ml  Net   -382 ml    Last BM: 7/4   Labs:   Recent Labs Lab 07/11/12 1622  07/14/12 0540 07/15/12 0513 07/16/12 0500  NA 171*  < > 166* 160* 151*  K 4.3  < > 3.8 4.5 4.2  CL >130*  < > 129* 123* 116*  CO2 23  < > 23 27 25   BUN 89*  < > 54* 49* 43*  CREATININE 2.48*  < > 2.16* 1.91* 1.57*  CALCIUM 8.4  < > 8.4 8.4 8.2*  MG 3.0*  --   --   --   --   GLUCOSE 410*  < > 187* 203* 231*  < > = values in this interval not displayed.  CBG (last 3)   Recent Labs  07/15/12 2351 07/16/12 0410 07/16/12 0755  GLUCAP 176* 208* 202*   Lab Results  Component Value Date   HGBA1C 6.2*  06/24/2012    Scheduled Meds: . antiseptic oral rinse  15 mL Mouth Rinse QID  . chlorhexidine  15 mL Mouth Rinse BID  . cloNIDine  0.3 mg Transdermal Weekly  . feeding supplement  30 mL Per Tube BID  . free water  400 mL Per Tube Q4H  . insulin aspart  0-9 Units Subcutaneous Q4H  . insulin glargine  30 Units Subcutaneous Daily  . levofloxacin (LEVAQUIN) IV  750 mg Intravenous Q48H  . metoprolol  5 mg Intravenous Q6H  . phenytoin (DILANTIN) IV  175 mg Intravenous Q12H  . piperacillin-tazobactam (ZOSYN)  IV  3.375 g Intravenous Q8H  . polyvinyl alcohol  1 drop Left Eye BID  . sodium chloride  10-40 mL Intracatheter Q12H  . vancomycin  750 mg Intravenous Q24H    Continuous Infusions: . dextrose 100 mL/hr at 07/16/12 0532  . feeding supplement (GLUCERNA 1.2 CAL) 1,000 mL (07/16/12 0530)   Clarene Duke RD, LDN Pager [phone] After Hours pager [phone]

## 2012-07-16 NOTE — Progress Notes (Signed)
Inpatient Diabetes Program Recommendations  AACE/ADA: New Consensus Statement on Inpatient Glycemic Control (2013)  Target Ranges:  Prepandial:   less than 140 mg/dL      Peak postprandial:   less than 180 mg/dL (1-2 hours)      Critically ill patients:  140 - 180 mg/dL   Reason for Visit: Results for [name], [name] (MRN [id number]) as of 07/16/2012 14:17  Ref. Range 07/15/2012 20:11 07/15/2012 23:51 07/16/2012 04:10 07/16/2012 07:55 07/16/2012 11:50  Glucose-Capillary Latest Range: 70-99 mg/dL 191 (H) 478 (H) 295 (H) 202 (H) 210 (H)    Inpatient Diabetes Program Recommendations Insulin - Meal Coverage: consider adding Novolog 3 units q 4 hours for tube feed coverage

## 2012-07-16 NOTE — H&P (Signed)
[name] is an 57 y.o. male.   Chief Complaint: previous CVA 2009 New speech and weakness sxs 06/21/2012; +CVA Deemed candidate for tpa--hemorrhage Developed MSSA/aspiration PNA UTI; high fevers On 3 antibiotics now; BC neg Temp wnl; wbc 14 today Now with NG tube Declining; DNR Long term care soon Scheduled for percutaneous gastric tube placement 7/8 HPI: CVA; Malig htn; ARF; Sz; CAD/MI; DNR  Past Medical History  Diagnosis Date  . Stroke 2007  . Hypertension   . Difficult airway for intubation 06/22/2012    Deep and anterior. Difficult with standard laryngoscope. Easily intubated with Glidescope   . Coronary artery disease     Stent placement    Past Surgical History  Procedure Laterality Date  . Coronary stent placement    . Cardiac surgery      History reviewed. No pertinent family history. Social History:  reports that he has never smoked. He has never used smokeless tobacco. He reports that he does not drink alcohol. His drug history is not on file.  Allergies: No Known Allergies  Medications Prior to Admission  Medication Sig Dispense Refill  . atenolol (TENORMIN) 25 MG tablet Take 25 mg by mouth daily.      . hydrochlorothiazide (HYDRODIURIL) 25 MG tablet Take 25 mg by mouth daily.      Marland Kitchen losartan (COZAAR) 100 MG tablet Take 100 mg by mouth daily.      . sildenafil (VIAGRA) 100 MG tablet Take 100 mg by mouth daily as needed for erectile dysfunction.        Results for orders placed during the hospital encounter of 06/21/12 (from the past 48 hour(s))  GLUCOSE, CAPILLARY     Status: Abnormal   Collection Time    07/14/12 11:55 AM      Result Value Range   Glucose-Capillary 227 (*) 70 - 99 mg/dL  GLUCOSE, CAPILLARY     Status: Abnormal   Collection Time    07/14/12  4:08 PM      Result Value Range   Glucose-Capillary 181 (*) 70 - 99 mg/dL  GLUCOSE, CAPILLARY     Status: Abnormal   Collection Time    07/14/12  8:21 PM      Result Value Range   Glucose-Capillary 249 (*) 70 - 99 mg/dL   Comment 1 Documented in Chart     Comment 2 Notify RN    GLUCOSE, CAPILLARY     Status: Abnormal   Collection Time    07/15/12  1:05 AM      Result Value Range   Glucose-Capillary 190 (*) 70 - 99 mg/dL   Comment 1 Documented in Chart     Comment 2 Notify RN    GLUCOSE, CAPILLARY     Status: Abnormal   Collection Time    07/15/12  4:35 AM      Result Value Range   Glucose-Capillary 183 (*) 70 - 99 mg/dL   Comment 1 Documented in Chart     Comment 2 Notify RN    CBC     Status: Abnormal   Collection Time    07/15/12  5:13 AM      Result Value Range   WBC 15.1 (*) 4.0 - 10.5 K/uL   RBC 6.71 (*) 4.22 - 5.81 MIL/uL   Hemoglobin 13.6  13.0 - 17.0 g/dL   HCT 81.1  91.4 - 78.2 %   MCV 71.1 (*) 78.0 - 100.0 fL   MCH 20.3 (*) 26.0 - 34.0 pg  MCHC 28.5 (*) 30.0 - 36.0 g/dL   RDW 86.5 (*) 78.4 - 69.6 %   Platelets 111 (*) 150 - 400 K/uL   Comment: PLATELET COUNT CONFIRMED BY SMEAR  BASIC METABOLIC PANEL     Status: Abnormal   Collection Time    07/15/12  5:13 AM      Result Value Range   Sodium 160 (*) 135 - 145 mEq/L   Potassium 4.5  3.5 - 5.1 mEq/L   Chloride 123 (*) 96 - 112 mEq/L   CO2 27  19 - 32 mEq/L   Glucose, Bld 203 (*) 70 - 99 mg/dL   BUN 49 (*) 6 - 23 mg/dL   Creatinine, Ser 2.95 (*) 0.50 - 1.35 mg/dL   Calcium 8.4  8.4 - 28.4 mg/dL   GFR calc non Af Amer 37 (*) >90 mL/min   GFR calc Af Amer 43 (*) >90 mL/min   Comment:            The eGFR has been calculated     using the CKD EPI equation.     This calculation has not been     validated in all clinical     situations.     eGFR's persistently     <90 mL/min signify     possible Chronic Kidney Disease.  GLUCOSE, CAPILLARY     Status: Abnormal   Collection Time    07/15/12  8:32 AM      Result Value Range   Glucose-Capillary 179 (*) 70 - 99 mg/dL  GLUCOSE, CAPILLARY     Status: Abnormal   Collection Time    07/15/12 12:22 PM      Result Value Range    Glucose-Capillary 183 (*) 70 - 99 mg/dL  GLUCOSE, CAPILLARY     Status: Abnormal   Collection Time    07/15/12  3:47 PM      Result Value Range   Glucose-Capillary 255 (*) 70 - 99 mg/dL  GLUCOSE, CAPILLARY     Status: Abnormal   Collection Time    07/15/12  8:11 PM      Result Value Range   Glucose-Capillary 192 (*) 70 - 99 mg/dL  GLUCOSE, CAPILLARY     Status: Abnormal   Collection Time    07/15/12 11:51 PM      Result Value Range   Glucose-Capillary 176 (*) 70 - 99 mg/dL  GLUCOSE, CAPILLARY     Status: Abnormal   Collection Time    07/16/12  4:10 AM      Result Value Range   Glucose-Capillary 208 (*) 70 - 99 mg/dL  CBC     Status: Abnormal   Collection Time    07/16/12  5:00 AM      Result Value Range   WBC 14.3 (*) 4.0 - 10.5 K/uL   RBC 5.84 (*) 4.22 - 5.81 MIL/uL   Hemoglobin 12.0 (*) 13.0 - 17.0 g/dL   HCT 13.2  44.0 - 10.2 %   MCV 70.0 (*) 78.0 - 100.0 fL   MCH 20.5 (*) 26.0 - 34.0 pg   MCHC 29.3 (*) 30.0 - 36.0 g/dL   RDW 72.5 (*) 36.6 - 44.0 %   Platelets 114 (*) 150 - 400 K/uL   Comment: PLATELET COUNT CONFIRMED BY SMEAR  BASIC METABOLIC PANEL     Status: Abnormal   Collection Time    07/16/12  5:00 AM      Result Value Range   Sodium 151 (*) 135 -  145 mEq/L   Comment: DELTA CHECK NOTED   Potassium 4.2  3.5 - 5.1 mEq/L   Chloride 116 (*) 96 - 112 mEq/L   CO2 25  19 - 32 mEq/L   Glucose, Bld 231 (*) 70 - 99 mg/dL   BUN 43 (*) 6 - 23 mg/dL   Creatinine, Ser 1.47 (*) 0.50 - 1.35 mg/dL   Calcium 8.2 (*) 8.4 - 10.5 mg/dL   GFR calc non Af Amer 47 (*) >90 mL/min   GFR calc Af Amer 55 (*) >90 mL/min   Comment:            The eGFR has been calculated     using the CKD EPI equation.     This calculation has not been     validated in all clinical     situations.     eGFR's persistently     <90 mL/min signify     possible Chronic Kidney Disease.  GLUCOSE, CAPILLARY     Status: Abnormal   Collection Time    07/16/12  7:55 AM      Result Value Range    Glucose-Capillary 202 (*) 70 - 99 mg/dL   No results found.  Review of Systems  Constitutional: Positive for weight loss. Negative for fever.  Respiratory: Positive for sputum production.   Neurological: Positive for weakness. Negative for headaches.    Blood pressure 168/79, pulse 95, temperature 97.5 F (36.4 C), temperature source Axillary, resp. rate 20, height 5\' 11"  (1.803 m), weight 155 lb 4.8 oz (70.444 kg), SpO2 95.00%. Physical Exam  Constitutional: He appears well-developed.  Cardiovascular: Normal rate, regular rhythm and normal heart sounds.   No murmur heard. Respiratory: Effort normal. He has wheezes.  GI: Soft. Bowel sounds are normal. There is no tenderness.  Musculoskeletal: Normal range of motion.  Moves all 4s; random movements  Neurological:  Pt awake- non verbal CVA/ hemorrhage   Skin: Skin is warm and dry.  Psychiatric:  Consented wife for G tube     Assessment/Plan Prev CVA 2009 New CVA 06/21/12- tpa hemorrhage Asp pna (mssa); decline DNR; long term care soon Scheduled for G tube 7/8 pts wife aware of procedure benefits and risks and agreeable to proceed Consent signed and in chart Check kub Continue all antibiotics Check labs  Laine Giovanetti A 07/16/2012, 10:19 AM

## 2012-07-16 NOTE — Progress Notes (Signed)
Palliative Medicine Team Progress Note   S: Patient is more awake this AM. Not following my commands. Grimacing, slightly agitated. Wife sleeping at bedside.  Filed Vitals:   07/16/12 1457  BP: 156/88  Pulse: 76  Temp: 97.7 F (36.5 C)  Resp: 20   Retracts to pain, no meaningful communication. Rhonchi, copious oral secretions. Rigid neck and extremities.   Assessment:  13 man with large CVA and subsequent ICH after tPA administration. Very poor prognosis for meaningful neurologic recovery. At this time family is electing for aggressive interventions including PEG tube and if necessary tracheostomy with hopes that he will wake up and recover. They are actively grieving, but not open to further support or discussion. They have established their hopes for a miracle and expressed a need for more time-perhaps even months "to see what will happen".  Mrs. Hoelting was at bedside this AM after working all night long. She has high levels of stress, but is maintaining a hopeful vigil. Getting PEG placed tomorrow. Serum sodium coming down with free H2O. No fevers. I offered to assist with pain medication but she tells me he is getting them as needed.   Plan: Disposition will be difficult since wife has strong negative feelings about LTACH and SNF. Plan is for full medical interventions with intention to cure. No symptom management needs at this time. Goals have been established. If this patient's goals change or there are further needs for the PMT, please re-consult or call [phone]. This family needs time and space to process these tragic events-they report strong family and community church support.   Time: 25 minutes. Greater than 50%  of this time was spent counseling and coordinating care related to the above assessment and plan.   Anderson Malta, DO Palliative Medicine

## 2012-07-16 NOTE — Progress Notes (Signed)
Clarified with Dr Rito Ehrlich- pt is supposed to be getting PEG placed by IR tomorrow. Will give him barium for procedure today. Plans are to [facility] after [facility] is [facility]. Will continue to monitor. Euretha Najarro, [place], RN

## 2012-07-16 NOTE — Progress Notes (Signed)
Palliative Medicine Team SW Pt discussed in PMT rounds, referred for psychosocial support. Chart reviewed, note family's desire for continued aggressive measures at this time. No family present at time of visit, pt receiving personal care and unable to speak. Will follow up as needed.   Kennieth Francois, LCSWA PMT Phone [phone] Pager [phone]

## 2012-07-16 NOTE — Progress Notes (Signed)
[facility] PROGRESS NOTE  [name] JYN:[id number] DOB: [birth year] DOA: 06/21/2012 PCP: Erlinda Hong, MD  Assessment/Plan: Principal Problem:   CVA (cerebral infarction): Stroke service following. See below for disposition. No antithrombotic to hemorrhage.  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 At this time, the family would like aggressive measures. No hospice for now, good LTAC candidate.    Acute respiratory failure - status post extubation.    Cerebral edema with midline shift  Malignant hypertension: Pressures at times still elevated. When necessary hydralazine.    Acute renal insufficiency: Creatinine improving today.    Seizure, possible: On IV Dilantin, as per pharmacy recommendations-increasing dose.    Hypokalemia    AMI (acute myocardial infarction): See my cardiology, not candidate for intervention given massive CVA. On IV Lopressor.  Hyperglycemia: Continue Lantus and sliding scale. Sugars persistently elevated so increase Lantus. Suspect cause of this is additional issues such as infection and sodium levels. Increase Lantus on 7/4, CBGs better today    MSSA (methicillin susceptible Staphylococcus aureus) pneumonia: See below.    Fever: Felt to be secondary to recurrent aspiration    Aspiration pneumonia: Completed course of antibiotics for one pneumonia. However with persistent elevated white count and recurrent pneumonia. We started antibiotics. Suspected he also may be aspirated secretions, because at this point he has been n.p.o. since intubation. He is receiving feeding through PEG tube. If he is indeed aspirating secretions, there is little we can do. Hopefully his swallowing will improve with more alertness once to improve his sodium level. For PEG tube next week. White blood cell count started to come down.    Hypernatremia: Has now been on D5W for several days.  Sodium level coming down today.. May be in part from dehydration and volume concentration and also in part from neurologic  UTI: Grip Pseudomonas and Enterobacter. Discussed with pharmacy today. Enterobacter actually showing some resistance so as per pharmacy recommendations, Levaquin added on 7/6   Code Status: DO NOT RESUSCITATE  Family Communication: Discussed with wife at the bedside today  Disposition Plan: Palliative care had goals of care this past weekend. Family understands everything. For now they want everything done, however patient is DO NOT RESUSCITATE. We'll plan to continue antibiotics and aggressively treat with free water. Monitor status and fever. Likely transfer to LTAC next week.   Consultants:  Stroke service-primary attending. Hospitalists assumed care on 7/3  Critical care-ICU medical management. Signed off  [place]-cardiology  Palliative care  Procedures: Echocardiogram done 6/14: Left ventricle: The cavity size was normal. There was moderate concentric hypertrophy. Systolic function was normal. The estimated ejection fraction was in the range of 60% to 65%. Possible mild hypokinesis of the apical myocardium. - Aortic valve: Trivial regurgitation. - Mitral valve: Moderate regurgitation. - Left atrium: The atrium was mildly dilated. - Right ventricle: There was moderate hypertrophy. - Right atrium: The atrium was mildly dilated.   Carotid Dopplers on 6/14:0-39% stenosis bilaterally  Antibiotics:  Previously completed seven-day course of IV antibiotics.  IV vancomycin started 7/2  IV Zosyn started 7/2  IV Levaquin started 7/6  HPI/Subjective: Patient awake today, tracks me with his eyes. Does not follow commands or respond to his name.  Objective: Filed Vitals:   07/15/12 1900 07/15/12 2149 07/16/12 0135 07/16/12 0523  BP: 165/90 169/89 140/104 168/79  Pulse: 93 92 78 95  Temp: 99.5 F (37.5 C)  98.2 F (36.8 C) 97.5 F (36.4 C)  TempSrc: Axillary  Axillary Axillary  Resp: 19 20 20 20   Height:      Weight:      SpO2: 99% 92% 96% 95%    Intake/Output Summary (Last 24 hours) at 07/16/12 0944 Last data filed at 07/16/12 0800  Gross per 24 hour  Intake   1170 ml  Output   1552 ml  Net   -382 ml   Filed Weights   07/12/12 0735 07/14/12 0500 07/15/12 0500  Weight: 63.9 kg (140 lb 14 oz) 69.911 kg (154 lb 2 oz) 70.444 kg (155 lb 4.8 oz)    Exam:   General:  Awake but not alert. Does not follow commands  Cardiovascular: Regular rate and rhythm, S1-S2  Respiratory: Few rales  Abdomen: Soft, nondistended, hypoactive bowel sounds  Musculoskeletal: No clubbing or cyanosis, trace edema   Data Reviewed: Basic Metabolic Panel:  Recent Labs Lab 07/11/12 1622  07/13/12 1508 07/14/12 0012 07/14/12 0540 07/15/12 0513 07/16/12 0500  NA 171*  < > 164* 165* 166* 160* 151*  K 4.3  < > 4.0 4.0 3.8 4.5 4.2  CL >130*  < > 128* 129* 129* 123* 116*  CO2 23  < > 22 26 23 27 25   GLUCOSE 410*  < > 299* 177* 187* 203* 231*  BUN 89*  < > 62* 56* 54* 49* 43*  CREATININE 2.48*  < > 2.28* 2.07* 2.16* 1.91* 1.57*  CALCIUM 8.4  < > 8.2* 8.2* 8.4 8.4 8.2*  MG 3.0*  --   --   --   --   --   --   < > = values in this interval not displayed. Liver Function Tests: No results found for this basename: AST, ALT, ALKPHOS, BILITOT, PROT, ALBUMIN,  in the last 168 hours CBC:  Recent Labs Lab 07/12/12 1215 07/13/12 1508 07/14/12 0540 07/15/12 0513 07/16/12 0500  WBC 18.6* 16.0* 14.9* 15.1* 14.3*  HGB 12.5* 12.1* 13.3 13.6 12.0*  HCT 43.0 41.6 46.2 47.7 40.9  MCV 70.0* 70.4* 70.4* 71.1* 70.0*  PLT 169 153 153 111* 114*   CBG:  Recent Labs Lab 07/15/12 1547 07/15/12 2011 07/15/12 2351 07/16/12 0410 07/16/12 0755  GLUCAP 255* 192* 176* 208* 202*    Recent Results (from the past 240 hour(s))  URINE CULTURE     Status: None   Collection Time    07/08/12  3:00 PM      Result Value Range Status   Specimen  Description URINE, RANDOM   Final   Special Requests NONE   Final   Culture  Setup Time 07/08/2012 21:02   Final   Colony Count >=100,000 COLONIES/ML   Final   Culture     Final   Value: ENTEROBACTER CLOACAE     Note: THIS ISOLATE HAS BEEN CONFIRMED AS A [facility] CARBAPENEMASE PRODUCER     PSEUDOMONAS AERUGINOSA   Report Status 07/13/2012 FINAL   Final   Organism ID, Bacteria ENTEROBACTER CLOACAE   Final   Organism ID, Bacteria PSEUDOMONAS AERUGINOSA   Final  CULTURE, BLOOD (ROUTINE X 2)     Status: None   Collection Time    07/10/12  2:45 PM      Result Value Range Status   Specimen Description BLOOD RIGHT HAND   Final   Special Requests BOTTLES DRAWN AEROBIC ONLY 4CC   Final   Culture  Setup Time 07/10/2012 20:49   Final   Culture NO GROWTH 5 DAYS   Final   Report Status  07/16/2012 FINAL   Final  CULTURE, BLOOD (ROUTINE X 2)     Status: None   Collection Time    07/10/12  2:50 PM      Result Value Range Status   Specimen Description BLOOD LEFT HAND   Final   Special Requests BOTTLES DRAWN AEROBIC ONLY 4CC   Final   Culture  Setup Time 07/10/2012 20:49   Final   Culture NO GROWTH 5 DAYS   Final   Report Status 07/16/2012 FINAL   Final     Studies: Dg Chest Port 1 View  07/11/2012    IMPRESSION: No acute findings.   Original Report Authenticated By: Leanna Battles, M.D.    Scheduled Meds: . antiseptic oral rinse  15 mL Mouth Rinse QID  . chlorhexidine  15 mL Mouth Rinse BID  . cloNIDine  0.3 mg Transdermal Weekly  . feeding supplement  30 mL Per Tube BID  . free water  400 mL Per Tube Q4H  . insulin aspart  0-9 Units Subcutaneous Q4H  . insulin glargine  28 Units Subcutaneous Daily  . levofloxacin (LEVAQUIN) IV  750 mg Intravenous Q48H  . metoprolol  5 mg Intravenous Q6H  . phenytoin (DILANTIN) IV  175 mg Intravenous Q12H  . piperacillin-tazobactam (ZOSYN)  IV  3.375 g Intravenous Q8H  . polyvinyl alcohol  1 drop Left Eye BID  . sodium chloride  10-40 mL Intracatheter Q12H   . vancomycin  750 mg Intravenous Q24H   Continuous Infusions: . dextrose 100 mL/hr at 07/16/12 0532  . 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

## 2012-07-17 ENCOUNTER — Inpatient Hospital Stay (HOSPITAL_COMMUNITY): Payer: Medicare Other

## 2012-07-17 DIAGNOSIS — I10 Essential (primary) hypertension: Secondary | ICD-10-CM

## 2012-07-17 LAB — BASIC METABOLIC PANEL
CO2: 24 mEq/L (ref 19–32)
Calcium: 8.2 mg/dL — ABNORMAL LOW (ref 8.4–10.5)
Chloride: 113 mEq/L — ABNORMAL HIGH (ref 96–112)
Glucose, Bld: 119 mg/dL — ABNORMAL HIGH (ref 70–99)
Potassium: 3.3 mEq/L — ABNORMAL LOW (ref 3.5–5.1)
Sodium: 148 mEq/L — ABNORMAL HIGH (ref 135–145)

## 2012-07-17 LAB — GLUCOSE, CAPILLARY
Glucose-Capillary: 107 mg/dL — ABNORMAL HIGH (ref 70–99)
Glucose-Capillary: 119 mg/dL — ABNORMAL HIGH (ref 70–99)
Glucose-Capillary: 120 mg/dL — ABNORMAL HIGH (ref 70–99)

## 2012-07-17 LAB — CBC
Hemoglobin: 12.2 g/dL — ABNORMAL LOW (ref 13.0–17.0)
MCH: 20.4 pg — ABNORMAL LOW (ref 26.0–34.0)
RBC: 5.98 MIL/uL — ABNORMAL HIGH (ref 4.22–5.81)
WBC: 13.1 10*3/uL — ABNORMAL HIGH (ref 4.0–10.5)

## 2012-07-17 MED ORDER — GLUCERNA 1.2 CAL PO LIQD: 1000.0000 mL | ORAL | Status: DC

## 2012-07-17 MED ORDER — PIPERACILLIN-TAZOBACTAM 3.375 G IVPB: 3.3750 g | Freq: Three times a day (TID) | INTRAVENOUS | Status: AC

## 2012-07-17 MED ORDER — CLONIDINE HCL 0.3 MG/24HR TD PTWK: 1.0000 | MEDICATED_PATCH | TRANSDERMAL | Status: DC

## 2012-07-17 MED ORDER — LORAZEPAM 2 MG/ML IJ SOLN: 1.0000 mg | INTRAMUSCULAR | Status: DC | PRN

## 2012-07-17 MED ORDER — BIOTENE DRY MOUTH MT LIQD: 15.0000 mL | Freq: Four times a day (QID) | OROMUCOSAL | Status: DC

## 2012-07-17 MED ORDER — LEVOFLOXACIN IN D5W 750 MG/150ML IV SOLN: 750.0000 mg | INTRAVENOUS | Status: AC

## 2012-07-17 MED ORDER — CHLORHEXIDINE GLUCONATE 0.12 % MT SOLN: 15.0000 mL | Freq: Two times a day (BID) | OROMUCOSAL | Status: DC

## 2012-07-17 MED ORDER — FENTANYL CITRATE 0.05 MG/ML IJ SOLN: 50.0000 ug | INTRAMUSCULAR | Status: DC | PRN

## 2012-07-17 MED ORDER — FREE WATER: 400.0000 mL | Status: DC

## 2012-07-17 MED ORDER — METOPROLOL TARTRATE 1 MG/ML IV SOLN: 2.5000 mg | INTRAVENOUS | Status: DC | PRN

## 2012-07-17 MED ORDER — VANCOMYCIN HCL IN DEXTROSE 750-5 MG/150ML-% IV SOLN: 750.0000 mg | INTRAVENOUS | Status: DC

## 2012-07-17 MED ORDER — MIDAZOLAM HCL 2 MG/2ML IJ SOLN
INTRAMUSCULAR | Status: AC | PRN
  Administered 2012-07-17: 1 mg via INTRAVENOUS
  Administered 2012-07-17: 2 mg via INTRAVENOUS

## 2012-07-17 MED ORDER — IOHEXOL 300 MG/ML  SOLN
50.0000 mL | Freq: Once | INTRAMUSCULAR | Status: AC | PRN
  Administered 2012-07-17: 20 mL

## 2012-07-17 MED ORDER — INSULIN GLARGINE 100 UNIT/ML ~~LOC~~ SOLN: 30.0000 [IU] | Freq: Every day | SUBCUTANEOUS | Status: DC

## 2012-07-17 MED ORDER — ALBUTEROL SULFATE (5 MG/ML) 0.5% IN NEBU: 2.5000 mg | INHALATION_SOLUTION | RESPIRATORY_TRACT | Status: DC | PRN

## 2012-07-17 MED ORDER — FENTANYL CITRATE 0.05 MG/ML IJ SOLN
INTRAMUSCULAR | Status: AC | PRN
  Administered 2012-07-17 (×2): 50 ug via INTRAVENOUS

## 2012-07-17 MED ORDER — FENTANYL CITRATE 0.05 MG/ML IJ SOLN
INTRAMUSCULAR | Status: AC
  Filled 2012-07-17: qty 4

## 2012-07-17 MED ORDER — ACETAMINOPHEN 325 MG PO TABS: 650.0000 mg | ORAL_TABLET | ORAL | Status: DC | PRN

## 2012-07-17 MED ORDER — MIDAZOLAM HCL 2 MG/2ML IJ SOLN
INTRAMUSCULAR | Status: AC
  Filled 2012-07-17: qty 4

## 2012-07-17 MED ORDER — HYDRALAZINE HCL 20 MG/ML IJ SOLN
10.0000 mg | Freq: Three times a day (TID) | INTRAMUSCULAR | Status: DC
  Administered 2012-07-17: 10 mg via INTRAVENOUS
  Filled 2012-07-17 (×2): qty 1

## 2012-07-17 MED ORDER — METOPROLOL TARTRATE 1 MG/ML IV SOLN: 5.0000 mg | Freq: Four times a day (QID) | INTRAVENOUS | Status: DC

## 2012-07-17 MED ORDER — LOPERAMIDE HCL 2 MG PO CAPS: 2.0000 mg | ORAL_CAPSULE | ORAL | Status: DC | PRN

## 2012-07-17 MED ORDER — ARTIFICIAL TEARS OP OINT: TOPICAL_OINTMENT | OPHTHALMIC | Status: DC | PRN

## 2012-07-17 MED ORDER — INSULIN ASPART 100 UNIT/ML ~~LOC~~ SOLN: 0.0000 [IU] | SUBCUTANEOUS | Status: DC

## 2012-07-17 MED ORDER — PRO-STAT SUGAR FREE PO LIQD: 30.0000 mL | Freq: Two times a day (BID) | ORAL | Status: DC

## 2012-07-17 MED ORDER — HYDRALAZINE HCL 20 MG/ML IJ SOLN: 10.0000 mg | Freq: Three times a day (TID) | INTRAMUSCULAR | Status: DC

## 2012-07-17 MED ORDER — PHENYTOIN SODIUM 50 MG/ML IJ SOLN: 175.0000 mg | Freq: Two times a day (BID) | INTRAMUSCULAR | Status: DC

## 2012-07-17 NOTE — Discharge Summary (Addendum)
Physician Discharge Summary  [name] ZOX:[id number] DOB: 1955/08/15 DOA: 06/21/2012  PCP: Erlinda Hong, MD  Admit date: 06/21/2012 Discharge date: 07/17/2012  Time spent: 40 minutes  Recommendations for Outpatient Follow-up:  1. Patient is being transferred to [facility] 2. He will continue on aggressive free water until his sodium and renal function has normalized 3. Speech therapy will follow him check a swallow evaluation as appropriate 4. Patient receive physical therapy as appropriate as dictated by LTAC 5. Patient will continue on IV Levaquin, vancomycin and Zosyn until 07/22/12 6. Patient's IV medications: Dilantin and antihypertensive medications will be transitioned to pill form through PEG tube as per LTAC primary physician 7. Patient has a stage II decubitus ulcer on his buttocks. Use hydrocolloid dressing changes daily. 8. Patient's PEG tube will be ready for use after 2 PM on Wednesday 25-Jul-2012.  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   Acute respiratory failure - intubated for AMS   Cerebral edema with midline shift   ARF (acute renal failure)   Hypertensive crisis with hemorrhagic CVA   Seizure, possible   Hypokalemia   AMI (acute myocardial infarction)   MSSA (methicillin susceptible Staphylococcus aureus) pneumonia   Fever   Aspiration pneumonia   Hypernatremia   Dysphasia   Malignant hypertension   Discharge Condition: Stable, being transferred to LTAC today  Diet recommendation: Glucerna 1.2 Cal at 60 mL per hour continuous Also pro stat sugarfree liquid 30 mL as 2 times a day  Filed Weights   07/15/12 0500 07/17/12 0500 07/17/12 0700  Weight: 70.444 kg (155 lb 4.8 oz) 71.986 kg (158 lb 11.2 oz) 65.409 kg (144 lb 3.2 oz)    History of present illness:  [name] is an 57 y.o. 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 NIH stroke score was 8. Patient had marked expressive aphasia as well as gaze preference to the left side and right visual field defect to visual confrontation. He was administered TPA, subsequent hospitalization complicated by hemorrhagic transformation of CVA, interventricular hemorrhage, subarachnoid blood with midline shift, seizures , he was then intubated for airway protection and subsequently extubated on 6/26. In the internal was also completed a ten-day course of IV Ancef for MSSA pneumonia on 6/26. He was also started on 3% for cerebral edema which was then stopped on 6/30 however he continued to have persistent worsening hypernatremia and fevers. At this point, patient was transferred to the floor and on the stroke service. Hospitalists were consulted on 7/2 for assistance with hypernatremia and worsening fever. We can assume the care.   Hospital Course:  Principal Problem:   CVA (cerebral infarction): At this point, patient's overall prognosis is very limited. Given the hemorrhage, no anticoagulation or antithrombotic is recommended. Patient will get physical therapy at [facility], although it remains to be seen how much of a will to participate or what kind of mentation will improve.  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 With aggressive free water addition, creatinine slowly improving. Recommend continuing free water until creatinine and sodium near normal.    Hypertensive crisis with hemorrhagic CVA: See below.    Seizure, possible: Patient  is on IV Dilantin. Was PEG tube is placed, this can be transitioned to by mouth.    Hypokalemia: Secondary to IV fluids. Replace as needed.  Decubitus ulcer: Stage II on the back side. Patient is frequently turned, redo to absolutely no movement, has some skin breakdown. Use hydrocolloid dressing daily.    AMI (acute myocardial infarction): Elevated troponins during patient's time on ventilator. Felt to be stress ischemia. Recommendation was noted for no aggressive intervention and medical management only. Patient cannot receive any aspirin or anticoagulation secondary to hemorrhagic conversion.    MSSA (methicillin susceptible Staphylococcus aureus) pneumonia: Initial pneumonia was cultured but this.    Fever: Felt to be secondary to Enterobacter UTI and recurrent aspiration.    Aspiration pneumonia: Patient completed one course of aspiration pneumonia already. He doesn't have continued fevers and there was high suspicion that the patient is continuing to aspirate. After extensive discussion with the patient's family plus palliative care, they would like to continue to be deep aggressive. Have changed the patient's CODE STATUS to DO NOT RESUSCITATE. A new course of antibiotics with vancomycin and Zosyn has been restarted. This will continue for total 10 days of therapy until 7/13. It is explained to the patient's family that patient likely will continue to aspirate despite having a feeding tube if he is aspirating on secretions. He wants to continue to explore aggressive options. They're like to see  How his swallowing ability improves once his sodium is normalized.    Hypernatremia: Secondary to combination of hypertonic saline plus dehydration and poor by mouth intake. The patient said he was as high as 177/2. Free water was increased to 400 mL's every 4 hours and over time, his sodium has slowly trended down and by day of discharge down to 150. Recommendations continue free water at this rate until  sodium has normalized.    Dysphasia: As mentioned above, patient unable to take anything by mouth. NG tube was placed and he's been on Glucerna tube feeds. He is scheduled for PEG tube today and at that point, Glucerna can be restarted and titrated up.    Malignant hypertension: Patient on clonidine patch plus IV hydralazine plus when necessary IV Lopressor. Was PEG tube is established, Lopressor and hydralazine can be changed to by mouth.   UTI: Patient grew out Enterobacter and Pseudomonas. Spoke with pharmacy. This Enterobacter was showing some resistance pharmacy recommended adding IV Levaquin. He'll continue this until 7/13 for total 7 days of therapy.   Procedures:  Carotid Dopplers on 6/14:0-39% stenosis bilaterally  Echocardiogram done 6/14: Left ventricle: The cavity size was normal. There was moderate concentric hypertrophy. Systolic function was normal. The estimated ejection fraction was in the range of 60% to 65%. Possible mild hypokinesis of the apical myocardium. - Aortic valve: Trivial regurgitation. - Mitral valve: Moderate regurgitation. - Left atrium: The atrium was mildly dilated. - Right ventricle: There was moderate hypertrophy. - Right atrium: The atrium was mildly dilated.   PEG tube placed 07/17/12 by interventional radiology  Consultants:  Stroke service-primary attending. Hospitalists assumed care on 7/3  Critical care-ICU medical management. Signed off  [place]-cardiology  Palliative care  Discharge Exam: Filed Vitals:   07/16/12 2300 07/17/12 0231 07/17/12 0500 07/17/12 0700  BP: 146/72 161/99  178/101  Pulse: 88  96  96  Temp: 98.1 F (36.7 C) 98.1 F (36.7 C)  98.3 F (36.8 C)  TempSrc: Oral Oral  Axillary  Resp: 18 18  18   Height:      Weight:   71.986 kg (158 lb 11.2 oz) 65.409 kg (144 lb 3.2 oz)  SpO2: 96% 97%  99%    General: Awake but not alert. Does not follow commands  Cardiovascular: Regular rate and rhythm, S1-S2  Respiratory: Few  rales  Abdomen: Soft, nondistended, hypoactive bowel sounds  Musculoskeletal: No clubbing or cyanosis, trace edema    Discharge Instructions     Medication List    STOP taking these medications       atenolol 25 MG tablet  Commonly known as:  TENORMIN     hydrochlorothiazide 25 MG tablet  Commonly known as:  HYDRODIURIL     losartan 100 MG tablet  Commonly known as:  COZAAR     sildenafil 100 MG tablet  Commonly known as:  VIAGRA      TAKE these medications       acetaminophen 325 MG tablet  Commonly known as:  TYLENOL  Place 2 tablets (650 mg total) into feeding tube every 4 (four) hours as needed.     albuterol (5 MG/ML) 0.5% nebulizer solution  Commonly known as:  PROVENTIL  Take 0.5 mLs (2.5 mg total) by nebulization every 2 (two) hours as needed for wheezing or shortness of breath.     antiseptic oral rinse Liqd  15 mLs by Mouth Rinse route QID.     artificial tears Oint ophthalmic ointment  Apply to eye every 4 (four) hours as needed.     chlorhexidine 0.12 % solution  Commonly known as:  PERIDEX  Use as directed 15 mLs in the mouth or throat 2 (two) times daily.     cloNIDine 0.3 mg/24hr  Commonly known as:  CATAPRES - Dosed in mg/24 hr  Place 1 patch (0.3 mg total) onto the skin once a week.     feeding supplement (GLUCERNA 1.2 CAL) Liqd  Place 1,000 mLs into feeding tube continuous.     feeding supplement Liqd  Place 30 mLs into feeding tube 2 (two) times daily at 10 AM and 5 PM.     fentaNYL 0.05 MG/ML injection  Commonly known as:  SUBLIMAZE  Inject 1-2 mLs (50-100 mcg total) into the vein every 2 (two) hours as needed for severe pain.     free water Soln  Place 400 mLs into feeding tube every 4 (four) hours.     hydrALAZINE 20 MG/ML injection  Commonly known as:  APRESOLINE  Inject 0.5 mLs (10 mg total) into the vein every 8 (eight) hours.     insulin aspart 100 UNIT/ML injection  Commonly known as:  novoLOG  Inject 0-9 Units into the  skin every 4 (four) hours.     insulin glargine 100 UNIT/ML injection  Commonly known as:  LANTUS  Inject 0.3 mLs (30 Units total) into the skin daily.     levofloxacin 750 MG/150ML Soln  Commonly known as:  LEVAQUIN  Inject 150 mLs (750 mg total) into the vein every other day.     loperamide 2 MG capsule  Commonly known as:  IMODIUM  Take 1 capsule (2 mg total) by mouth as needed for diarrhea or loose stools.     LORazepam 2 MG/ML injection  Commonly known as:  ATIVAN  Inject 0.5 mLs (1 mg total) into the vein  every 30 (thirty) minutes as needed for seizure.     metoprolol 1 MG/ML injection  Commonly known as:  LOPRESSOR  Inject 5 mLs (5 mg total) into the vein every 6 (six) hours.     metoprolol 1 MG/ML injection  Commonly known as:  LOPRESSOR  Inject 2.5-5 mLs (2.5-5 mg total) into the vein every 3 (three) hours as needed (to maintain HR < 115/min).     piperacillin-tazobactam 3.375 GM/50ML IVPB  Commonly known as:  ZOSYN  Inject 50 mLs (3.375 g total) into the vein every 8 (eight) hours.     sodium chloride 0.9 % SOLN 100 mL with phenytoin 50 MG/ML SOLN 175 mg  Inject 175 mg into the vein every 12 (twelve) hours.     Vancomycin 750 MG/150ML Soln  Commonly known as:  VANCOCIN  Inject 150 mLs (750 mg total) into the vein daily.       No Known Allergies    The results of significant diagnostics from this hospitalization (including imaging, microbiology, ancillary and laboratory) are listed below for reference.    Significant Diagnostic Studies: Dg Chest 2 View  06/21/2012     IMPRESSION: Moderate cardiomegaly.  No active disease.   Original Report Authenticated By: Irish Lack, M.D.   Ct Head Wo Contrast  07/03/2012     IMPRESSION: No significant change.  Blood in the occipital horn of the right lateral ventricle is more apparent.  Midline shift is essentially unchanged.  No new hemorrhage on the left.   Original Report Authenticated By: Francene Boyers, M.D.    Ct Head Wo Contrast  06/26/2012   .  IMPRESSION: Overall slight improvement in the degree of midline shift compared with priors.  No new areas of infarction.  No convincing signs for trapped right lateral ventricle on today's exam.  Stable multifocal predominantly left MCA territory infarcts with hemorrhagic transformation; no new infarcts or new lobar hemorrhage from priors.   Original Report Authenticated By: Davonna Belling, M.D.   Ct Head Wo Contrast  06/24/2012    IMPRESSION: Large left parietal - frontal - posterior left temporal lobe hemorrhagic infarct and mass effect upon the left lateral ventricle relatively similar to the recent exam.  Interval development of interventricular blood within the tract right lateral ventricle.  Subarachnoid blood most notable right convexity and right sylvian fissure more apparent than on the prior exam.  Bilateral anterior frontal lobe infarcts greater on the left with minimal associated petechial hemorrhage.  Critical Value/emergent results were called by telephone at the time of interpretation on 06/24/2012 at 7:20 a.m. to [facility] the patient's nurse , who verbally acknowledged these results.   Original Report Authenticated By: Lacy Duverney, M.D.   Ct Head Wo Contrast  06/23/2012     IMPRESSION: Minimal change in the large left hemorrhagic infarct.  Minimal change in the midline shift.   Original Report Authenticated By: Richarda Overlie, M.D.   Ct Head Wo Contrast  06/22/2012   **ADDENDUM** CREATED: 06/22/2012 08:58:34  Critical Value/emergent results were called by telephone at the time of interpretation on 06/22/2012 at 0858 hours to Dr.  Pearlean Brownie, who verbally acknowledged these results.  **END ADDENDUM** SIGNED BY: Dineen Kid. Chestine Spore, M.D.  06/22/2012   IMPRESSION: Image quality degraded by significant motion.  There is a large area of hemorrhage in the left parietal lobe with mass effect and 10 mm midline shift.  This is most consistent with hemorrhagic infarction following  TPA.   Original Report Authenticated By: Janeece Riggers,  M.D.   Ct Head Wo Contrast  06/21/2012   .  IMPRESSION: Chronic ischemic changes, right greater than left.  No acute abnormality.  Critical Value/emergent results were called by telephone at the time of interpretation on 06/21/2012 at 1950 hours to Dr. Roseanne Reno, who verbally acknowledged these results.   Original Report Authenticated By: Janeece Riggers, M.D.   Dg Chest Port 1 View  07/11/2012     IMPRESSION: No acute findings.   Original Report Authenticated By: Leanna Battles, M.D.   Dg Chest Port 1 View  07/10/2012  IMPRESSION: Prominent superior mediastinum, unchanged since prior exams. Minimal bronchitic changes without infiltrate.   Original Report Authenticated By: Ulyses Southward, M.D.   Dg Chest Port 1 View  07/08/2012     IMPRESSION: Interval improvement in the atelectasis at the bases.  Status post removal of support apparatus.  No additional interval changes.   Original Report Authenticated By: Sander Radon, M.D.   Dg Chest Port 1 View  07/06/2012   IMPRESSION: Areas of subsegmental atelectasis.  No change.   Original Report Authenticated By: Charlett Nose, M.D.   Dg Chest Port 1 View  07/05/2012    IMPRESSION: Bibasilar atelectasis, improving.   Original Report Authenticated By: Charlett Nose, M.D.   Dg Chest Port 1 View  07/03/2012     IMPRESSION: Bibasilar atelectasis.   Original Report Authenticated By: Charlett Nose, M.D.   Dg Chest Port 1 View  07/01/2012    IMPRESSION: Improved aeration bilaterally.   Original Report Authenticated By: Irish Lack, M.D.   Dg Chest Port 1 View  06/27/2012   IMPRESSION: Improved bibasilar aeration.   Original Report Authenticated By: Christiana Pellant, M.D.   Dg Chest Port 1 View  06/26/2012     IMPRESSION:  1.  Low inspiratory volumes with new patchy right basilar opacity. Differential considerations include developing infiltrate, asymmetric pulmonary edema, and potentially atelectasis although  this is less likely given the imaging appearance. 2.  Stable left retrocardiac opacity may reflect atelectasis or infiltrate. 3.  Pulmonary vascular congestion bordering on mild interstitial edema.   Original Report Authenticated By: Malachy Moan, M.D.   Dg Chest Port 1 View  06/24/2012     IMPRESSION: Stable cardiomegaly.  No acute findings.   Original Report Authenticated By: Myles Rosenthal, M.D.   Dg Chest Port 1 View  06/23/2012   IMPRESSION: 1.  Nasogastric tube placement least as far as the stomach. 2.  Interval increase in bilateral edema or infiltrates.   Original Report Authenticated By: D. Andria Rhein, MD   Dg Chest Port 1 View  06/22/2012   IMPRESSION: Tip of right jugular line projects over SVC without pneumothorax. Bibasilar atelectasis.   Original Report Authenticated By: Ulyses Southward, M.D.   Dg Abd Portable 1v  07/17/2012   .  IMPRESSION: Barium is noted throughout the colon.   Original Report Authenticated By: Lacy Duverney, M.D.   Dg Abd Portable 1v  07/08/2012   IMPRESSION:  1.  NG tube in good position. 2.  Improved appearance of small bowel obstruction.   Original Report Authenticated By: Holley Dexter, M.D.   Dg Abd Portable 1v  06/28/2012     IMPRESSION: Dilated small bowel loops could represent partial S B O versus ileus.  Correlate clinically.  Orogastric tube tip in good position.   Original Report Authenticated By: Davonna Belling, M.D.   Dg Abd Portable 1v  06/22/2012  .  IMPRESSION: Tip of nasogastric tube projects over the proximal to mid stomach.  Original Report Authenticated By: Ulyses Southward, M.D.   Ct Portable Head W/o Cm  06/22/2012    IMPRESSION: Large area of hemorrhagic infarction left parietal lobe is similar. There is improvement in blood in the corpus collosum/ caudate on the left.  8 mm midline shift to the right without hydrocephalus.   Original Report Authenticated By: Janeece Riggers, M.D.    Microbiology: Recent Results (from the past 240 hour(s))   URINE CULTURE     Status: None   Collection Time    07/08/12  3:00 PM      Result Value Range Status   Specimen Description URINE, RANDOM   Final   Special Requests NONE   Final   Culture  Setup Time 07/08/2012 21:02   Final   Colony Count >=100,000 COLONIES/ML   Final   Culture     Final   Value: ENTEROBACTER CLOACAE     Note: THIS ISOLATE HAS BEEN CONFIRMED AS A [facility] CARBAPENEMASE PRODUCER     PSEUDOMONAS AERUGINOSA   Report Status 07/13/2012 FINAL   Final   Organism ID, Bacteria ENTEROBACTER CLOACAE   Final   Organism ID, Bacteria PSEUDOMONAS AERUGINOSA   Final  CULTURE, BLOOD (ROUTINE X 2)     Status: None   Collection Time    07/10/12  2:45 PM      Result Value Range Status   Specimen Description BLOOD RIGHT HAND   Final   Special Requests BOTTLES DRAWN AEROBIC ONLY 4CC   Final   Culture  Setup Time 07/10/2012 20:49   Final   Culture NO GROWTH 5 DAYS   Final   Report Status 07/16/2012 FINAL   Final  CULTURE, BLOOD (ROUTINE X 2)     Status: None   Collection Time    07/10/12  2:50 PM      Result Value Range Status   Specimen Description BLOOD LEFT HAND   Final   Special Requests BOTTLES DRAWN AEROBIC ONLY 4CC   Final   Culture  Setup Time 07/10/2012 20:49   Final   Culture NO GROWTH 5 DAYS   Final   Report Status 07/16/2012 FINAL   Final     Labs: Basic Metabolic Panel:  Recent Labs Lab 07/11/12 1622  07/14/12 0012 07/14/12 0540 07/15/12 0513 07/16/12 0500 07/17/12 0545  NA 171*  < > 165* 166* 160* 151* 148*  K 4.3  < > 4.0 3.8 4.5 4.2 3.3*  CL >130*  < > 129* 129* 123* 116* 113*  CO2 23  < > 26 23 27 25 24   GLUCOSE 410*  < > 177* 187* 203* 231* 119*  BUN 89*  < > 56* 54* 49* 43* 36*  CREATININE 2.48*  < > 2.07* 2.16* 1.91* 1.57* 1.50*  CALCIUM 8.4  < > 8.2* 8.4 8.4 8.2* 8.2*  MG 3.0*  --   --   --   --   --   --   < > = values in this interval not displayed. Liver Function Tests: CBC:  Recent Labs Lab 07/13/12 1508 07/14/12 0540 07/15/12 0513  07/16/12 0500 07/17/12 0545  WBC 16.0* 14.9* 15.1* 14.3* 13.1*  HGB 12.1* 13.3 13.6 12.0* 12.2*  HCT 41.6 46.2 47.7 40.9 41.6  MCV 70.4* 70.4* 71.1* 70.0* 69.6*  PLT 153 153 111* 114* 132*   CBG:  Recent Labs Lab 07/16/12 1150 07/16/12 1611 07/16/12 2020 07/17/12 0020 07/17/12 0405  GLUCAP 210* 243* 166* 119* 105*       Signed:  Darron Stuck K  [facility] 07/17/2012, 8:52 AM

## 2012-07-17 NOTE — Progress Notes (Signed)
UR COMPLETED  

## 2012-07-17 NOTE — Procedures (Signed)
Interventional Radiology Procedure Note  Procedure: Placement of percutaneous 20F pull-through gastrostomy tube. Complications: None Recommendations: - NPO except for sips and chips remainder of today and overnight - Maintain G-tube to LWS until tomorrow morning  - May advance diet as tolerated and begin using tube tomorrow morning  Signed,  Bela Bonaparte K. Thelma Viana, MD Vascular & Interventional Radiologist [facility]  

## 2012-07-18 ENCOUNTER — Telehealth (HOSPITAL_COMMUNITY): Payer: Self-pay | Admitting: *Deleted

## 2012-07-18 LAB — URINE CULTURE

## 2012-07-18 NOTE — Telephone Encounter (Signed)
Post procedure follow up call attempt.  Rec'd recording at home number saying "this number does not accept incoming calls per customers request".  Mobile number listed is for a pager.  I did not attempt to reach pt at listed work number

## 2012-07-24 ENCOUNTER — Inpatient Hospital Stay (HOSPITAL_COMMUNITY)
Admission: RE | Admit: 2012-07-24 | Discharge: 2012-07-24 | Disposition: A | Discharge status: Home / Self Care | Payer: Medicare Other | Source: Ambulatory Visit | Attending: Internal Medicine | Admitting: Internal Medicine

## 2012-07-24 DIAGNOSIS — I6350 Cerebral infarction due to unspecified occlusion or stenosis of unspecified cerebral artery: Secondary | ICD-10-CM

## 2012-07-24 NOTE — Procedures (Signed)
History: 57 year old male with previous stroke  Background: The background consists mostly of generalized are regular delta activity with a paucity of faster frequencies. There is rarely a posterior rhythm seen on the right that is poorly formed and poorly maintained the frequency approximating 7.5 Hz. At times the delta activity is more prominent on the left.  Photic stimulation: Physiologic driving is absent  EEG Abnormalities: 1) focal left-sided slowing 2) generalized irregular delta activity 3) slow PDR  Clinical Interpretation: This EEG was consistent with a focal area of cerebral dysfunction of left side in the setting of a moderate nonspecific generalized cerebral dysfunction (encephalopathy). There was no seizure or seizure predisposition recorded on this study.   Ritta Slot, MD [facility] [phone]  If 7pm- 7am, please page neurology on call at [phone].

## 2012-07-31 NOTE — Procedures (Signed)
EEG report.  Brief clinical history:57 y.o. 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    Technique: this is a 17 channel routine scalp EEG performed at the bedside with bipolar and monopolar montages arranged in accordance to the international 10/20 system of electrode placement. One channel was dedicated to EKG recording.  No activating procedures performed.  Description: There is continuous generalized monomorphic and unreactive slowing predominantly in the delta range without intermixed epileptiform discharges. No evidence of electrographic seizures noted..  EKG showed sinus rhythm.  Impression: this is an abnormal EEG with findings consistent with a severe encephalopathy, non specific as to cause. No electrographic seizures noted. Clinical correlation is advised.  Wyatt Portela, MD

## 2012-08-10 DEATH — deceased

## 2012-09-06 ENCOUNTER — Other Ambulatory Visit: Payer: Self-pay | Admitting: Geriatric Medicine

## 2012-09-07 ENCOUNTER — Other Ambulatory Visit (HOSPITAL_COMMUNITY): Payer: Self-pay | Admitting: Geriatric Medicine

## 2012-09-07 DIAGNOSIS — R1310 Dysphagia, unspecified: Secondary | ICD-10-CM

## 2012-09-11 ENCOUNTER — Ambulatory Visit (HOSPITAL_COMMUNITY)
Admission: RE | Admit: 2012-09-11 | Discharge: 2012-09-11 | Disposition: A | Discharge status: Home / Self Care | Payer: No Typology Code available for payment source | Source: Ambulatory Visit | Attending: Geriatric Medicine | Admitting: Geriatric Medicine

## 2012-09-11 DIAGNOSIS — R1312 Dysphagia, oropharyngeal phase: Secondary | ICD-10-CM | POA: Diagnosis not present

## 2012-09-11 DIAGNOSIS — I10 Essential (primary) hypertension: Secondary | ICD-10-CM | POA: Insufficient documentation

## 2012-09-11 DIAGNOSIS — R1310 Dysphagia, unspecified: Secondary | ICD-10-CM

## 2012-09-11 DIAGNOSIS — R1311 Dysphagia, oral phase: Secondary | ICD-10-CM | POA: Diagnosis present

## 2012-09-11 NOTE — Procedures (Signed)
Objective Swallowing Evaluation: Modified Barium Swallowing Study  Patient Details  Name: [name] MRN: [id number] Date of Birth: 06-May-1955  Today's Date: 09/11/2012 Time: 7846-9629 SLP Time Calculation (min): 29 min  Past Medical History:  Past Medical History  Diagnosis Date  . Stroke 2007  . Hypertension   . Difficult airway for intubation 06/22/2012    Deep and anterior. Difficult with standard laryngoscope. Easily intubated with Glidescope   . Coronary artery disease     Stent placement   Past Surgical History:  Past Surgical History  Procedure Laterality Date  . Coronary stent placement    . Cardiac surgery     HPI:  Pt presents from SNF where he has reportedly been recieving rehabilitation since his CVA in June 2014. Family shared that he has been NPO since the time of his CVA and that he has not had rpevious swallowing evaluations (Oneill previous MBS in chart).      Assessment / Plan / Recommendation Clinical Impression  Dysphagia Diagnosis: Mild oral phase dysphagia;Moderate oral phase dysphagia;Mild pharyngeal phase dysphagia;Moderate pharyngeal phase dysphagia Clinical impression: Pt presents with a mild-moderate oropharyngeal dysphagia with sensorimotor deficits. Oral phase is marked by mild weakness that results in oral residue after the swallow, which is reflexively cleared with a delayed second swallow. Pharyngeal swallow response is initiated with a moderate delay at the valleculae with soft solids and at the lateral channels and pyriform sinuses with pureed solids and all liquid consistencies tested. Delayed initiation resulted in intermittent silent aspiration of thin liquids before the swallow, particularly when pt consumed large sips. Given current cognitive-linguistic function, pt is not appropriate for compensatory strategies at this time. Therefore, recommend to Dys. 3 textures and nectar-thick liquids.    Treatment Recommendation       Diet Recommendation  Dysphagia 3 (Mechanical Soft);Nectar-thick liquid   Liquid Administration via: Cup;Straw;Oneill straw Medication Administration: Whole meds with puree Supervision: Patient able to self feed;Full supervision/cueing for compensatory strategies Compensations: Slow rate;Small sips/bites Postural Changes and/or Swallow Maneuvers: Seated upright 90 degrees    Other  Recommendations Oral Care Recommendations: Oral care BID Other Recommendations: Order thickener from pharmacy;Prohibited food (jello, ice cream, thin soups);Remove water pitcher   Follow Up Recommendations  Skilled Nursing facility    Frequency and Duration        Pertinent Vitals/Pain N/A    SLP Swallow Goals  N/A   General Date of Onset:  (June 2014) HPI: Pt presents from SNF where he has reportedly been recieving rehabilitation since his CVA in June 2014. Family shared that he has been NPO since the time of his CVA and that he has not had rpevious swallowing evaluations (Oneill previous MBS in chart).  Type of Study: Modified Barium Swallowing Study Reason for Referral: Objectively evaluate swallowing function Previous Swallow Assessment: none per family report Diet Prior to this Study: NPO Temperature Spikes Noted: N/A Respiratory Status: Room air Behavior/Cognition: Alert;Requires cueing;Other (comment) (intermittently follows one-step commands) Oral Cavity - Dentition: Adequate natural dentition Oral Motor / Sensory Function:  (unable to adequately assess) Self-Feeding Abilities: Able to feed self;Needs assist Patient Positioning: Upright in chair Baseline Vocal Quality: Low vocal intensity;Other (comment) (minimal vocalizations) Volitional Cough: Weak;Other (Comment) (cognitively unable to elicit consistently) Volitional Swallow: Unable to elicit Anatomy: Within functional limits Pharyngeal Secretions: Not observed secondary MBS    Reason for Referral Objectively evaluate swallowing function   Oral Phase Oral  Preparation/Oral Phase Oral Phase: Impaired Oral - Nectar Oral - Nectar Teaspoon: Weak lingual manipulation;Incomplete  tongue to palate contact;Reduced posterior propulsion;Lingual/palatal residue Oral - Nectar Cup: Weak lingual manipulation;Incomplete tongue to palate contact;Reduced posterior propulsion;Lingual/palatal residue;Right anterior bolus loss Oral - Nectar Straw: Weak lingual manipulation;Incomplete tongue to palate contact;Reduced posterior propulsion;Lingual/palatal residue Oral - Thin Oral - Thin Teaspoon: Weak lingual manipulation;Incomplete tongue to palate contact;Reduced posterior propulsion;Lingual/palatal residue Oral - Thin Cup: Weak lingual manipulation;Incomplete tongue to palate contact;Reduced posterior propulsion;Lingual/palatal residue Oral - Thin Straw: Weak lingual manipulation;Incomplete tongue to palate contact;Reduced posterior propulsion;Lingual/palatal residue Oral - Solids Oral - Puree: Weak lingual manipulation;Incomplete tongue to palate contact;Reduced posterior propulsion;Lingual/palatal residue Oral - Mechanical Soft: Weak lingual manipulation;Incomplete tongue to palate contact;Reduced posterior propulsion;Lingual/palatal residue Oral - Pill: Weak lingual manipulation;Incomplete tongue to palate contact;Reduced posterior propulsion;Lingual/palatal residue;Other (Comment) (pt chewed pill; wife reports this is baseline behavior)   Pharyngeal Phase Pharyngeal Phase Pharyngeal Phase: Impaired Pharyngeal - Nectar Pharyngeal - Nectar Teaspoon: Other (Comment);Delayed swallow initiation (oral residue spills posteriorly after the swallow) Pharyngeal - Nectar Cup: Delayed swallow initiation;Penetration/Aspiration during swallow;Other (Comment) (oral residue spills posteriorly after the swallow) Penetration/Aspiration details (nectar cup): Material enters airway, remains ABOVE vocal cords then ejected out Pharyngeal - Nectar Straw: Delayed swallow initiation;Other  (Comment) (oral residue spills posteriorly after the swallow) Pharyngeal - Thin Pharyngeal - Thin Teaspoon: Delayed swallow initiation;Other (Comment) (oral residue spills posteriorly after the swallow) Pharyngeal - Thin Cup: Delayed swallow initiation;Other (Comment) (oral residue spills posteriorly after the swallow) Pharyngeal - Thin Straw: Delayed swallow initiation;Penetration/Aspiration before swallow;Other (Comment) (oral residue spills posteriorly after the swallow) Penetration/Aspiration details (thin straw): Material enters airway, passes BELOW cords without attempt by patient to eject out (silent aspiration) Pharyngeal - Solids Pharyngeal - Puree: Delayed swallow initiation Pharyngeal - Mechanical Soft: Delayed swallow initiation Pharyngeal - Pill: Other (Comment) (chewed pill before swallowing)  Cervical Esophageal Phase    GO    Cervical Esophageal Phase Cervical Esophageal Phase: [facility]         Maxcine Ham 09/11/2012, 1:30 PM  Maxcine Ham, M.A. CCC-SLP

## 2012-09-11 NOTE — Progress Notes (Signed)
09/11/12 1100  SLP G-Codes **NOT FOR INPATIENT CLASS**  Functional Assessment Tool Used clinical judgment  Functional Limitations Swallowing  Swallow Current Status (J4782) CJ  Swallow Goal Status (N5621) CJ  Swallow Discharge Status (H0865) CJ  SLP Evaluations  $ SLP Speech Visit 1 Procedure  SLP Evaluations  $MBS Swallow 1 Procedure  $Swallowing Treatment 1 Procedure

## 2012-09-12 ENCOUNTER — Ambulatory Visit
Admission: RE | Admit: 2012-09-12 | Discharge: 2012-09-12 | Disposition: A | Discharge status: Home / Self Care | Payer: Medicare Other | Source: Ambulatory Visit | Attending: Geriatric Medicine | Admitting: Geriatric Medicine

## 2012-09-18 ENCOUNTER — Ambulatory Visit
Admission: RE | Admit: 2012-09-18 | Discharge: 2012-09-18 | Disposition: A | Discharge status: Home / Self Care | Payer: No Typology Code available for payment source | Source: Ambulatory Visit | Attending: Geriatric Medicine | Admitting: Geriatric Medicine

## 2012-09-18 ENCOUNTER — Other Ambulatory Visit: Payer: Self-pay | Admitting: Geriatric Medicine

## 2012-09-18 ENCOUNTER — Ambulatory Visit
Admission: RE | Admit: 2012-09-18 | Discharge: 2012-09-18 | Disposition: A | Discharge status: Home / Self Care | Payer: Medicare Other | Source: Ambulatory Visit | Attending: Geriatric Medicine | Admitting: Geriatric Medicine

## 2012-09-18 MED ORDER — GADOBENATE DIMEGLUMINE 529 MG/ML IV SOLN
13.0000 mL | Freq: Once | INTRAVENOUS | Status: AC | PRN
  Administered 2012-09-18: 13 mL via INTRAVENOUS

## 2012-09-24 ENCOUNTER — Observation Stay (HOSPITAL_COMMUNITY): Payer: Medicare Other

## 2012-09-24 ENCOUNTER — Emergency Department (HOSPITAL_COMMUNITY): Payer: Medicare Other

## 2012-09-24 ENCOUNTER — Observation Stay (HOSPITAL_COMMUNITY)
Admission: EM | Admit: 2012-09-24 | Discharge: 2012-09-25 | Disposition: A | Discharge status: Skilled Nursing Facility | Payer: Medicare Other | Attending: Internal Medicine | Admitting: Internal Medicine

## 2012-09-24 ENCOUNTER — Encounter (HOSPITAL_COMMUNITY): Payer: Self-pay | Admitting: Emergency Medicine

## 2012-09-24 ENCOUNTER — Other Ambulatory Visit: Payer: Self-pay

## 2012-09-24 DIAGNOSIS — I6350 Cerebral infarction due to unspecified occlusion or stenosis of unspecified cerebral artery: Principal | ICD-10-CM | POA: Insufficient documentation

## 2012-09-24 DIAGNOSIS — R4701 Aphasia: Secondary | ICD-10-CM | POA: Diagnosis present

## 2012-09-24 DIAGNOSIS — N189 Chronic kidney disease, unspecified: Secondary | ICD-10-CM | POA: Insufficient documentation

## 2012-09-24 DIAGNOSIS — R471 Dysarthria and anarthria: Secondary | ICD-10-CM

## 2012-09-24 DIAGNOSIS — D509 Iron deficiency anemia, unspecified: Secondary | ICD-10-CM | POA: Diagnosis not present

## 2012-09-24 DIAGNOSIS — I10 Essential (primary) hypertension: Secondary | ICD-10-CM | POA: Diagnosis present

## 2012-09-24 DIAGNOSIS — Z8673 Personal history of transient ischemic attack (TIA), and cerebral infarction without residual deficits: Secondary | ICD-10-CM | POA: Diagnosis not present

## 2012-09-24 DIAGNOSIS — Z931 Gastrostomy status: Secondary | ICD-10-CM | POA: Insufficient documentation

## 2012-09-24 DIAGNOSIS — Z9861 Coronary angioplasty status: Secondary | ICD-10-CM | POA: Diagnosis not present

## 2012-09-24 DIAGNOSIS — E119 Type 2 diabetes mellitus without complications: Secondary | ICD-10-CM | POA: Diagnosis not present

## 2012-09-24 DIAGNOSIS — I129 Hypertensive chronic kidney disease with stage 1 through stage 4 chronic kidney disease, or unspecified chronic kidney disease: Secondary | ICD-10-CM | POA: Insufficient documentation

## 2012-09-24 DIAGNOSIS — I2510 Atherosclerotic heart disease of native coronary artery without angina pectoris: Secondary | ICD-10-CM | POA: Insufficient documentation

## 2012-09-24 DIAGNOSIS — Z79899 Other long term (current) drug therapy: Secondary | ICD-10-CM | POA: Diagnosis not present

## 2012-09-24 DIAGNOSIS — R4702 Dysphasia: Secondary | ICD-10-CM | POA: Diagnosis present

## 2012-09-24 DIAGNOSIS — I6990 Unspecified sequelae of unspecified cerebrovascular disease: Secondary | ICD-10-CM | POA: Diagnosis present

## 2012-09-24 DIAGNOSIS — I639 Cerebral infarction, unspecified: Secondary | ICD-10-CM

## 2012-09-24 LAB — DIFFERENTIAL
Eosinophils Absolute: 0.3 10*3/uL (ref 0.0–0.7)
Eosinophils Relative: 3 % (ref 0–5)
Lymphs Abs: 1.8 10*3/uL (ref 0.7–4.0)
Monocytes Absolute: 0.7 10*3/uL (ref 0.1–1.0)
Monocytes Relative: 9 % (ref 3–12)

## 2012-09-24 LAB — CBC
HCT: 39.2 % (ref 39.0–52.0)
MCH: 23.9 pg — ABNORMAL LOW (ref 26.0–34.0)
MCV: 72.6 fL — ABNORMAL LOW (ref 78.0–100.0)
Platelets: 228 10*3/uL (ref 150–400)
RBC: 5.4 MIL/uL (ref 4.22–5.81)

## 2012-09-24 LAB — PROTIME-INR: Prothrombin Time: 11.4 seconds — ABNORMAL LOW (ref 11.6–15.2)

## 2012-09-24 LAB — COMPREHENSIVE METABOLIC PANEL
ALT: 28 U/L (ref 0–53)
AST: 26 U/L (ref 0–37)
Albumin: 3.4 g/dL — ABNORMAL LOW (ref 3.5–5.2)
Alkaline Phosphatase: 92 U/L (ref 39–117)
Sodium: 134 mEq/L — ABNORMAL LOW (ref 135–145)
Total Bilirubin: 0.3 mg/dL (ref 0.3–1.2)

## 2012-09-24 LAB — URINALYSIS, ROUTINE W REFLEX MICROSCOPIC
Bilirubin Urine: NEGATIVE
Hgb urine dipstick: NEGATIVE
Ketones, ur: NEGATIVE mg/dL
Nitrite: NEGATIVE
Specific Gravity, Urine: 1.017 (ref 1.005–1.030)
pH: 8 (ref 5.0–8.0)

## 2012-09-24 LAB — URINE MICROSCOPIC-ADD ON

## 2012-09-24 LAB — GLUCOSE, CAPILLARY

## 2012-09-24 MED ORDER — ONDANSETRON HCL 4 MG PO TABS: 4.0000 mg | ORAL_TABLET | Freq: Four times a day (QID) | ORAL | Status: DC | PRN

## 2012-09-24 MED ORDER — OXYCODONE HCL 5 MG PO TABS: 5.0000 mg | ORAL_TABLET | ORAL | Status: DC | PRN

## 2012-09-24 MED ORDER — SODIUM CHLORIDE 0.9 % IV SOLN: INTRAVENOUS | Status: DC

## 2012-09-24 MED ORDER — SODIUM CHLORIDE 0.9 % IV SOLN
500.0000 mg | Freq: Two times a day (BID) | INTRAVENOUS | Status: DC
  Filled 2012-09-24 (×2): qty 5

## 2012-09-24 MED ORDER — HALOPERIDOL 2 MG PO TABS
2.0000 mg | ORAL_TABLET | Freq: Two times a day (BID) | ORAL | Status: DC
  Administered 2012-09-24 – 2012-09-25 (×2): 2 mg
  Filled 2012-09-24 (×3): qty 1

## 2012-09-24 MED ORDER — HYDRALAZINE HCL 50 MG PO TABS
50.0000 mg | ORAL_TABLET | Freq: Four times a day (QID) | ORAL | Status: DC
  Administered 2012-09-24 – 2012-09-25 (×2): 50 mg
  Filled 2012-09-24 (×7): qty 1

## 2012-09-24 MED ORDER — CLONIDINE HCL 0.3 MG/24HR TD PTWK
0.3000 mg | MEDICATED_PATCH | TRANSDERMAL | Status: DC
  Administered 2012-09-24: 0.3 mg via TRANSDERMAL
  Filled 2012-09-24 (×2): qty 1

## 2012-09-24 MED ORDER — ONDANSETRON HCL 4 MG/2ML IJ SOLN: 4.0000 mg | Freq: Four times a day (QID) | INTRAMUSCULAR | Status: DC | PRN

## 2012-09-24 MED ORDER — LEVETIRACETAM 100 MG/ML PO SOLN
500.0000 mg | Freq: Two times a day (BID) | ORAL | Status: DC
  Administered 2012-09-24 – 2012-09-25 (×2): 500 mg
  Filled 2012-09-24 (×3): qty 5

## 2012-09-24 MED ORDER — INSULIN ASPART 100 UNIT/ML ~~LOC~~ SOLN: 0.0000 [IU] | SUBCUTANEOUS | Status: DC

## 2012-09-24 MED ORDER — SODIUM CHLORIDE 0.9 % IJ SOLN: 3.0000 mL | Freq: Two times a day (BID) | INTRAMUSCULAR | Status: DC

## 2012-09-24 MED ORDER — SERTRALINE HCL 50 MG PO TABS
50.0000 mg | ORAL_TABLET | Freq: Every day | ORAL | Status: DC
  Administered 2012-09-24 – 2012-09-25 (×2): 50 mg
  Filled 2012-09-24 (×2): qty 1

## 2012-09-24 NOTE — ED Notes (Signed)
To ED via [facility] from [facility]-- with c/o increased in garbled speech-- hx of CVA, last in June. 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

## 2012-09-24 NOTE — ED Provider Notes (Signed)
CSN: [id number]     Arrival date & time 09/24/12  0815 History   First MD Initiated Contact with Patient 09/24/12 0820     Chief Complaint  Patient presents with  . Aphasia   HPI Comments: Pt is a 57 y.o. 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  Pt baseline includes expressive aphasia with the ability to answer simple questions and simple commands and dysarthria.  He is dependent for his ADLs from nursing aid or his wife who helps with a lot of his daily activities.  Pt's wife went to see him after leaving work this AM, and around 6:30-6:45 noticed that his speech was not the same and he was unable to answer questions.  At this point, she had him transported to the ED.  Level 5 Caveat applies to specific Sx as pt is unable to communicate response.      Past Medical History  Diagnosis Date  . Stroke 2007  . Hypertension   . Difficult airway for intubation 06/22/2012    Deep and anterior. Difficult with standard laryngoscope. Easily intubated with Glidescope   . Coronary artery disease     Stent placement   Past Surgical History  Procedure Laterality Date  . Coronary stent placement    . Cardiac surgery     No family history on file. History  Substance Use Topics  . Smoking status: Never Smoker   . Smokeless tobacco: Never Used  . Alcohol Use: No    Review of Systems  Constitutional: Positive for activity change.  Neurological: Positive for speech difficulty.    Allergies  Review of patient's allergies indicates no known allergies.  Home Medications   Current Outpatient Rx  Name  Route  Sig  Dispense  Refill  . acetaminophen (TYLENOL) 325 MG tablet   Per Tube   Place 2 tablets (650 mg total) into  feeding tube every 4 (four) hours as needed.         Marland Kitchen albuterol (PROVENTIL) (5 MG/ML) 0.5% nebulizer solution   Nebulization   Take 0.5 mLs (2.5 mg total) by nebulization every 2 (two) hours as needed for wheezing or shortness of breath.   20 mL   12   . antiseptic oral rinse (BIOTENE) LIQD   Mouth Rinse   15 mLs by Mouth Rinse route QID.         Marland Kitchen artificial tears (LACRILUBE) OINT ophthalmic ointment   Ophthalmic   Apply to eye every 4 (four) hours as needed.      0   . chlorhexidine (PERIDEX) 0.12 % solution   Mouth/Throat   Use as directed 15 mLs in the mouth or throat 2 (two) times daily.   120 mL   0   . cloNIDine (CATAPRES - DOSED IN MG/24 HR) 0.3 mg/24hr   Transdermal   Place 1 patch (0.3 mg total) onto the skin once a week.   4 patch   12   . feeding supplement (PRO-STAT SUGAR FREE 64) LIQD   Per Tube   Place 30 mLs into feeding tube 2 (two) times daily at 10 AM and 5 PM.   900 mL   0   . fentaNYL (SUBLIMAZE) 0.05 MG/ML injection   Intravenous  Inject 1-2 mLs (50-100 mcg total) into the vein every 2 (two) hours as needed for severe pain.   2 mL   0   . hydrALAZINE (APRESOLINE) 20 MG/ML injection   Intravenous   Inject 0.5 mLs (10 mg total) into the vein every 8 (eight) hours.   1 mL      . insulin aspart (NOVOLOG) 100 UNIT/ML injection   Subcutaneous   Inject 0-9 Units into the skin every 4 (four) hours.   1 vial   12   . insulin glargine (LANTUS) 100 UNIT/ML injection   Subcutaneous   Inject 0.3 mLs (30 Units total) into the skin daily.   10 mL   12   . loperamide (IMODIUM) 2 MG capsule   Oral   Take 1 capsule (2 mg total) by mouth as needed for diarrhea or loose stools.   30 capsule   0   . LORazepam (ATIVAN) 2 MG/ML injection   Intravenous   Inject 0.5 mLs (1 mg total) into the vein every 30 (thirty) minutes as needed for seizure.   1 mL   0   . metoprolol (LOPRESSOR) 1 MG/ML injection   Intravenous   Inject 5 mLs (5 mg total)  into the vein every 6 (six) hours.   5 mL      . metoprolol (LOPRESSOR) 1 MG/ML injection   Intravenous   Inject 2.5-5 mLs (2.5-5 mg total) into the vein every 3 (three) hours as needed (to maintain HR < 115/min).   5 mL      . Nutritional Supplements (FEEDING SUPPLEMENT, GLUCERNA 1.2 CAL,) LIQD   Per Tube   Place 1,000 mLs into feeding tube continuous.         . sodium chloride 0.9 % SOLN 100 mL with phenytoin 50 MG/ML SOLN 175 mg   Intravenous   Inject 175 mg into the vein every 12 (twelve) hours.         . Vancomycin (VANCOCIN) 750 MG/150ML SOLN   Intravenous   Inject 150 mLs (750 mg total) into the vein daily.         . Water For Irrigation, Sterile (FREE WATER) SOLN   Per Tube   Place 400 mLs into feeding tube every 4 (four) hours.          BP 141/77  Pulse 65  Temp(Src) 98.5 F (36.9 C) (Oral)  Resp 16  SpO2 99% Physical Exam  Constitutional: Vital signs are normal. He appears well-developed and well-nourished. He does not have a sickly appearance. He does not appear ill. No distress.  HENT:  Head: Normocephalic and atraumatic.  Mouth/Throat: Mucous membranes are not dry.  Eyes: Conjunctivae and EOM are normal. Pupils are equal, round, and reactive to light.  Neck: No JVD present.  Cardiovascular: Normal rate and regular rhythm.   No murmur heard. Pulmonary/Chest: Effort normal and breath sounds normal.  Abdominal:  G tube in place, no erythema/edema noted   Neurological: He is alert. He has normal reflexes. He displays atrophy (Lower extremities B/L ). A cranial nerve deficit (CN 12 deficit) is present. No sensory deficit. He exhibits abnormal muscle tone (Lower Extremities B/L ). GCS eye subscore is 4. GCS verbal subscore is 2. GCS motor subscore is 6.  4/5 Grip Strength on R, 5/5 Grip Strength L, + Clonus B/L LE     ED Course  Procedures (including critical care time)  Date: 09/24/2012  Rate: 68  Rhythm: normal sinus rhythm  QRS Axis: normal  Intervals: normal  ST/T Wave abnormalities: normal  Conduction Disutrbances: none  Narrative Interpretation: LVH, ST elevation in anterior leads which compared to last EKG on June 23, 2012 looks similar.   Old EKG Reviewed: No significant changes noted    Labs Review Labs Reviewed  PROTIME-INR - Abnormal; Notable for the following:    Prothrombin Time 11.4 (*)    All other components within normal limits  CBC - Abnormal; Notable for the following:    Hemoglobin 12.9 (*)    MCV 72.6 (*)    MCH 23.9 (*)    RDW 19.2 (*)    All other components within normal limits  COMPREHENSIVE METABOLIC PANEL - Abnormal; Notable for the following:    Sodium 134 (*)    Glucose, Bld 106 (*)    BUN 28 (*)    Albumin 3.4 (*)    All other components within normal limits  APTT  DIFFERENTIAL  TROPONIN I  URINALYSIS, ROUTINE W REFLEX MICROSCOPIC  POCT I-STAT TROPONIN I   Imaging Review Dg Chest 2 View  09/24/2012   CLINICAL DATA:  Weakness, aphasia  EXAM: CHEST  2 VIEW  COMPARISON:  Chest radiograph 07/11/2012, 07/10/2012  FINDINGS: Stable enlarged cardiac silhouette. There is a prominent vascular shadow in the right paramediastinal upper lobe unchanged from prior. There are low lung volumes. No focal consolidation. No pneumothorax. No pleural fluid. Percutaneous gastrostomy tube noted.  IMPRESSION: Cardiomegaly without acute cardiopulmonary findings. Low lung volumes.   Electronically Signed   By: Genevive Bi M.D.   On: 09/24/2012 10:17   Ct Head Wo Contrast  09/24/2012   *RADIOLOGY REPORT*  Clinical Data: Increased speech difficulty.  Facial droop.  Prior stroke 2009.  Hypertension.  CT HEAD WITHOUT CONTRAST  Technique:  Contiguous axial images were obtained from the base of the skull through the vertex without contrast.  Comparison: 05/17/2007 MR.  Several CTs between 06/21/2012 and 07/03/2012.  Findings: Remote infarct posterior right caudate/lenticular nucleus (2009).  The patient sustained  diffuse hemorrhage in the left parietal - occipital lobe which may be related to TPA combined with prior infarct.  The mass effect and hemorrhage has improved since the 07/03/2012 examination with resolution of midline shift to the right.  Currently, mild hyperdensity along the periphery of the left parietal - occipital lobe insult (series 2 images 18 through 20) may represent laminar necrosis although a small amount of acute hemorrhage cannot be entirely excluded.  Remote infarcts frontal lobes greater on the left.  Prominent small vessel disease type changes.  No large acute infarct separate from the above described findings. If further delineation is clinically desired MR imaging may prove helpful.  Baseline atrophy without hydrocephalus.  No intracranial mass lesions seen separate from the above described findings.  Lack of contrast limits evaluation for detection of small lesion.  Vascular calcifications.  IMPRESSION: Remote infarct posterior right caudate/lenticular nucleus (2009).  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  The mass effect and hemorrhage has improved since the 07/03/2012 examination with resolution of midline shift to the right.  Currently, mild hyperdensity along the periphery of the left parietal - occipital lobe insult (series 2 images 18 through 20) may represent laminar necrosis although a small amount of acute hemorrhage cannot be entirely excluded.  Remote infarcts frontal lobes greater on the left.  Prominent small vessel disease type changes.  No large acute infarct separate from the  above described findings. If further delineation is clinically desired MR imaging may prove helpful.  Baseline atrophy   Original Report Authenticated By: Lacy Duverney, M.D.    MDM   1. Dysarthria   2. Expressive aphasia   Pt w/ Hx of two previous CVAs, ischemic in June 2014 with conversion to  hemorrhagic, which prevents him from being a tPA candidate.  Concern for new infarction, will get stroke w/u and evaluate for possible other etiologies of AMS as pt is noncommunicating.  EKG looks basically unchanged with possible increase in STE in anterior, will get POC troponin.  Vitals stable, afebrile, no G-tube erythema/edema.   Pending w/u, will consult neurology for further evaluation.    11:31 AM - Dr. Bebe Shaggy spoke with Dr. Excell Seltzer, cardiologist on call, about pt's EKG and does not believe this is a picture of ACS.  As well, pt's CT scan shows possible left parietal-occipital lobe insult.  Spoke with Dr. Leroy Kennedy, neurologist on call, who will evaluate the CT scan as well as evaluate the patient.   12:04 PM- Dr. Leroy Kennedy saw pt and recommends MRI and admission w/ follow up by stroke team.  1:30 PM - Spoke with Internal Medicine Teaching Service who will evaluate and admit the patient.    Twana First Paulina Fusi, DO of [facility] 09/24/2012, 1:31 PM      Briscoe Deutscher, DO 09/24/12 1331

## 2012-09-24 NOTE — ED Notes (Signed)
Internal Medicine resident notified of pt's refusing IV. Will send pt to MRI prior to going to [facility].

## 2012-09-24 NOTE — ED Provider Notes (Signed)
11:03 AM I spoke to dr cooper with cardiology He reviewed EKGs, and given history, unlikely STEMI Continue neuro workup   Joya Gaskins, MD 09/24/12 1103

## 2012-09-24 NOTE — H&P (Signed)
Date: 09/24/2012               Patient Name:  [name] MRN: [id number]  DOB: [birth year] Age / Sex: 57 y.o., male   PCP: Erlinda Hong, MD         Medical Service: Internal Medicine Teaching Service         Attending Physician: Dr. Burns Spain, MD    First Contact: Dr. Mariea Clonts Pager: [phone]  Second Contact: Dr. 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   Presented with aphasia noticed this mornig when his wife came to visit him at the re-hab facitlity where he stays at about 6.30am this morning.  But wife says prior to today he was able to understand and verbalize in few short phrases. She noticed this morning he was not able to talk at all, but later was able to talk but speech was not understandable.  Wife also says she noticed rt facial droop this morning, around his jaw, which she says later resolved. He had signif residual deficit from his previous stroke in June, he has not been mobile, but has been able to move his right arm. He requires assistance with ADL. He has been getting physical therapy and speech therapy at the rehab facilitity. Family want him to go back to re-had facility.   Patient was worked up in 2009 for possible causes of his stroke- Factor 5 leiden- Negative, lupus anticoag- Normal, ESR-1, Pro-time- 11.8, INR- 0.9, prothrombin II gene mutation- Neg, ,prot S- WNl, low antithrombin 3 function, Homocysteine elev at 19.8, protein c atv- 177 elevated( 75-133).   He was brought to the ED by Ambulance, i-stat troponin ws negative, CMP- Na- 134, CBC within normal limits,APTt- wnl, PT- mildly subnormal--11.4, INR- 0.84, Head  CT-Remote post infarct- 2009, Improvement in mass effect from prior stroke, with resolution of midline shift.   Meds: Current Facility-Administered Medications  Medication Dose Route Frequency Provider Last Rate Last Dose  . 0.9 %  sodium chloride infusion   Intravenous Continuous Genelle Gather, MD      . cloNIDine (CATAPRES - Dosed in mg/24 hr) patch 0.3 mg  0.3 mg Transdermal Weekly Genelle Gather, MD      . haloperidol (HALDOL) tablet 2 mg  2 mg Per Tube BID Genelle Gather, MD      . hydrALAZINE (APRESOLINE) tablet 50 mg  50 mg Per Tube Q6H Genelle Gather, MD      . insulin aspart (novoLOG) injection 0-15 Units  0-15 Units Subcutaneous Q4H Genelle Gather, MD      . levETIRAcetam (KEPPRA) 100 MG/ML solution 500 mg  500 mg Per Tube BID Genelle Gather, MD      . ondansetron [facility]) tablet 4 mg  4 mg Oral Q6H PRN Genelle Gather, MD       Or  . ondansetron [facility]) injection 4 mg  4 mg Intravenous Q6H PRN Genelle Gather, MD      . oxyCODONE (Oxy IR/ROXICODONE) immediate release tablet 5 mg  5 mg Per Tube Q4H PRN Genelle Gather, MD      .  sertraline (ZOLOFT) tablet 50 mg  50 mg Per Tube Daily Genelle Gather, MD      . sodium chloride 0.9 % injection 3 mL  3 mL Intravenous Q12H Genelle Gather, MD        Allergies: Allergies as of 09/24/2012  . (No Known Allergies)   Past Medical History  Diagnosis Date  . Stroke 2007  . Hypertension   . Difficult airway for intubation 06/22/2012    Deep and anterior. Difficult with standard laryngoscope. Easily intubated with Glidescope   . Coronary artery disease     Stent placement   Past Surgical History  Procedure Laterality Date  . Coronary stent placement    . Cardiac surgery     Family History  Problem Relation Age of Onset  . Hypertension Mother   . Hypertension Father    History   Social History  . Marital Status: Married    Spouse Name: N/A    Number of Children: N/A  . Years of Education: N/A   Occupational  History  . Not on file.   Social History Main Topics  . Smoking status: Never Smoker   . Smokeless tobacco: Never Used  . Alcohol Use: No  . Drug Use: Not on file  . Sexual Activity: Not on file   Other Topics Concern  . Not on file   Social History Narrative  . No narrative on file    Review of Systems: . Patient aphasic,  Physical Exam: Blood pressure 190/81, pulse 65, temperature 98.5 F (36.9 C), temperature source Oral, resp. rate 16, height 5\' 9"  (1.753 m), weight 151 lb (68.493 kg), SpO2 99.00%. . GENERAL- alert, very emotional, crying ,trying to communicate, but speech is gabbled, appears as stated age,  HEENT- Atraumatic, normocephalic, PERRL, EOMI, oral mucosa appears moist, No carotid bruit, no cervical LN enlargement, thyroid does not appear enlarged. CARDIAC- RRR, no murmurs, rubs or gallops. RESP- Moving equal volumes of air, and clear to auscultation bilaterally. ABDOMEN- Soft, nontender, PEG tube present,  no palpable masses or organomegaly, bowel sounds present. NEURO- No obvious facial droop, Cr N - 2-12 intact, Strenght reduced in both upper extremities- could not be assessed, as patient became emotional. Lower extr- strenght- 4/5 in Left, no movemnt on RT. EXTREMITIES- pulse reduced, on lower extr. SKIN- Warm, dry, No rash or lesion. PSYCH- Emotional, affect- sad.  Lab results: Basic Metabolic Panel:  Recent Labs  16/10/96 0930  NA 134*  K 4.3  CL 97  CO2 25  GLUCOSE 106*  BUN 28*  CREATININE 0.83  CALCIUM 9.6   Liver Function Tests:  Recent Labs  09/24/12 0930  AST 26  ALT 28  ALKPHOS 92  BILITOT 0.3  PROT 7.8  ALBUMIN 3.4*   CBC:  Recent Labs  09/24/12 0930  WBC 8.5  NEUTROABS 5.7  HGB 12.9*  HCT 39.2  MCV 72.6*  PLT 228   Cardiac Enzymes:  Recent Labs  09/24/12 0930  TROPONINI <0.30   Coagulation:  Recent Labs  09/24/12 0930  LABPROT 11.4*  INR 0.84    Urinalysis:  Recent Labs  09/24/12 1301    COLORURINE YELLOW  LABSPEC 1.017  PHURINE 8.0  GLUCOSEU NEGATIVE  HGBUR NEGATIVE  BILIRUBINUR NEGATIVE  KETONESUR NEGATIVE  PROTEINUR 100*  UROBILINOGEN 1.0  NITRITE NEGATIVE  LEUKOCYTESUR TRACE*   Imaging results:  Dg Chest 2 View  09/24/2012   CLINICAL DATA:  Weakness, aphasia  EXAM: CHEST  2 VIEW  COMPARISON:  Chest radiograph 07/11/2012, 07/10/2012  FINDINGS: Stable enlarged cardiac silhouette. There is a prominent vascular shadow in the right paramediastinal upper lobe unchanged from prior. There are low lung volumes. No focal consolidation. No pneumothorax. No pleural fluid. Percutaneous gastrostomy tube noted.  IMPRESSION: Cardiomegaly without acute cardiopulmonary findings. Low lung volumes.   Electronically Signed   By: Genevive Bi M.D.   On: 09/24/2012 10:17   Ct Head Wo Contrast  09/24/2012   *RADIOLOGY REPORT*  Clinical Data: Increased speech difficulty.  Facial droop.  Prior stroke 2009.  Hypertension.  CT HEAD WITHOUT CONTRAST    IMPRESSION: Remote infarct posterior right caudate/lenticular nucleus (2009).  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  The mass effect and hemorrhage has improved since the 07/03/2012 examination with resolution of midline shift to the right.  Currently, mild hyperdensity along the periphery of the left parietal - occipital lobe insult (series 2 images 18 through 20) may represent laminar necrosis although a small amount of acute hemorrhage cannot be entirely excluded.  Remote infarcts frontal lobes greater on the left.  Prominent small vessel disease type changes.  No large acute infarct separate from the above described findings. If further delineation is clinically desired MR imaging may prove helpful.  Baseline atrophy   Original Report Authenticated By: Lacy Duverney, M.D.    Other results: EKG: Rate-67bpm, regular, T wave inversion- v4, and v5, St  elevations in v2 and v3. Previous EKG- St elev- v3, v4.   Assessment & Plan by Problem: Principal Problem:   CVA (cerebral infarction) Active Problems:   Dysphasia   Malignant hypertension   Microcytic anemia  CVA ( Cerebral infarction)- Considering new onset of worsening dysartria, and resolved facial droop, a new CVA is most likely. MRI findings are of- Probable small acute infarct in deep white matter on the left. Patient has had extensive work up for possible aetiologies of early onset of stroke in 2009.  - Allow permissive HTN,but not above 180/110, but continue clonidine patch 0.3mg  TD weekly, to prevent rebound HTN. - NPO - HBA1c - Troponin X3- Considering ST and T wave changes, though not specific. - Repeat EKG in the morning. - Levetiracetam- 500mg  BID - Freq neuro checks. - PT/OT consult - Speech Therapist. - Lipid profile. - Admit to Tele -  Ondansetron 4mg  Q6h for nausea. - Neuro Consult and recommendations appreciated. - Consult nutritionist to determine meal per tube.  - Consider statin use, Last LDL- 109, 06/22/2012.  Malignant HTN- Blood pressure has been above normal as reported by family despite multiple medications and compliance. Blood pressure-  208/106. - Will continue Clonidine- 0.3mg  TD weekly. - Hydralazine- 50mg  q6h.  DM2- Well controlled- HBA1c- 6.2, done 06/25/2102. Home meds- Metformin- 1000mg  daily. - Insulin- SSI- Moderate - Lipid Profile.  Microcytic Anemia- HB and HCt currently stable. HB- 12.9. MCV- 72.6.  - Stool for FOBT - iron studies - No records of colonoscopy  DVT PPX- SCDs.  Dispo: Disposition is deferred at this time, awaiting improvement of current medical problems. Anticipated discharge in approximately 2-3 day(s).   The patient does have a current PCP (Erlinda Hong, MD) and does need an [facility] hospital follow-up appointment after discharge.  The patient does not know have transportation limitations that hinder transportation to  clinic appointments.  Signed: Kennis Carina, MD 09/24/2012, 6:21 PM

## 2012-09-24 NOTE — Consult Note (Signed)
Referring Physician: Bebe Shaggy    Chief Complaint: worsening aphasia and possible new stroke.   HPI:                                                                                                                                         [name] is an 57 y.o. 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  Patient was subsequently discharged.  Patient returns to hospital after wife noted worsening aphasia this AM and possible right facial droop. CT head while in the ED showed possible acute infarct in left parietal occipital lobe.    Date last known well: Date: 09/23/2012 Time last known well: Unable to determine tPA Given: No: recent stroke and bleed  Past Medical History  Diagnosis Date  . Stroke 2007  . Hypertension   . Difficult airway for intubation 06/22/2012    Deep and anterior. Difficult with standard laryngoscope. Easily intubated with Glidescope   . Coronary artery disease     Stent placement    Past Surgical History  Procedure Laterality Date  . Coronary stent placement    . Cardiac surgery      Family History  Problem Relation Age of Onset  . Hypertension Mother   . Hypertension Father    Social History:  reports that he has never smoked. He has never used smokeless tobacco. He reports that he does not drink alcohol. His drug history is not on file.  Allergies: No Known Allergies  Medications:                                                                                                                           No current facility-administered medications for this encounter.   Current Outpatient Prescriptions  Medication Sig Dispense Refill  . acetaminophen (TYLENOL) 325 MG tablet Give 650 mg by tube every 6 (six) hours as needed (elevated temperature).      . clonazePAM (KLONOPIN) 0.5 MG tablet Take 0.5 mg by mouth every 12 (twelve)  hours.      Marland Kitchen erythromycin ethylsuccinate (EES) 200 MG/5ML suspension Take 250 mg by mouth every 6 (six) hours.      . feeding supplement (PRO-STAT SUGAR FREE 64) LIQD Take 30 mLs by mouth daily.      Marland Kitchen  haloperidol (HALDOL) 2 MG tablet Take 2 mg by mouth 2 (two) times daily.      . hydrALAZINE (APRESOLINE) 50 MG tablet Take 50 mg by mouth every 6 (six) hours.      . isosorbide dinitrate (ISORDIL) 40 MG tablet Take 40 mg by mouth 3 (three) times daily.      Marland Kitchen levETIRAcetam (KEPPRA) 500 MG tablet Take 500 mg by mouth every 12 (twelve) hours.      Marland Kitchen lisinopril (PRINIVIL,ZESTRIL) 40 MG tablet Take 40 mg by mouth daily.      . metFORMIN (GLUCOPHAGE-XR) 500 MG 24 hr tablet Take 1,000 mg by mouth daily with breakfast.      . metoprolol tartrate (LOPRESSOR) 25 MG tablet Take 25 mg by mouth every 6 (six) hours.      . Nutritional Supplements (FEEDING SUPPLEMENT, JEVITY 1.2 CAL,) LIQD Place 1,000 mLs into feeding tube daily. 90 ml /hr off at 8am start at 2 pm      . oxyCODONE (OXY IR/ROXICODONE) 5 MG immediate release tablet 5 mg by PEG Tube route.      Marland Kitchen oxyCODONE (OXY IR/ROXICODONE) 5 MG immediate release tablet Take 5 mg by mouth every 8 (eight) hours.      . polyethylene glycol (MIRALAX / GLYCOLAX) packet Take 17 g by mouth 2 (two) times daily.      . polyvinyl alcohol (LIQUIFILM TEARS) 1.4 % ophthalmic solution Place 1 drop into both eyes 2 (two) times daily.      . sertraline (ZOLOFT) 50 MG tablet Take 50 mg by mouth daily.      . Water For Irrigation, Sterile (STERILE WATER FOR IRRIGATION) Irrigate with 200 mLs as directed 2 (two) times daily.      . cloNIDine (CATAPRES - DOSED IN MG/24 HR) 0.3 mg/24hr patch Place 1 patch onto the skin once a week.         ROS:                                                                                                                                       History obtained from family  General ROS: negative for - chills, fatigue, fever, night sweats, weight  gain or weight loss Psychological ROS: negative for - behavioral disorder, hallucinations, memory difficulties, mood swings or suicidal ideation Ophthalmic ROS: negative for - blurry vision, double vision, eye pain or loss of vision ENT ROS: negative for - epistaxis, nasal discharge, oral lesions, sore throat, tinnitus or vertigo Allergy and Immunology ROS: negative for - hives or itchy/watery eyes Hematological and Lymphatic ROS: negative for - bleeding problems, bruising or swollen lymph nodes Endocrine ROS: negative for - galactorrhea, hair pattern changes, polydipsia/polyuria or temperature intolerance Respiratory ROS: negative for - cough, hemoptysis, shortness of breath or wheezing Cardiovascular ROS: negative for - chest pain, dyspnea on exertion, edema or irregular heartbeat Gastrointestinal ROS: negative for - abdominal pain, diarrhea,  hematemesis, nausea/vomiting or stool incontinence Genito-Urinary ROS: negative for - dysuria, hematuria, incontinence or urinary frequency/urgency Musculoskeletal ROS: negative for - joint swelling or muscular weakness Neurological ROS: as noted in HPI Dermatological ROS: negative for rash and skin lesion changes  Neurologic Examination:                                                                                                      Blood pressure 179/81, pulse 62, temperature 98.5 F (36.9 C), temperature source Oral, resp. rate 17, SpO2 100.00%.  Mental Status: Alert, expressive and receptive aphasia, very labile and crying.  Cranial Nerves: II: Discs flat bilaterally; Visual fields shows a right hemianopsia, pupils equal, round, reactive to light and accommodation III,IV, VI: ptosis not present, extra-ocular motions intact bilaterally V,VII: smile symmetric, facial light touch sensation normal bilaterally VIII: hearing normal bilaterally IX,X: gag reflex present XI: bilateral shoulder shrug XII: midline tongue extension Motor: Moving all  extremities antigravity with 4/5strength (right UE slightly weaker then left) Tone and bulk:normal tone throughout; no atrophy noted Sensory: Pinprick and light touch intact throughout, bilaterally Deep Tendon Reflexes:  Right: Upper Extremity   Left: Upper extremity   biceps (C-5 to C-6) 2/4   biceps (C-5 to C-6) 2/4 tricep (C7) 2/4    triceps (C7) 2/4 Brachioradialis (C6) 2/4  Brachioradialis (C6) 2/4  Lower Extremity Lower Extremity  quadriceps (L-2 to L-4) 2/4   quadriceps (L-2 to L-4) 2/4 Achilles (S1) 2/4   Achilles (S1) 2/4  Plantars: Right: downgoing   Left: upgoing Cerebellar: normal finger-to-nose,  normal heel-to-shin test Gait: not tested CV: pulses palpable throughout    Results for orders placed during the hospital encounter of 09/24/12 (from the past 48 hour(s))  PROTIME-INR     Status: Abnormal   Collection Time    09/24/12  9:30 AM      Result Value Range   Prothrombin Time 11.4 (*) 11.6 - 15.2 seconds   INR 0.84  0.00 - 1.49  APTT     Status: None   Collection Time    09/24/12  9:30 AM      Result Value Range   aPTT 30  24 - 37 seconds  CBC     Status: Abnormal   Collection Time    09/24/12  9:30 AM      Result Value Range   WBC 8.5  4.0 - 10.5 K/uL   RBC 5.40  4.22 - 5.81 MIL/uL   Hemoglobin 12.9 (*) 13.0 - 17.0 g/dL   HCT 16.1  09.6 - 04.5 %   MCV 72.6 (*) 78.0 - 100.0 fL   MCH 23.9 (*) 26.0 - 34.0 pg   MCHC 32.9  30.0 - 36.0 g/dL   RDW 40.9 (*) 81.1 - 91.4 %   Platelets 228  150 - 400 K/uL  DIFFERENTIAL     Status: None   Collection Time    09/24/12  9:30 AM      Result Value Range   Neutrophils Relative % 67  43 - 77 %   Neutro Abs 5.7  1.7 - 7.7 K/uL   Lymphocytes Relative 21  12 - 46 %   Lymphs Abs 1.8  0.7 - 4.0 K/uL   Monocytes Relative 9  3 - 12 %   Monocytes Absolute 0.7  0.1 - 1.0 K/uL   Eosinophils Relative 3  0 - 5 %   Eosinophils Absolute 0.3  0.0 - 0.7 K/uL   Basophils Relative 1  0 - 1 %   Basophils Absolute 0.0  0.0 - 0.1  K/uL  COMPREHENSIVE METABOLIC PANEL     Status: Abnormal   Collection Time    09/24/12  9:30 AM      Result Value Range   Sodium 134 (*) 135 - 145 mEq/L   Potassium 4.3  3.5 - 5.1 mEq/L   Chloride 97  96 - 112 mEq/L   CO2 25  19 - 32 mEq/L   Glucose, Bld 106 (*) 70 - 99 mg/dL   BUN 28 (*) 6 - 23 mg/dL   Creatinine, Ser 1.91  0.50 - 1.35 mg/dL   Calcium 9.6  8.4 - 47.8 mg/dL   Total Protein 7.8  6.0 - 8.3 g/dL   Albumin 3.4 (*) 3.5 - 5.2 g/dL   AST 26  0 - 37 U/L   ALT 28  0 - 53 U/L   Alkaline Phosphatase 92  39 - 117 U/L   Total Bilirubin 0.3  0.3 - 1.2 mg/dL   GFR calc non Af Amer >90  >90 mL/min   GFR calc Af Amer >90  >90 mL/min   Comment: (NOTE)     The eGFR has been calculated using the CKD EPI equation.     This calculation has not been validated in all clinical situations.     eGFR's persistently <90 mL/min signify possible Chronic Kidney     Disease.  TROPONIN I     Status: None   Collection Time    09/24/12  9:30 AM      Result Value Range   Troponin I <0.30  <0.30 ng/mL   Comment:            Due to the release kinetics of cTnI,     a negative result within the first hours     of the onset of symptoms does not rule out     myocardial infarction with certainty.     If myocardial infarction is still suspected,     repeat the test at appropriate intervals.  POCT I-STAT TROPONIN I     Status: None   Collection Time    09/24/12  9:50 AM      Result Value Range   Troponin i, poc 0.06  0.00 - 0.08 ng/mL   Comment 3            Comment: Due to the release kinetics of cTnI,     a negative result within the first hours     of the onset of symptoms does not rule out     myocardial infarction with certainty.     If myocardial infarction is still suspected,     repeat the test at appropriate intervals.   Dg Chest 2 View  09/24/2012   CLINICAL DATA:  Weakness, aphasia  EXAM: CHEST  2 VIEW  COMPARISON:  Chest radiograph 07/11/2012, 07/10/2012  FINDINGS: Stable enlarged  cardiac silhouette. There is a prominent vascular shadow in the right paramediastinal upper lobe unchanged from prior. There are low lung volumes. No focal consolidation. No pneumothorax.  No pleural fluid. Percutaneous gastrostomy tube noted.  IMPRESSION: Cardiomegaly without acute cardiopulmonary findings. Low lung volumes.   Electronically Signed   By: Genevive Bi M.D.   On: 09/24/2012 10:17   Ct Head Wo Contrast  09/24/2012   *RADIOLOGY REPORT*  Clinical Data: Increased speech difficulty.  Facial droop.  Prior stroke 2009.  Hypertension.  CT HEAD WITHOUT CONTRAST  Technique:  Contiguous axial images were obtained from the base of the skull through the vertex without contrast.  Comparison: 05/17/2007 MR.  Several CTs between 06/21/2012 and 07/03/2012.  Findings: Remote infarct posterior right caudate/lenticular nucleus (2009).  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  The mass effect and hemorrhage has improved since the 07/03/2012 examination with resolution of midline shift to the right.  Currently, mild hyperdensity along the periphery of the left parietal - occipital lobe insult (series 2 images 18 through 20) may represent laminar necrosis although a small amount of acute hemorrhage cannot be entirely excluded.  Remote infarcts frontal lobes greater on the left.  Prominent small vessel disease type changes.  No large acute infarct separate from the above described findings. If further delineation is clinically desired MR imaging may prove helpful.  Baseline atrophy without hydrocephalus.  No intracranial mass lesions seen separate from the above described findings.  Lack of contrast limits evaluation for detection of small lesion.  Vascular calcifications.  IMPRESSION: Remote infarct posterior right caudate/lenticular nucleus (2009).  The patient sustained diffuse hemorrhage in the left parietal - occipital  lobe which may be related to TPA combined with prior infarct.  The mass effect and hemorrhage has improved since the 07/03/2012 examination with resolution of midline shift to the right.  Currently, mild hyperdensity along the periphery of the left parietal - occipital lobe insult (series 2 images 18 through 20) may represent laminar necrosis although a small amount of acute hemorrhage cannot be entirely excluded.  Remote infarcts frontal lobes greater on the left.  Prominent small vessel disease type changes.  No large acute infarct separate from the above described findings. If further delineation is clinically desired MR imaging may prove helpful.  Baseline atrophy   Original Report Authenticated By: Lacy Duverney, M.D.   Stroke workup 06/2012  Ct Head Wo Contrast  07/03/2012 No significant change. Blood in the occipital horn of the right lateral ventricle is more apparent. Midline shift is essentially unchanged. No new hemorrhage on the left.  06/26/2012 Overall slight improvement in the degree of midline shift compared with priors. No new areas of infarction. No convincing signs for trapped right lateral ventricle on today's exam. Stable multifocal predominantly left MCA territory infarcts with hemorrhagic transformation; no new infarcts or new lobar hemorrhage from priors.  06/24/2012 Large left parietal - frontal - posterior left temporal lobe hemorrhagic infarct and mass effect upon the left lateral ventricle relatively similar to the recent exam. Interval development of interventricular blood within the tract right lateral ventricle. Subarachnoid blood most notable right convexity and right sylvian fissure more apparent than on the prior exam. Bilateral anterior frontal lobe infarcts greater on the left with minimal associated petechial hemorrhage.  06/23/2012 Minimal change in the large left hemorrhagic infarct. Minimal change in the midline shift.  06/22/2012 Image quality degraded by significant  motion. There is a large area of hemorrhage in the left parietal lobe with mass effect and 10 mm midline shift. 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  06/22/2012 Ct Portable Head W/o Cm Large area of hemorrhagic infarction left parietal lobe is similar. There is improvement in blood in the corpus collosum/ caudate on the left. 8 mm midline shift to the right without hydrocephalus.  06/21/2012 Chronic ischemic changes, right greater than left. No acute abnormality.  MRI of the brain  MRA of the brain  2D Echocardiogram ejection fraction was in the range of 60% to 65%. Possible mild hypokinesis of the apical myocardium. - Aortic valve: Trivial regurgitation. - Mitral valve: Moderate regurgitation. - Left atrium: The atrium was mildly dilated. - Right ventricle: There was moderate hypertrophy. - Right atrium: The atrium was mildly dilated. - Pulmonary arteries: Systolic pressure was mildly  Carotid Doppler Technically limited due to right IJ line and patient immobility. No evidence of significant ICA stenosis noted. Antegrade vertebral flow.    Assessment and plan discussed with with attending physician and they are in agreement.    Felicie Morn PA-C [facility] [phone]  09/24/2012, 12:20 PM   Assessment: 57 y.o. male with possible extension of left MCA stroke with exam consistent with worsening aphasia.   Recommend: 1) Continue to hold antiplatelet 2) MRI brain without contrast 3) Stroke team to follow in the AM.    Stroke Risk Factors - hypertension and stroke  Patient seen and examined together with physician assistant and I concur with the assessment and plan.  Wyatt Portela, MD

## 2012-09-24 NOTE — ED Notes (Signed)
Pt incontinent of soft brown stool-- small-- pea sized area at base of coccyx that is reddened and opened.

## 2012-09-24 NOTE — ED Provider Notes (Signed)
Patient seen/examined in the Emergency Department in conjunction with Resident Physician Provider Hess Patient presents with increased aphasia and confusion Exam : awake/alert, significant dysarthria noted Plan: CT head/labs ordered.  EKG does appear changed, though I doubt acute MI given history/exam.  tPA in stroke considered but not given due to:  Onset over 3-4.5hours (unclear time of onset)     Joya Gaskins, MD 09/24/12 [phone]

## 2012-09-25 DIAGNOSIS — I6350 Cerebral infarction due to unspecified occlusion or stenosis of unspecified cerebral artery: Secondary | ICD-10-CM | POA: Diagnosis not present

## 2012-09-25 DIAGNOSIS — R471 Dysarthria and anarthria: Secondary | ICD-10-CM | POA: Diagnosis not present

## 2012-09-25 LAB — HEMOGLOBIN A1C
Hgb A1c MFr Bld: 6.3 % — ABNORMAL HIGH (ref <5.7)
Mean Plasma Glucose: 134 mg/dL — ABNORMAL HIGH (ref <117)

## 2012-09-25 LAB — URINE CULTURE: Colony Count: NO GROWTH

## 2012-09-25 MED ORDER — ASPIRIN EC 81 MG PO TBEC: 81.0000 mg | DELAYED_RELEASE_TABLET | Freq: Every day | ORAL | Status: DC

## 2012-09-25 MED ORDER — ISOSORBIDE DINITRATE 40 MG PO TABS: 40.0000 mg | ORAL_TABLET | Freq: Three times a day (TID) | ORAL | Status: DC

## 2012-09-25 MED ORDER — METOPROLOL TARTRATE 25 MG PO TABS: 25.0000 mg | ORAL_TABLET | Freq: Four times a day (QID) | ORAL | Status: DC

## 2012-09-25 MED ORDER — LISINOPRIL 40 MG PO TABS: 40.0000 mg | ORAL_TABLET | Freq: Every day | ORAL | Status: DC

## 2012-09-25 MED ORDER — HYDRALAZINE HCL 20 MG/ML IJ SOLN: 5.0000 mg | INTRAMUSCULAR | Status: DC | PRN

## 2012-09-25 MED ORDER — HYDRALAZINE HCL 50 MG PO TABS: 50.0000 mg | ORAL_TABLET | Freq: Four times a day (QID) | ORAL | Status: DC

## 2012-09-25 NOTE — Progress Notes (Signed)
Pt was having non sustained V-Tach on tele monitor for about 2 minutes. Pt was asymptomatic. No chest pain, no SOB. V/s stable. MD on call was notified. No new orders given. Will continue to monitor.

## 2012-09-25 NOTE — Progress Notes (Signed)
Subjective: No complaints today. Lying in bed. Wife wants to take patient back to rehab facility today.   Objective: Vital signs in last 24 hours: Filed Vitals:   09/24/12 2236 09/25/12 0017 09/25/12 0208 09/25/12 0449  BP: 171/83 139/69 158/71 166/93  Pulse: 66 65 56 57  Temp: 97.8 F (36.6 C)  97.8 F (36.6 C) 97.3 F (36.3 C)  TempSrc: Oral  Axillary Oral  Resp: 16  16 18   Height:      Weight:      SpO2: 100% 100% 100% 99%   Weight change:  No intake or output data in the 24 hours ending 09/25/12 1107 . GENERAL- alert, but speech is gabbled, appears as stated age,  HEENT- Atraumatic, normocephalic, PERRL, EOMI, oral mucosa appears moist, No carotid bruit. CARDIAC- RRR, no murmurs, rubs or gallops.  RESP- Moving equal volumes of air, and clear to auscultation bilaterally.  ABDOMEN- Soft, nontender, PEG tube present, no palpable masses or organomegaly, bowel sounds present.  NEURO- No obvious facial droop, Strenght reduced in both upper extremities- could not be assessed, as patient became emotional. Lower extr- strenght- 4/5 in Left, no movemnt on RT.   Lab Results: Basic Metabolic Panel:  Recent Labs Lab 09/24/12 0930  NA 134*  K 4.3  CL 97  CO2 25  GLUCOSE 106*  BUN 28*  CREATININE 0.83  CALCIUM 9.6   Liver Function Tests:  Recent Labs Lab 09/24/12 0930  AST 26  ALT 28  ALKPHOS 92  BILITOT 0.3  PROT 7.8  ALBUMIN 3.4*   CBC:  Recent Labs Lab 09/24/12 0930  WBC 8.5  NEUTROABS 5.7  HGB 12.9*  HCT 39.2  MCV 72.6*  PLT 228   Cardiac Enzymes:  Recent Labs Lab 09/24/12 0930 09/24/12 2013  TROPONINI <0.30 <0.30   CBG:  Recent Labs Lab 09/24/12 1806 09/24/12 2026  GLUCAP 101* 97   Hemoglobin A1C:  Recent Labs Lab 09/24/12 2018  HGBA1C 6.3*   Coagulation:  Recent Labs Lab 09/24/12 0930  LABPROT 11.4*  INR 0.84   Urinalysis:  Recent Labs Lab 09/24/12 1301  COLORURINE YELLOW  LABSPEC 1.017  PHURINE 8.0  GLUCOSEU  NEGATIVE  HGBUR NEGATIVE  BILIRUBINUR NEGATIVE  KETONESUR NEGATIVE  PROTEINUR 100*  UROBILINOGEN 1.0  NITRITE NEGATIVE  LEUKOCYTESUR TRACE*   Micro Results: No results found for this or any previous visit (from the past 240 hour(s)). Studies/Results: Dg Chest 2 View  09/24/2012   CLINICAL DATA:  Weakness, aphasia  EXAM: CHEST  2 VIEW  COMPARISON:  Chest radiograph 07/11/2012, 07/10/2012  FINDINGS: Stable enlarged cardiac silhouette. There is a prominent vascular shadow in the right paramediastinal upper lobe unchanged from prior. There are low lung volumes. No focal consolidation. No pneumothorax. No pleural fluid. Percutaneous gastrostomy tube noted.  IMPRESSION: Cardiomegaly without acute cardiopulmonary findings. Low lung volumes.   Electronically Signed   By: Genevive Bi M.D.   On: 09/24/2012 10:17   Ct Head Wo Contrast  09/24/2012   *RADIOLOGY REPORT*  Clinical Data: Increased speech difficulty.  Facial droop.  Prior stroke 2009.  Hypertension.  CT HEAD WITHOUT CONTRAST   IMPRESSION: Remote infarct posterior right caudate/lenticular nucleus (2009).  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  The mass effect and hemorrhage has improved since the 07/03/2012 examination with resolution of midline shift to the right.  Currently, mild hyperdensity along the periphery of the left  parietal - occipital lobe insult (series 2 images 18 through 20) may represent laminar necrosis although a small amount of acute hemorrhage cannot be entirely excluded.  Remote infarcts frontal lobes greater on the left.  Prominent small vessel disease type changes.  No large acute infarct separate from the above described findings. If further delineation is clinically desired MR imaging may prove helpful.  Baseline atrophy   Original Report Authenticated By: Lacy Duverney, M.D.   Mr Brain Wo Contrast  09/24/2012   *RADIOLOGY  REPORT*  Clinical Data: History of stroke and hemorrhage.  Rule out acute infarct  MRI HEAD WITHOUT CONTRAST    IMPRESSION: Probable small acute infarct in the deep white matter on the left.  Extensive chronic ischemic change.  Late subacute  hematoma left parietal lobe measures 45 x 38 mm.  There are areas of chronic hemorrhage bilaterally.   Original Report Authenticated By: Janeece Riggers, M.D.   Medications: I have reviewed the patient's current medications. Scheduled Meds: . cloNIDine  0.3 mg Transdermal Weekly  . haloperidol  2 mg Per Tube BID  . insulin aspart  0-15 Units Subcutaneous Q4H  . levETIRAcetam  500 mg Per Tube BID  . sertraline  50 mg Per Tube Daily  . sodium chloride  3 mL Intravenous Q12H   Continuous Infusions:  PRN Meds:.hydrALAZINE, ondansetron (ZOFRAN) IV, ondansetron, oxyCODONE Assessment/Plan:  CVA ( Cerebral infarction)- Considering new onset of worsening dysartria, and resolved facial droop, a new CVA is most likely. MRI findings are of- Probable small acute infarct in deep white matter on the left. Patient has had extensive work up for possible aetiologies of early onset of stroke in 2009. HBA1c- 6.3 - 09/25/2012. Troponins- X2 negative. Urinalysis- trace leukocytes,   - Allow permissive HTN,but not above 180/110, but continue clonidine patch 0.3mg  TD weekly, to prevent rebound HTN.  - NPO- SLP, Nutrition consult pending.  - HBA1c  - Levetiracetam- 500mg  BID  - PT/OT consult- Awaiting - Speech Therapist- Awaiting  - Ondansetron 4mg  Q6h for nausea.  - Awaiting Neuro Consult and recommendations.  - Consult nutritionist to determine meal per tube.  - Consider statin use, Last LDL- 109, 06/22/2012.  - Family wants pt to be discharged home today.  Malignant HTN- Blood pressure has been above normal as reported by family despite multiple medications and compliance. Blood pressure- 208/106 on admission. Blood pressure this morning- 166/93.  - Will continue Clonidine-  0.3mg  TD weekly.  - Change Hydralazine to- 50mg  PRN.   DM2- Well controlled- HBA1c- 6.2, done 06/25/2102. Home meds- Metformin- 1000mg  daily.  - Insulin- SSI- Moderate   Microcytic Anemia- HB and HCt currently stable. HB- 12.9. MCV- 72.6.  -As per chat no workup so far for Microcytic anemia, family refusing any more blood draws, want pt discharged home today. Stool for FOBT, Fe studies not done.    DVT PPX- SCDs.  Dispo: Disposition is deferred at this time, awaiting improvement of current medical problems.   The patient does have a current PCP (Erlinda Hong, MD) and does need an [facility] hospital follow-up appointment after discharge.  The patient does not know have transportation limitations that hinder transportation to clinic appointments.  .Services Needed at time of discharge: Y = Yes, Blank = No PT:   OT:   RN:   Equipment:   Other:     LOS: 1 day   Kennis Carina, MD 09/25/2012, 11:07 AM

## 2012-09-25 NOTE — H&P (Signed)
  Date: 09/25/2012  Patient name: [name]  Medical record number: [id number]  Date of birth: [birth year]   I have seen and evaluated [name] and discussed their care with the Residency Team. Dr Carita Pian was admitted for sxs of a new CVA and MRI detected a small acute infarct in the L deep white matter. He is not an antiplt candidate 2/2 ischemic CVA with hemorraghic conversion in June 2014. He is currently in a SNF.  Assessment and Plan: I have seen and evaluated the patient as outlined above. I agree with the formulated Assessment and Plan as detailed in the residents' admission note, with the following changes:   1. New ischemic CVA - not an antiplt candidate. Very little can be done other than control BP and resume statin (had been on per 07/13/12 note but on an admit med list). Await additional input from neuro.  2. HTN - Very labile from 139/69 - 208/106. Currently at 166/93. He is on sch clonidine to prevent rebound HTN and his hydralazine has been changed from sch to PRN to prevent further drops in BP.   3. Dispo - family wants him to return to SNF today. Will wait for neuro rec's.   Burns Spain, MD 9/16/201411:38 AM

## 2012-09-25 NOTE — ED Provider Notes (Signed)
I have personally seen and examined the patient.  I have discussed the plan of care with the resident.  I have reviewed the documentation on PMH/FH/Soc. History.  I have reviewed the documentation of the resident and agree.  I have reviewed and agree with the ECG interpretation(s) documented by the resident.  UNABLE TO COMPLETE FULL REVIEW OF SYSTEM DUE TO MENTAL STATUS CHANGE - LEVEL 5 CAVEAT  Joya Gaskins, MD 09/25/12 [phone]

## 2012-09-25 NOTE — Progress Notes (Signed)
Family very insistent that pt goes back to his SNF today with or without SLP session.  SLP paged X3, no response.  Hospitalist notified, plan is to discharge even without SLP.

## 2012-09-25 NOTE — Clinical Social Work Note (Signed)
CSW spoke with Laurena Bering, SNF where pt was previously residing. Admissions SW stated that pt could return to SNF today once medically discharged from [facility].   CSW met with pt and pt's wife at bedside. Pt's wife stated that she would like for Neurology and Speech Therapy to see pt before they are discharged. CSW consulted with MD regarding information above.   CSW will continue to assist with discharge planning needs.  Darlyn Chamber, MSW, LCSWA Clinical Social Work [phone]

## 2012-09-25 NOTE — Progress Notes (Signed)
Stroke Team Progress Note  SUBJECTIVE Stable. No new complaints. Speech diff/aphasia still present.  OBJECTIVE Most recent Vital Signs: Filed Vitals:   09/24/12 2236 09/25/12 0017 09/25/12 0208 09/25/12 0449  BP: 171/83 139/69 158/71 166/93  Pulse: 66 65 56 57  Temp: 97.8 F (36.6 C)  97.8 F (36.6 C) 97.3 F (36.3 C)  TempSrc: Oral  Axillary Oral  Resp: 16  16 18   Height:      Weight:      SpO2: 100% 100% 100% 99%   CBG (last 3)   Recent Labs  09/24/12 1806 09/24/12 2026  GLUCAP 101* 97    IV Fluid Intake:     MEDICATIONS  . cloNIDine  0.3 mg Transdermal Weekly  . haloperidol  2 mg Per Tube BID  . insulin aspart  0-15 Units Subcutaneous Q4H  . levETIRAcetam  500 mg Per Tube BID  . sertraline  50 mg Per Tube Daily  . sodium chloride  3 mL Intravenous Q12H   PRN:  hydrALAZINE, ondansetron (ZOFRAN) IV, ondansetron, oxyCODONE   CLINICALLY SIGNIFICANT STUDIES Basic Metabolic Panel:  Recent Labs Lab 09/24/12 0930  NA 134*  K 4.3  CL 97  CO2 25  GLUCOSE 106*  BUN 28*  CREATININE 0.83  CALCIUM 9.6   Liver Function Tests:  Recent Labs Lab 09/24/12 0930  AST 26  ALT 28  ALKPHOS 92  BILITOT 0.3  PROT 7.8  ALBUMIN 3.4*   CBC:  Recent Labs Lab 09/24/12 0930  WBC 8.5  NEUTROABS 5.7  HGB 12.9*  HCT 39.2  MCV 72.6*  PLT 228   Coagulation:  Recent Labs Lab 09/24/12 0930  LABPROT 11.4*  INR 0.84   Cardiac Enzymes:  Recent Labs Lab 09/24/12 0930 09/24/12 2013  TROPONINI <0.30 <0.30   Urinalysis:  Recent Labs Lab 09/24/12 1301  COLORURINE YELLOW  LABSPEC 1.017  PHURINE 8.0  GLUCOSEU NEGATIVE  HGBUR NEGATIVE  BILIRUBINUR NEGATIVE  KETONESUR NEGATIVE  PROTEINUR 100*  UROBILINOGEN 1.0  NITRITE NEGATIVE  LEUKOCYTESUR TRACE*   Lipid Panel    Component Value Date/Time   CHOL 185 06/22/2012 0525   TRIG 294* 06/28/2012 0600   HDL 58 06/22/2012 0525   CHOLHDL 3.2 06/22/2012 0525   VLDL 18 06/22/2012 0525   LDLCALC 109* 06/22/2012 0525    HgbA1C  Lab Results  Component Value Date   HGBA1C 6.3* 09/24/2012    Urine Drug Screen:   No results found for this basename: labopia, cocainscrnur, labbenz, amphetmu, thcu, labbarb    Alcohol Level: No results found for this basename: ETH,  in the last 168 hours  CT of the brain  09/24/2012    Remote infarct posterior right caudate/lenticular nucleus (2009).  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  The mass effect and hemorrhage has improved since the 07/03/2012 examination with resolution of midline shift to the right.  Currently, mild hyperdensity along the periphery of the left parietal - occipital lobe insult (series 2 images 18 through 20) may represent laminar necrosis although a small amount of acute hemorrhage cannot be entirely excluded.  Remote infarcts frontal lobes greater on the left.  Prominent small vessel disease type changes.  No large acute infarct separate from the above described findings. If further delineation is clinically desired MR imaging may prove helpful.  Baseline atrophy    MRI of the brain  09/24/2012  : Probable small acute infarct  in the deep white matter on the left.  Extensive chronic ischemic change.  Late subacute  hematoma left parietal lobe measures 45 x 38 mm.  There are areas of chronic hemorrhage bilaterally.   CXR  09/24/2012  Cardiomegaly without acute cardiopulmonary findings. Low lung volumes.   Therapy Recommendations SNF  Physical Exam   GENERAL EXAM: Patient is in no distress  CARDIOVASCULAR: Regular rate and rhythm, no murmurs, no carotid bruits  NEUROLOGIC: MENTAL STATUS: awake, alert, EXP AND RECEP APHASIA. FOLLOWS SIMPLE COMMANDS.  CRANIAL NERVE: pupils equal and reactive to light, DECR RIGHT VISUAL FIELDS. extraocular muscles intact, no nystagmus, facial sensation and strength symmetric, uvula midline, shoulder shrug symmetric, tongue  midline. MOTOR: RUE 4, RLE 4. LEFT SIDE FULL STRENGTH. SENSORY: DECR ON RIGHT SIDE   ASSESSMENT [name] is a 57 y.o. male presenting with worsening aphasia.  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 Stroke was felt to be embolic secondary to acute MI. Current stroke due to small vessel disease. Workup completed in June; no indication to repeat.  On no antithrombotics prior to admission. Now on no antithrombotics for secondary stroke prevention.   Hospital day # 1  TREATMENT/PLAN  Dr. Marjory Lies reviewed previous imaging along with current CT. Blood has become iso/hypodense. Ok to add aspirin 81 mg orally every day for secondary stroke prevention at this time No further stroke workup indicated. Agree with planned discharge back to SNF today Patient has a 10-15% risk of having another stroke over the next year, the highest risk is within 2 weeks of the most recent stroke/TIA (risk of having a stroke following a stroke or TIA is the same). Ongoing risk factor control by Primary Care Physician Stroke Service will sign off. Please call should any needs arise. Follow up with Dr. Pearlean Brownie, Stroke Clinic, in 2 months.  Discussed with attending/medical team.  Annie Main, MSN, RN, ANVP-BC, ANP-BC, GNP-BC [facility] Pager: [phone] 09/25/2012 1:46 PM  I evaluated and examined patient, reviewed records, labs and imaging, and agree with note and plan. Small new left subcortical stroke. Chronic left parietal hemorrhage. Ok to add low dose aspirin 81mg  daily and discharge per primary team.  Suanne Marker, MD 09/25/2012, 10:55 PM Certified in Neurology, Neurophysiology and Neuroimaging [facility] - Stroke Team  Please refer to [email or web address] for on-call Stroke MD

## 2012-09-25 NOTE — Progress Notes (Signed)
Troponin level was not drawn, per wife no more lab works to be done as pt refused it. They refused CBC and BMET to be drawn also @ 0500 am.  MD on call was been informed.

## 2012-09-25 NOTE — Evaluation (Signed)
Clinical/Bedside Swallow Evaluation Patient Details  Name: Curran Lenderman MRN: [id number] Date of Birth: 12/05/1955  Today's Date: 09/25/2012 Time: 4098-1191 SLP Time Calculation (min): 21 min  Past Medical History:  Past Medical History  Diagnosis Date  . Stroke 2007  . Hypertension   . Difficult airway for intubation 06/22/2012    Deep and anterior. Difficult with standard laryngoscope. Easily intubated with Glidescope   . Coronary artery disease     Stent placement   Past Surgical History:  Past Surgical History  Procedure Laterality Date  . Coronary stent placement    . Cardiac surgery     HPI:  45 y o male with PMH of Rt cortical stroke- 2009- Left sided weakness, And LMCA stroke, became diffuse hrr stroke after TPA use -June 2014, CAD s/p 2 stent placement-2003, HTN, hyperlipidemia. Presented with aphasia noticed this mornig when his wife came to visit him at the re-hab facitlity where he stays at about 6.30am this morning. But wife says prior to today he was able to understand and verbalize in few short phrases. She noticed this morning he was not able to talk at all, but later was able to talk but speech was not understandable. MRI findings are of- Probable small acute infarct in deep white matter on the left.   Assessment / Plan / Recommendation Clinical Impression  Pt seen just prior to d/c back to SNF. Pt had recent MBS this month recommending nectar thick liquids and mechanical soft solids which his wife reports he is tolerating well. Pt continues to demonstrate a mild oral dysphagia. No evidence of aspiration iwth nectar thick liquids. Swallow response continues to be slow, would not suggest any upgrade at this time. Pt will need f/u at SNF from SLP for swallow and communication. Dysarthria is more severe.     Aspiration Risk       Diet Recommendation Dysphagia 3 (Mechanical Soft);Nectar-thick liquid   Liquid Administration via: Cup;Straw (could attempt a short  straw) Medication Administration: Whole meds with puree Supervision: Patient able to self feed;Full supervision/cueing for compensatory strategies Compensations: Slow rate;Small sips/bites Postural Changes and/or Swallow Maneuvers: Seated upright 90 degrees    Other  Recommendations Oral Care Recommendations: Oral care BID Other Recommendations: Order thickener from pharmacy;Prohibited food (jello, ice cream, thin soups);Remove water pitcher   Follow Up Recommendations  Skilled Nursing facility    Frequency and Duration        Pertinent Vitals/Pain NA    SLP Swallow Goals     Swallow Study Prior Functional Status       General HPI: 46 y o male with PMH of Rt cortical stroke- 2009- Left sided weakness, And LMCA stroke, became diffuse hrr stroke after TPA use -June 2014, CAD s/p 2 stent placement-2003, HTN, hyperlipidemia. Presented with aphasia noticed this mornig when his wife came to visit him at the re-hab facitlity where he stays at about 6.30am this morning. But wife says prior to today he was able to understand and verbalize in few short phrases. She noticed this morning he was not able to talk at all, but later was able to talk but speech was not understandable. MRI findings are of- Probable small acute infarct in deep white matter on the left. Type of Study: Bedside swallow evaluation Previous Swallow Assessment: MBS 09/12/11 - Dys 3/nectar Diet Prior to this Study: Thin liquids;Regular Temperature Spikes Noted: No Respiratory Status: Room air History of Recent Intubation: No Behavior/Cognition: Alert;Requires cueing;Cooperative Oral Cavity - Dentition: Adequate natural dentition  Self-Feeding Abilities: Able to feed self;Needs assist Patient Positioning: Upright in bed Baseline Vocal Quality: Low vocal intensity Volitional Cough: Cognitively unable to elicit Volitional Swallow: Unable to elicit    Oral/Motor/Sensory Function Overall Oral Motor/Sensory Function: Impaired  at baseline Labial ROM: Reduced right Labial Symmetry: Abnormal symmetry right Labial Strength: Reduced Lingual ROM:  (pt does not protrude tongue)   Ice Chips     Thin Liquid Thin Liquid: Not tested    Nectar Thick Nectar Thick Liquid: Impaired Presentation: Cup Oral Phase Impairments: Reduced labial seal Oral phase functional implications: Right anterior spillage;Prolonged oral transit Pharyngeal Phase Impairments: Suspected delayed Swallow   Honey Thick Honey Thick Liquid: Not tested   Puree Puree: Within functional limits Presentation: Spoon   Solid   GO Functional Assessment Tool Used: clinical judgment Functional Limitations: Swallowing Swallow Current Status (Z6109): At least 20 percent but less than 40 percent impaired, limited or restricted Swallow Goal Status [phone]): At least 20 percent but less than 40 percent impaired, limited or restricted Swallow Discharge Status [phone]): At least 20 percent but less than 40 percent impaired, limited or restricted  Solid: Within functional limits      [facility], MA CCC-SLP [phone]  Claudine Mouton 09/25/2012,3:34 PM

## 2012-09-25 NOTE — Progress Notes (Signed)
OT Cancellation Note  Patient Details Name: [name] MRN: [id number] DOB: 25-Oct-1955   Cancelled Treatment:    Reason Eval/Treat Not Completed: OT screened, no needs identified, will sign off  Order received, reviewed chart. Pt planning to d/c to SNF for rehab and possibly today. All further needs can be met in the next venue of care.   Earlie Raveling OTR/L [phone] 09/25/2012, 1:41 PM

## 2012-09-25 NOTE — Discharge Summary (Signed)
Name: [name] MRN: [id number] DOB: 05-Feb-1955 57 y.o. PCP: Erlinda Hong, MD  Date of Admission: 09/24/2012  8:18 AM Date of Discharge: 09/25/2012 Attending Physician: Burns Spain, MD  Discharge Diagnosis:  Principal Problem:   CVA (cerebral infarction) Active Problems:   Dysphasia   Malignant hypertension   Microcytic anemia  Discharge Medications:   Medication List    ASK your doctor about these medications       acetaminophen 325 MG tablet  Commonly known as:  TYLENOL  Give 650 mg by tube every 6 (six) hours as needed (elevated temperature).     clonazePAM 0.5 MG tablet  Commonly known as:  KLONOPIN  Take 0.5 mg by mouth every 12 (twelve) hours.     cloNIDine 0.3 mg/24hr patch  Commonly known as:  CATAPRES - Dosed in mg/24 hr  Place 1 patch onto the skin once a week.     erythromycin ethylsuccinate 200 MG/5ML suspension  Commonly known as:  EES  Take 250 mg by mouth every 6 (six) hours.     feeding supplement (JEVITY 1.2 CAL) Liqd  Place 1,000 mLs into feeding tube daily. 90 ml /hr off at 8am start at 2 pm     feeding supplement Liqd  Take 30 mLs by mouth daily.     haloperidol 2 MG tablet  Commonly known as:  HALDOL  Take 2 mg by mouth 2 (two) times daily.     hydrALAZINE 50 MG tablet  Commonly known as:  APRESOLINE  Take 50 mg by mouth every 6 (six) hours.     isosorbide dinitrate 40 MG tablet  Commonly known as:  ISORDIL  Take 40 mg by mouth 3 (three) times daily.     levETIRAcetam 500 MG tablet  Commonly known as:  KEPPRA  Take 500 mg by mouth every 12 (twelve) hours.     lisinopril 40 MG tablet  Commonly known as:  PRINIVIL,ZESTRIL  Take 40 mg by mouth daily.     metFORMIN 500 MG 24 hr tablet  Commonly known as:  GLUCOPHAGE-XR  Take 1,000 mg by mouth daily with breakfast.     metoprolol tartrate 25 MG tablet  Commonly known as:  LOPRESSOR  Take 25 mg by mouth every 6 (six) hours.     oxyCODONE 5 MG immediate release  tablet  Commonly known as:  Oxy IR/ROXICODONE  5 mg by PEG Tube route.     oxyCODONE 5 MG immediate release tablet  Commonly known as:  Oxy IR/ROXICODONE  Take 5 mg by mouth every 8 (eight) hours.     polyethylene glycol packet  Commonly known as:  MIRALAX / GLYCOLAX  Take 17 g by mouth 2 (two) times daily.     polyvinyl alcohol 1.4 % ophthalmic solution  Commonly known as:  LIQUIFILM TEARS  Place 1 drop into both eyes 2 (two) times daily.     sertraline 50 MG tablet  Commonly known as:  ZOLOFT  Take 50 mg by mouth daily.     sterile water for irrigation  Irrigate with 200 mLs as directed 2 (two) times daily.        Disposition and follow-up:   Mr.Hisham Xxxflomo was discharged from [facility] in Fair condition.  At the hospital follow up visit please address:  1.  Please follow up on patients blood pressure control. All anti HTN except Clonidine were held on admit 2/2 acute ischemic CVA. All anti-HTN meds will need to be restarted  at the SNF.   2.  Statin not seen on patients medication list, but consideration for starting statin. Atorvastatin was on med list during July admit but fell of med list.  3.  Microcytic anaemia, patients family did not want any more blood work done. Consider iron studies.  Follow-up Appointments:   Discharge Instructions:   Consultations:  Neurology  Procedures Performed:  Dg Chest 2 View  09/24/2012   CLINICAL DATA:  Weakness, aphasia  EXAM: CHEST  2 VIEW  COMPARISON:  Chest radiograph 07/11/2012, 07/10/2012  FINDINGS: Stable enlarged cardiac silhouette. There is a prominent vascular shadow in the right paramediastinal upper lobe unchanged from prior. There are low lung volumes. No focal consolidation. No pneumothorax. No pleural fluid. Percutaneous gastrostomy tube noted.  IMPRESSION: Cardiomegaly without acute cardiopulmonary findings. Low lung volumes.   Electronically Signed   By: Genevive Bi M.D.   On: 09/24/2012  10:17   Ct Head Wo Contrast  09/24/2012   *RADIOLOGY REPORT*  Clinical Data: Increased speech difficulty.  Facial droop.  Prior stroke 2009.  Hypertension.  CT HEAD WITHOUT CONTRAST  Technique:  Contiguous axial images were obtained from the base of the skull through the vertex without contrast.  Comparison: 05/17/2007 MR.  Several CTs between 06/21/2012 and 07/03/2012.  Findings: Remote infarct posterior right caudate/lenticular nucleus (2009).  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  The mass effect and hemorrhage has improved since the 07/03/2012 examination with resolution of midline shift to the right.  Currently, mild hyperdensity along the periphery of the left parietal - occipital lobe insult (series 2 images 18 through 20) may represent laminar necrosis although a small amount of acute hemorrhage cannot be entirely excluded.  Remote infarcts frontal lobes greater on the left.  Prominent small vessel disease type changes.  No large acute infarct separate from the above described findings. If further delineation is clinically desired MR imaging may prove helpful.  Baseline atrophy without hydrocephalus.  No intracranial mass lesions seen separate from the above described findings.  Lack of contrast limits evaluation for detection of small lesion.  Vascular calcifications.  IMPRESSION: Remote infarct posterior right caudate/lenticular nucleus (2009).  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  The mass effect and hemorrhage has improved since the 07/03/2012 examination with resolution of midline shift to the right.  Currently, mild hyperdensity along the periphery of the left parietal - occipital lobe insult (series 2 images 18 through 20) may represent laminar necrosis although a small amount of acute hemorrhage cannot  be entirely excluded.  Remote infarcts frontal lobes greater on the left.  Prominent small vessel disease type changes.  No large acute infarct separate from the above described findings. If further delineation is clinically desired MR imaging may prove helpful.  Baseline atrophy   Original Report Authenticated By: Lacy Duverney, M.D.   Mr Brain Wo Contrast  09/24/2012   *RADIOLOGY REPORT*  Clinical Data: History of stroke and hemorrhage.  Rule out acute infarct  MRI HEAD WITHOUT CONTRAST  Technique:  Multiplanar, multiecho pulse sequences of the brain and surrounding structures were obtained according to standard protocol without intravenous contrast.  Comparison: CT 09/24/2012, 07/03/2012.  MRI 05/17/2007  Findings: Left parietal hematoma measures 45 x 38 mm.  This contains hemosiderin and central methemoglobin.  This hematoma was present on 07/03/2012 and  appears to be slowly resolving.  There is also chronic hemorrhage in the left frontal lobe and right parietal lobe.  Extensive chronic ischemic change.  Chronic infarct in the pons bilaterally left greater than right.  Chronic infarct in the thalami bilaterally.  Chronic microvascular ischemic change in the white matter, right greater than left.  Small areas of  restricted diffusion in the deep white matter on the left, most consistent with acute infarct.  This measures under 1 cm.  Generalized atrophy.  Ventricles are mildly dilated, unchanged from prior studies.  No midline shift.  Paranasal sinuses are clear.  IMPRESSION: Probable small acute infarct in the deep white matter on the left.  Extensive chronic ischemic change.  Late subacute  hematoma left parietal lobe measures 45 x 38 mm.  There are areas of chronic hemorrhage bilaterally.   Original Report Authenticated By: Janeece Riggers, M.D.   Mr Tibia Fibula Right W Wo Contrast  09/18/2012   *RADIOLOGY REPORT*  Clinical Data:  Right lower leg mass.  MRI RIGHT LOWER EXTREMITY WITH AND WITHOUT CONTRAST   Technique:  Multiplanar, multisequence MR imaging of the right lower extremity was performed before and after the administration of intravenous contrast.  Contrast: 13mL MULTIHANCE GADOBENATE DIMEGLUMINE 529 MG/ML IV SOLN  Comparison:  None  Findings: The patients palpable abnormality corresponds to a small enhancing muscle lesion.  This measures 2.3 x 1.4 x 1.2 cm and is located in the peripheral aspect of the right lateral soleus muscle.  There is surrounding edema on the T2-weighted sequences in the muscle and the lesion itself is difficult to see on the T1 and T2 weighted images.  This could be a nerve sheath tumor, a primary muscle tumor or possibly a metastatic focus.  Biopsy is recommended.  Mild diffuse nonspecific myositis involving the right calf musculature.  The bony structures are unremarkable.  IMPRESSION:  1.  2 cm enhancing lesion in the lateral soleus muscle.  This could represent a nerve sheath tumor, primary muscle lesion or metastatic focus.  Recommend biopsy. 2.  Nonspecific myositis in the right lower extremity musculature. 3.  No significant bony findings.   Original Report Authenticated By: Rudie Meyer, M.D.   Dg Swallowing Func-speech Pathology  Admission HPI: Chief Complaint: Aphasia   History of Present Illness: 23 y o male with PMH of Rt cortical stroke- 2009- Left sided weakness, And LMCA stroke, became diffuse hrr stroke after TPA use -June 2014, CAD s/p 2 stent placement-2003, HTN, hyperlipidemia..  Presented with aphasia noticed this mornig when his wife came to visit him at the re-hab facitlity where he stays at about 6.30am this morning. But wife says prior to today he was able to understand and verbalize in few short phrases. She noticed this morning he was not able to talk at all, but later was able to talk but speech was not understandable.  Wife also says she noticed rt facial droop this morning, around his jaw, which she says later resolved. He had signif residual  deficit from his previous stroke in June, he has not been mobile, but has been able to move his right arm. He requires assistance with ADL. He has been getting physical therapy and speech therapy at the rehab facilitity. Family want him to go back to re-had facility.  Patient was worked up in 2009 for possible causes of his stroke- Factor 5 leiden- Negative, lupus anticoag- Normal, ESR-1, Pro-time- 11.8, INR- 0.9, prothrombin II gene mutation- Neg, ,prot S- WNl, low antithrombin  3 function, Homocysteine elev at 19.8, protein c atv- 177 elevated( 75-133).  He was brought to the ED by Ambulance, i-stat troponin ws negative, CMP- Na- 134, CBC within normal limits,APTt- wnl, PT- mildly subnormal--11.4, INR- 0.84, Head CT-Remote post infarct- 2009, Improvement in mass effect from prior stroke, with resolution of midline shift.   Physical Exam:  Blood pressure 190/81, pulse 65, temperature 98.5 F (36.9 C), temperature source Oral, resp. rate 16, height 5\' 9"  (1.753 m), weight 151 lb (68.493 kg), SpO2 99.00%.  .  GENERAL- alert, very emotional, crying ,trying to communicate, but speech is gabbled, appears as stated age,  HEENT- Atraumatic, normocephalic, PERRL, EOMI, oral mucosa appears moist, No carotid bruit, no cervical LN enlargement, thyroid does not appear enlarged.  CARDIAC- RRR, no murmurs, rubs or gallops.  RESP- Moving equal volumes of air, and clear to auscultation bilaterally.  ABDOMEN- Soft, nontender, PEG tube present, no palpable masses or organomegaly, bowel sounds present.  NEURO- No obvious facial droop, Cr N - 2-12 intact, Strenght reduced in both upper extremities- could not be assessed, as patient became emotional. Lower extr- strenght- 4/5 in Left, no movemnt on RT.  EXTREMITIES- pulse reduced, on lower extr.  SKIN- Warm, dry, No rash or lesion.  PSYCH- Emotional, affect- sad.   Hospital Course by problem list: Principal Problem:   CVA (cerebral infarction) Active Problems:    Dysphasia   Malignant hypertension   Microcytic anemia  CVA ( Cerebral infarction)- Patient presented with worsening dysarthria which persisited, and initial facial droop which had mostly resolved at presentation. Ct scan was done in the Ed, which showed No change from previous Ct, MRI was subsequently done- Probable small acute infarct in deep white matter on the Left, with chronic ischemic changes. Considering new onset of worsening dysartria, and resolved facial droop, a new CVA is most likely. Patient has had extensive work up for possible aetiologies of early onset of stroke in 2009. Patient was placed on NPO, EKG was done, showed some changes not concerning for ischemia,  Troponins X2 were obtained and Negative, CBC and BMP showed no concerning abnormalities, and were at baseline. Clonidine patch at 0.3mg  was continued to prevent rebound HTN, blood pressure was still markedly elevated at 208/106, and so was given Hydralazine- 50mg  Q6H, which was changed to PRN, to allow for permissive HTN, pt was admitted to tele, neurology was consulted, Speech therapist, Pt/OT was consulted. Also nutrition consult to determine type and amount of feeds per tube. Home meds Levitiracetam was continued on admission at 500mg  BID. While on admission pt had a run of Vent tachy which last about , but terminated spontaneously, BP was stable, and patient was conscious, no chest pain or SOB. Family did not want any more lab work drawn, or electrolytes checked, and wanted to get pt back to the rehab facility today. Neurology saw patient today, and recommendation- it was safe for patient to start aspirin therapy. Swallow evaluation not done before patient was discharged as the family was persistent about leaving today but patient was counselled on the risk of aspirating without swallow evaluation. Tube feeds therefore to be resumed at the SNF. Patient also counselled to hold BP meds till Thursday. Clonidine patch could be  continued, and PRN hydralazine for systolic BP over 161.  Malignant HTN- Blood pressure has been above normal as reported by family despite multiple medications and compliance. Home meds- hydralazine- 50mg  Q6H, isosorbide dinitrate 40mg  TID,lisionpril 40mg  daily, metoprolol 25mg  Q6H, clonidine patch 0.3mg /24hr. Blood  pressure has been labile from 139/69 to 208/106. All meds except clonidine were held on admit and will need to be restarted at the SNF.   DM2- Well controlled- HBA1c- 6.2, done 06/25/2102. His metformin was held on admit since the pt was NPO (no tube feeds) per stroke protocol. Metformin may be resumed once his tube feeds are resumed at the SNF.   Microcytic Anemia- HB and HCt currently stable. HB- 12.9. MCV- 72.6. FOBT, colonoscopy and iron studies not seen on patient chart. Further workup as an out-pt, guided by family's goals of care and patient's life expectancy.  Discharge Vitals:   BP 166/93  Pulse 57  Temp(Src) 97.3 F (36.3 C) (Oral)  Resp 18  Ht 5\' 9"  (1.753 m)  Wt 151 lb (68.493 kg)  BMI 22.29 kg/m2  SpO2 99%  Discharge Labs:  Results for orders placed during the hospital encounter of 09/24/12 (from the past 24 hour(s))  URINALYSIS, ROUTINE W REFLEX MICROSCOPIC     Status: Abnormal   Collection Time    09/24/12  1:01 PM      Result Value Range   Color, Urine YELLOW  YELLOW   APPearance CLEAR  CLEAR   Specific Gravity, Urine 1.017  1.005 - 1.030   pH 8.0  5.0 - 8.0   Glucose, UA NEGATIVE  NEGATIVE mg/dL   Hgb urine dipstick NEGATIVE  NEGATIVE   Bilirubin Urine NEGATIVE  NEGATIVE   Ketones, ur NEGATIVE  NEGATIVE mg/dL   Protein, ur 161 (*) NEGATIVE mg/dL   Urobilinogen, UA 1.0  0.0 - 1.0 mg/dL   Nitrite NEGATIVE  NEGATIVE   Leukocytes, UA TRACE (*) NEGATIVE  URINE MICROSCOPIC-ADD ON     Status: Abnormal   Collection Time    09/24/12  1:01 PM      Result Value Range   Squamous Epithelial / LPF RARE  RARE   WBC, UA 3-6  <3 WBC/hpf   RBC / HPF 0-2  <3  RBC/hpf   Bacteria, UA FEW (*) RARE   Urine-Other AMORPHOUS URATES/PHOSPHATES    URINE CULTURE     Status: None   Collection Time    09/24/12  1:06 PM      Result Value Range   Specimen Description URINE, RANDOM     Special Requests NONE     Culture  Setup Time       Value: 09/24/2012 14:35     Performed at [facility] Count       Value: NO GROWTH     Performed at [facility]   Culture       Value: NO GROWTH     Performed at [facility]   Report Status 09/25/2012 FINAL    GLUCOSE, CAPILLARY     Status: Abnormal   Collection Time    09/24/12  6:06 PM      Result Value Range   Glucose-Capillary 101 (*) 70 - 99 mg/dL   Comment 1 Documented in Chart    TROPONIN I     Status: None   Collection Time    09/24/12  8:13 PM      Result Value Range   Troponin I <0.30  <0.30 ng/mL  HEMOGLOBIN A1C     Status: Abnormal   Collection Time    09/24/12  8:18 PM      Result Value Range   Hemoglobin A1C 6.3 (*) <5.7 %   Mean Plasma Glucose 134 (*) <117 mg/dL  GLUCOSE, CAPILLARY     Status: None   Collection Time    09/24/12  8:26 PM      Result Value Range   Glucose-Capillary 97  70 - 99 mg/dL   Comment 1 Documented in Chart     Comment 2 Notify RN      Signed: Kennis Carina, MD 09/25/2012, 11:39 AM   Time Spent on Discharge: 35 minutes Services Ordered on Discharge: None Equipment Ordered on Discharge: None.

## 2012-09-25 NOTE — Progress Notes (Signed)
PT Cancellation Note  Patient Details Name: Kelvyn Schunk MRN: [id number] DOB: 12-26-1955   Cancelled Treatment:    Reason Eval/Treat Not Completed: Other (comment) Per RN, pt to d/c back to SNF today.   Cabella Kimm,KATHrine E 09/25/2012, 3:36 PM Zenovia Jarred, PT, DPT 09/25/2012 Pager: [phone]

## 2012-09-25 NOTE — Progress Notes (Signed)
Pt for discharge to [facility] SNF today. D/C instructions reviewed. Family at bedside to assist pt with discharge. [facility] brought pt downstairs via stretcher.

## 2012-11-12 ENCOUNTER — Other Ambulatory Visit (HOSPITAL_COMMUNITY): Payer: Self-pay | Admitting: Geriatric Medicine

## 2012-11-12 DIAGNOSIS — R1310 Dysphagia, unspecified: Secondary | ICD-10-CM

## 2012-11-14 ENCOUNTER — Ambulatory Visit (HOSPITAL_COMMUNITY)
Admission: RE | Admit: 2012-11-14 | Discharge: 2012-11-14 | Disposition: A | Discharge status: Home / Self Care | Payer: Medicare Other | Source: Ambulatory Visit | Attending: Geriatric Medicine | Admitting: Geriatric Medicine

## 2012-11-14 DIAGNOSIS — R1310 Dysphagia, unspecified: Secondary | ICD-10-CM | POA: Insufficient documentation

## 2012-11-14 DIAGNOSIS — I10 Essential (primary) hypertension: Secondary | ICD-10-CM | POA: Insufficient documentation

## 2012-11-14 DIAGNOSIS — Z8673 Personal history of transient ischemic attack (TIA), and cerebral infarction without residual deficits: Secondary | ICD-10-CM | POA: Insufficient documentation

## 2012-11-14 NOTE — Procedures (Signed)
Objective Swallowing Evaluation: Modified Barium Swallowing Study  Patient Details  Name: [name] MRN: [id number] Date of Birth: 19-Feb-1955  Today's Date: 11/14/2012 Time: 2725-3664 SLP Time Calculation (min): 28 min  Past Medical History:  Past Medical History  Diagnosis Date  . Stroke 2007  . Hypertension   . Difficult airway for intubation 06/22/2012    Deep and anterior. Difficult with standard laryngoscope. Easily intubated with Glidescope   . Coronary artery disease     Stent placement   Past Surgical History:  Past Surgical History  Procedure Laterality Date  . Coronary stent placement    . Cardiac surgery     HPI:  57 yr old seen for outpatient MBS for possible diet/liquid upgrade.  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 exhibited moderate oral dysphagia characterized by decreased labial seal, decreased oral cohesion, manipulation and transit.  Moderate pharyngeal dysphagia is described as sensorimotor impairments with decreased sensation leading to delayed swallow initiation.  Laryngeal elevation and laryngeal closure reduced with penetration to vocal cords without sensation.  Mastication with cracker was delayed.  Recommend continue with mechanical soft diet and nectar thick liquids.  SLP can observe with a trial tray of regular texture for possible upgrade if/when appropriate.      Treatment Recommendation  Defer treatment plan to SLP at (Comment)    Diet Recommendation Dysphagia 3 (Mechanical Soft);Nectar-thick liquid   Liquid Administration  via: Cup;No straw Medication Administration: Crushed with puree Supervision: Patient able to self feed;Full supervision/cueing for compensatory strategies Compensations: Slow rate;Small sips/bites;Multiple dry swallows after each bite/sip Postural Changes and/or Swallow Maneuvers: Seated upright 90 degrees    Other  Recommendations Oral Care Recommendations: Oral care BID   Follow Up Recommendations  Skilled Nursing facility    Frequency and Duration        Pertinent Vitals/Pain n/a          Reason for Referral Objectively evaluate swallowing function   Oral Phase Oral Preparation/Oral Phase Oral Phase: Impaired Oral - Nectar Oral - Nectar Teaspoon: Weak lingual manipulation;Reduced posterior propulsion;Left pocketing in lateral sulci;Right pocketing in lateral sulci;Right anterior bolus loss Oral - Nectar Cup: Weak lingual manipulation;Reduced posterior propulsion;Left pocketing in lateral sulci;Right pocketing in lateral sulci;Right anterior bolus loss Oral - Thin Oral - Thin Teaspoon: Weak lingual manipulation;Reduced posterior propulsion;Left pocketing in lateral sulci;Right pocketing in lateral sulci;Right anterior bolus loss Oral - Thin Cup: Weak lingual manipulation;Reduced posterior propulsion;Left pocketing in lateral sulci;Right pocketing in lateral sulci;Right anterior bolus loss Oral - Solids Oral - Puree: Weak lingual manipulation;Delayed oral transit Oral - Regular: Delayed oral transit;Impaired mastication   Pharyngeal Phase Pharyngeal Phase Pharyngeal Phase: Impaired Pharyngeal - Nectar Pharyngeal - Nectar Teaspoon: Pharyngeal residue - pyriform sinuses;Pharyngeal residue - valleculae;Reduced tongue base retraction;Reduced laryngeal elevation;Delayed swallow initiation;Premature spillage to valleculae;Premature spillage to pyriform sinuses Pharyngeal - Nectar Cup: Pharyngeal residue - valleculae;Pharyngeal residue - pyriform sinuses;Reduced laryngeal  elevation;Premature spillage to valleculae;Premature spillage to pyriform sinuses;Reduced tongue base retraction Pharyngeal - Thin Pharyngeal - Thin Teaspoon: Delayed swallow initiation;Premature spillage to pyriform sinuses;Pharyngeal residue - valleculae;Pharyngeal residue - pyriform sinuses;Reduced laryngeal elevation;Reduced tongue base retraction;Penetration/Aspiration during swallow Penetration/Aspiration details (thin teaspoon): Material enters  airway, remains ABOVE vocal cords and not ejected out;Material enters airway, remains ABOVE vocal cords then ejected out Pharyngeal - Thin Cup: Delayed swallow initiation;Premature spillage to pyriform sinuses;Reduced laryngeal elevation;Reduced airway/laryngeal closure;Pharyngeal residue - valleculae;Pharyngeal residue - pyriform sinuses;Reduced tongue base retraction;Penetration/Aspiration during swallow Penetration/Aspiration details (thin cup): Material enters airway, CONTACTS cords and not ejected out;Material enters airway, remains ABOVE vocal cords and not ejected out Pharyngeal - Solids Pharyngeal - Puree: Pharyngeal residue - pyriform sinuses;Pharyngeal residue - valleculae;Reduced tongue base retraction;Reduced laryngeal elevation;Delayed swallow initiation;Premature spillage to valleculae Pharyngeal - Regular: Pharyngeal residue - valleculae;Pharyngeal residue - pyriform sinuses;Reduced tongue base retraction;Reduced laryngeal elevation  Cervical Esophageal Phase    GO    Cervical Esophageal Phase Cervical Esophageal Phase: [facility]    Functional Assessment Tool Used:  (clinical judgement) Functional Limitations: Swallowing Swallow Current Status ([id number]): At least 40 percent but less than 60 percent impaired, limited or restricted Swallow Goal Status [phone]): At least 40 percent but less than 60 percent impaired, limited or restricted Swallow Discharge Status [phone]): At least 40 percent but less than 60 percent impaired, limited or  restricted    Royce Macadamia M.Ed [employer] [phone]  11/14/2012

## 2012-12-04 ENCOUNTER — Non-Acute Institutional Stay (SKILLED_NURSING_FACILITY): Payer: Medicare Other | Admitting: Internal Medicine

## 2012-12-04 DIAGNOSIS — I10 Essential (primary) hypertension: Secondary | ICD-10-CM

## 2012-12-04 DIAGNOSIS — I69959 Hemiplegia and hemiparesis following unspecified cerebrovascular disease affecting unspecified side: Secondary | ICD-10-CM

## 2012-12-12 LAB — LIPID PANEL
Cholesterol: 133 mg/dL (ref 0–200)
HDL: 46 mg/dL (ref 35–70)
LDL Cholesterol: 71 mg/dL
Triglycerides: 81 mg/dL (ref 40–160)

## 2012-12-14 ENCOUNTER — Encounter: Payer: Self-pay | Admitting: Adult Health

## 2012-12-14 ENCOUNTER — Non-Acute Institutional Stay (SKILLED_NURSING_FACILITY): Payer: Medicare Other | Admitting: Adult Health

## 2012-12-14 DIAGNOSIS — I639 Cerebral infarction, unspecified: Secondary | ICD-10-CM

## 2012-12-14 DIAGNOSIS — K3184 Gastroparesis: Secondary | ICD-10-CM | POA: Insufficient documentation

## 2012-12-14 DIAGNOSIS — I10 Essential (primary) hypertension: Secondary | ICD-10-CM

## 2012-12-14 DIAGNOSIS — E785 Hyperlipidemia, unspecified: Secondary | ICD-10-CM | POA: Insufficient documentation

## 2012-12-14 DIAGNOSIS — F329 Major depressive disorder, single episode, unspecified: Secondary | ICD-10-CM | POA: Insufficient documentation

## 2012-12-14 DIAGNOSIS — I6350 Cerebral infarction due to unspecified occlusion or stenosis of unspecified cerebral artery: Secondary | ICD-10-CM

## 2012-12-14 DIAGNOSIS — R569 Unspecified convulsions: Secondary | ICD-10-CM

## 2012-12-14 MED ORDER — LOSARTAN POTASSIUM 50 MG PO TABS: 50.0000 mg | ORAL_TABLET | Freq: Every day | ORAL | Status: DC

## 2012-12-14 MED ORDER — SERTRALINE HCL 100 MG PO TABS: 50.0000 mg | ORAL_TABLET | Freq: Every day | ORAL | Status: DC

## 2012-12-14 NOTE — Progress Notes (Signed)
Patient ID: [name], male   DOB: 15-Oct-1955, 57 y.o.   MRN: [id number]     [facility]  No Known Allergies   Chief Complaint  Patient presents with  . Acute Visit    weight loss and hypertension     HPI: He has lost 5 pounds since his admission to this facility. The staff has started him on magic cups. His family as started bringing him in fod from home and report that he has just started eating over the past couple days. He has been tearful today; his family thinks he is angry about having bolsters on his bed. It is difficult to for his to participate in the hpi and ros.   Past Medical History  Diagnosis Date  . Stroke 2007  . Hypertension   . Difficult airway for intubation 06/22/2012    Deep and anterior. Difficult with standard laryngoscope. Easily intubated with Glidescope   . Coronary artery disease     Stent placement    Past Surgical History  Procedure Laterality Date  . Coronary stent placement    . Cardiac surgery      VITAL SIGNS BP 169/93  Pulse 51  Ht 5\' 10"  (1.778 m)  Wt 144 lb (65.318 kg)  BMI 20.66 kg/m2   Patient's Medications  New Prescriptions   No medications on file  Previous Medications   ACETAMINOPHEN (TYLENOL) 325 MG TABLET    Give 650 mg by tube every 6 (six) hours as needed (elevated temperature).   ATORVASTATIN (LIPITOR) 10 MG TABLET    Take 10 mg by mouth daily.   CLONAZEPAM (KLONOPIN) 0.5 MG TABLET    Take 0.5 mg by mouth every 12 (twelve) hours.   CLONIDINE (CATAPRES - DOSED IN MG/24 HR) 0.3 MG/24HR PATCH    Place 1 patch onto the skin once a week.   ERYTHROMYCIN ETHYLSUCCINATE (EES) 200 MG/5ML SUSPENSION    Take 250 mg by mouth every 6 (six) hours.   HALOPERIDOL (HALDOL) 2 MG TABLET    Take 0.5 mg by mouth 2 (two) times daily.    LEVETIRACETAM (KEPPRA) 500 MG TABLET    Take 500 mg by mouth every 12 (twelve) hours.   LISINOPRIL (PRINIVIL,ZESTRIL) 40 MG TABLET    Take 40 mg by mouth daily.   METFORMIN (GLUCOPHAGE) 500 MG TABLET    Take  500 mg by mouth 2 (two) times daily with a meal.   METOPROLOL TARTRATE (LOPRESSOR) 25 MG TABLET    Take 1 tablet (25 mg total) by mouth every 6 (six) hours.   OXYCODONE (OXY IR/ROXICODONE) 5 MG IMMEDIATE RELEASE TABLET    5 mg by PEG Tube route every 6 (six) hours as needed.    POLYETHYLENE GLYCOL (MIRALAX / GLYCOLAX) PACKET    Take 17 g by mouth 2 (two) times daily.   POLYVINYL ALCOHOL (LIQUIFILM TEARS) 1.4 % OPHTHALMIC SOLUTION    Place 1 drop into both eyes 2 (two) times daily.   SERTRALINE (ZOLOFT) 50 MG TABLET    Take 50 mg by mouth daily.   WATER FOR IRRIGATION, STERILE (STERILE WATER FOR IRRIGATION)    Irrigate with 100 mLs as directed every 8 (eight) hours.   Modified Medications   Modified Medication Previous Medication   HYDRALAZINE (APRESOLINE) 50 MG TABLET hydrALAZINE (APRESOLINE) 50 MG tablet      Take 50 mg by mouth every 6 (six) hours. Also 50 mg twice daily for b/p>160/90 as needed    Take 1 tablet (50 mg total) by  mouth every 6 (six) hours.   ISOSORBIDE DINITRATE (ISORDIL) 40 MG TABLET isosorbide dinitrate (ISORDIL) 40 MG tablet      Take 80 mg by mouth 3 (three) times daily.    Take 1 tablet (40 mg total) by mouth 3 (three) times daily.  Discontinued Medications   ASPIRIN EC 81 MG TABLET    Take 1 tablet (81 mg total) by mouth daily.   FEEDING SUPPLEMENT (PRO-STAT SUGAR FREE 64) LIQD    Take 30 mLs by mouth daily.   LISINOPRIL (PRINIVIL,ZESTRIL) 40 MG TABLET    Take 1 tablet (40 mg total) by mouth daily.   METFORMIN (GLUCOPHAGE-XR) 500 MG 24 HR TABLET    Take 1,000 mg by mouth daily with breakfast.   NUTRITIONAL SUPPLEMENTS (FEEDING SUPPLEMENT, JEVITY 1.2 CAL,) LIQD    Place 1,000 mLs into feeding tube daily. 90 ml /hr off at 8am start at 2 pm   OXYCODONE (OXY IR/ROXICODONE) 5 MG IMMEDIATE RELEASE TABLET    Take 5 mg by mouth every 8 (eight) hours.    SIGNIFICANT DIAGNOSTIC EXAMS    LABS REVIEWED:   09-24-12: wbc 8.5; hgb 12.9; hct 39.2; mcv 72.6; plt 228; glucose 106;  bun 28; creat 0.83; k+4.3; na++143; liver normal albumin 3.4; hgb a1c 6.3  12-12-12: wbc 4.9; hgb 12.5; hct 37.0; mcv 71.6; plt 162; glucose 98; bun 17; creat 1.12; k+4.0; na++145; chol 133; ldl 71; trig 81; hgb a1c 6.3   Review of Systems  Unable to perform ROS   Physical Exam  Constitutional: No distress.  thin  Neck: Neck supple. No JVD present.  Cardiovascular: Normal rate, regular rhythm and intact distal pulses.   Respiratory: Effort normal and breath sounds normal. No respiratory distress. He has no wheezes.  GI: Soft. Bowel sounds are normal. He exhibits no distension. There is no tenderness.  Peg tube present  Musculoskeletal: He exhibits no edema.  Has right hemiparesis  Neurological: He is alert.  Skin: Skin is warm and dry. He is not diaphoretic.  Psychiatric:  Is crying today      ASSESSMENT/ PLAN:  1. Hypertension: his blood pressure is not well controlled; will continue hydralazine 50 mg every 6 hours with an additional twice daily dosing as needed for an elevated reading; will continue clonidine 0.3 mg patch; lisinopril 40 mg daily; will continue lopressor 25 mg every 6 hours; will continue isordil 80 mg every 8 hours. Will being cozaar 50 mg daily will have nursing continue to check blood pressure and will check bmp in 2 weeks.   2. Depression: due to his crying; will increase his zoloft to 100 mg daily. There are no reports of psychosis present; will lower the haldol to 0.5 mg nightly for one week then stop; will monitor how well he tolerates this medication changes.  Per his family; he has not eaten well for the past 3 weeks; but has just started to eat over the past couple of days. They are aware that we may have to utilize his peg tube if he continues to lose weight. The goal by increasing his zoloft and weaning off the haldol is to increase his appetite by better treating his depression.   3. Seizure; no reports of seizure activity present; will continue keppra 500  mg twice daily. Will continue klonopin 0.5 mg twice daily and will monitor his status.   4. Diabetes: will continue metformin 500 mg twice daily   5. Gastroparesis: will continue EES 250 mg every 6 hours and  will continue his water flushes every shift will monitor   6. Dyslipidemia: will continue lipitor 10 mg daily

## 2012-12-24 ENCOUNTER — Non-Acute Institutional Stay (SKILLED_NURSING_FACILITY): Payer: Medicare Other | Admitting: Nurse Practitioner

## 2012-12-24 DIAGNOSIS — R4702 Dysphasia: Secondary | ICD-10-CM

## 2012-12-24 DIAGNOSIS — F329 Major depressive disorder, single episode, unspecified: Secondary | ICD-10-CM

## 2012-12-24 DIAGNOSIS — E1142 Type 2 diabetes mellitus with diabetic polyneuropathy: Secondary | ICD-10-CM

## 2012-12-24 DIAGNOSIS — I6990 Unspecified sequelae of unspecified cerebrovascular disease: Secondary | ICD-10-CM

## 2012-12-24 DIAGNOSIS — R4789 Other speech disturbances: Secondary | ICD-10-CM

## 2012-12-24 DIAGNOSIS — E1149 Type 2 diabetes mellitus with other diabetic neurological complication: Secondary | ICD-10-CM | POA: Insufficient documentation

## 2012-12-24 DIAGNOSIS — I10 Essential (primary) hypertension: Secondary | ICD-10-CM

## 2012-12-24 DIAGNOSIS — E785 Hyperlipidemia, unspecified: Secondary | ICD-10-CM

## 2012-12-24 NOTE — Progress Notes (Signed)
Patient ID: [name], male   DOB: 05-29-1955, 57 y.o.   MRN: [id number]    Nursing Home Location:  [facility]   Place of Service: SNF (31)  PCP: Erlinda Hong, MD  No Known Allergies  Chief Complaint  Patient presents with  . Medical Managment of Chronic Issues    HPI:  57 year old man with a pmh of multiple CVAs, CAD, hypertension who is being seen today for follow up on chronic conditions; pt does not contribute to HPI or ROS; nursing is without concerns at this time.   Review of Systems:  Unable to obtain  Past Medical History  Diagnosis Date  . Stroke 2007  . Hypertension   . Difficult airway for intubation 06/22/2012    Deep and anterior. Difficult with standard laryngoscope. Easily intubated with Glidescope   . Coronary artery disease     Stent placement  . Malignant hypertension 07/14/2012  . 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 MSSA (methicillin susceptible Staphylococcus aureus) pneumonia 07/03/2012  . CVA (cerebral infarction) 06/22/2012  . Seizure, possible 06/22/2012   Past Surgical History  Procedure Laterality Date  . Coronary stent placement    . Cardiac surgery     Social History:   reports that he has never smoked. He has never used smokeless tobacco. He reports that he does not drink alcohol. His drug history is not on file.  Family History  Problem Relation Age of Onset  . Hypertension Mother   . Hypertension Father     Medications: Patient's Medications  New Prescriptions   No medications on file  Previous Medications   ACETAMINOPHEN (TYLENOL) 325 MG TABLET    Give 650 mg by tube every 6 (six) hours as needed (elevated temperature).   ATORVASTATIN (LIPITOR) 10 MG TABLET    Take 10 mg by mouth daily.   CLONAZEPAM (KLONOPIN) 0.5 MG TABLET    Take 0.5 mg by mouth every 12 (twelve) hours.   CLONIDINE (CATAPRES - DOSED IN MG/24 HR) 0.3 MG/24HR PATCH    Place 1 patch onto the skin once a week.   ERYTHROMYCIN  ETHYLSUCCINATE (EES) 200 MG/5ML SUSPENSION    Take 250 mg by mouth every 6 (six) hours.   HYDRALAZINE (APRESOLINE) 50 MG TABLET    Take 50 mg by mouth every 6 (six) hours. Also 50 mg twice daily for b/p>160/90 as needed   ISOSORBIDE DINITRATE (ISORDIL) 40 MG TABLET    Take 80 mg by mouth 3 (three) times daily.   LEVETIRACETAM (KEPPRA) 500 MG TABLET    Take 500 mg by mouth every 12 (twelve) hours.   LISINOPRIL (PRINIVIL,ZESTRIL) 40 MG TABLET    Take 40 mg by mouth daily.   LOSARTAN (COZAAR) 50 MG TABLET    Take 1 tablet (50 mg total) by mouth daily.   METFORMIN (GLUCOPHAGE) 500 MG TABLET    Take 500 mg by mouth 2 (two) times daily with a meal.   METOPROLOL TARTRATE (LOPRESSOR) 25 MG TABLET    Take 1 tablet (25 mg total) by mouth every 6 (six) hours.   OXYCODONE (OXY IR/ROXICODONE) 5 MG IMMEDIATE RELEASE TABLET    5 mg by PEG Tube route every 6 (six) hours as needed.    POLYETHYLENE GLYCOL (MIRALAX / GLYCOLAX) PACKET    Take 17 g by mouth 2 (two) times daily.   POLYVINYL ALCOHOL (LIQUIFILM TEARS) 1.4 % OPHTHALMIC SOLUTION    Place 1 drop into both eyes 2 (two) times daily.  SERTRALINE (ZOLOFT) 100 MG TABLET    Take 0.5 tablets (50 mg total) by mouth daily.   WATER FOR IRRIGATION, STERILE (STERILE WATER FOR IRRIGATION)    Irrigate with 100 mLs as directed every 8 (eight) hours.   Modified Medications   No medications on file  Discontinued Medications   No medications on file     Physical Exam: Physical Exam  Constitutional: He is well-developed, well-nourished, and in no distress.  HENT:  Mouth/Throat: Oropharynx is clear and moist. No oropharyngeal exudate.  Neck: Normal range of motion. Neck supple.  Cardiovascular: Normal rate, regular rhythm and normal heart sounds.   Pulmonary/Chest: Effort normal and breath sounds normal. No respiratory distress.  Abdominal: Soft. Bowel sounds are normal.  Musculoskeletal: He exhibits no edema and no tenderness.  Right sided weakness  Neurological:  He is alert.  Skin: Skin is warm and dry. No erythema.    Filed Vitals:   12/24/12 1449  BP: 135/75  Pulse: 52  Temp: 97.5 F (36.4 C)  Resp: 20      Labs reviewed: Basic Metabolic Panel:  Recent Labs  16/10/96 0500  07/05/12 0500 07/06/12 0545  07/11/12 1622  07/16/12 0500 07/17/12 0545 09/24/12 0930  NA 158*  < > 157* 152*  < > 171*  < > 151* 148* 134*  K 3.9  < > 4.1 4.4  < > 4.3  < > 4.2 3.3* 4.3  CL 120*  < > 119* 114*  < > >130*  < > 116* 113* 97  CO2 27  < > 28 27  < > 23  < > 25 24 25   GLUCOSE 220*  < > 209* 287*  < > 410*  < > 231* 119* 106*  BUN 48*  < > 55* 61*  < > 89*  < > 43* 36* 28*  CREATININE 1.88*  < > 1.95* 2.01*  < > 2.48*  < > 1.57* 1.50* 0.83  CALCIUM 8.8  < > 9.0 8.7  < > 8.4  < > 8.2* 8.2* 9.6  MG 2.5  --  2.5 2.6*  --  3.0*  --   --   --   --   PHOS 3.8  --  4.1 4.6  --   --   --   --   --   --   < > = values in this interval not displayed. Liver Function Tests:  Recent Labs  06/24/12 0410 07/08/12 1412 09/24/12 0930  AST 41* 338* 26  ALT 20 138* 28  ALKPHOS 47 135* 92  BILITOT 0.5 0.4 0.3  PROT 6.5 8.5* 7.8  ALBUMIN 2.6* 2.3* 3.4*   No results found for this basename: LIPASE, AMYLASE,  in the last 8760 hours No results found for this basename: AMMONIA,  in the last 8760 hours CBC:  Recent Labs  06/29/12 0500  07/08/12 1412  07/16/12 0500 07/17/12 0545 09/24/12 0930  WBC 16.3*  < > 20.8*  < > 14.3* 13.1* 8.5  NEUTROABS 13.7*  --  18.3*  --   --   --  5.7  HGB 13.1  < > 13.4  < > 12.0* 12.2* 12.9*  HCT 42.7  < > 45.2  < > 40.9 41.6 39.2  MCV 64.0*  < > 68.5*  < > 70.0* 69.6* 72.6*  PLT 249  < > 323  < > 114* 132* 228  < > = values in this interval not displayed. Cardiac Enzymes:  Recent Labs  06/22/12 2216 09/24/12 0930 09/24/12 2013  TROPONINI 10.83* <0.30 <0.30   BNP: No components found with this basename: POCBNP,  CBG:  Recent Labs  07/17/12 1652 09/24/12 1806 09/24/12 2026  GLUCAP 120* 101* 97    TSH: No results found for this basename: TSH,  in the last 8760 hours A1C: Lab Results  Component Value Date   HGBA1C 6.3* 09/24/2012   Lipid Panel:  Recent Labs  06/22/12 0525 06/25/12 0356 06/28/12 0600  CHOL 185  --   --   HDL 58  --   --   LDLCALC 161*  --   --   TRIG 92 128 294*  CHOLHDL 3.2  --   --    LABS REVIEWED:  09-24-12: wbc 8.5; hgb 12.9; hct 39.2; mcv 72.6; plt 228; glucose 106; bun 28; creat 0.83; k+4.3; na++143; liver normal albumin 3.4; hgb a1c 6.3  12-12-12: wbc 4.9; hgb 12.5; hct 37.0; mcv 71.6; plt 162; glucose 98; bun 17; creat 1.12; k+4.0; na++145; chol 133; ldl 71; trig 81; hgb a1c 6.3    Assessment/Plan 1. Malignant hypertension -improved with cozaar; will cont to monitor  2. Depression with mood disorder -stable; no changes in symptoms at this time -was started on depakote BID by psych  -pt conts to eat well if family brings food  3. Dysphasia -puree diet  -stable at this time  4. Late effects of CVA (cerebrovascular accident) -with dysphsia; pt currently on ASA daily  5. Dyslipidemia -stable; conts statin  6. Diabetes mellitus with neurological manifestations, controlled -diabetes well controlled on metformin, A1c at goal

## 2012-12-25 NOTE — Progress Notes (Addendum)
Patient ID: [name], male   DOB: [birth year], 57 y.o.   MRN: [id number]           HISTORY & PHYSICAL  DATE:  12/04/2012    FACILITY: [facility]    LEVEL OF CARE:   SNF   CHIEF COMPLAINT:  This is an admission to SNF, post transfer from the nursing home part of the [facility], I believe.    HISTORY OF PRESENT ILLNESS:  I am only putting this together with bits and pieces of information.    This is a 57 year-old man who had a right cortical stroke in 2009 with left-sided weakness, and a left middle cerebral artery stroke at some time after that.  He was  apparently in a rehab facility until September when he was noted to be much less verbally fluent.  There was also noted right-sided facial droop.  CT scan of the head showed no change from previous.  An MRI was done that showed a small acute infarct in the white matter on the left with chronic ischemic changes.  He also had a run of ventricular tachycardia.  Finally, he had malignant hypertension.    I think he was sent back to the [facility], but has been transferred here for ongoing care.    An MRI of the head on 09/24/2012 showed a left parietal hematoma measuring 45 x 38 mm.  This hematoma was also present on July 03, 2012.  There was also chronic hemorrhage in the left frontal lobe and right parietal lobe.  He has extensive chronic ischemic change including a chronic infarct in the pons bilaterally, left greater than right, a chronic infarct in the thalamus bilaterally, and chronic microvascular ischemic change in the white matter, right greater than left.  The overall impression was that there was a possible small acute infarct in the deep white matter on the left with extensive chronic ischemic change, subacute hematoma on the left parietal lobe.    PAST MEDICAL HISTORY/PROBLEM LIST:  Stroke.    Accelerated/malignant hypertension.    Required intubation in June 2014.    Coronary artery disease.  Status post stent placement.     CURRENT MEDICATIONS:  Medication list is reviewed.    Hydralazine 50 mg b.i.d.    Oxycodone 5 mg q.6 p.r.n.    MiraLAX 17 g b.i.d.    Sertraline 50 mg daily.    Lipitor 10 mg daily.    Clonazepam 0.5 mg, 1 tablet every 12 hours.    Allopurinol 1 mg p.o. b.i.d.    Metoprolol 50 mg q.6.    Metformin 500 b.i.d.    Clonidine 0.3 mg patch weekly.    ASA 81 q.d.    Lisinopril 40 q.d.    Keppra 500 mg q.12.    Metoprolol 25 mg q.6.    Imdur 40 mg q.8.    SOCIAL HISTORY:  I really have no information other than the patient seems to have come from the skilled unit of the [facility].  Exact circumstances here are unclear.    FAMILY HISTORY:  Not available from any current source.    REVIEW OF SYSTEMS:  Really not possible from the patient.  However, he has severe neurologic damage, according to therapy.  He is not able to stand.  Cannot consistently feed himself.  Does not seem to have any swallowing issues.  He does have a PEG tube that we are not using.    PHYSICAL EXAMINATION:   VITAL SIGNS:  O2 SATURATIONS:  96% on room air.   PULSE:  77.   RESPIRATIONS:  18.   GENERAL APPEARANCE:  Very disabled, 57 year-old man, but in no distress.   His speech is dysarthric.   HEENT:   MOUTH/THROAT:   Oral exam is normal.   CHEST/RESPIRATORY:  Clear air entry bilaterally.   CARDIOVASCULAR:  CARDIAC:   Heart sounds are normal.  There are no murmurs.  No gallops.   GASTROINTESTINAL:  LIVER/SPLEEN/KIDNEYS:  No liver, no spleen.   ABDOMEN:  No masses.  PEG site looks fine.   GENITOURINARY:  BLADDER:   Not distended.  There is no CVA tenderness.   NEUROLOGICAL:   This man is very badly damaged with bihemispheric findings.   CRANIAL NERVES:  He does not have conjugate gaze in any direction.  His eyes seem fixed forward.   SENSATION/STRENGTH:  He has hemiparesis on the right side.  He appears to have some use of his left hand, left arm and left leg, although he has increased tone  here.  He has markedly increased nuchal rigidity.   DEEP TENDON REFLEXES:  Reflexes seem symmetric.  His toes are downgoing.  He does not have a Hoffmann's reflex, although he does have primitive reflexes.   OTHER:  Speech is very dysarthric.  He seems to have some comprehension, but his verbal output is very difficult to understand.    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  The exact sequence of his problems is really not clear to me at this time.    Type 2 diabetes, although that was not listed on his problem list at [facility] in June and September as far as I can tell.   He is on metformin.    History of malignant hypertension.  He is on a large number of blood pressure medications.    Depression.  He is on Zoloft and Haldol.  The reason for the Haldol is not clear, although he  apparently was resistive with care at one point.  My concern about the Haldol is marked stiffness, especially in the right side.    Hyperlipidemia.  On Lipitor.    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  He is from [place].  His second stroke is the current one, although I think that was probably in June and he may have had a subsequent event in September.    The need for the Haldol is not clear and I would like to begin a very gradual withdrawal of this medication to see if he becomes less rigid.    I would like to verify the dose of the metoprolol.  He is currently on 25 q.6, although I think he might have been on 75 q.6 previously.

## 2013-01-14 ENCOUNTER — Non-Acute Institutional Stay (SKILLED_NURSING_FACILITY): Payer: PRIVATE HEALTH INSURANCE | Admitting: Nurse Practitioner

## 2013-01-14 DIAGNOSIS — I6990 Unspecified sequelae of unspecified cerebrovascular disease: Secondary | ICD-10-CM

## 2013-01-14 DIAGNOSIS — E1149 Type 2 diabetes mellitus with other diabetic neurological complication: Secondary | ICD-10-CM

## 2013-01-14 DIAGNOSIS — F3289 Other specified depressive episodes: Secondary | ICD-10-CM

## 2013-01-14 DIAGNOSIS — F329 Major depressive disorder, single episode, unspecified: Secondary | ICD-10-CM

## 2013-01-14 DIAGNOSIS — E1142 Type 2 diabetes mellitus with diabetic polyneuropathy: Secondary | ICD-10-CM

## 2013-01-14 DIAGNOSIS — F32A Depression, unspecified: Secondary | ICD-10-CM

## 2013-01-14 DIAGNOSIS — I10 Essential (primary) hypertension: Secondary | ICD-10-CM

## 2013-01-14 NOTE — Progress Notes (Signed)
Patient ID: [name], male   DOB: 18-Jun-1955, 58 y.o.   MRN: [id number]    Nursing Home Location:  [facility]   Place of Service: SNF (31)  PCP: Erlinda Hong, MD  No Known Allergies  Chief Complaint  Patient presents with  . Medical Managment of Chronic Issues    HPI:  58 year old man with a pmh of multiple CVAs, CAD, hypertension who is being seen today for follow up on chronic conditions; pt remains with high blood pressures despite multiple medications; has PRN hydralazine, staff reports pt has had increase in appetite, mood has been better and nursing does not have concerns at this time.    Review of Systems:  Review of Systems  Constitutional: Negative for fever, chills and malaise/fatigue.  HENT: Negative for congestion and sore throat.   Respiratory: Negative for cough and shortness of breath.   Cardiovascular: Negative for chest pain and leg swelling.  Gastrointestinal: Negative for heartburn, abdominal pain, diarrhea and constipation.  Genitourinary: Negative for dysuria.  Musculoskeletal: Negative for myalgias.  Skin: Negative.   Neurological: Positive for weakness. Negative for dizziness and headaches.  Psychiatric/Behavioral: Negative for depression. The patient is not nervous/anxious.      Past Medical History  Diagnosis Date  . Stroke 2007  . Hypertension   . Difficult airway for intubation 06/22/2012    Deep and anterior. Difficult with standard laryngoscope. Easily intubated with Glidescope   . Coronary artery disease     Stent placement  . Malignant hypertension 07/14/2012  . 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 MSSA (methicillin susceptible Staphylococcus aureus) pneumonia 07/03/2012  . CVA (cerebral infarction) 06/22/2012  . Seizure, possible 06/22/2012   Past Surgical History  Procedure Laterality Date  . Coronary stent placement    . Cardiac surgery     Social History:   reports that he has never smoked. He has  never used smokeless tobacco. He reports that he does not drink alcohol. His drug history is not on file.  Family History  Problem Relation Age of Onset  . Hypertension Mother   . Hypertension Father     Medications: Patient's Medications  New Prescriptions   No medications on file  Previous Medications   ACETAMINOPHEN (TYLENOL) 325 MG TABLET    Give 650 mg by tube every 6 (six) hours as needed (elevated temperature).   ASPIRIN 81 MG CHEWABLE TABLET    Chew 81 mg by mouth daily.   ATORVASTATIN (LIPITOR) 10 MG TABLET    Take 10 mg by mouth daily.   CLONAZEPAM (KLONOPIN) 0.5 MG TABLET    Take 0.5 mg by mouth every 12 (twelve) hours.   CLONIDINE (CATAPRES - DOSED IN MG/24 HR) 0.3 MG/24HR PATCH    Place 1 patch onto the skin once a week.   ERYTHROMYCIN ETHYLSUCCINATE (EES) 200 MG/5ML SUSPENSION    Take 250 mg by mouth every 6 (six) hours.   HYDRALAZINE (APRESOLINE) 50 MG TABLET    Take 50 mg by mouth every 6 (six) hours. Also 50 mg twice daily for b/p>160/90 as needed   ISOSORBIDE DINITRATE (ISORDIL) 40 MG TABLET    Take 80 mg by mouth 3 (three) times daily.   LEVETIRACETAM (KEPPRA) 500 MG TABLET    Take 500 mg by mouth every 12 (twelve) hours.   LOSARTAN (COZAAR) 50 MG TABLET    Take 1 tablet (50 mg total) by mouth daily.   METFORMIN (GLUCOPHAGE) 500 MG TABLET    Take 500  mg by mouth 2 (two) times daily with a meal.   METOPROLOL TARTRATE (LOPRESSOR) 25 MG TABLET    Take 1 tablet (25 mg total) by mouth every 6 (six) hours.   OXYCODONE (OXY IR/ROXICODONE) 5 MG IMMEDIATE RELEASE TABLET    5 mg by PEG Tube route every 6 (six) hours as needed.    POLYETHYLENE GLYCOL (MIRALAX / GLYCOLAX) PACKET    Take 17 g by mouth 2 (two) times daily.   POLYVINYL ALCOHOL (LIQUIFILM TEARS) 1.4 % OPHTHALMIC SOLUTION    Place 1 drop into both eyes 2 (two) times daily.   SERTRALINE (ZOLOFT) 100 MG TABLET    Take 0.5 tablets (50 mg total) by mouth daily.   WATER FOR IRRIGATION, STERILE (STERILE WATER FOR IRRIGATION)     Irrigate with 100 mLs as directed every 8 (eight) hours.   Modified Medications   No medications on file  Discontinued Medications   LISINOPRIL (PRINIVIL,ZESTRIL) 40 MG TABLET    Take 40 mg by mouth daily.     Physical Exam:  Filed Vitals:   01/14/13 1336  BP: 142/79  Pulse: 64  Temp: 97.1 F (36.2 C)  Resp: 20    Physical Exam  Constitutional: He is well-developed, well-nourished, and in no distress.  HENT:  Head: Normocephalic and atraumatic.  Mouth/Throat: Oropharynx is clear and moist. No oropharyngeal exudate.  Eyes: Conjunctivae and EOM are normal. Pupils are equal, round, and reactive to light.  Neck: Normal range of motion. Neck supple.  Cardiovascular: Normal rate, regular rhythm and normal heart sounds.   Pulmonary/Chest: Effort normal and breath sounds normal. No respiratory distress.  Abdominal: Soft. Bowel sounds are normal.  Musculoskeletal: He exhibits no edema and no tenderness.  Right sided weakness  Neurological: He is alert.  Skin: Skin is warm and dry. No erythema.     Labs reviewed: Basic Metabolic Panel:  Recent Labs  16/10/9604/24/14 0500  07/05/12 0500 07/06/12 0545  07/11/12 1622  07/16/12 0500 07/17/12 0545 09/24/12 0930  NA 158*  < > 157* 152*  < > 171*  < > 151* 148* 134*  K 3.9  < > 4.1 4.4  < > 4.3  < > 4.2 3.3* 4.3  CL 120*  < > 119* 114*  < > >130*  < > 116* 113* 97  CO2 27  < > 28 27  < > 23  < > 25 24 25   GLUCOSE 220*  < > 209* 287*  < > 410*  < > 231* 119* 106*  BUN 48*  < > 55* 61*  < > 89*  < > 43* 36* 28*  CREATININE 1.88*  < > 1.95* 2.01*  < > 2.48*  < > 1.57* 1.50* 0.83  CALCIUM 8.8  < > 9.0 8.7  < > 8.4  < > 8.2* 8.2* 9.6  MG 2.5  --  2.5 2.6*  --  3.0*  --   --   --   --   PHOS 3.8  --  4.1 4.6  --   --   --   --   --   --   < > = values in this interval not displayed. Liver Function Tests:  Recent Labs  06/24/12 0410 07/08/12 1412 09/24/12 0930  AST 41* 338* 26  ALT 20 138* 28  ALKPHOS 47 135* 92  BILITOT 0.5  0.4 0.3  PROT 6.5 8.5* 7.8  ALBUMIN 2.6* 2.3* 3.4*   No results found for this basename: LIPASE,  AMYLASE,  in the last 8760 hours No results found for this basename: AMMONIA,  in the last 8760 hours CBC:  Recent Labs  06/29/12 0500  07/08/12 1412  07/16/12 0500 07/17/12 0545 09/24/12 0930  WBC 16.3*  < > 20.8*  < > 14.3* 13.1* 8.5  NEUTROABS 13.7*  --  18.3*  --   --   --  5.7  HGB 13.1  < > 13.4  < > 12.0* 12.2* 12.9*  HCT 42.7  < > 45.2  < > 40.9 41.6 39.2  MCV 64.0*  < > 68.5*  < > 70.0* 69.6* 72.6*  PLT 249  < > 323  < > 114* 132* 228  < > = values in this interval not displayed. Cardiac Enzymes:  Recent Labs  06/22/12 2216 09/24/12 0930 09/24/12 2013  TROPONINI 10.83* <0.30 <0.30   BNP: No components found with this basename: POCBNP,  CBG:  Recent Labs  07/17/12 1652 09/24/12 1806 09/24/12 2026  GLUCAP 120* 101* 97   TSH: No results found for this basename: TSH,  in the last 8760 hours A1C: Lab Results  Component Value Date   HGBA1C 6.3* 09/24/2012   Lipid Panel:  Recent Labs  06/22/12 0525 06/25/12 0356 06/28/12 0600  CHOL 185  --   --   HDL 58  --   --   LDLCALC 063*  --   --   TRIG 92 128 294*  CHOLHDL 3.2  --   --     LABS REVIEWED:  09-24-12: wbc 8.5; hgb 12.9; hct 39.2; mcv 72.6; plt 228; glucose 106; bun 28; creat 0.83; k+4.3; na++143; liver normal albumin 3.4; hgb a1c 6.3  12-12-12: wbc 4.9; hgb 12.5; hct 37.0; mcv 71.6; plt 162; glucose 98; bun 17; creat 1.12; k+4.0; na++145; chol 133; ldl 71; trig 81; hgb a1c 6.3    Assessment/Plan 1. Malignant hypertension -blood pressure still remains high despite being on cozaar, catapres 0.3, hydralazine, isordil, lopressor -pt has hydralazine 50 mg q6 hours scheduled and q 12 PRN for sbp over 170 -currently receiving Cozaar 50 mg daily which is the newest medications- will increase this to 100 mg daily -will follow up BMP in 3 weeks   2. Diabetes mellitus with neurological manifestations,  controlled -stable on metformin  3. Depression -per staff mood has been stable; pt reports he is in a good mood; denies depressive symptoms at this time -conts on Depakote and Zoloft for depression -being seen by psych services   4. Late effects of CVA (cerebrovascular accident) -with dysphagia and weakness -no signs of aspiration at this time -conts on ASA

## 2013-03-05 ENCOUNTER — Non-Acute Institutional Stay (SKILLED_NURSING_FACILITY): Payer: PRIVATE HEALTH INSURANCE | Admitting: Internal Medicine

## 2013-03-05 DIAGNOSIS — I6990 Unspecified sequelae of unspecified cerebrovascular disease: Secondary | ICD-10-CM

## 2013-03-05 DIAGNOSIS — E1149 Type 2 diabetes mellitus with other diabetic neurological complication: Secondary | ICD-10-CM

## 2013-03-05 DIAGNOSIS — I10 Essential (primary) hypertension: Secondary | ICD-10-CM

## 2013-03-05 NOTE — Progress Notes (Signed)
Patient ID: [name], male   DOB: 1955-07-11, 58 y.o.   MRN: [id number]              DATE:  03/05/13  [facility]AC[facility]    LEVEL OF CARE:   SNF   CHIEF COMPLAINT:  U. of medical issues post transfer to [facility]  HISTORY OF PRESENT ILLNESS:    This is a 58 year-old man who had a right cortical stroke in 2009 with left-sided weakness, and a left middle cerebral artery stroke at some time after that.  He was  apparently in a rehab facility until September when he was noted to be much less verbally fluent.  There was also noted right-sided facial droop.  CT scan of the head showed no change from previous.  An MRI was done that showed a small acute infarct in the white matter on the left with chronic ischemic changes.  He also had a run of ventricular tachycardia.  Finally, he had malignant hypertension.    I think he was sent back to the [facility], but has been transferred here for ongoing care.    An MRI of the head on 09/24/2012 showed a left parietal hematoma measuring 45 x 38 mm.  This hematoma was also present on July 03, 2012.  There was also chronic hemorrhage in the left frontal lobe and right parietal lobe.  He has extensive chronic ischemic change including a chronic infarct in the pons bilaterally, left greater than right, a chronic infarct in the thalamus bilaterally, and chronic microvascular ischemic change in the white matter, right greater than left.  The overall impression was that there was a possible small acute infarct in the deep white matter on the left with extensive chronic ischemic change, subacute hematoma on the left parietal lobe.    PAST MEDICAL HISTORY/PROBLEM LIST:  Stroke.    Accelerated/malignant hypertension.    Required intubation in June 2014.    Coronary artery disease.  Status post stent placement.    CURRENT medications reviewed  SOCIAL HISTORY:  I really have no information other than the patient seems to have come from the skilled unit of the  [facility].  Exact circumstances here are unclear.    FAMILY HISTORY:  Not available from any current source.    REVIEW OF SYSTEMS:  Really not possible from the patient.  However, he has severe neurologic damage, according to therapy.  He is not able to stand.  Cannot consistently feed himself.  Does not seem to have any swallowing issues.  He does have a PEG tube that we are not using.    PHYSICAL EXAMINATION:   VITAL SIGNS:    PULSE:  64   RESPIRATIONS:  19 Blood pressure; 154/88   GENERAL APPEARANCE:  Very disabled, 58 year-old man, but in no distress.   His speech is dysarthric.   HEENT:   MOUTH/THROAT:   Oral exam is normal.   CHEST/RESPIRATORY:  Clear air entry bilaterally.   CARDIOVASCULAR:  CARDIAC:   Heart sounds are normal.  There are no murmurs.  No gallops.   GASTROINTESTINAL:  LIVER/SPLEEN/KIDNEYS:  No liver, no spleen.   ABDOMEN:  No masses.  PEG site looks fine.   GENITOURINARY:  BLADDER:   Not distended.  There is no CVA tenderness.   NEUROLOGICAL:   This man is very badly damaged with bihemispheric findings.   CRANIAL NERVES:  He does not have conjugate gaze in any direction.  His eyes seem fixed forward.  SENSATION/STRENGTH:  He has hemiparesis on the right side.  He appears to have some use of his left hand, left arm and left leg, although he has increased tone here.  He has markedly increased nuchal rigidity.   DEEP TENDON REFLEXES:  Reflexes seem symmetric.  His toes are downgoing.  He does not have a Hoffmann's reflex, although he does have primitive reflexes.   OTHER:  Speech is very dysarthric.  He seems to have some comprehension, but his verbal output is very difficult to understand.    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  The exact sequence of his problems is really not clear to me at this time.    Type 2 diabetes, although that was not listed on his  problem list at [facility] in June and September as far as I can tell.   He is on metformin.    History of malignant hypertension.  He is on a large number of blood pressure medications.  Recently started on Cozaar  Depression.  He is on Zoloft. Depakote is recently been added by psychiatry    Hyperlipidemia.  On Lipitor.    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  He is from [place].  His second stroke is the current one, although I think that was probably in June and he may have had a subsequent event in September.    The need for the Haldol is not clear and I would like to begin a very gradual withdrawal of this medication to see if he becomes less rigid.    I would like to verify the dose of the metoprolol.  He is currently on 25 q.6, although I think he might have been on 75 q.6 previously.

## 2013-04-03 LAB — HEPATIC FUNCTION PANEL
ALT: 12 U/L (ref 10–40)
AST: 14 U/L (ref 14–40)
Alkaline Phosphatase: 66 U/L (ref 25–125)

## 2013-05-03 LAB — BASIC METABOLIC PANEL
BUN: 21 mg/dL (ref 4–21)
CREATININE: 1.4 mg/dL — AB (ref 0.6–1.3)
GLUCOSE: 104 mg/dL
Potassium: 4.1 mmol/L (ref 3.4–5.3)
Sodium: 142 mmol/L (ref 137–147)

## 2013-05-03 LAB — CBC AND DIFFERENTIAL
HCT: 35 % — AB (ref 41–53)
Hemoglobin: 11.6 g/dL — AB (ref 13.5–17.5)
PLATELETS: 177 10*3/uL (ref 150–399)
WBC: 5 10^3/mL

## 2013-05-03 LAB — HEMOGLOBIN A1C: Hgb A1c MFr Bld: 6.2 % — AB (ref 4.0–6.0)

## 2013-06-04 ENCOUNTER — Other Ambulatory Visit: Payer: Self-pay | Admitting: *Deleted

## 2013-06-04 MED ORDER — CLONAZEPAM 0.5 MG PO TABS: ORAL_TABLET | ORAL | Status: DC

## 2013-06-04 NOTE — Telephone Encounter (Signed)
[facility] 

## 2013-06-07 ENCOUNTER — Non-Acute Institutional Stay (SKILLED_NURSING_FACILITY): Payer: PRIVATE HEALTH INSURANCE | Admitting: Internal Medicine

## 2013-06-07 DIAGNOSIS — I6990 Unspecified sequelae of unspecified cerebrovascular disease: Secondary | ICD-10-CM

## 2013-06-07 DIAGNOSIS — I10 Essential (primary) hypertension: Secondary | ICD-10-CM

## 2013-06-07 DIAGNOSIS — E1149 Type 2 diabetes mellitus with other diabetic neurological complication: Secondary | ICD-10-CM

## 2013-06-07 DIAGNOSIS — R4789 Other speech disturbances: Secondary | ICD-10-CM

## 2013-06-07 DIAGNOSIS — R4702 Dysphasia: Secondary | ICD-10-CM

## 2013-06-07 NOTE — Progress Notes (Signed)
Patient ID: [name], male   DOB: [birth year], 58 y.o.   MRN: [id number]  DATE:  06/07/2013  [facility]    LEVEL OF CARE:   SNF      Review of medical issues, [facility] visit for April  HISTORY OF PRESENT ILLNESS:    This is a 58 year-old man who had a right cortical stroke in 2009 with left-sided weakness, and a left middle cerebral artery stroke at some time after that.  He was  apparently in a rehab facility until September when he was noted to be much less verbally fluent.  There was also noted right-sided facial droop.  CT scan of the head showed no change from previous.  An MRI was done that showed a small acute infarct in the white matter on the left with chronic ischemic changes.  He also had a run of ventricular tachycardia.  Finally, he had malignant hypertension.    I think [facility] was [facility], but has been transferred here for ongoing care.    An MRI of the head on 09/24/2012 showed a left parietal hematoma measuring 45 x 38 mm.  This hematoma was also present on July 03, 2012.  There was also chronic hemorrhage in the left frontal lobe and right parietal lobe.  He has extensive chronic ischemic change including a chronic infarct in the pons bilaterally, left greater than right, a chronic infarct in the thalamus bilaterally, and chronic microvascular ischemic change in the white matter, right greater than left.  The overall impression was that there was a possible small acute infarct in the deep white matter on the left with extensive chronic ischemic change, subacute hematoma on the left parietal lobe.    PAST MEDICAL HISTORY/PROBLEM LIST:  Stroke.    Accelerated/malignant hypertension.    Required intubation in June 2014.    Coronary artery disease.  Status post stent placement.   Current medications Catapres TTS 3 patch 1 patch weekly  Lipitor 10 mg daily Zoloft 100 daily Prilosec 20 mg daily Keppra 500 every 12 Klonopin 0.5 every 12 Isordil 80  mg 3 times a day Cozaar 100 daily Lopressor 25 every 6 hours Hydralazine 50 mg every 6 hours  ASA 81 daily   the upright acid sprinkles 125 2 capsules every night to stabilize depressive mood  CURRENT medications reviewed  SOCIAL HISTORY:  I really have no information other than the patient seems to have come from the [facility] of [facility].  Exact circumstances here are unclear.    FAMILY HISTORY:  Not available from any current source.    REVIEW OF SYSTEMS:  Really not possible from the patient.  However, he has severe neurologic damage, according to therapy.  He is not able to stand.  Cannot consistently feed himself.  Does not seem to have any swallowing issues.  He does have a PEG tube that we are not using.    PHYSICAL EXAMINATION:   VITAL SIGNS:    Temperature 96.7-pulse 50-respirations 18-blood pressure 145/78-weight 167 pounds GENERAL APPEARANCE:  Very disabled, 58 year-old man, but in no distress.   His speech is dysarthric.     CHEST/RESPIRATORY:  Clear air entry bilaterally.   CARDIOVASCULAR:  CARDIAC:   Heart sounds are normal.  There are no murmurs.  No gallops.   GASTROINTESTINAL:  LIVER/SPLEEN/KIDNEYS:  No liver, no spleen.   ABDOMEN:  No masses.  PEG site looks fine.   GENITOURINARY:  BLADDER:   Not  distended.  There is no CVA tenderness.   NEUROLOGICAL:   This man is very badly damaged with bihemispheric findings.   CRANIAL NERVES:  He does not have conjugate gaze in any direction.  His eyes seem fixed forward.   SENSATION/STRENGTH:  He has hemiparesis on the right side.  He appears to have some use of his left hand, left arm and left leg, although he has increased tone here.  He has markedly increased nuchal rigidity.   DEEP TENDON REFLEXES:  Reflexes seem symmetric.  His toes are downgoing.  He does not have a Hoffmann's reflex, although he does have primitive reflexes.   OTHER:  Speech is very dysarthric.  He seems to have some comprehension, but his  verbal output is very difficult to understand.    Labs; from 4/24 shows a hemoglobin of 11.6 mild microcytic hyperchromic indices BUN 21 creatinine of 1.41 a microalbumin to creatinine ratio  78.1 mg per gram. 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    Type 2 diabetes, although that was not listed on his problem list at [facility] in June and September as far as I can tell.   He is on metformin.    History of malignant hypertension.  He is on a large number of blood pressure medications.  Recently started on Cozaar. Most of his blood pressures appear to be under decent control  Depression.  He is on Zoloft. Depakote is recently been added by psychiatry    Hyperlipidemia.  On Lipitor.   .      Chronic renal failure. 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  He is from [place].  His second stroke is the current one, although I think that was probably in June and he may have had a subsequent event in September.    The need for the Haldol is not clear and I would like to begin a very gradual withdrawal of this medication to see if he becomes less rigid.    I would like to verify the dose of the metoprolol.  He is currently on 25 q.6, although I think he might have been on 75 q.6 previously.

## 2013-06-11 ENCOUNTER — Other Ambulatory Visit (HOSPITAL_BASED_OUTPATIENT_CLINIC_OR_DEPARTMENT_OTHER): Payer: Self-pay | Admitting: Internal Medicine

## 2013-06-11 DIAGNOSIS — R1310 Dysphagia, unspecified: Secondary | ICD-10-CM

## 2013-06-18 ENCOUNTER — Ambulatory Visit (HOSPITAL_COMMUNITY)
Admission: RE | Admit: 2013-06-18 | Discharge: 2013-06-18 | Disposition: A | Discharge status: Home / Self Care | Payer: Medicare Other | Source: Ambulatory Visit | Attending: Internal Medicine | Admitting: Internal Medicine

## 2013-06-18 DIAGNOSIS — R1310 Dysphagia, unspecified: Secondary | ICD-10-CM

## 2013-06-18 DIAGNOSIS — Z431 Encounter for attention to gastrostomy: Secondary | ICD-10-CM | POA: Insufficient documentation

## 2013-06-18 NOTE — Procedures (Signed)
Successful removal of the gastrostomy tube.  No immediate complication.

## 2013-06-23 ENCOUNTER — Non-Acute Institutional Stay (SKILLED_NURSING_FACILITY): Payer: PRIVATE HEALTH INSURANCE | Admitting: Internal Medicine

## 2013-06-23 DIAGNOSIS — E1149 Type 2 diabetes mellitus with other diabetic neurological complication: Secondary | ICD-10-CM

## 2013-06-23 DIAGNOSIS — I6990 Unspecified sequelae of unspecified cerebrovascular disease: Secondary | ICD-10-CM

## 2013-06-23 DIAGNOSIS — I10 Essential (primary) hypertension: Secondary | ICD-10-CM

## 2013-06-25 NOTE — Progress Notes (Addendum)
Patient ID: [name], male   DOB: Apr 25, 1955, 58 y.o.   MRN: [id number]                  PROGRESS NOTE  DATE:  06/23/2013    [facility]    LEVEL OF CARE:   SNF   Routine Visit   CHIEF COMPLAINT:  Review of medical issues/[facility] visit for May.    HISTORY OF PRESENT ILLNESS:  This is a 58 year-old man who had a right cortical stroke in 2009 with left-sided weakness, and a left middle cerebral artery stroke sometime after that.    He was in hospital in September 2014, at which time he was felt to have a small acute infarct in the white matter on the left with chronic ischemic changes.  He also had a run of ventricular tachycardia and malignant hypertension.    An MRI of the head in September 2014 showed an old left parietal hematoma.  There was also chronic hemorrhage in the left frontal lobe and right parietal lobe.  He has extensive chronic ischemic change including a chronic infarct in the pons bilaterally, left greater than right, a chronic infarct in the thalamus bilaterally, and chronic microvascular ischemic change in the white matter, right greater than left.    PAST MEDICAL HISTORY/PROBLEM LIST:  Multiple cerebral infarctions.    Accelerated hypertension.    Coronary artery disease.  Status post stent placement.    SOCIAL HISTORY:  I really have no information on this patient other than he seems to have come from a skilled facility.   EMPLOYMENT HISTORY:  He was a Engineer, civil (consulting)nurse, I believe, and was in medical school, as well, at one point.  CURRENT MEDICATIONS:  Medication list is reviewed.     Catapres-TTS patch 0.3 mg/24 hr, 1 patch weekly.    Aspirin 81 q.d.    Lipitor 10 q.d.    Zoloft 100 q.d.     Prilosec 20 q.d.    Keppra 500 q.12.    MiraLAX 17 g daily.    Klonopin 0.5 q.12.    Isordil 80 mg three times a day.    Cozaar 100 q.d.    Lopressor 25 mg every 6 hours.    Apresoline 50 mg every 6 hours.    REVIEW OF SYSTEMS:  Really not possible  from the patient.  However, he is able to occasionally answer yes and no.   NEUROLOGICAL:   He is not able to stand.   GI:  He does have a PEG tube that we are not using.  This may at some point be able to be removed.    PHYSICAL EXAMINATION:   VITAL SIGNS:   TEMPERATURE:  97.8.   PULSE:  54.   RESPIRATIONS:  18.    BLOOD PRESSURE:  160/76.   WEIGHT:   169 pounds.   GENERAL APPEARANCE:   Very disabled, 58 year-old man in no distress.   His speech is dysarthric.   CHEST/RESPIRATORY:  Clear air entry bilaterally.   CARDIOVASCULAR:  CARDIAC:   Heart sounds are normal.  There are no murmurs.  No S4 is heard.   GASTROINTESTINAL:  LIVER/SPLEEN/KIDNEYS:  No liver, no spleen.   ABDOMEN:  PEG site looks fine.   GENITOURINARY:  BLADDER:   Not distended.  There is no CVA tenderness.   NEUROLOGICAL:   This man has very marked bihemispheric damage.    SPEECH:  He appears to have some comprehension, but his verbal output is  very difficult to understand.    ASSESSMENT/PLAN:  Bihemispheric stroke damage with a previous left intracerebral hemorrhagic hemorrhage, resulting in very significant aphasia.    Type 2 diabetes with renal manifestations.  He is on metformin.  Last hemoglobin A1c was well controlled at 6.2.  He  apparently does have microalbuminuria.    History of malignant hypertension.  This is currently mostly controlled on a wide number of antihypertensive medications.    Hyperlipidemia.  On Lipitor.    Chronic renal disease stage II.     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  He has previously trained as a Development worker, communityphysician.  His second stroke was in June and then he had a subsequent event in September 2014.

## 2013-07-16 ENCOUNTER — Encounter: Payer: Self-pay | Admitting: Internal Medicine

## 2013-07-16 ENCOUNTER — Non-Acute Institutional Stay (SKILLED_NURSING_FACILITY): Payer: PRIVATE HEALTH INSURANCE | Admitting: Internal Medicine

## 2013-07-16 DIAGNOSIS — I6990 Unspecified sequelae of unspecified cerebrovascular disease: Secondary | ICD-10-CM

## 2013-07-16 DIAGNOSIS — R531 Weakness: Secondary | ICD-10-CM | POA: Insufficient documentation

## 2013-07-16 DIAGNOSIS — F32A Depression, unspecified: Secondary | ICD-10-CM

## 2013-07-16 DIAGNOSIS — R5383 Other fatigue: Secondary | ICD-10-CM

## 2013-07-16 DIAGNOSIS — F329 Major depressive disorder, single episode, unspecified: Secondary | ICD-10-CM

## 2013-07-16 DIAGNOSIS — F3289 Other specified depressive episodes: Secondary | ICD-10-CM

## 2013-07-16 DIAGNOSIS — R5381 Other malaise: Secondary | ICD-10-CM

## 2013-07-16 NOTE — Assessment & Plan Note (Signed)
He should continue to work with therapy to increase strength and functional capability while we work up his general weakness. Another CVA is in the differential but lower on the list. Will continue ASA and antihypertensives to reduce risk.

## 2013-07-16 NOTE — Assessment & Plan Note (Signed)
There could be multiple reasons for this issue. I do not think this is related to depression. I will check a CK for myopathy since he is on a statin. He is gaining weight so we will check a TSH.  There has been some concern for another CVA. He is stable with no further focal deficit. He has an overall decline. Another cause for this could be disuse.

## 2013-07-16 NOTE — Assessment & Plan Note (Signed)
Continue Zoloft and Depakote. I do not think this is related to his depression. He may have some pseudobulbar effect.

## 2013-07-16 NOTE — Progress Notes (Signed)
Patient ID: [name], male   DOB: 01-29-1955, 58 y.o.   MRN: [id number]

## 2013-07-16 NOTE — Progress Notes (Signed)
Patient ID: [name], male   DOB: 26-May-1955, 58 y.o.   MRN: [id number]   [facility] (31)  Code Status:  Full code  Chief Complaint  Patient presents with  . increased weakness    HPI: This is a 58 y.o. AA male with a hx significant multiple CVA's, HTN, and DM. He currently resides at [facility]. We were asked to see him secondary to general weakness and functional decline. The nurse reports that he used to stand and pivot with a assistance but can not do this now. She reports that he has decreased use of both arms and is not as participatory in therapy. This decline has been noticed over the past couple months. His son is at the bedside and reports that he can have a conversation with the resident. [place] are originally from [place] and [place] their second language. Prior to the stroke the nurse reports that he was speaking [place] fluently. Currently the resident only can say his name and "yes" or "no". There is no change regarding this per the nurse. He has been gaining weight since his arrival and currently weighs 169 lbs, up 20lbs from February 2015. The nurse reports that he has intermittent crying episodes, but also smiles at times. Some of his overall decline was thought to be due to depression and he was placed on Zoloft and Depakote. He was also given Baclofen for rigidity but this was discontinued due to concern for sedation. His son feels that he does not want to participate in therapy because he wants to go home and does not want to live at [facility] anymore.    No Known Allergies  MEDICATIONS -  Reviewed  DATA REVIEWED  Radiologic Exams MRI of head (09/2012) IMPRESSION:  Probable small acute infarct in the deep white matter on the left.  Extensive chronic ischemic change. Late subacute hematoma left  parietal lobe measures 45 x 38 mm. There are areas of chronic  hemorrhage bilaterally. MRI of right lower ext secondary to mass (09/2012) IMPRESSION:  1. 2 cm enhancing lesion in the lateral soleus muscle. This could represent a nerve sheath tumor, primary muscle lesion or metastatic focus. Recommend biopsy. 2. Nonspecific myositis in the right lower extremity musculature. 3. No significant bony findings.   Laboratory Studies Lab Results  Component Value Date   WBC 5.0 05/03/2013   HGB 11.6* 05/03/2013   HCT 35* 05/03/2013   MCV 72.6* 09/24/2012   PLT 177 05/03/2013   Lab Results  Component Value Date   NA 142 05/03/2013   K 4.1 05/03/2013   GLU 104 05/03/2013   BUN 21 05/03/2013   CREATININE 1.4* 05/03/2013   Lab Results  Component Value Date   CALCIUM 9.6 09/24/2012   ALBUMIN 3.4* 09/24/2012   AST 14 04/03/2013   ALT 12 04/03/2013   ALKPHOS 66 04/03/2013   BILITOT 0.3 09/24/2012   GFRNONAA >90 09/24/2012   GFRAA >90 09/24/2012   No results found for this basename: vitaminb12, vitamind   Lab Results  Component Value Date   HGBA1C 6.2* 05/03/2013   Lab Results  Component Value Date   CHOL 133 12/12/2012   HDL 46 12/12/2012   LDLCALC 71 12/12/2012   TRIG 81 12/12/2012   CHOLHDL 3.2 06/22/2012  05/10/13: Iron 68, TIBC%sat 26, B12-946, Folate >20,  07/15/13-Alb-3.9  REVIEW OF SYSTEMS  DATA OBTAINED: from pt, son, nurse, medical record. Pt is a poor historian GENERAL: No fever. No change in  appetite. Weight trending upward RESPIRATORY: No cough, wheezing, SOB CARDIAC: No chest pain, palpitations. No edema GI: No abdominal pain  No Nausea,vomiting,diarrhea or constipation  No heartburn or reflux  MUSCULOSKELETAL:throughoutincreased rigidity to arms and legs; increased weakness throughout. No recent falls or reports of pain. Not participating in therapy as well NEUROLOGIC: No change in mental status.  No difficulty swallowing. No dizziness or fainting. PSYCHIATRIC: intermittent crying episodes that are unprovoked, other times smiling.  PHYSICAL EXAM Filed Vitals:   07/16/13 1106  BP: 145/84  Pulse: 57  Temp: 97.5 F (36.4 C)  Resp: 18    Weight: 169 lb (76.658 kg)   Body mass index is 24.25 kg/(m^2). GENERAL APPEARANCE: No acute distress, appropriately groomed, normal body habitus. Alert, pleasant. Answers yes and no at times.  SKIN: No diaphoresis, rash, unusual lesions, wounds HEAD: Normocephalic, atraumatic EYES: Conjunctiva/lids clear. Pupils round, reactive.  MOUTH/THROAT: Lips w/o lesions. Oral mucosa, tongue moist, w/o lesion. Oropharynx w/o redness or lesions.  NECK: Supple, full ROM. No thyroid tenderness, enlargement or nodule LYMPHATICS: No head, neck or supraclavicular adenopathy RESPIRATORY: Breathing is even, unlabored. Lung sounds are clear and full.  CARDIOLOGY: RRR, no gallop or run.   EDEMA: No peripheral edema. No ascites GASTROINTESTINAL: Abdomen is soft, non-tender, not distended w/ normal bowel sounds. No hepatic or splenic enlargement. No mass, ventral or inguinal hernia. MUSCULOSKELETAL: General weakness noted. Rigidity noted to arms and legs. Strength 2/5 to legs. 2/5 to right arm, 3/5 to left arm NEUROLOGIC: Difficulty following commands for exam. Dysarthria noted.  Brachial reflexes more brisk on the left.  Negative Hoffman sign. PSYCHIATRIC: Mood and affect appropriate to situation. Alert, oriented to self only. Smiling.  ASSESSMENT/PLAN  General weakness There could be multiple reasons for this issue. I do not think this is related to depression. I will check a CK for myopathy since he is on a statin. He is gaining weight so we will check a TSH.  There has been some concern for another CVA. He is stable with no further focal deficit. He has an overall decline. Another cause for this could be disuse.   Late effects of CVA (cerebrovascular accident) He should continue to work with therapy to increase strength and functional capability while we work up his general weakness. Another CVA is in the differential but lower on the list. Will continue ASA and antihypertensives to reduce risk.    Depression Continue Zoloft and Depakote. I do not think this is related to his depression. He may have some pseudobulbar effect.    Steve RattlerMichael Ayo Guarino M. D./Christina Wert RN, MSN 07/16/2013  Patient is review and discussed with Ms Sherene SiresWert. History is that the patient has undergone an abrupt functional and physical decline over the last month with increased upper and lower extremity weakness.  He has a history of a severe bilateral stroke syndrome but never the less could raise his arms enough to help with his dressing. He apparently could stand and transfer with a transfer belt and a maximal assist of one etc. there was some question about whether this could be depression and was seen by psychiatry and his medications were adjusted however it really is not apparent to me that this has anything to do with depression.  Physical examination; essentially as listed above of particular note there is no evidence that this man is depressed enough to have done what I am describing above Neurologic; his cranial nerves 5, 7 and visual fields appear to be normal. He does not  have antigravity strength in either arm this is apparently quite a change. He is hyperreflexic in the left. Again the findings in the lower extremities are about the same he has some strength in the left leg greater than the right slightly hyperreflexic in the left greater than right. He is also very hampered in terms of his communication which I think is a late effect of the dominant hemisphere parietal hematoma  Impression/plan  #1 recent deterioration as noted. I do not think this has anything to do with depression. He may have a component of pseudobulbar affect as stated above. We will screen him with a total CK for a statin induced myopathy. Although at the bedside I can't really localize this to upper or lower motor neuron weakness. It is also reasonable to screen his thyroid hormone status. Beyond this this does not appear to be a  psychiatric illness at and I don't believe that this is medication toxicity issue.

## 2013-07-23 ENCOUNTER — Other Ambulatory Visit (HOSPITAL_BASED_OUTPATIENT_CLINIC_OR_DEPARTMENT_OTHER): Payer: Self-pay | Admitting: Internal Medicine

## 2013-07-23 ENCOUNTER — Non-Acute Institutional Stay (SKILLED_NURSING_FACILITY): Payer: PRIVATE HEALTH INSURANCE | Admitting: Internal Medicine

## 2013-07-23 ENCOUNTER — Encounter: Payer: Self-pay | Admitting: Internal Medicine

## 2013-07-23 DIAGNOSIS — R531 Weakness: Secondary | ICD-10-CM

## 2013-07-23 DIAGNOSIS — I10 Essential (primary) hypertension: Secondary | ICD-10-CM

## 2013-07-23 DIAGNOSIS — R5381 Other malaise: Secondary | ICD-10-CM

## 2013-07-23 DIAGNOSIS — M6281 Muscle weakness (generalized): Secondary | ICD-10-CM

## 2013-07-23 DIAGNOSIS — I639 Cerebral infarction, unspecified: Secondary | ICD-10-CM

## 2013-07-23 DIAGNOSIS — I6990 Unspecified sequelae of unspecified cerebrovascular disease: Secondary | ICD-10-CM

## 2013-07-23 DIAGNOSIS — R5383 Other fatigue: Secondary | ICD-10-CM

## 2013-07-23 NOTE — Assessment & Plan Note (Addendum)
Progressively weaker. This is partially due to his lack of participation in therapy. I do not think this is related to depression. His most recent CMP, TSH, and CK were normal. I will order a CT of his head without contrast to eval his decreased function and weakness.  Also D/C Lipitor even though the CK was normal there could still be some myalgia contributing to his decline. Will restart if no improvement.

## 2013-07-23 NOTE — Progress Notes (Signed)
Patient ID: [name], male   DOB: 04-11-1955, 58 y.o.   MRN: [id number]   [facility] (31)  Code Status:  Full code  Chief Complaint  Patient presents with  . f/u weakness and htn    HPI: This is a 58 y.o. AA male with a hx significant multiple CVA's, HTN, and DM. He currently resides at [facility]. We were asked to see him secondary to general weakness and functional decline last week. There was concern for a myopathy and a CK was ordered along with a TSH and CMP and these labs returned within normal limits. He appears more weak today. He has been not participating in therapy and states that he "does not want to". He is difficult to communicate with due to his speech deficits and language barrier. Since last week the staff reports that they have not witnessed any crying episodes and that he is up in the chair more often than before. During my visit he was noted to have a BP of 225/112 on an electric cuff. The nurse gave him a prn apresoline and his BP returned 148/72. He is asymptomatic. He had no complaints for his visit today.   No Known Allergies  MEDICATIONS -  Reviewed  DATA REVIEWED  Radiologic Exams MRI of head (09/2012) IMPRESSION:  Probable small acute infarct in the deep white matter on the left.  Extensive chronic ischemic change. Late subacute hematoma left  parietal lobe measures 45 x 38 mm. There are areas of chronic  hemorrhage bilaterally. MRI of right lower ext secondary to mass (09/2012) IMPRESSION: 1. 2 cm enhancing lesion in the lateral soleus muscle. This could represent a nerve sheath tumor, primary muscle lesion or metastatic focus. Recommend biopsy. 2. Nonspecific myositis in the right lower extremity musculature. 3. No significant bony findings.   Laboratory Studies Lab Results  Component Value Date   WBC 5.0 05/03/2013   HGB 11.6* 05/03/2013   HCT 35* 05/03/2013   MCV 72.6* 09/24/2012   PLT 177 05/03/2013   Lab Results  Component Value  Date   NA 142 05/03/2013   K 4.1 05/03/2013   GLU 104 05/03/2013   BUN 21 05/03/2013   CREATININE 1.4* 05/03/2013   Lab Results  Component Value Date   CALCIUM 9.6 09/24/2012   ALBUMIN 3.4* 09/24/2012   AST 14 04/03/2013   ALT 12 04/03/2013   ALKPHOS 66 04/03/2013   BILITOT 0.3 09/24/2012   GFRNONAA >90 09/24/2012   GFRAA >90 09/24/2012   No results found for this basename: vitaminb12,  vitamind   Lab Results  Component Value Date   HGBA1C 6.2* 05/03/2013   Lab Results  Component Value Date   CHOL 133 12/12/2012   HDL 46 12/12/2012   LDLCALC 71 12/12/2012   TRIG 81 12/12/2012   CHOLHDL 3.2 06/22/2012  05/10/13: Iron 68, TIBC%sat 26, B12-946, Folate >20,  07/15/13-Alb-3.9 07/17/13: Na 141, K 4, Cl 104, Glucose 91, BUN 19, Cr 1.2, AST 9, ALT 10, Alb 3.8, Ca 9.3, Ck 76, TSH 1.894  REVIEW OF SYSTEMS  DATA OBTAINED: from pt, son, nurse, medical record. Pt is a poor historian GENERAL: No fever. No change in appetite. Weight trending upward RESPIRATORY: No cough, wheezing, SOB CARDIAC: No chest pain, palpitations. No edema. Uncontrolled BP GI: No abdominal pain  No Nausea,vomiting,diarrhea or constipation  No heartburn or reflux  MUSCULOSKELETAL:throughoutincreased rigidity to arms and legs; increased weakness throughout. No recent falls or reports of pain. Not participating in therapy  as well NEUROLOGIC: No change in mental status.  No difficulty swallowing. No dizziness or fainting. PSYCHIATRIC: staff reports less crying episodes and that he is sitting up longer in the chair  PHYSICAL EXAM Filed Vitals:   07/23/13 1111  BP: 225/112  Pulse: 52  Temp: 97.5 F (36.4 C)  Resp: 18   There is no weight on file to calculate BMI. GENERAL APPEARANCE: No acute distress, appropriately groomed, normal body habitus. Alert, pleasant. Answers yes and no at times.  SKIN: No diaphoresis, rash, unusual lesions, wounds HEAD: Normocephalic, atraumatic EYES: Conjunctiva/lids clear. Pupils round, reactive.    MOUTH/THROAT: Lips w/o lesions. Oral mucosa, tongue moist, w/o lesion. Oropharynx w/o redness or lesions.  NECK: Supple, full ROM. No thyroid tenderness, enlargement or nodule LYMPHATICS: No head, neck or supraclavicular adenopathy RESPIRATORY: Breathing is even, unlabored. Lung sounds are clear and full.  CARDIOLOGY: RRR, no gallop or run.   EDEMA: No peripheral edema. No ascites GASTROINTESTINAL: Abdomen is soft, non-tender, not distended w/ normal bowel sounds. No hepatic or splenic enlargement. No mass, ventral or inguinal hernia. MUSCULOSKELETAL: General weakness noted, worse than last visit. Rigidity noted to arms and legs. Strength 1/5 to legs. 2/5 to right arm, 3/5 to left arm.  NEUROLOGIC: Difficulty following commands for exam. Dysarthria noted.  Brachial reflexes more brisk on the left.  Negative Hoffman sign.  PSYCHIATRIC: Mood and affect appropriate to situation. Alert, oriented to self only. Smiling.  ASSESSMENT/PLAN  General weakness Progressively weaker. This is partially due to his lack of participation in therapy. I do not think this is related to depression. His most recent CMP, TSH, and CK were normal. I will order a CT of his head without contrast to eval his decreased function and weakness.  Also D/C Lipitor even though the CK was normal there could still be some myalgia contributing to his decline. Will restart if no improvement.  Malignant hypertension In review his BP has ranged 140-180/70-90 over the past few weeks. He has received prn apresoline twice in the past two weeks. He is on a clonidine patch, Cozaar, Apresoline, Isordil, and Lopressor. We can't further his BB use due to his HR being rather low. Increase the apresoline to 100mg  TID and hold for SBP<100. Add Chlorthalidone 12.5mg  qd and recheck BMP in a week.    Steve Rattler D./Christina Wert RN, MSN 07/23/2013 that this is medication toxicity issue.  Major issue this week appears to be increasing  weakness in the both arm Rt. >left. Last week could lift his rt. Arm above his head, not this week. Seems to be continued increased weakness. The cause of this is not clear. Labs including Total ck and tsh are normal. BP was very elevated 212/125 now down after having received appresoline 50 is now down to 148 systolic. We have adjusted his blood pressure meds. I have ordered a ct scan of the head to exclude a large cortical  (bilateral) deficit. May require an MRI. I am putting the statin on hold. Follow-up next week.

## 2013-07-23 NOTE — Assessment & Plan Note (Addendum)
In review his BP has ranged 140-180/70-90 over the past few weeks. He has received prn apresoline twice in the past two weeks. He is on a clonidine patch, Cozaar, Apresoline, Isordil, and Lopressor. We can't further his BB use due to his HR being rather low. Increase the apresoline to 100mg  TID and hold for SBP<100. Add Chlorthalidone 12.5mg  qd and recheck BMP in a week.

## 2013-07-29 ENCOUNTER — Ambulatory Visit (HOSPITAL_COMMUNITY)
Admission: RE | Admit: 2013-07-29 | Discharge: 2013-07-29 | Disposition: A | Discharge status: Home / Self Care | Payer: Medicare Other | Source: Ambulatory Visit | Attending: Internal Medicine | Admitting: Internal Medicine

## 2013-07-29 ENCOUNTER — Ambulatory Visit (HOSPITAL_COMMUNITY): Payer: Medicare Other

## 2013-07-29 ENCOUNTER — Other Ambulatory Visit (HOSPITAL_COMMUNITY): Payer: Self-pay | Admitting: Internal Medicine

## 2013-07-29 DIAGNOSIS — R51 Headache: Secondary | ICD-10-CM | POA: Insufficient documentation

## 2013-07-29 DIAGNOSIS — R519 Headache, unspecified: Secondary | ICD-10-CM

## 2013-08-04 ENCOUNTER — Non-Acute Institutional Stay (SKILLED_NURSING_FACILITY): Payer: PRIVATE HEALTH INSURANCE | Admitting: Internal Medicine

## 2013-08-04 DIAGNOSIS — I6990 Unspecified sequelae of unspecified cerebrovascular disease: Secondary | ICD-10-CM

## 2013-08-04 DIAGNOSIS — R5383 Other fatigue: Secondary | ICD-10-CM

## 2013-08-04 DIAGNOSIS — I10 Essential (primary) hypertension: Secondary | ICD-10-CM

## 2013-08-04 DIAGNOSIS — R5381 Other malaise: Secondary | ICD-10-CM

## 2013-08-04 DIAGNOSIS — R531 Weakness: Secondary | ICD-10-CM

## 2013-08-04 NOTE — Progress Notes (Signed)
Patient ID: [name], male   DOB: 06-24-1955, 58 y.o.   MRN: [id number]  Fac[facility] Chief complaint followup increasing weakness predominantly in his upper extremities History; this is an unfortunate 58 yo man with a history of bihemispheric strokes. He has refractory hypertension. His original strokes left him with lower extremity weakness, aphasia, perhaps pseudobulbar affect that I've seen him on 2 different occasions for progressive weakness and declining function. He used to be able to raise his arms to assist with dressing etc. he is no longer able to do this in fact it is worsening. I put his statin on hold although this was not because there was no improvement his CK was normal at 76 thyroid function normal at 1.894. A CT scan of his head which was done to see if there was a major intracranial event was negative.  The last time I visited him we found his blood pressure to be elevated at 212/120. We increased his hydralazine and added chlorthalidone. Since then his blood pressures have generally been in the 110 to 1:30 systolic range  Review of systems; really not possible other than the 80 was working with him today he said he is eating well.  Physical examination As stated his blood pressure appears to be under good control Cardiac heart sounds are normal he appears to be euvolemic Neurologic; he does not have antigravity strength in either one of his arms. His reflexes are present at the bicep jerks there is no Hoffman's reflex. There is no fasciculations.  Impression/plan #1 worsening weakness in a patient with bihemispheric strokes chronically. To be frank I can't sort out whether this is even lower motor neuron or upper motor neuron. A nonenhanced CT scan does not show any major change. I am going to repeat his inflammatory markers, consider a serologic test for myasthenia gravis. Failing this I think he needs EMGs and nerve conduction studies #2 poorly controlled  hypertension. We increased his Apresoline to 100 mg by mouth 3 times a day and chlorthalidone at 12.5 but daily on 7/14 when he found his blood pressure to be 225/112. This is controlled his blood pressure however his BUN and creatinine have increased to 35 and 1.69 respectively this will need to be followed. 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 However he has deteriorated quite a bit in his upper extremity function including left side. He does not have antigravity strength in either arm or in either leg.   EXAM: CT HEAD WITHOUT CONTRAST   TECHNIQUE: Contiguous axial images were obtained from the base of the skull through the vertex without intravenous contrast.   COMPARISON:  MRI brain 09/24/2012   FINDINGS: There is no evidence of mass effect, midline shift or extra-axial fluid collections. There is no evidence of a space-occupying lesion or intracranial hemorrhage. There is no evidence of a cortical-based area of acute infarction. There is periventricular white matter low attenuation likely secondary to microangiopathy. There is left parietal lobe encephalomalacia from prior insult.   The ventricles and sulci are appropriate for the patient's age. The basal cisterns are patent.   Visualized portions of the orbits are unremarkable. The visualized portions of the paranasal sinuses and mastoid air cells are unremarkable.   The osseous structures are unremarkable.   IMPRESSION: No acute intracranial pathology.     Electronically Signed   By: Elige KoHetal  Patel   On: 07/29/2013 16:33

## 2013-09-24 ENCOUNTER — Non-Acute Institutional Stay (SKILLED_NURSING_FACILITY): Payer: PRIVATE HEALTH INSURANCE | Admitting: Internal Medicine

## 2013-09-24 DIAGNOSIS — R5383 Other fatigue: Secondary | ICD-10-CM

## 2013-09-24 DIAGNOSIS — E1149 Type 2 diabetes mellitus with other diabetic neurological complication: Secondary | ICD-10-CM

## 2013-09-24 DIAGNOSIS — R531 Weakness: Secondary | ICD-10-CM

## 2013-09-24 DIAGNOSIS — R5381 Other malaise: Secondary | ICD-10-CM

## 2013-09-24 DIAGNOSIS — I6990 Unspecified sequelae of unspecified cerebrovascular disease: Secondary | ICD-10-CM

## 2013-09-24 NOTE — Progress Notes (Signed)
Patient ID: [name], male   DOB: Sep 16, 1955, 58 y.o.   MRN: [id number]  Facility [facility] Chief complaint followup increasing weakness predominantly in his upper extremities History; this is an unfortunate 58 yo man with a history of bihemispheric strokes. He has refractory hypertension. His original strokes left him with lower extremity weakness, aphasia, perhaps pseudobulbar affect that I've seen him on 2 different occasions for progressive weakness and declining function. He used to be able to raise his arms to assist with dressing etc. he is no longer able to do this in fact it is worsening. 2 months ago I put his statin on hold although his CK was normal, that did not help thyroid function normal at 1.894. A CT scan of his head which was done to see if there was a major intracranial event was negative. His weakness in the upper extremities has continued to progress. I saw him sitting in the hallway the other day with his head on his chest although he seemed to be able to move this back to normal position. His acetylcholine receptor binding antibody was negative at 0.3 nmol per liter.  The last time I visited him we found his blood pressure to be elevated at 212/120. We increased his hydralazine and added chlorthalidone. Since then his blood pressures have generally been in the 110 to 130 systolic range  Review of systems; really not possible other than the staff was working with him today he said he is eating well, he is now being fed by his family  Physical examination BP 122/69 As stated his blood pressure appears to be under good control Cardiac heart sounds are normal he appears to be euvolemic Neurologic; he does not have antigravity strength in either one of his arms. His reflexes are present at the bicep jerks there is no Hoffman's reflex. There is no fasciculations.  Impression/plan #1 worsening weakness in a patient with bihemispheric strokes chronically. To be frank I can't sort  out whether this is even lower motor neuron or upper motor neuron.  I think EMGs and nerve conduction staff that he is of the upper extremities would be the next step to work this up. Question myopathy, neuropathy or cervical radiculopathy. I agree with the EMGs and nerve conduction studies for the consultation based on the result of this. #2 poorly controlled hypertension. Blood pressure is under better control. 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 However he has deteriorated quite a bit in his upper extremity function including left side. He does not have antigravity strength in either arm or in either leg.  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   COMPARISON:  MRI brain 09/24/2012   FINDINGS: There is no evidence of mass effect, midline shift or extra-axial fluid collections. There is no evidence of a space-occupying lesion or intracranial hemorrhage. There is no evidence of a cortical-based area of acute infarction. There is periventricular white matter low attenuation likely secondary to microangiopathy. There is left parietal lobe encephalomalacia from prior insult.   The ventricles and sulci are appropriate for the patient's age. The basal cisterns are patent.   Visualized portions of the orbits are unremarkable. The visualized portions of the paranasal sinuses and mastoid air cells are unremarkable.   The osseous structures are unremarkable.  IMPRESSION: No acute intracranial pathology.     Electronically Signed   By: Elige Ko   On: 07/29/2013 16:33

## 2013-10-03 ENCOUNTER — Ambulatory Visit (INDEPENDENT_AMBULATORY_CARE_PROVIDER_SITE_OTHER): Payer: PRIVATE HEALTH INSURANCE | Admitting: Neurology

## 2013-10-03 ENCOUNTER — Encounter: Payer: Self-pay | Admitting: Neurology

## 2013-10-03 VITALS — BP 102/70 | HR 60 | Wt 163.0 lb

## 2013-10-03 DIAGNOSIS — I6990 Unspecified sequelae of unspecified cerebrovascular disease: Secondary | ICD-10-CM

## 2013-10-03 DIAGNOSIS — R269 Unspecified abnormalities of gait and mobility: Secondary | ICD-10-CM

## 2013-10-03 HISTORY — DX: Unspecified abnormalities of gait and mobility: R26.9

## 2013-10-03 NOTE — Progress Notes (Signed)
Reason for visit: History of stroke  [name] is a 58 y.o. male  History of present illness:  [name] is a 58 year old black male with a history of stroke events on at least 2 occasions. The patient has had a stroke in 2009 associated with left-sided weakness, and he suffered another stroke in September 2014 with a right hemiparesis and aphasia. The patient has been in an extended care facility since that time. The patient has regained some function of the right side, and he was able to stand and do some therapy, and allow for transfers. The patient was able to use the right hand to some degree, he was able to hold a cup, and shake a hand. The patient in the last 2-3 months has had onset of weakness of the right side, he is no longer able to stand, transfer, or use the right arm. The patient has spasticity and stiffness of the left side the body as well. It is not clear that the function of the left side the body has changed. The patient has had ongoing aphasia without change. The patient does have episodes of crying frequently since the stroke in September 2014. The patient is sent to this office for evaluation of the change in his functional level that has occurred 2 or 3 months ago. The patient has undergone a CT scan of brain that does not show any definite acute changes.  Past Medical History  Diagnosis Date  . Stroke 2007  . Hypertension   . Difficult airway for intubation 06/22/2012    Deep and anterior. Difficult with standard laryngoscope. Easily intubated with Glidescope   . Coronary artery disease     Stent placement  . Malignant hypertension 07/14/2012  . 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 MSSA (methicillin susceptible Staphylococcus aureus) pneumonia 07/03/2012  . CVA (cerebral infarction) 06/22/2012  . Seizure, possible 06/22/2012  . Anxiety state, unspecified   . Zygomatic hyperplasia   . Abnormality of gait 10/03/2013    Past Surgical History    Procedure Laterality Date  . Coronary stent placement    . Cardiac surgery      Family History  Problem Relation Age of Onset  . Hypertension Mother   . Hypertension Father     Social history:  reports that he has never smoked. He has never used smokeless tobacco. He reports that he does not drink alcohol or use illicit drugs.  Medications:  Current Outpatient Prescriptions on File Prior to Visit  Medication Sig Dispense Refill  . acetaminophen (TYLENOL) 325 MG tablet Give 650 mg by tube every 6 (six) hours as needed (elevated temperature).      Marland Kitchen aspirin 81 MG chewable tablet Chew 81 mg by mouth daily.      Marland Kitchen atorvastatin (LIPITOR) 10 MG tablet Take 10 mg by mouth daily.      . chlorthalidone (HYGROTON) 25 MG tablet Take 12.5 mg by mouth daily.      . clonazePAM (KLONOPIN) 0.5 MG tablet Take one tablet by mouth every 12 hours  60 tablet  5  . cloNIDine (CATAPRES - DOSED IN MG/24 HR) 0.3 mg/24hr patch Place 1 patch onto the skin once a week.      . divalproex (DEPAKOTE SPRINKLE) 125 MG capsule Take 250 mg by mouth 2 (two) times daily.       . DULoxetine (CYMBALTA) 30 MG capsule Take 30 mg by mouth daily.      Marland Kitchen erythromycin ethylsuccinate (  EES) 200 MG/5ML suspension Take 250 mg by mouth every 6 (six) hours.      . hydrALAZINE (APRESOLINE) 50 MG tablet Take 50 mg by mouth every 6 (six) hours. Also 50 mg twice daily for b/p>160/90 as needed      . isosorbide dinitrate (ISORDIL) 40 MG tablet Take 80 mg by mouth 3 (three) times daily.      Marland Kitchen levETIRAcetam (KEPPRA) 500 MG tablet Take 500 mg by mouth every 12 (twelve) hours.      Marland Kitchen losartan (COZAAR) 50 MG tablet Take 100 mg by mouth daily.      . metFORMIN (GLUCOPHAGE) 500 MG tablet Take 500 mg by mouth 2 (two) times daily with a meal.      . metoprolol tartrate (LOPRESSOR) 25 MG tablet Take 1 tablet (25 mg total) by mouth every 6 (six) hours.  120 tablet  3  . omeprazole (PRILOSEC) 20 MG capsule Take 20 mg by mouth daily.      .  polyethylene glycol (MIRALAX / GLYCOLAX) packet Take 17 g by mouth 2 (two) times daily.      . polyvinyl alcohol (LIQUIFILM TEARS) 1.4 % ophthalmic solution Place 1 drop into both eyes 2 (two) times daily.      . Water For Irrigation, Sterile (STERILE WATER FOR IRRIGATION) Irrigate with 100 mLs as directed every 8 (eight) hours.        No current facility-administered medications on file prior to visit.     No Known Allergies  ROS:  Out of a complete 14 system review of symptoms, the patient complains only of the following symptoms, and all other reviewed systems are negative.  Shortness of breath, wheezing Incontinence of bowel Feeling hot, increased thirst, flushing Joint pain Confusion, headache, numbness, weakness, slurred speech, dizziness, tremor Depression, anxiety, decreased energy, disinterest in activities Restless legs  Blood pressure 102/70, pulse 60, weight 163 lb (73.936 kg).  Physical Exam  General: The patient is alert and cooperative at the time of the examination.  Eyes: Pupils are equal, round, and reactive to light. Discs are flat bilaterally.  Neck: The neck is supple, no carotid bruits are noted.  Respiratory: The respiratory examination is clear.  Cardiovascular: The cardiovascular examination reveals a regular rate and rhythm, no obvious murmurs or rubs are noted.  Skin: Extremities are without significant edema.  Neurologic Exam  Mental status: The patient is alert, but is aphasic.  Cranial nerves: Facial symmetry is present. There is good sensation of the face to pinprick and soft touch bilaterally. The strength of the facial muscles and the muscles to head turning and shoulder shrug are normal bilaterally. Speech is aphasic, slightly dysarthric. The patient is able to state his name. Extraocular movements are full. Visual fields are difficult to test, the patient does not blink well to threat from either side. The patient appears to have significant  apraxia with the use of the tongue, unable to extend and stick out the tongue. No obvious hearing deficits are noted.  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 The patient has minimally voluntary use of the right arm or right leg, the patient is able to raise up the left arm slightly, to about 80. The patient has some grip bilaterally, with 4/5 strength with flexion-extension elbow on the left, and with the left deltoid. The patient has minimal use of the right arm. The patient has some ability to raise  both legs with hip flexion, left greater than right. The patient has high extensor tone the knees bilaterally. Weakness with dorsiflexion the feet is seen bilaterally, left side is stronger. Increased motor tone is noted on all 4 extremities.  Sensory: Sensory testing is difficult, but the patient appears to have sensation to pinprick on all fours symmetrically, indicates he can feel vibration sensation bilaterally.  Coordination: Cerebellar testing is difficult, the patient has not effectively able to perform heel-to-shin or finger-nose-finger on either side.  Gait and station: Gait cannot be tested, the patient is wheelchair-bound, nonambulatory.  Reflexes: Deep tendon reflexes are symmetric, but somewhat brisk throughout. Toes are neutral bilaterally.    MRI brain 09/24/12:  IMPRESSION:  Probable small acute infarct in the deep white matter on the left.  Extensive chronic ischemic change. Late subacute hematoma left  parietal lobe measures 45 x 38 mm. There are areas of chronic  hemorrhage bilaterally.    Assessment/Plan:  One. History cerebrovascular disease, bicerebral strokes  2. Recent decline in function of right side  The patient may have sustained another left brain stroke. The patient has extensive cerebrovascular disease by prior MRI and CT evaluation. The patient will be set up for a MRI of  the brain, and this will be compared to the prior study done in September 2014. The patient will undergo an EEG study. He will followup in about 4 months.  Marlan Palau MD 10/03/2013 8:18 PM  [facility] [address] [place], [place] [address]  Phone [phone] Fax [phone]

## 2013-10-03 NOTE — Patient Instructions (Signed)

## 2013-10-10 ENCOUNTER — Ambulatory Visit (INDEPENDENT_AMBULATORY_CARE_PROVIDER_SITE_OTHER): Payer: PRIVATE HEALTH INSURANCE

## 2013-10-10 DIAGNOSIS — I6990 Unspecified sequelae of unspecified cerebrovascular disease: Secondary | ICD-10-CM

## 2013-10-10 DIAGNOSIS — R269 Unspecified abnormalities of gait and mobility: Secondary | ICD-10-CM

## 2013-10-11 ENCOUNTER — Telehealth: Payer: Self-pay | Admitting: Neurology

## 2013-10-11 NOTE — Procedures (Signed)
      [name] is a 58 year old gentleman with a history of bicerebral strokes. He has sustained an alteration in clinical functioning of his right side within the last several months. The patient being evaluated for this issue.  This is a routine EEG. No skull defects are noted. Medications include aspirin, Lipitor, chlorthalidone, Catapres, clonazepam, Depakote, Cymbalta, hydralazine, isosorbide dinitrate, Keppra, losartan, metformin, metoprolol, Prilosec, MiraLax, and Senokot.  EEG classification: Dysrhythmia grade 1 generalized  Description of the recording: The background rhythms of this recording consists of a moderately well modulated medium amplitude theta frequency activity of 7 Hz that is reactive to eye opening and closure. As the record progresses, hyperventilation was not performed. Photic stimulation was performed, and results in a minimal but bilateral photic driving response. The persistent theta activity is seen throughout the recording, and there is no evidence of focal slowing seen at any time. At no time does there appear to be evidence of spike or spike wave discharges. EKG monitor shows no evidence of cardiac rhythm abnormalities with a heart rate of 60.  Impression: This is an abnormal EEG recording secondary to diffuse theta frequency slowing. This is a nonspecific recording, and can be seen with any process that results in a toxic or metabolic encephalopathy or any dementing illness. In this patient, bilateral slowing may be related to extensive bicerebral ischemic changes. Clinical correlation is required. No evidence of epileptiform discharges were seen.

## 2013-10-11 NOTE — Telephone Encounter (Signed)
I called concerning the EEG or study results. There is diffuse nonspecific theta slowing, no evidence of epileptiform discharges.

## 2013-10-29 ENCOUNTER — Non-Acute Institutional Stay (SKILLED_NURSING_FACILITY): Payer: PRIVATE HEALTH INSURANCE | Admitting: Internal Medicine

## 2013-10-29 DIAGNOSIS — R531 Weakness: Secondary | ICD-10-CM

## 2013-10-29 DIAGNOSIS — I10 Essential (primary) hypertension: Secondary | ICD-10-CM

## 2013-10-29 DIAGNOSIS — E1149 Type 2 diabetes mellitus with other diabetic neurological complication: Secondary | ICD-10-CM

## 2013-10-29 DIAGNOSIS — I6990 Unspecified sequelae of unspecified cerebrovascular disease: Secondary | ICD-10-CM

## 2013-11-06 NOTE — Progress Notes (Addendum)
Patient ID: [name], male   DOB: 03-Nov-1955, 58 y.o.   MRN: [id number]               PROGRESS NOTE  DATE:  10/29/2013    FACILITY: [facility]    LEVEL OF CARE:   SNF   Routine Visit   CHIEF COMPLAINT:  Follow-up of medical issues and [employer] routine visit.    HISTORY OF PRESENT ILLNESS:  This is a 58 year-old man who has a known history of bi-hemispheric strokes.  Over the last 2-3 months, he has had progressive decline in function including inability to raise either arm, which was clearly new on both sides.  His original stroke was in the dominant hemisphere.  I think he had another one in the opposite side at some point.  Nevertheless, it was not clear to me that this even represented an upper motor neuron pathology.    He saw Dr. Anne HahnWillis in consultation from Neurology.  He felt this was clearly upper motor neuron.  He ordered an EEG which showed no epileptiform discharges.  He was supposed to have an MRI, although I do not see that this was ever done.    PHYSICAL EXAMINATION:   VITAL SIGNS:   WEIGHT:  165 pounds, which is stable.   O2 SATURATIONS:  Stable, as well, at 96% on room air.    TEMPERATURE:  Afebrile.   PULSE:  62.   RESPIRATIONS:  18.   BLOOD PRESSURE:  150/60.   GENERAL APPEARANCE:  The patient does not look to be in any distress.   CHEST/RESPIRATORY:  Clear air entry bilaterally.   CARDIOVASCULAR:  CARDIAC:   Heart sounds are normal.  There are no murmurs.  He appears to be euvolemic.   NEUROLOGICAL:    SENSATION/STRENGTH:  He is essentially quadriparetic at this point.  He is developing contractures bilaterally, especially in his upper extremities.   DEEP TENDON REFLEXES:  His reflexes seem 1+ in the upper extremities, absent at the knee jerks. OTHER:  He seems to me to have difficulty holding his head up now, which is something that I was not used to seeing several months ago.    ASSESSMENT/PLAN:    Multiple cerebral strokes.  The patient initially had a  stroke in 2009 and suffered another stroke in 2014.  The exact order of things, I am uncertain of, although at least one of them he recovered from.  A second one left him with right hemiparesis and cortically-based aphasia.  He is due to have an MRI which should answer the question about whether he has had additional cortically-based damage.  I am not convinced this is the etiology of the decline.   Hypertension.  This is under satisfactory control.    Stage III chronic renal failure in the setting of diabetes.  This also seems to be stable.  His A1c was 6.3 on 07/29/2013.  He is on Glucophage.    We are awaiting the MRI to establish definitively whether this is a recurrent central damage stroke or other pathology.  An EEG has been negative.

## 2013-11-19 ENCOUNTER — Telehealth: Payer: Self-pay

## 2013-11-19 NOTE — Telephone Encounter (Signed)
Kristal called from [facility] requesting EEG results for NP. Faxed notes.

## 2013-11-26 ENCOUNTER — Non-Acute Institutional Stay (SKILLED_NURSING_FACILITY): Payer: PRIVATE HEALTH INSURANCE | Admitting: Internal Medicine

## 2013-11-26 DIAGNOSIS — R569 Unspecified convulsions: Secondary | ICD-10-CM

## 2013-11-26 DIAGNOSIS — I6990 Unspecified sequelae of unspecified cerebrovascular disease: Secondary | ICD-10-CM

## 2013-11-26 DIAGNOSIS — E1121 Type 2 diabetes mellitus with diabetic nephropathy: Secondary | ICD-10-CM

## 2013-11-28 NOTE — Progress Notes (Addendum)
Patient ID: [name], male   DOB: January 22, 1955, 58 y.o.   MRN: [id number]              PROGRESS NOTE  DATE:  11/26/2013     FACILITY: [facility]    LEVEL OF CARE:   SNF   Routine Visit   CHIEF COMPLAINT:  Review of general medical issues, [employer] visit.    HISTORY OF PRESENT ILLNESS:  This is a 58 year-old man who has a history of bihemispheric strokes and depression.  I think he had an initial right hemisphere event that he recovered from.  Subsequently, he had a dominant hemisphere event which has left him with right hemiparesis and a cortically-based aphasia.    Nevertheless, he took a sudden functional decline several months ago to the point where he is no longer able to lift his arms.  He seems to me to have difficulty with even holding his head up in the chair.  He is followed by Psychiatry here, with the issue being that some of this decline was secondary to depression.    He has followed up with Dr. Anne HahnWillis.  He had an MRI of the brain on 09/24/2013 that showed a left parietal hematoma measuring 45 x 38 mm.  This is not a new phenomenon, having been present on 07/03/2012 with slow resolution.  There is also chronic hemorrhage in the left frontal lobe and right parietal lobe with extensive chronic ischemic change including a chronic infarct in the pons bilaterally, left greater than right, chronic infarct in the thalamus bilaterally, and chronic microvascular ischemic change in the white matter, right greater than left.  There was, however, a small area of new infarct in the deep white matter on the left measuring under 1 cm.    There have been no recent major changes.  He is certainly not improved.    CURRENT MEDICATIONS:  Medication list is reviewed.     Catapres-TTS patch 0.3/24 hr.     ASA 81 q.d.     Prilosec 20 q.d.     Senna 8.6 daily.    Chlorthalidone 12.5 daily.    Cymbalta 30 q.d.    MiraLAX 17 g q.d.    Klonopin 0.5 every 12 hours.     Keppra 250 b.i.d.      Depakote Sprinkles 125, 3 capsules/375 b.i.d.    Isordil 40 mg x2 tablets/80 mg three times a day.    Cozaar 100 q.d.    Hydralazine 100 three times a day.    Lopressor 25 q.6.    PHYSICAL EXAMINATION:   VITAL SIGNS:   WEIGHT:  165 pounds.   BLOOD PRESSURE:   104/60.   CHEST/RESPIRATORY:  Clear air entry bilaterally.   CARDIOVASCULAR:  CARDIAC:   Heart sounds are normal.  He appears to be euvolemic.   NEUROLOGICAL:   Will occasionally say the odd yes or no, but he does not consistently follow this.   SENSATION/STRENGTH:  He has bilateral upper extremity weakness which is fairly significant.    ASSESSMENT/PLAN:    Severe bilateral cerebrovascular disease.  I am not certain whether this adequately explains his tremendous decline in the last few months.  However, in view of this, it is difficult to envision aggressively investigating this more.  He does have a new area of infarction as described above.  He is noted to have areas of chronic hemorrhage bilaterally.  Whether this is secondary to severe chronic hypertension versus amyloid angiopathy, etc., I am  unsure.  However, this appears to be a moot point.    Depression.    Microcytic, hypochromic anemia.  However, this appears to be stable.    Hypertension.  Appears to be under stable control.    Stage III chronic renal failure in the setting of diabetes.  This is under reasonable control, as well.

## 2013-12-04 ENCOUNTER — Other Ambulatory Visit: Payer: Self-pay

## 2013-12-04 MED ORDER — CLONAZEPAM 0.5 MG PO TABS: ORAL_TABLET | ORAL | Status: DC

## 2013-12-04 NOTE — Telephone Encounter (Signed)
Rx faxed to [facility] @ [phone], phone number [phone]  

## 2014-01-06 ENCOUNTER — Ambulatory Visit: Payer: PRIVATE HEALTH INSURANCE | Admitting: Nurse Practitioner

## 2014-01-31 ENCOUNTER — Encounter (HOSPITAL_COMMUNITY): Payer: Self-pay | Admitting: Emergency Medicine

## 2014-01-31 ENCOUNTER — Inpatient Hospital Stay (HOSPITAL_COMMUNITY)
Admission: EM | Admit: 2014-01-31 | Discharge: 2014-02-05 | DRG: 281 | Disposition: A | Discharge status: Hospice | Payer: Medicare Other | Attending: Internal Medicine | Admitting: Internal Medicine

## 2014-01-31 ENCOUNTER — Emergency Department (HOSPITAL_COMMUNITY): Payer: Medicare Other

## 2014-01-31 DIAGNOSIS — R4701 Aphasia: Secondary | ICD-10-CM | POA: Diagnosis present

## 2014-01-31 DIAGNOSIS — Z955 Presence of coronary angioplasty implant and graft: Secondary | ICD-10-CM

## 2014-01-31 DIAGNOSIS — I10 Essential (primary) hypertension: Secondary | ICD-10-CM

## 2014-01-31 DIAGNOSIS — I6990 Unspecified sequelae of unspecified cerebrovascular disease: Secondary | ICD-10-CM

## 2014-01-31 DIAGNOSIS — R9431 Abnormal electrocardiogram [ECG] [EKG]: Secondary | ICD-10-CM

## 2014-01-31 DIAGNOSIS — I69320 Aphasia following cerebral infarction: Secondary | ICD-10-CM

## 2014-01-31 DIAGNOSIS — I214 Non-ST elevation (NSTEMI) myocardial infarction: Secondary | ICD-10-CM | POA: Diagnosis not present

## 2014-01-31 DIAGNOSIS — R079 Chest pain, unspecified: Secondary | ICD-10-CM

## 2014-01-31 DIAGNOSIS — K5900 Constipation, unspecified: Secondary | ICD-10-CM | POA: Diagnosis present

## 2014-01-31 DIAGNOSIS — I2510 Atherosclerotic heart disease of native coronary artery without angina pectoris: Secondary | ICD-10-CM | POA: Diagnosis present

## 2014-01-31 DIAGNOSIS — Z66 Do not resuscitate: Secondary | ICD-10-CM | POA: Diagnosis not present

## 2014-01-31 DIAGNOSIS — R451 Restlessness and agitation: Secondary | ICD-10-CM

## 2014-01-31 DIAGNOSIS — E876 Hypokalemia: Secondary | ICD-10-CM | POA: Diagnosis present

## 2014-01-31 DIAGNOSIS — Z7982 Long term (current) use of aspirin: Secondary | ICD-10-CM

## 2014-01-31 DIAGNOSIS — I160 Hypertensive urgency: Secondary | ICD-10-CM

## 2014-01-31 DIAGNOSIS — R4589 Other symptoms and signs involving emotional state: Secondary | ICD-10-CM

## 2014-01-31 DIAGNOSIS — I471 Supraventricular tachycardia, unspecified: Secondary | ICD-10-CM | POA: Diagnosis not present

## 2014-01-31 DIAGNOSIS — Z79899 Other long term (current) drug therapy: Secondary | ICD-10-CM

## 2014-01-31 DIAGNOSIS — R4702 Dysphasia: Secondary | ICD-10-CM | POA: Diagnosis present

## 2014-01-31 DIAGNOSIS — R630 Anorexia: Secondary | ICD-10-CM

## 2014-01-31 DIAGNOSIS — E872 Acidosis: Secondary | ICD-10-CM | POA: Diagnosis present

## 2014-01-31 DIAGNOSIS — I255 Ischemic cardiomyopathy: Secondary | ICD-10-CM | POA: Diagnosis present

## 2014-01-31 DIAGNOSIS — N179 Acute kidney failure, unspecified: Secondary | ICD-10-CM | POA: Diagnosis present

## 2014-01-31 DIAGNOSIS — I6930 Unspecified sequelae of cerebral infarction: Secondary | ICD-10-CM

## 2014-01-31 DIAGNOSIS — Z515 Encounter for palliative care: Secondary | ICD-10-CM

## 2014-01-31 DIAGNOSIS — Z7401 Bed confinement status: Secondary | ICD-10-CM

## 2014-01-31 DIAGNOSIS — R7989 Other specified abnormal findings of blood chemistry: Secondary | ICD-10-CM

## 2014-01-31 DIAGNOSIS — R400 Somnolence: Secondary | ICD-10-CM | POA: Diagnosis present

## 2014-01-31 DIAGNOSIS — E869 Volume depletion, unspecified: Secondary | ICD-10-CM

## 2014-01-31 DIAGNOSIS — N390 Urinary tract infection, site not specified: Secondary | ICD-10-CM | POA: Diagnosis present

## 2014-01-31 DIAGNOSIS — E1149 Type 2 diabetes mellitus with other diabetic neurological complication: Secondary | ICD-10-CM | POA: Diagnosis present

## 2014-01-31 LAB — URINALYSIS, ROUTINE W REFLEX MICROSCOPIC
Bilirubin Urine: NEGATIVE
Glucose, UA: 100 mg/dL — AB
KETONES UR: NEGATIVE mg/dL
Nitrite: POSITIVE — AB
PROTEIN: 30 mg/dL — AB
SPECIFIC GRAVITY, URINE: 1.017 (ref 1.005–1.030)
UROBILINOGEN UA: 0.2 mg/dL (ref 0.0–1.0)
pH: 8 (ref 5.0–8.0)

## 2014-01-31 LAB — BASIC METABOLIC PANEL
ANION GAP: 15 (ref 5–15)
BUN: 26 mg/dL — ABNORMAL HIGH (ref 6–23)
CHLORIDE: 100 meq/L (ref 96–112)
CO2: 23 mmol/L (ref 19–32)
Calcium: 9.5 mg/dL (ref 8.4–10.5)
Creatinine, Ser: 1.87 mg/dL — ABNORMAL HIGH (ref 0.50–1.35)
GFR calc non Af Amer: 38 mL/min — ABNORMAL LOW (ref >90)
GFR, EST AFRICAN AMERICAN: 44 mL/min — AB (ref >90)
GLUCOSE: 182 mg/dL — AB (ref 70–99)
Potassium: 4.9 mmol/L (ref 3.5–5.1)
Sodium: 138 mmol/L (ref 135–145)

## 2014-01-31 LAB — I-STAT TROPONIN, ED: Troponin i, poc: 0.06 ng/mL (ref 0.00–0.08)

## 2014-01-31 LAB — CBC
HEMATOCRIT: 40.1 % (ref 39.0–52.0)
HEMOGLOBIN: 13.5 g/dL (ref 13.0–17.0)
MCH: 26.7 pg (ref 26.0–34.0)
MCHC: 33.7 g/dL (ref 30.0–36.0)
MCV: 79.2 fL (ref 78.0–100.0)
Platelets: 203 10*3/uL (ref 150–400)
RBC: 5.06 MIL/uL (ref 4.22–5.81)
RDW: 15.8 % — ABNORMAL HIGH (ref 11.5–15.5)
WBC: 8.2 10*3/uL (ref 4.0–10.5)

## 2014-01-31 LAB — TSH: TSH: 2.761 u[IU]/mL (ref 0.350–4.500)

## 2014-01-31 LAB — I-STAT CG4 LACTIC ACID, ED: LACTIC ACID, VENOUS: 4.58 mmol/L — AB (ref 0.5–2.0)

## 2014-01-31 LAB — HEPATIC FUNCTION PANEL
ALBUMIN: 3.8 g/dL (ref 3.5–5.2)
ALT: 28 U/L (ref 0–53)
AST: 36 U/L (ref 0–37)
Alkaline Phosphatase: 69 U/L (ref 39–117)
BILIRUBIN DIRECT: 0.2 mg/dL (ref 0.0–0.5)
BILIRUBIN TOTAL: 0.7 mg/dL (ref 0.3–1.2)
Indirect Bilirubin: 0.5 mg/dL (ref 0.3–0.9)
Total Protein: 7.7 g/dL (ref 6.0–8.3)

## 2014-01-31 LAB — URINE MICROSCOPIC-ADD ON

## 2014-01-31 LAB — HEPARIN LEVEL (UNFRACTIONATED): Heparin Unfractionated: 0.79 IU/mL — ABNORMAL HIGH (ref 0.30–0.70)

## 2014-01-31 LAB — HEMOGLOBIN A1C
HEMOGLOBIN A1C: 6.6 % — AB (ref <5.7)
Mean Plasma Glucose: 143 mg/dL — ABNORMAL HIGH (ref <117)

## 2014-01-31 LAB — TROPONIN I
Troponin I: 0.71 ng/mL (ref <0.031)
Troponin I: 16.09 ng/mL (ref <0.031)

## 2014-01-31 LAB — LIPASE, BLOOD: Lipase: 49 U/L (ref 11–59)

## 2014-01-31 LAB — MRSA PCR SCREENING: MRSA by PCR: NEGATIVE

## 2014-01-31 MED ORDER — ASPIRIN 81 MG PO CHEW
324.0000 mg | CHEWABLE_TABLET | Freq: Once | ORAL | Status: AC
  Administered 2014-01-31: 324 mg via ORAL

## 2014-01-31 MED ORDER — SODIUM CHLORIDE 0.9 % IV BOLUS (SEPSIS)
1000.0000 mL | Freq: Once | INTRAVENOUS | Status: AC
Start: 2014-01-31 — End: 2014-01-31
  Administered 2014-01-31: 1000 mL via INTRAVENOUS

## 2014-01-31 MED ORDER — NITROGLYCERIN IN D5W 200-5 MCG/ML-% IV SOLN
2.0000 ug/min | INTRAVENOUS | Status: DC
Start: 2014-01-31 — End: 2014-02-02
  Administered 2014-01-31: 5 ug/min via INTRAVENOUS
  Filled 2014-01-31: qty 250

## 2014-01-31 MED ORDER — POLYVINYL ALCOHOL 1.4 % OP SOLN
1.0000 [drp] | Freq: Two times a day (BID) | OPHTHALMIC | Status: DC
  Administered 2014-01-31 – 2014-02-04 (×9): 1 [drp] via OPHTHALMIC
  Filled 2014-01-31: qty 15

## 2014-01-31 MED ORDER — HYDRALAZINE HCL 50 MG PO TABS
50.0000 mg | ORAL_TABLET | Freq: Four times a day (QID) | ORAL | Status: DC
  Administered 2014-01-31 – 2014-02-02 (×10): 50 mg via ORAL
  Filled 2014-01-31 (×4): qty 1
  Filled 2014-01-31: qty 2
  Filled 2014-01-31 (×4): qty 1
  Filled 2014-01-31: qty 2
  Filled 2014-01-31 (×2): qty 1

## 2014-01-31 MED ORDER — ISOSORBIDE DINITRATE 10 MG PO TABS: 80.0000 mg | ORAL_TABLET | Freq: Three times a day (TID) | ORAL | Status: DC

## 2014-01-31 MED ORDER — SENNOSIDES-DOCUSATE SODIUM 8.6-50 MG PO TABS
1.0000 | ORAL_TABLET | Freq: Every day | ORAL | Status: DC
  Administered 2014-01-31 – 2014-02-02 (×3): 1 via ORAL
  Filled 2014-01-31 (×10): qty 1

## 2014-01-31 MED ORDER — CLONAZEPAM 0.5 MG PO TABS
0.5000 mg | ORAL_TABLET | Freq: Two times a day (BID) | ORAL | Status: DC | PRN
  Administered 2014-02-01 (×2): 0.5 mg via ORAL
  Filled 2014-01-31 (×2): qty 1

## 2014-01-31 MED ORDER — ATORVASTATIN CALCIUM 10 MG PO TABS
10.0000 mg | ORAL_TABLET | Freq: Every day | ORAL | Status: DC
Start: 2014-01-31 — End: 2014-02-05
  Administered 2014-01-31 – 2014-02-04 (×4): 10 mg via ORAL
  Filled 2014-01-31 (×6): qty 1

## 2014-01-31 MED ORDER — SODIUM CHLORIDE 0.9 % IV SOLN
INTRAVENOUS | Status: DC
  Administered 2014-01-31: 100 mL/h via INTRAVENOUS
  Administered 2014-01-31 – 2014-02-01 (×2): 100 mL via INTRAVENOUS
  Administered 2014-02-02: 100 mL/h via INTRAVENOUS

## 2014-01-31 MED ORDER — LEVETIRACETAM 250 MG PO TABS
250.0000 mg | ORAL_TABLET | Freq: Two times a day (BID) | ORAL | Status: DC
  Administered 2014-01-31 – 2014-02-02 (×6): 250 mg via ORAL
  Filled 2014-01-31 (×8): qty 1

## 2014-01-31 MED ORDER — POLYETHYLENE GLYCOL 3350 17 G PO PACK
17.0000 g | PACK | Freq: Every day | ORAL | Status: DC
  Administered 2014-01-31 – 2014-02-02 (×3): 17 g via ORAL
  Filled 2014-01-31 (×5): qty 1

## 2014-01-31 MED ORDER — MORPHINE SULFATE 2 MG/ML IJ SOLN
2.0000 mg | INTRAMUSCULAR | Status: DC | PRN
  Administered 2014-01-31 – 2014-02-04 (×19): 2 mg via INTRAVENOUS
  Filled 2014-01-31 (×19): qty 1

## 2014-01-31 MED ORDER — PANTOPRAZOLE SODIUM 40 MG PO TBEC
80.0000 mg | DELAYED_RELEASE_TABLET | Freq: Every day | ORAL | Status: DC
  Administered 2014-01-31 – 2014-02-02 (×3): 80 mg via ORAL
  Filled 2014-01-31 (×3): qty 2

## 2014-01-31 MED ORDER — ASPIRIN 81 MG PO CHEW: 81.0000 mg | CHEWABLE_TABLET | Freq: Every day | ORAL | Status: DC

## 2014-01-31 MED ORDER — ONDANSETRON HCL 4 MG/2ML IJ SOLN: 4.0000 mg | Freq: Four times a day (QID) | INTRAMUSCULAR | Status: DC | PRN

## 2014-01-31 MED ORDER — HEPARIN SODIUM (PORCINE) 5000 UNIT/ML IJ SOLN
5000.0000 [IU] | Freq: Three times a day (TID) | INTRAMUSCULAR | Status: DC
  Administered 2014-01-31 (×2): 5000 [IU] via SUBCUTANEOUS
  Filled 2014-01-31 (×2): qty 1

## 2014-01-31 MED ORDER — CLONIDINE HCL 0.3 MG/24HR TD PTWK
0.3000 mg | MEDICATED_PATCH | TRANSDERMAL | Status: DC
  Administered 2014-01-31: 0.3 mg via TRANSDERMAL
  Filled 2014-01-31: qty 1

## 2014-01-31 MED ORDER — MORPHINE SULFATE 4 MG/ML IJ SOLN
4.0000 mg | Freq: Once | INTRAMUSCULAR | Status: AC
  Administered 2014-01-31: 4 mg via INTRAVENOUS
  Filled 2014-01-31: qty 1

## 2014-01-31 MED ORDER — HEPARIN (PORCINE) IN NACL 100-0.45 UNIT/ML-% IJ SOLN
900.0000 [IU]/h | INTRAMUSCULAR | Status: DC
  Administered 2014-01-31: 1000 [IU]/h via INTRAVENOUS
  Filled 2014-01-31: qty 250

## 2014-01-31 MED ORDER — ASPIRIN EC 81 MG PO TBEC
81.0000 mg | DELAYED_RELEASE_TABLET | Freq: Every day | ORAL | Status: DC
  Administered 2014-01-31 – 2014-02-01 (×2): 81 mg via ORAL
  Filled 2014-01-31 (×2): qty 1

## 2014-01-31 MED ORDER — METOPROLOL TARTRATE 25 MG PO TABS
25.0000 mg | ORAL_TABLET | Freq: Four times a day (QID) | ORAL | Status: DC
  Administered 2014-01-31 – 2014-02-01 (×5): 25 mg via ORAL
  Filled 2014-01-31 (×5): qty 1

## 2014-01-31 MED ORDER — DULOXETINE HCL 30 MG PO CPEP
30.0000 mg | ORAL_CAPSULE | Freq: Every day | ORAL | Status: DC
  Administered 2014-01-31 – 2014-02-04 (×4): 30 mg via ORAL
  Filled 2014-01-31 (×6): qty 1

## 2014-01-31 MED ORDER — ASPIRIN 81 MG PO CHEW
81.0000 mg | CHEWABLE_TABLET | Freq: Once | ORAL | Status: DC
  Filled 2014-01-31: qty 1

## 2014-01-31 MED ORDER — SODIUM CHLORIDE 0.9 % IJ SOLN
3.0000 mL | Freq: Two times a day (BID) | INTRAMUSCULAR | Status: DC
  Administered 2014-01-31 – 2014-02-04 (×7): 3 mL via INTRAVENOUS

## 2014-01-31 MED ORDER — DIVALPROEX SODIUM 125 MG PO CPSP
375.0000 mg | ORAL_CAPSULE | Freq: Two times a day (BID) | ORAL | Status: DC
  Administered 2014-01-31 – 2014-02-02 (×6): 375 mg via ORAL
  Filled 2014-01-31 (×8): qty 3

## 2014-01-31 MED ORDER — BISACODYL 10 MG RE SUPP
10.0000 mg | Freq: Once | RECTAL | Status: AC
  Administered 2014-01-31: 10 mg via RECTAL
  Filled 2014-01-31: qty 1

## 2014-01-31 MED ORDER — ONDANSETRON HCL 4 MG PO TABS: 4.0000 mg | ORAL_TABLET | Freq: Four times a day (QID) | ORAL | Status: DC | PRN

## 2014-01-31 MED ORDER — VITAMIN D (ERGOCALCIFEROL) 1.25 MG (50000 UNIT) PO CAPS: 50000.0000 [IU] | ORAL_CAPSULE | ORAL | Status: DC

## 2014-01-31 NOTE — Progress Notes (Signed)
ANTICOAGULATION CONSULT NOTE - Initial Consult  Pharmacy Consult for heparin Indication: chest pain/ACS  No Known Allergies  Patient Measurements: Height: 5\' 7"  (170.2 cm) Weight: 157 lb 6.4 oz (71.396 kg) IBW/kg (Calculated) : 66.1  Vital Signs: Temp: 98.6 F (37 C) (01/22 1321) Temp Source: Oral (01/22 1321) BP: 167/88 mmHg (01/22 1446) Pulse Rate: 80 (01/22 1345)  Labs:  Recent Labs  01/31/14 0310 01/31/14 0410 01/31/14 0945 01/31/14 1410  HGB  --  13.5  --   --   HCT  --  40.1  --   --   PLT  --  203  --   --   CREATININE 1.87*  --   --   --   TROPONINI  --   --  0.71* 3.14*    Estimated Creatinine Clearance: 40.3 mL/min (by C-G formula based on Cr of 1.87).   Medical History: Past Medical History  Diagnosis Date  . Stroke 2007  . Hypertension   . Difficult airway for intubation 06/22/2012    Deep and anterior. Difficult with standard laryngoscope. Easily intubated with Glidescope   . Coronary artery disease     Stent placement  . Malignant hypertension 07/14/2012  . 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 MSSA (methicillin susceptible Staphylococcus aureus) pneumonia 07/03/2012  . CVA (cerebral infarction) 06/22/2012  . Seizure, possible 06/22/2012  . Anxiety state, unspecified   . Zygomatic hyperplasia   . Abnormality of gait 10/03/2013    Medications:  Prescriptions prior to admission  Medication Sig Dispense Refill Last Dose  . acetaminophen (TYLENOL) 325 MG tablet Give 650 mg by tube every 6 (six) hours as needed (elevated temperature).   01/31/2014 at Unknown time  . aspirin 81 MG chewable tablet Chew 81 mg by mouth daily.   01/30/2014 at Unknown time  . chlorthalidone (HYGROTON) 25 MG tablet Take 12.5 mg by mouth daily.   01/30/2014 at Unknown time  . clonazePAM (KLONOPIN) 0.5 MG tablet Take one tablet by mouth every 12 hours for depression 60 tablet 5 01/30/2014 at Unknown time  . cloNIDine (CATAPRES - DOSED IN MG/24 HR) 0.3 mg/24hr patch  Place 1 patch onto the skin once a week.   01/24/2014  . divalproex (DEPAKOTE SPRINKLE) 125 MG capsule Take 375 mg by mouth 2 (two) times daily.    01/30/2014 at Unknown time  . DULoxetine (CYMBALTA) 30 MG capsule Take 30 mg by mouth daily.   01/30/2014 at Unknown time  . hydrALAZINE (APRESOLINE) 50 MG tablet Take 50 mg by mouth every 6 (six) hours. Also 50 mg twice daily for b/p>160/90 as needed   01/30/2014 at Unknown time  . isosorbide dinitrate (ISORDIL) 40 MG tablet Take 80 mg by mouth 3 (three) times daily.   01/30/2014 at Unknown time  . levETIRAcetam (KEPPRA) 500 MG tablet Take 250 mg by mouth every 12 (twelve) hours.    01/30/2014 at Unknown time  . losartan (COZAAR) 50 MG tablet Take 100 mg by mouth daily.   01/30/2014 at Unknown time  . metFORMIN (GLUCOPHAGE) 500 MG tablet Take 500 mg by mouth 2 (two) times daily with a meal.   01/30/2014 at Unknown time  . metoprolol tartrate (LOPRESSOR) 25 MG tablet Take 1 tablet (25 mg total) by mouth every 6 (six) hours. 120 tablet 3 01/30/2014 at 2400  . omeprazole (PRILOSEC) 20 MG capsule Take 20 mg by mouth daily.   01/30/2014 at Unknown time  . polyethylene glycol (MIRALAX / GLYCOLAX) packet Take 17 g  by mouth 2 (two) times daily.   01/30/2014 at Unknown time  . polyvinyl alcohol (LIQUIFILM TEARS) 1.4 % ophthalmic solution Place 1 drop into both eyes 2 (two) times daily.   01/30/2014 at Unknown time  . sennosides-docusate sodium (SENOKOT-S) 8.6-50 MG tablet Take 1 tablet by mouth daily.   01/30/2014 at Unknown time  . Vitamin D, Ergocalciferol, (DRISDOL) 50000 UNITS CAPS capsule Take 50,000 Units by mouth every 30 (thirty) days.   01/16/14   Scheduled:  . aspirin EC  81 mg Oral Daily  . atorvastatin  10 mg Oral Daily  . cloNIDine  0.3 mg Transdermal Q Fri  . divalproex  375 mg Oral BID  . DULoxetine  30 mg Oral Daily  . heparin  5,000 Units Subcutaneous 3 times per day  . hydrALAZINE  50 mg Oral Q6H  . levETIRAcetam  250 mg Oral Q12H  . metoprolol  tartrate  25 mg Oral Q6H  . pantoprazole  80 mg Oral Daily  . polyethylene glycol  17 g Oral Daily  . polyvinyl alcohol  1 drop Both Eyes BID  . senna-docusate  1 tablet Oral Daily  . sodium chloride  3 mL Intravenous Q12H  . [START ON 02/16/2014] Vitamin D (Ergocalciferol)  50,000 Units Oral Q30 days    Assessment: 59 yo male here with CP (he is noted with history of  PCI with stent) to begin heparin for r/o ACS (max troponin= 3.14).  He is noted on heparin sq and last dose was today at 3pm.  Goal of Therapy:  Heparin level 0.3-0.7 units/ml Monitor platelets by anticoagulation protocol: Yes   Plan:  -No heparin bolus  -Begin heparin infusion at 1000 units/hr (~ 14 units/kg/hr) -Heparin level in 6 hours and daily wth CBC daily  Harland German, Pharm D 01/31/2014 3:45 PM

## 2014-01-31 NOTE — Consult Note (Signed)
CARDIOLOGY CONSULT NOTE       Patient ID: ADELARD SANON MRN: [id number] DOB/AGE: 18-Jun-1955 59 y.o.  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  Wife brought him to ER because she thought he was in more pain.  No localization just grunting more. Previously seen by Dr Shana Chute in 2002 and 2003.  Had PCI/Stent to OM2 and mid RCA.  Last seen by Dr Algie Coffer in 2013.  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  In ER given MSO4 and nitro.  Enzymes are negative and ECG shows LVH with Strain and lateral ST segment decpression  Review of ECG;s from 6/15 and 9/15 show labile lateral T waves again likely from LVH.  Wife denies fever , respitory distress or vomiting    ROS All other systems reviewed and negative except as noted above  Past Medical History  Diagnosis Date  . Stroke 2007  . Hypertension   . Difficult airway for intubation 06/22/2012    Deep and anterior. Difficult with standard laryngoscope. Easily intubated with Glidescope   . Coronary artery disease     Stent placement  . Malignant hypertension 07/14/2012  . 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 MSSA (methicillin susceptible Staphylococcus aureus) pneumonia 07/03/2012  . CVA (cerebral infarction) 06/22/2012  . Seizure, possible 06/22/2012  . Anxiety state, unspecified   . Zygomatic hyperplasia   . Abnormality of gait 10/03/2013    Family History  Problem Relation Age of Onset  . Hypertension Mother   . Hypertension Father     History   Social History  . Marital Status: Married    Spouse Name: N/A    Number of Children: 3  . Years of Education: MD   Occupational History  . disabled    Social History Main Topics  .  Smoking status: Never Smoker   . Smokeless tobacco: Never Used  . Alcohol Use: No  . Drug Use: No  . Sexual Activity: Not on file   Other Topics Concern  . Not on file   Social History Narrative    Past Surgical History  Procedure Laterality Date  . Coronary stent placement    . Cardiac surgery         . nitroGLYCERIN 5 mcg/min (01/31/14 0434)    Physical Exam: Blood pressure 191/118, pulse 90, temperature 99.2 F (37.3 C), temperature source Rectal, resp. rate 18, height  (1.702 m), weight 71.396 kg (157 lb 6.4 oz), SpO2 100 %.   Non communicative  Chronically ill black male  HEENT: normal Neck supple with no adenopathy JVP normal no bruits no thyromegaly Lungs clear with no wheezing and good diaphragmatic motion Heart:  S1/S2 SEM murmur, no rub, gallop or click PMI normal Abdomen: benighn, BS positve, no tenderness, no AAA no bruit.  No HSM or HJR Distal pulses intact with no bruits No edema Unable to lift arms over head Skin warm and dry LE weakness    Labs:   Lab Results  Component Value Date   WBC 8.2 01/31/2014   HGB 13.5 01/31/2014   HCT 40.1 01/31/2014   MCV 79.2 01/31/2014   PLT 203 01/31/2014    Recent Labs Lab 01/31/14  0310  NA 138  K 4.9  CL 100  CO2 23  BUN 26*  CREATININE 1.87*  CALCIUM 9.5  PROT 7.7  BILITOT 0.7  ALKPHOS 69  ALT 28  AST 36  GLUCOSE 182*   Lab Results  Component Value Date   CKTOTAL 224 05/20/2007   CKMB 2.7 05/18/2007   TROPONINI <0.30 09/24/2012    Lab Results  Component Value Date   CHOL 133 12/12/2012   CHOL 185 06/22/2012   CHOL  05/17/2007    184        ATP III CLASSIFICATION:  <200     mg/dL   Desirable  528-413200-239  mg/dL   Borderline High  >=244>=240    mg/dL   High   Lab Results  Component Value Date   HDL 46 12/12/2012   HDL 58 06/22/2012   HDL 60 05/17/2007   Lab Results  Component Value Date   LDLCALC 71 12/12/2012   LDLCALC 109* 06/22/2012   LDLCALC * 05/17/2007    115          Total Cholesterol/HDL:CHD Risk Coronary Heart Disease Risk Table                     Men   Women  1/2 Average Risk   3.4   3.3   Lab Results  Component Value Date   TRIG 81 12/12/2012   TRIG 294* 06/28/2012   TRIG 128 06/25/2012   Lab Results  Component Value Date   CHOLHDL 3.2 06/22/2012   CHOLHDL 3.1 05/17/2007   No results found for: LDLDIRECT    Radiology: Dg Chest Port 1 View  01/31/2014   CLINICAL DATA:  Chest pain  EXAM: PORTABLE CHEST - 1 VIEW  COMPARISON:  09/24/2012  FINDINGS: Stable cardiomegaly and high upper mediastinal widening. The upper mediastinal widening could be from goiter or ectatic vasculature. Mild rightward deviation of the trachea and subglottic narrowing is similar to previous, favoring goiter. Aortic contours are negative. There is no edema, consolidation, effusion, or pneumothorax.  IMPRESSION: 1. Stable exam.  No acute cardiopulmonary disease. 2. Question goiter with tracheal deviation.   Electronically Signed   By: Tiburcio PeaJonathan  Watts M.D.   On: 01/31/2014 03:58   Dg Abd Portable 1v  01/31/2014   CLINICAL DATA:  Distressed appearance.  EXAM: PORTABLE ABDOMEN - 1 VIEW  COMPARISON:  07/17/2012  FINDINGS: The stomach is moderately gas distended, but not clearly obstructed. There is no evidence of small or large bowel obstruction. No concerning intra-abdominal mass effect or calcification. Lung bases are clear.  IMPRESSION: Negative.   Electronically Signed   By: Tiburcio PeaJonathan  Watts M.D.   On: 01/31/2014 03:59    EKG:  SR LVH lateral ST depression minor change compared to June and September 2015   ASSESSMENT AND PLAN:  Pain:  Not clear why he is grunting Does have CAD distant history with lactic acidosis, elevated Cr , negative enzymes and overall functional status would not recommend cath.  Hydrate.  Further w/u of abdomen including possible CT in order.  IV pain meds.  D/C nitro.  On PO isordil at home ? Dose will resume 20 tid.    Echo to assess EF  He would not  be able to do a good myovue as left arm would not go over head  ASA  Goals of care should be addressed with wife HTN:  Continue home meds including beta blocker  CVA:  Mobility care wife indicates  he does not aspirate diet will depend on further abdominal w/u and CT  Would only consider cath if echo shows severe LV dysfunction, enzymes positive or ST elevation on ECG   Signed: Charlton Haws 01/31/2014, 8:16 AM

## 2014-01-31 NOTE — ED Notes (Signed)
Transporting patient to new room Assignment.  

## 2014-01-31 NOTE — Progress Notes (Signed)
ANTICOAGULATION CONSULT NOTE  Pharmacy Consult for heparin Indication: chest pain/ACS  No Known Allergies  Patient Measurements: Height: 5\' 7"  (170.2 cm) Weight: 157 lb 6.4 oz (71.396 kg) IBW/kg (Calculated) : 66.1  Vital Signs: Temp: 98.3 F (36.8 C) (01/22 2036) Temp Source: Oral (01/22 2036) BP: 155/94 mmHg (01/22 2036) Pulse Rate: 74 (01/22 2036)  Labs:  Recent Labs  01/31/14 0310 01/31/14 0410 01/31/14 0945 01/31/14 1410 01/31/14 2021 01/31/14 2206  HGB  --  13.5  --   --   --   --   HCT  --  40.1  --   --   --   --   PLT  --  203  --   --   --   --   HEPARINUNFRC  --   --   --   --   --  0.79*  CREATININE 1.87*  --   --   --   --   --   TROPONINI  --   --  0.71* 3.14* 16.09*  --     Estimated Creatinine Clearance: 40.3 mL/min (by C-G formula based on Cr of 1.87).  Assessment: 59 y.o. male with chest pain for heparin   Goal of Therapy:  Heparin level 0.3-0.7 units/ml Monitor platelets by anticoagulation protocol: Yes   Plan:  Decrease Heparin 900 units/hr Follow-up am labs.  Geannie RisenGreg Lenetta Piche, PharmD, BCPS  01/31/2014 10:38 PM

## 2014-01-31 NOTE — Progress Notes (Signed)
CRITICAL VALUE ALERT  Critical value received: troponin 3.14  Date of notification:  01/31/2014  Time of notification:  1526  Critical value read back: yes  Nurse who received alert:  Linton FlemingsSamantha Violet Cart  MD notified (1st page):  Benjamine MolaVann   Time of first page:  1526  MD notified (2nd page): Eden EmmsNishan  Time of second page: 1529  Responding MD:  Benjamine MolaVann 1528  Time MD responded:  Eden EmmsNishan 1534

## 2014-01-31 NOTE — Progress Notes (Signed)
CRITICAL VALUE ALERT  Critical value received:  Troponin 0.71  Date of notification:  01/31/14  Time of notification:  1104  Critical value read back: yes  Nurse who received alert:  Linton FlemingsSamantha Elysse Polidore  MD notified (1st page):  Marlin CanaryJessica Vann  Time of first page:  1104  MD notified (2nd page):  Time of second page:  Responding MD:  Marlin CanaryJessica Vann  Time MD responded:  1110

## 2014-01-31 NOTE — Progress Notes (Signed)
OT Cancellation Note  Patient Details Name: Sonia Sideya S Vessell MRN: [id number] DOB: [birth year]   Cancelled Treatment:    Reason Eval/Treat Not Completed: Medical issues which prohibited therapy Elevated troponins. Will assess when appropriate.  [facility] CenterWARD,HILLARY  Levia Waltermire, OTR/L  [phone] 01/31/2014 01/31/2014, 2:09 PM

## 2014-01-31 NOTE — ED Provider Notes (Signed)
CSN: [id number]     Arrival date & time    History  This chart was scribed for Antonio Mackielga M Tye Juarez, MD by Annye AsaAnna Dorsett, ED Scribe. This patient was seen in room B16C/B16C and the patient's care was started at 3:18 AM.    Chief Complaint  Patient presents with  . Chest Pain   The history is provided by the spouse. History limited by: patient's mentation, motor and verbal skills affected from previous strokes; he is nonverbal at baseline. No language interpreter was used.     HPI Comments: Sonia Sideya S Bean is a 59 y.o. male who presents to the Emergency Department complaining of reported chest pain, beginning at 23:00 and lasting a short time before resolving and then returning about 2 hours PTA. Wife reports that patient has been increasingly restless, with increased vocalizations ("grunting") and "grabbing at his chest."  Patient is nonverbal.  After prior stroke.  Wife reports that at baseline he recognizes people.  He has unintelligible speech.  It is unclear if he is able to answer questions yes or no.  She reports he often has episodes where he will grunt and clutching his chest, but none lasting this long or as intense.  No missed medications.  No fever no chills, no nausea no vomiting reported.  She explains that patient's blood pressures are normally high; they are regularly 170s or over.   Past Medical History  Diagnosis Date  . Stroke 2007  . Hypertension   . Difficult airway for intubation 06/22/2012    Deep and anterior. Difficult with standard laryngoscope. Easily intubated with Glidescope   . Coronary artery disease     Stent placement  . Malignant hypertension 07/14/2012  . 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 MSSA (methicillin susceptible Staphylococcus aureus) pneumonia 07/03/2012  . CVA (cerebral infarction) 06/22/2012  . Seizure, possible 06/22/2012  . Anxiety state, unspecified   . Zygomatic hyperplasia   . Abnormality of gait 10/03/2013   Past Surgical History   Procedure Laterality Date  . Coronary stent placement    . Cardiac surgery     Family History  Problem Relation Age of Onset  . Hypertension Mother   . Hypertension Father    History  Substance Use Topics  . Smoking status: Never Smoker   . Smokeless tobacco: Never Used  . Alcohol Use: No    Review of Systems  Unable to perform ROS: Patient nonverbal  Cardiovascular: Positive for chest pain.  All other systems reviewed and are negative.   Allergies  Review of patient's allergies indicates no known allergies.  Home Medications   Prior to Admission medications   Medication Sig Start Date End Date Taking? Authorizing Provider  acetaminophen (TYLENOL) 325 MG tablet Give 650 mg by tube every 6 (six) hours as needed (elevated temperature).    Historical Provider, MD  aspirin 81 MG chewable tablet Chew 81 mg by mouth daily.    Historical Provider, MD  atorvastatin (LIPITOR) 10 MG tablet Take 10 mg by mouth daily.    Historical Provider, MD  chlorthalidone (HYGROTON) 25 MG tablet Take 12.5 mg by mouth daily.    Historical Provider, MD  clonazePAM (KLONOPIN) 0.5 MG tablet Take one tablet by mouth every 12 hours for depression 12/04/13   Oneal GroutMahima Pandey, MD  cloNIDine (CATAPRES - DOSED IN MG/24 HR) 0.3 mg/24hr patch Place 1 patch onto the skin once a week. 07/17/12   Hollice EspySendil K Krishnan, MD  divalproex (DEPAKOTE SPRINKLE) 125 MG capsule Take  250 mg by mouth 2 (two) times daily.     Historical Provider, MD  DULoxetine (CYMBALTA) 30 MG capsule Take 30 mg by mouth daily.    Historical Provider, MD  erythromycin ethylsuccinate (EES) 200 MG/5ML suspension Take 250 mg by mouth every 6 (six) hours.    Historical Provider, MD  hydrALAZINE (APRESOLINE) 50 MG tablet Take 50 mg by mouth every 6 (six) hours. Also 50 mg twice daily for b/p>160/90 as needed 09/27/12   Ejiroghene E Emokpae, MD  isosorbide dinitrate (ISORDIL) 40 MG tablet Take 80 mg by mouth 3 (three) times daily. 09/27/12   Ejiroghene Wendall Stade, MD  levETIRAcetam (KEPPRA) 500 MG tablet Take 500 mg by mouth every 12 (twelve) hours.    Historical Provider, MD  losartan (COZAAR) 50 MG tablet Take 100 mg by mouth daily. 12/14/12   Sharee Holster, NP  metFORMIN (GLUCOPHAGE) 500 MG tablet Take 500 mg by mouth 2 (two) times daily with a meal.    Historical Provider, MD  metoprolol tartrate (LOPRESSOR) 25 MG tablet Take 1 tablet (25 mg total) by mouth every 6 (six) hours. 09/25/12   Ejiroghene Wendall Stade, MD  omeprazole (PRILOSEC) 20 MG capsule Take 20 mg by mouth daily.    Historical Provider, MD  polyethylene glycol (MIRALAX / GLYCOLAX) packet Take 17 g by mouth 2 (two) times daily.    Historical Provider, MD  polyvinyl alcohol (LIQUIFILM TEARS) 1.4 % ophthalmic solution Place 1 drop into both eyes 2 (two) times daily.    Historical Provider, MD  sennosides-docusate sodium (SENOKOT-S) 8.6-50 MG tablet Take 1 tablet by mouth daily.    Historical Provider, MD  Water For Irrigation, Sterile (STERILE WATER FOR IRRIGATION) Irrigate with 100 mLs as directed every 8 (eight) hours.     Historical Provider, MD   BP 193/129 mmHg  Pulse 79  Temp(Src) 98.7 F (37.1 C) (Oral)  Resp 34  SpO2 100% Physical Exam  Constitutional: He appears well-developed and well-nourished. He appears distressed (patient grunting, grabbing at chest).  HENT:  Head: Normocephalic and atraumatic.  Nose: Nose normal.  Mouth/Throat: Oropharynx is clear and moist. No oropharyngeal exudate.  Eyes: EOM are normal. Pupils are equal, round, and reactive to light.  Neck: Normal range of motion. Neck supple.  Cardiovascular: Normal rate, regular rhythm and normal heart sounds.  Exam reveals no gallop and no friction rub.   No murmur heard. Pulmonary/Chest: Effort normal and breath sounds normal. No respiratory distress. He has no wheezes. He has no rales. He exhibits no tenderness.  Abdominal: Soft. Bowel sounds are normal. He exhibits no distension. There is no rebound  and no guarding.  Unable to assess for tenderness.  Given patient's nonverbal status, he does not seem to grunt more when I palpate the abdomen.  At times he will tense his abdominal muscles, but in between these episodes, the abdomen is soft and nondistended.  Musculoskeletal: Normal range of motion. He exhibits no edema.  Neurological: He is alert.  Skin: Skin is warm and dry. No rash noted.  Nursing note and vitals reviewed.   ED Course  Procedures   DIAGNOSTIC STUDIES: Oxygen Saturation is 100% on RA, normal by my interpretation.    COORDINATION OF CARE: 3:23 AM Discussed treatment plan with pt at bedside and pt agreed to plan.   Labs Review Labs Reviewed  BASIC METABOLIC PANEL - Abnormal; Notable for the following:    Glucose, Bld 182 (*)    BUN 26 (*)  Creatinine, Ser 1.87 (*)    GFR calc non Af Amer 38 (*)    GFR calc Af Amer 44 (*)    All other components within normal limits  CBC - Abnormal; Notable for the following:    RDW 15.8 (*)    All other components within normal limits  I-STAT CG4 LACTIC ACID, ED - Abnormal; Notable for the following:    Lactic Acid, Venous 4.58 (*)    All other components within normal limits  HEPATIC FUNCTION PANEL  LIPASE, BLOOD  I-STAT TROPOININ, ED    Imaging Review No results found.   EKG Interpretation   Date/Time:  Friday January 31 2014 03:12:03 EST Ventricular Rate:  87 PR Interval:  140 QRS Duration: 84 QT Interval:  412 QTC Calculation: 495 R Axis:   93 Text Interpretation:  Sinus rhythm with Premature atrial complexes  Rightward axis Septal infarct , age undetermined Marked ST abnormality,  possible inferior subendocardial injury Marked ST abnormality, possible  anterolateral subendocardial injury Abnormal ECG new ST depressions  laterally Confirmed by Teela Narducci  MD, Garet Hooton ([id number]) on 01/31/2014 3:17:26 AM      MDM   Final diagnoses:  Distressed appearance  Hypertensive urgency  EKG abnormalities  Elevated  lactic acid level   I personally performed the services described in this documentation, which was scribed in my presence. The recorded information has been reviewed and is accurate.  59 year old male, history of hypertension, prior strokes with expressive and receptive aphasia who presents with distress tonight.  Patient is significantly hypertensive.  He has EKG with new ST depressions laterally.  Initial troponin is negative.  Lactate is elevated.  Patient does not appear to have acute abdomen.  Chest and abdomen x-ray unremarkable.  Patient appears improved with morphine and IV nitroglycerin.  It is unclear at this time what the underlying process is.  Hypertensive urgency could explain his along with his worsening renal function.  Will discuss with hospitalist for admission for further workup.     Antonio Mackie, MD 01/31/14 [phone]

## 2014-01-31 NOTE — Progress Notes (Signed)
Patient admitted after midnight- please see H&P.  ? Etiology of pain- chest vs constipation vs UTI- await urine, will give stool softeners.  Appreciate cardiology  Marlin CanaryJessica Seham Gardenhire DO

## 2014-01-31 NOTE — ED Notes (Signed)
[facility].

## 2014-01-31 NOTE — Progress Notes (Signed)
PT Cancellation Note  Patient Details Name: [name] MRN: [id number] DOB: May 20, 1955   Cancelled Treatment:    Reason Eval/Treat Not Completed: Medical issues which prohibited therapy.  Patient with elevated troponins.  Will return at later time for PT evaluation as appropriate.   Vena AustriaDavis, Rey Dansby H 01/31/2014, 4:26 PM Durenda HurtSusan H. Renaldo Fiddleravis, PT, [facility] Acute [facility] Pager [phone]

## 2014-01-31 NOTE — H&P (Signed)
[facility] History and Physical  [name] AVW:[id number] DOB: [birth year]    PCP:   Terald SleeperOBSON,MICHAEL GAVIN, MD   Chief Complaint:  Patient is likely having chest pain.  HPI: [name] is an 59 y.o. 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  She said he has done this before, but this time, it was more persistent.  He had no vomiting, but did have diaphoresis.  There has been no coughs.  Evalatuion in the ER showed EKG with lateral ST depression, clear CXR and abdominal films were unremarkable.  He has elevated BUN and Cr, and his BS was 180.  He also has been on anticonvulsive medications which family believes were to prevent seizure.  He also has been on Metformin, though family said he never was diagnosed with DM.  He was given pain meds, and started IV NTG.  He appears more comfortable.  His lipase and initial troponin were negative. No fever or leukocytosis.  His lactic acid was elevated.  Hospitalist was asked to admit him for cardiac r/out.   Rewiew of Systems: Unable.    Past Medical History  Diagnosis Date  . Stroke 2007  . Hypertension   . Difficult airway for intubation 06/22/2012    Deep and anterior. Difficult with standard laryngoscope. Easily intubated with Glidescope   . Coronary artery disease     Stent placement  . Malignant hypertension 07/14/2012  . 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 MSSA (methicillin susceptible Staphylococcus aureus) pneumonia 07/03/2012  . CVA (cerebral infarction) 06/22/2012  . Seizure, possible 06/22/2012  . Anxiety state, unspecified   . Zygomatic hyperplasia   . Abnormality of gait 10/03/2013    Past Surgical History  Procedure Laterality Date  . Coronary stent placement    . Cardiac surgery      Medications:  HOME MEDS: Prior to Admission medications   Medication Sig Start Date End Date Taking?  Authorizing Provider  acetaminophen (TYLENOL) 325 MG tablet Give 650 mg by tube every 6 (six) hours as needed (elevated temperature).   Yes Historical Provider, MD  aspirin 81 MG chewable tablet Chew 81 mg by mouth daily.   Yes Historical Provider, MD  chlorthalidone (HYGROTON) 25 MG tablet Take 12.5 mg by mouth daily.   Yes Historical Provider, MD  clonazePAM (KLONOPIN) 0.5 MG tablet Take one tablet by mouth every 12 hours for depression 12/04/13  Yes Mahima Pandey, MD  cloNIDine (CATAPRES - DOSED IN MG/24 HR) 0.3 mg/24hr patch Place 1 patch onto the skin once a week. 07/17/12  Yes Hollice EspySendil K Krishnan, MD  divalproex (DEPAKOTE SPRINKLE) 125 MG capsule Take 375 mg by mouth 2 (two) times daily.    Yes Historical Provider, MD  DULoxetine (CYMBALTA) 30 MG capsule Take 30 mg by mouth daily.   Yes Historical Provider, MD  hydrALAZINE (APRESOLINE) 50 MG tablet Take 50 mg by mouth every 6 (six) hours. Also 50 mg twice daily for b/p>160/90 as needed 09/27/12  Yes Ejiroghene E Emokpae, MD  isosorbide dinitrate (ISORDIL) 40 MG tablet Take 80 mg by mouth 3 (three) times daily. 09/27/12  Yes Ejiroghene E Emokpae, MD  levETIRAcetam (KEPPRA) 500 MG tablet Take 250 mg by mouth every 12 (twelve) hours.    Yes Historical Provider, MD  losartan (COZAAR) 50 MG tablet Take 100 mg by mouth daily. 12/14/12  Yes  Sharee Holster, NP  metFORMIN (GLUCOPHAGE) 500 MG tablet Take 500 mg by mouth 2 (two) times daily with a meal.   Yes Historical Provider, MD  metoprolol tartrate (LOPRESSOR) 25 MG tablet Take 1 tablet (25 mg total) by mouth every 6 (six) hours. 09/25/12  Yes Ejiroghene E Emokpae, MD  omeprazole (PRILOSEC) 20 MG capsule Take 20 mg by mouth daily.   Yes Historical Provider, MD  polyethylene glycol (MIRALAX / GLYCOLAX) packet Take 17 g by mouth 2 (two) times daily.   Yes Historical Provider, MD  polyvinyl alcohol (LIQUIFILM TEARS) 1.4 % ophthalmic solution Place 1 drop into both eyes 2 (two) times daily.   Yes Historical  Provider, MD  sennosides-docusate sodium (SENOKOT-S) 8.6-50 MG tablet Take 1 tablet by mouth daily.   Yes Historical Provider, MD  Vitamin D, Ergocalciferol, (DRISDOL) 50000 UNITS CAPS capsule Take 50,000 Units by mouth every 30 (thirty) days.   Yes Historical Provider, MD  atorvastatin (LIPITOR) 10 MG tablet Take 10 mg by mouth daily.    Historical Provider, MD  erythromycin ethylsuccinate (EES) 200 MG/5ML suspension Take 250 mg by mouth every 6 (six) hours.    Historical Provider, MD  Water For Irrigation, Sterile (STERILE WATER FOR IRRIGATION) Irrigate with 100 mLs as directed every 8 (eight) hours.     Historical Provider, MD     Allergies:  No Known Allergies  Social History:   reports that he has never smoked. He has never used smokeless tobacco. He reports that he does not drink alcohol or use illicit drugs.  Family History: Family History  Problem Relation Age of Onset  . Hypertension Mother   . Hypertension Father      Physical Exam: Filed Vitals:   01/31/14 0353 01/31/14 0420 01/31/14 0430 01/31/14 0515  BP:  190/104 169/102 177/81  Pulse:  90 69 62  Temp: 99.1 F (37.3 C)     TempSrc: Rectal     Resp:  SpO2:  100% 100% 100%   Blood pressure 177/81, pulse 62, temperature 99.1 F (37.3 C), temperature source Rectal, resp. rate 18, SpO2 100 %.  GEN:  Pleasant patient lying in the stretcher in no acute distress; cooperative with exam. PSYCH:   does not appear anxious or depressed; affect is appropriate. HEENT: Mucous membranes pink and anicteric; PERRLA; EOM intact; no cervical lymphadenopathy nor thyromegaly or carotid bruit; no JVD; There were no stridor. Neck is very supple. Breasts:: Not examined CHEST WALL: No tenderness CHEST: Normal respiration, clear to auscultation bilaterally.  HEART: Regular rate and rhythm.  There are no murmur, rub, or gallops.   BACK: No kyphosis or scoliosis; no CVA tenderness ABDOMEN: soft and non-tender; no masses, no  organomegaly, normal abdominal bowel sounds; no pannus; no intertriginous candida. There is no rebound and no distention. Rectal Exam: Not done EXTREMITIES: No bone or joint deformity; age-appropriate arthropathy of the hands and knees; no edema; no ulcerations.  There is no calf tenderness. Genitalia: not examined PULSES: 2+ and symmetric SKIN: Normal hydration no rash or ulceration CNS: Cranial nerves 2-12 grossly intact no focal lateralizing neurologic deficit.   He is globally aphasic and doesn't follow any command.   Labs on Admission:  Basic Metabolic Panel:  Recent Labs Lab 01/31/14 0310  NA 138  K 4.9  CL 100  CO2 23  GLUCOSE 182*  BUN 26*  CREATININE 1.87*  CALCIUM 9.5   Liver Function Tests:  Recent Labs Lab 01/31/14 0310  AST 36  ALT 28  ALKPHOS 69  BILITOT 0.7  PROT 7.7  ALBUMIN 3.8    Recent Labs Lab 01/31/14 0310  LIPASE 49   No results for input(s): AMMONIA in the last 168 hours. CBC:  Recent Labs Lab 01/31/14 0410  WBC 8.2  HGB 13.5  HCT 40.1  MCV 79.2  PLT 203   Cardiac Enzymes: No results for input(s): CKTOTAL, CKMB, CKMBINDEX, TROPONINI in the last 168 hours.  CBG: No results for input(s): GLUCAP in the last 168 hours.   Radiological Exams on Admission: Dg Chest Port 1 View  01/31/2014   CLINICAL DATA:  Chest pain  EXAM: PORTABLE CHEST - 1 VIEW  COMPARISON:  09/24/2012  FINDINGS: Stable cardiomegaly and high upper mediastinal widening. The upper mediastinal widening could be from goiter or ectatic vasculature. Mild rightward deviation of the trachea and subglottic narrowing is similar to previous, favoring goiter. Aortic contours are negative. There is no edema, consolidation, effusion, or pneumothorax.  IMPRESSION: 1. Stable exam.  No acute cardiopulmonary disease. 2. Question goiter with tracheal deviation.   Electronically Signed   By: Tiburcio Pea M.D.   On: 01/31/2014 03:58   Dg Abd Portable 1v  01/31/2014   CLINICAL DATA:   Distressed appearance.  EXAM: PORTABLE ABDOMEN - 1 VIEW  COMPARISON:  07/17/2012  FINDINGS: The stomach is moderately gas distended, but not clearly obstructed. There is no evidence of small or large bowel obstruction. No concerning intra-abdominal mass effect or calcification. Lung bases are clear.  IMPRESSION: Negative.   Electronically Signed   By: Tiburcio Pea M.D.   On: 01/31/2014 03:59    EKG: Independently reviewed. NSR with ST depression in the inferolateral leads.    Assessment/Plan Present on Admission:  . Chest pain at rest  PLAN:  It is difficult to assess him given his global aphasia after prior CVA.  I think it is reasonable to continue IV NTG until we completely rule him out.  I have continued his meds.  He is also clinically and serologically volume depleted, and will be given IVF.  Since his Cr is elevated, Metformin will be held.  The lactic acid, I believe, is from the Metformin, rather than him being septic.  Will continue with morphine and current meds.  I spoke with family and currently he is a full code, though at first, they said he was not.  Family will let us know otherwise after further discussion among themselves.  He is stable and will be admitted to hospitalist service under telemetry.   Other plans as per orders.  Code Status: FULL Unk Lightning, MD. [facility] Pager [phone] 7pm to 7am.  01/31/2014, 5:31 AM

## 2014-01-31 NOTE — Progress Notes (Signed)
UR completed 

## 2014-01-31 NOTE — ED Notes (Signed)
IV team at bedside 

## 2014-01-31 NOTE — ED Notes (Signed)
Patient from home. Patient has had strokes that has affected his mentation as well as his motor skills on the left and right side. Patient is non-verbal at baseline, usually grunts responses. Wife states patient sometimes grabs his chest and grunts but tonight he was doing it more than usual. BP 200/120 hx of HTN. HR 76 99% on RA CBG 212

## 2014-02-01 DIAGNOSIS — N3000 Acute cystitis without hematuria: Secondary | ICD-10-CM

## 2014-02-01 DIAGNOSIS — Z79899 Other long term (current) drug therapy: Secondary | ICD-10-CM | POA: Diagnosis not present

## 2014-02-01 DIAGNOSIS — R079 Chest pain, unspecified: Secondary | ICD-10-CM | POA: Diagnosis present

## 2014-02-01 DIAGNOSIS — E1149 Type 2 diabetes mellitus with other diabetic neurological complication: Secondary | ICD-10-CM | POA: Diagnosis present

## 2014-02-01 DIAGNOSIS — I214 Non-ST elevation (NSTEMI) myocardial infarction: Secondary | ICD-10-CM | POA: Diagnosis present

## 2014-02-01 DIAGNOSIS — Z66 Do not resuscitate: Secondary | ICD-10-CM | POA: Diagnosis not present

## 2014-02-01 DIAGNOSIS — N390 Urinary tract infection, site not specified: Secondary | ICD-10-CM | POA: Diagnosis not present

## 2014-02-01 DIAGNOSIS — I255 Ischemic cardiomyopathy: Secondary | ICD-10-CM | POA: Diagnosis not present

## 2014-02-01 DIAGNOSIS — R4701 Aphasia: Secondary | ICD-10-CM

## 2014-02-01 DIAGNOSIS — Z7982 Long term (current) use of aspirin: Secondary | ICD-10-CM | POA: Diagnosis not present

## 2014-02-01 DIAGNOSIS — E876 Hypokalemia: Secondary | ICD-10-CM | POA: Diagnosis not present

## 2014-02-01 DIAGNOSIS — I69320 Aphasia following cerebral infarction: Secondary | ICD-10-CM | POA: Diagnosis not present

## 2014-02-01 DIAGNOSIS — I2510 Atherosclerotic heart disease of native coronary artery without angina pectoris: Secondary | ICD-10-CM | POA: Diagnosis not present

## 2014-02-01 DIAGNOSIS — I471 Supraventricular tachycardia: Secondary | ICD-10-CM | POA: Diagnosis not present

## 2014-02-01 DIAGNOSIS — Z955 Presence of coronary angioplasty implant and graft: Secondary | ICD-10-CM | POA: Diagnosis not present

## 2014-02-01 DIAGNOSIS — I10 Essential (primary) hypertension: Secondary | ICD-10-CM | POA: Diagnosis not present

## 2014-02-01 DIAGNOSIS — Z7401 Bed confinement status: Secondary | ICD-10-CM | POA: Diagnosis not present

## 2014-02-01 DIAGNOSIS — Z515 Encounter for palliative care: Secondary | ICD-10-CM | POA: Diagnosis not present

## 2014-02-01 DIAGNOSIS — N179 Acute kidney failure, unspecified: Secondary | ICD-10-CM | POA: Diagnosis present

## 2014-02-01 DIAGNOSIS — E872 Acidosis: Secondary | ICD-10-CM | POA: Diagnosis present

## 2014-02-01 DIAGNOSIS — K5900 Constipation, unspecified: Secondary | ICD-10-CM | POA: Diagnosis not present

## 2014-02-01 DIAGNOSIS — R400 Somnolence: Secondary | ICD-10-CM | POA: Diagnosis not present

## 2014-02-01 LAB — COMPREHENSIVE METABOLIC PANEL
ALT: 43 U/L (ref 0–53)
ANION GAP: 13 (ref 5–15)
AST: 170 U/L — ABNORMAL HIGH (ref 0–37)
Albumin: 3.3 g/dL — ABNORMAL LOW (ref 3.5–5.2)
Alkaline Phosphatase: 64 U/L (ref 39–117)
BUN: 12 mg/dL (ref 6–23)
CHLORIDE: 98 mmol/L (ref 96–112)
CO2: 22 mmol/L (ref 19–32)
Calcium: 8.8 mg/dL (ref 8.4–10.5)
Creatinine, Ser: 1.22 mg/dL (ref 0.50–1.35)
GFR calc Af Amer: 74 mL/min — ABNORMAL LOW (ref >90)
GFR calc non Af Amer: 64 mL/min — ABNORMAL LOW (ref >90)
Glucose, Bld: 179 mg/dL — ABNORMAL HIGH (ref 70–99)
Potassium: 3.8 mmol/L (ref 3.5–5.1)
SODIUM: 133 mmol/L — AB (ref 135–145)
TOTAL PROTEIN: 7.3 g/dL (ref 6.0–8.3)
Total Bilirubin: 0.6 mg/dL (ref 0.3–1.2)

## 2014-02-01 LAB — CBC
HCT: 37.2 % — ABNORMAL LOW (ref 39.0–52.0)
HEMOGLOBIN: 12.6 g/dL — AB (ref 13.0–17.0)
MCH: 26.7 pg (ref 26.0–34.0)
MCHC: 33.9 g/dL (ref 30.0–36.0)
MCV: 78.8 fL (ref 78.0–100.0)
Platelets: 176 10*3/uL (ref 150–400)
RBC: 4.72 MIL/uL (ref 4.22–5.81)
RDW: 15.7 % — AB (ref 11.5–15.5)
WBC: 13.1 10*3/uL — AB (ref 4.0–10.5)

## 2014-02-01 LAB — LACTIC ACID, PLASMA: LACTIC ACID, VENOUS: 1.6 mmol/L (ref 0.5–2.0)

## 2014-02-01 LAB — HEPARIN LEVEL (UNFRACTIONATED)
HEPARIN UNFRACTIONATED: 0.68 [IU]/mL (ref 0.30–0.70)
Heparin Unfractionated: 0.64 IU/mL (ref 0.30–0.70)

## 2014-02-01 MED ORDER — METOPROLOL TARTRATE 50 MG PO TABS
50.0000 mg | ORAL_TABLET | Freq: Four times a day (QID) | ORAL | Status: DC
  Administered 2014-02-01 – 2014-02-03 (×6): 50 mg via ORAL
  Filled 2014-02-01 (×6): qty 1

## 2014-02-01 MED ORDER — INFLUENZA VAC SPLIT QUAD 0.5 ML IM SUSY
0.5000 mL | PREFILLED_SYRINGE | INTRAMUSCULAR | Status: AC
  Administered 2014-02-03: 0.5 mL via INTRAMUSCULAR
  Filled 2014-02-01: qty 0.5

## 2014-02-01 MED ORDER — PNEUMOCOCCAL VAC POLYVALENT 25 MCG/0.5ML IJ INJ
0.5000 mL | INJECTION | INTRAMUSCULAR | Status: AC
Start: 2014-02-02 — End: 2014-02-03
  Administered 2014-02-03: 0.5 mL via INTRAMUSCULAR
  Filled 2014-02-01: qty 0.5

## 2014-02-01 MED ORDER — ASPIRIN EC 325 MG PO TBEC
325.0000 mg | DELAYED_RELEASE_TABLET | Freq: Every day | ORAL | Status: DC
  Administered 2014-02-02: 325 mg via ORAL
  Filled 2014-02-01: qty 1

## 2014-02-01 MED ORDER — HEPARIN (PORCINE) IN NACL 100-0.45 UNIT/ML-% IJ SOLN
850.0000 [IU]/h | INTRAMUSCULAR | Status: DC
  Administered 2014-02-01 (×2): 850 [IU]/h via INTRAVENOUS
  Filled 2014-02-01: qty 250

## 2014-02-01 MED ORDER — CEFTRIAXONE SODIUM IN DEXTROSE 20 MG/ML IV SOLN
1.0000 g | INTRAVENOUS | Status: DC
  Administered 2014-02-01 – 2014-02-02 (×2): 1 g via INTRAVENOUS
  Filled 2014-02-01 (×3): qty 50

## 2014-02-01 MED ORDER — BISACODYL 10 MG RE SUPP: 10.0000 mg | Freq: Every day | RECTAL | Status: DC | PRN

## 2014-02-01 NOTE — Progress Notes (Signed)
DAILY PROGRESS NOTE  Subjective:  Globally aphasic - stared at me during the exam. Reportedly bed-bound. Did, however, signal that he was having pain in his chest on admission. Appears quite comfortable today on IV heparin. Troponin is significantly elevated (.71, 3.14, and 16.09) c/w NSTEMI.  Echo shows EF of 35-40% with septal, apical and inferoapical hypokinesis. Creatinine has improved back to baseline.  Objective:  Temp:  [98.3 F (36.8 C)-100.7 F (38.2 C)] 100.7 F (38.2 C) (01/23 0445) Pulse Rate:  [67-107] 107 (01/23 0824) Resp:  [0-27] 22 (01/23 0445) BP: (148-176)/(86-119) 176/106 mmHg (01/23 0824) SpO2:  [93 %-100 %] 93 % (01/23 0445) Weight:  [159 lb (72.122 kg)] 159 lb (72.122 kg) (01/23 0445) Weight change:   Intake/Output from previous day: 01/22 0701 - 01/23 0700 In: 2514 [P.O.:415; I.V.:2099] Out: 1925 [Urine:1925]  Intake/Output from this shift: Total I/O In: 3 [I.V.:3] Out: -   Medications: Current Facility-Administered Medications  Medication Dose Route Frequency Provider Last Rate Last Dose  . 0.9 %  sodium chloride infusion   Intravenous Continuous Orvan Falconer, MD 100 mL/hr at 01/31/14 2107 100 mL/hr at 01/31/14 2107  . aspirin EC tablet 81 mg  81 mg Oral Daily Orvan Falconer, MD   81 mg at 02/01/14 1024  . atorvastatin (LIPITOR) tablet 10 mg  10 mg Oral Daily Orvan Falconer, MD   10 mg at 02/01/14 1025  . bisacodyl (DULCOLAX) suppository 10 mg  10 mg Rectal Daily PRN Geradine Girt, DO      . cefTRIAXone (ROCEPHIN) 1 g in dextrose 5 % 50 mL IVPB - Premix  1 g Intravenous Q24H Jessica U Vann, DO   1 g at 02/01/14 1026  . clonazePAM (KLONOPIN) tablet 0.5 mg  0.5 mg Oral BID PRN Orvan Falconer, MD   0.5 mg at 02/01/14 0358  . cloNIDine (CATAPRES - Dosed in mg/24 hr) patch 0.3 mg  0.3 mg Transdermal Q Fri Orvan Falconer, MD   0.3 mg at 01/31/14 1100  . divalproex (DEPAKOTE SPRINKLE) capsule 375 mg  375 mg Oral BID Orvan Falconer, MD   375 mg at 01/31/14 2100  . DULoxetine (CYMBALTA)  DR capsule 30 mg  30 mg Oral Daily Orvan Falconer, MD   30 mg at 02/01/14 1025  . heparin ADULT infusion 100 units/mL (25000 units/250 mL)  850 Units/hr Intravenous Continuous Adora Fridge, RPH 8.5 mL/hr at 02/01/14 1027 850 Units/hr at 02/01/14 1027  . hydrALAZINE (APRESOLINE) tablet 50 mg  50 mg Oral Q6H Orvan Falconer, MD   50 mg at 02/01/14 7616  . [START ON 02/02/2014] Influenza vac split quadrivalent PF (FLUARIX) injection 0.5 mL  0.5 mL Intramuscular Tomorrow-1000 Geradine Girt, DO      . levETIRAcetam (KEPPRA) tablet 250 mg  250 mg Oral Q12H Orvan Falconer, MD   250 mg at 02/01/14 1024  . metoprolol tartrate (LOPRESSOR) tablet 25 mg  25 mg Oral Q6H Orvan Falconer, MD   25 mg at 02/01/14 0824  . morphine 2 MG/ML injection 2 mg  2 mg Intravenous Q2H PRN Orvan Falconer, MD   2 mg at 02/01/14 0551  . nitroGLYCERIN 50 mg in dextrose 5 % 250 mL (0.2 mg/mL) infusion  2-200 mcg/min Intravenous Titrated Kalman Drape, MD   Stopped at 01/31/14 1123  . ondansetron (ZOFRAN) tablet 4 mg  4 mg Oral Q6H PRN Orvan Falconer, MD       Or  . ondansetron [facility]) injection 4 mg  4  mg Intravenous Q6H PRN Orvan Falconer, MD      . pantoprazole (PROTONIX) EC tablet 80 mg  80 mg Oral Daily Orvan Falconer, MD   80 mg at 02/01/14 1025  . [START ON 02/02/2014] pneumococcal 23 valent vaccine (PNU-IMMUNE) injection 0.5 mL  0.5 mL Intramuscular Tomorrow-1000 Jessica U Vann, DO      . polyethylene glycol (MIRALAX / GLYCOLAX) packet 17 g  17 g Oral Daily Jessica U Vann, DO   17 g at 02/01/14 1025  . polyvinyl alcohol (LIQUIFILM TEARS) 1.4 % ophthalmic solution 1 drop  1 drop Both Eyes BID Orvan Falconer, MD   1 drop at 02/01/14 1026  . senna-docusate (Senokot-S) tablet 1 tablet  1 tablet Oral Daily Orvan Falconer, MD   1 tablet at 02/01/14 1025  . sodium chloride 0.9 % injection 3 mL  3 mL Intravenous Q12H Orvan Falconer, MD   3 mL at 02/01/14 1027  . [START ON 02/16/2014] Vitamin D (Ergocalciferol) (DRISDOL) capsule 50,000 Units  50,000 Units Oral Q30 days Orvan Falconer, MD         Physical Exam: General appearance: alert and no verbal Lungs: clear to auscultation bilaterally Heart: regular rate and rhythm Extremities: extremities normal, atraumatic, no cyanosis or edema Pulses: 2+ and symmetric Neurologic: Mental status: Eyes open, does not follow commands or speak  Lab Results: Results for orders placed or performed during the hospital encounter of 01/31/14 (from the past 48 hour(s))  Basic metabolic panel     Status: Abnormal   Collection Time: 01/31/14  3:10 AM  Result Value Ref Range   Sodium 138 135 - 145 mmol/L    Comment: Please note change in reference range.   Potassium 4.9 3.5 - 5.1 mmol/L    Comment: Please note change in reference range.   Chloride 100 96 - 112 mEq/L   CO2 23 19 - 32 mmol/L   Glucose, Bld 182 (H) 70 - 99 mg/dL   BUN 26 (H) 6 - 23 mg/dL   Creatinine, Ser 1.87 (H) 0.50 - 1.35 mg/dL   Calcium 9.5 8.4 - 10.5 mg/dL   GFR calc non Af Amer 38 (L) >90 mL/min   GFR calc Af Amer 44 (L) >90 mL/min    Comment: (NOTE) The eGFR has been calculated using the CKD EPI equation. This calculation has not been validated in all clinical situations. eGFR's persistently <90 mL/min signify possible Chronic Kidney Disease.    Anion gap 15 5 - 15  Hepatic function panel     Status: None   Collection Time: 01/31/14  3:10 AM  Result Value Ref Range   Total Protein 7.7 6.0 - 8.3 g/dL   Albumin 3.8 3.5 - 5.2 g/dL   AST 36 0 - 37 U/L   ALT 28 0 - 53 U/L   Alkaline Phosphatase 69 39 - 117 U/L   Total Bilirubin 0.7 0.3 - 1.2 mg/dL   Bilirubin, Direct 0.2 0.0 - 0.5 mg/dL    Comment: Please note change in reference range.   Indirect Bilirubin 0.5 0.3 - 0.9 mg/dL  Lipase, blood     Status: None   Collection Time: 01/31/14  3:10 AM  Result Value Ref Range   Lipase 49 11 - 59 U/L  I-stat troponin, ED (not at [facility])     Status: None   Collection Time: 01/31/14  3:17 AM  Result Value Ref Range   Troponin i, poc 0.06 0.00 - 0.08 ng/mL   Comment 3  Comment: Due to the release kinetics of cTnI, a negative result within the first hours of the onset of symptoms does not rule out myocardial infarction with certainty. If myocardial infarction is still suspected, repeat the test at appropriate intervals.   I-Stat CG4 Lactic Acid, ED     Status: Abnormal   Collection Time: 01/31/14  3:28 AM  Result Value Ref Range   Lactic Acid, Venous 4.58 (HH) 0.5 - 2.0 mmol/L   Comment NOTIFIED PHYSICIAN   CBC     Status: Abnormal   Collection Time: 01/31/14  4:10 AM  Result Value Ref Range   WBC 8.2 4.0 - 10.5 K/uL   RBC 5.06 4.22 - 5.81 MIL/uL   Hemoglobin 13.5 13.0 - 17.0 g/dL   HCT 40.1 39.0 - 52.0 %   MCV 79.2 78.0 - 100.0 fL   MCH 26.7 26.0 - 34.0 pg   MCHC 33.7 30.0 - 36.0 g/dL   RDW 15.8 (H) 11.5 - 15.5 %   Platelets 203 150 - 400 K/uL  MRSA PCR Screening     Status: None   Collection Time: 01/31/14  7:20 AM  Result Value Ref Range   MRSA by PCR NEGATIVE NEGATIVE    Comment:        The GeneXpert MRSA Assay (FDA approved for NASAL specimens only), is one component of a comprehensive MRSA colonization surveillance program. It is not intended to diagnose MRSA infection nor to guide or monitor treatment for MRSA infections.   Troponin I (q 6hr x 3)     Status: Abnormal   Collection Time: 01/31/14  9:45 AM  Result Value Ref Range   Troponin I 0.71 (HH) <0.031 ng/mL    Comment:        POSSIBLE MYOCARDIAL ISCHEMIA. SERIAL TESTING RECOMMENDED. REPEATED TO VERIFY CRITICAL RESULT CALLED TO, READ BACK BY AND VERIFIED WITH: S.HARDY,RN 01/31/14 1104 CLARK,S   TSH     Status: None   Collection Time: 01/31/14  9:45 AM  Result Value Ref Range   TSH 2.761 0.350 - 4.500 uIU/mL  Hemoglobin A1c     Status: Abnormal   Collection Time: 01/31/14  9:45 AM  Result Value Ref Range   Hgb A1c MFr Bld 6.6 (H) <5.7 %    Comment: (NOTE)                                                                       According to the ADA Clinical  Practice Recommendations for 2011, when HbA1c is used as a screening test:  >=6.5%   Diagnostic of Diabetes Mellitus           (if abnormal result is confirmed) 5.7-6.4%   Increased risk of developing Diabetes Mellitus References:Diagnosis and Classification of Diabetes Mellitus,Diabetes PHXT,0569,79(YIAXK 1):S62-S69 and Standards of Medical Care in         Diabetes - 2011,Diabetes Care,2011,34 (Suppl 1):S11-S61.    Mean Plasma Glucose 143 (H) <117 mg/dL    Comment: Performed at [facility]  Urinalysis, Routine w reflex microscopic     Status: Abnormal   Collection Time: 01/31/14 11:35 AM  Result Value Ref Range   Color, Urine YELLOW YELLOW   APPearance CLOUDY (A) CLEAR   Specific Gravity, Urine 1.017  1.005 - 1.030   pH 8.0 5.0 - 8.0   Glucose, UA 100 (A) NEGATIVE mg/dL   Hgb urine dipstick MODERATE (A) NEGATIVE   Bilirubin Urine NEGATIVE NEGATIVE   Ketones, ur NEGATIVE NEGATIVE mg/dL   Protein, ur 30 (A) NEGATIVE mg/dL   Urobilinogen, UA 0.2 0.0 - 1.0 mg/dL   Nitrite POSITIVE (A) NEGATIVE   Leukocytes, UA MODERATE (A) NEGATIVE  Urine microscopic-add on     Status: Abnormal   Collection Time: 01/31/14 11:35 AM  Result Value Ref Range   Squamous Epithelial / LPF RARE RARE   WBC, UA 3-6 <3 WBC/hpf   RBC / HPF 0-2 <3 RBC/hpf   Bacteria, UA MANY (A) RARE   Crystals CA OXALATE CRYSTALS (A) NEGATIVE    Comment: TRIPLE PHOSPHATE CRYSTALS  Troponin I (q 6hr x 3)     Status: Abnormal   Collection Time: 01/31/14  2:10 PM  Result Value Ref Range   Troponin I 3.14 (HH) <0.031 ng/mL    Comment:        POSSIBLE MYOCARDIAL ISCHEMIA. SERIAL TESTING RECOMMENDED. REPEATED TO VERIFY CRITICAL RESULT CALLED TO, READ BACK BY AND VERIFIED WITH: SAMANTHA HARDY,RN AT 01/31/14 BY ZBEECH.   Troponin I (q 6hr x 3)     Status: Abnormal   Collection Time: 01/31/14  8:21 PM  Result Value Ref Range   Troponin I 16.09 (HH) <0.031 ng/mL    Comment:        POSSIBLE MYOCARDIAL ISCHEMIA.  SERIAL TESTING RECOMMENDED. REPEATED TO VERIFY CRITICAL VALUE NOTED.  VALUE IS CONSISTENT WITH PREVIOUSLY REPORTED AND CALLED VALUE.   Heparin level (unfractionated)     Status: Abnormal   Collection Time: 01/31/14 10:06 PM  Result Value Ref Range   Heparin Unfractionated 0.79 (H) 0.30 - 0.70 IU/mL    Comment:        IF HEPARIN RESULTS ARE BELOW EXPECTED VALUES, AND PATIENT DOSAGE HAS BEEN CONFIRMED, SUGGEST FOLLOW UP TESTING OF ANTITHROMBIN III LEVELS.   CBC     Status: Abnormal   Collection Time: 02/01/14  8:50 AM  Result Value Ref Range   WBC 13.1 (H) 4.0 - 10.5 K/uL   RBC 4.72 4.22 - 5.81 MIL/uL   Hemoglobin 12.6 (L) 13.0 - 17.0 g/dL   HCT 47.1 (L) 25.2 - 71.2 %   MCV 78.8 78.0 - 100.0 fL   MCH 26.7 26.0 - 34.0 pg   MCHC 33.9 30.0 - 36.0 g/dL   RDW 92.9 (H) 09.0 - 30.1 %   Platelets 176 150 - 400 K/uL  Comprehensive metabolic panel     Status: Abnormal   Collection Time: 02/01/14  8:50 AM  Result Value Ref Range   Sodium 133 (L) 135 - 145 mmol/L   Potassium 3.8 3.5 - 5.1 mmol/L   Chloride 98 96 - 112 mmol/L   CO2 22 19 - 32 mmol/L   Glucose, Bld 179 (H) 70 - 99 mg/dL   BUN 12 6 - 23 mg/dL    Comment: REPEATED TO VERIFY   Creatinine, Ser 1.22 0.50 - 1.35 mg/dL    Comment: REPEATED TO VERIFY   Calcium 8.8 8.4 - 10.5 mg/dL   Total Protein 7.3 6.0 - 8.3 g/dL   Albumin 3.3 (L) 3.5 - 5.2 g/dL   AST 499 (H) 0 - 37 U/L   ALT 43 0 - 53 U/L   Alkaline Phosphatase 64 39 - 117 U/L   Total Bilirubin 0.6 0.3 - 1.2 mg/dL   GFR calc  non Af Amer 64 (L) >90 mL/min   GFR calc Af Amer 74 (L) >90 mL/min    Comment: (NOTE) The eGFR has been calculated using the CKD EPI equation. This calculation has not been validated in all clinical situations. eGFR's persistently <90 mL/min signify possible Chronic Kidney Disease.    Anion gap 13 5 - 15  Heparin level (unfractionated)     Status: None   Collection Time: 02/01/14  8:50 AM  Result Value Ref Range   Heparin Unfractionated 0.68  0.30 - 0.70 IU/mL    Comment:        IF HEPARIN RESULTS ARE BELOW EXPECTED VALUES, AND PATIENT DOSAGE HAS BEEN CONFIRMED, SUGGEST FOLLOW UP TESTING OF ANTITHROMBIN III LEVELS.   Lactic acid, plasma     Status: None   Collection Time: 02/01/14  8:50 AM  Result Value Ref Range   Lactic Acid, Venous 1.6 0.5 - 2.0 mmol/L    Comment: Please note change in reference range.    Imaging: Dg Chest Port 1 View  01/31/2014   CLINICAL DATA:  Chest pain  EXAM: PORTABLE CHEST - 1 VIEW  COMPARISON:  09/24/2012  FINDINGS: Stable cardiomegaly and high upper mediastinal widening. The upper mediastinal widening could be from goiter or ectatic vasculature. Mild rightward deviation of the trachea and subglottic narrowing is similar to previous, favoring goiter. Aortic contours are negative. There is no edema, consolidation, effusion, or pneumothorax.  IMPRESSION: 1. Stable exam.  No acute cardiopulmonary disease. 2. Question goiter with tracheal deviation.   Electronically Signed   By: Jorje Guild M.D.   On: 01/31/2014 03:58   Dg Abd Portable 1v  01/31/2014   CLINICAL DATA:  Distressed appearance.  EXAM: PORTABLE ABDOMEN - 1 VIEW  COMPARISON:  07/17/2012  FINDINGS: The stomach is moderately gas distended, but not clearly obstructed. There is no evidence of small or large bowel obstruction. No concerning intra-abdominal mass effect or calcification. Lung bases are clear.  IMPRESSION: Negative.   Electronically Signed   By: Jorje Guild M.D.   On: 01/31/2014 03:59    Assessment:  Principal Problem:   NSTEMI (non-ST elevated myocardial infarction) Active Problems:   Dysphasia   Diabetes mellitus with neurological manifestations, controlled   Chest pain at rest   Cardiomyopathy, ischemic   Aphasia   Plan:  1. NSTEMI with significant EKG changes - troponin elevated >16. LVEF is newly decreased to 35-40% with focal wall motion abnormalities. Would ordinarily recommend cath given these findings,  however, given his significant aphasis, bed-bound status and poor QOL, there is unlikely to be any survival benefit. Although I cannot be certain of his pain level at present, he appears comfortable. Would continue IV heparin for a total of 48 hours. He has a history of hemorrhagic stroke in the past and is not an ideal candidate for long-term anticoagulation, therefore, not a good PCI candidate. I think medical therapy would be best at this point. He is on lipitor 10 mg - check FLP in am and consider increasing. Increase aspirin to 325 mg daily for monotherapy. On lopressor 25 mg q6hrs. Consider adding losartan back tomorrow if renal function continues to improve.    Will touch base with the family at some point today to discuss goals of care - I called this morning, but was unable to reach them.  Time Spent Directly with Patient:  30 minutes  Length of Stay:  LOS: 1 day   Pixie Casino, MD, [facility] Attending Cardiologist [place]  HILTY,Kenneth C 02/01/2014, 10:27 AM

## 2014-02-01 NOTE — Progress Notes (Signed)
ANTICOAGULATION CONSULT NOTE - Initial Consult  Pharmacy Consult for heparin Indication: chest pain/ACS  No Known Allergies  Patient Measurements: Height: 5\' 7"  (170.2 cm) Weight: 159 lb (72.122 kg) IBW/kg (Calculated) : 66.1  Vital Signs: Temp: 100.7 F (38.2 C) (01/23 0445) Temp Source: Oral (01/23 0445) BP: 176/106 mmHg (01/23 0824) Pulse Rate: 107 (01/23 0824)  Labs:  Recent Labs  01/31/14 0310 01/31/14 0410 01/31/14 0945 01/31/14 1410 01/31/14 2021 01/31/14 2206 02/01/14 0850  HGB  --  13.5  --   --   --   --  12.6*  HCT  --  40.1  --   --   --   --  37.2*  PLT  --  203  --   --   --   --  176  HEPARINUNFRC  --   --   --   --   --  0.79* 0.68  CREATININE 1.87*  --   --   --   --   --   --   TROPONINI  --   --  0.71* 3.14* 16.09*  --   --     Estimated Creatinine Clearance: 40.3 mL/min (by C-G formula based on Cr of 1.87).   Medical History: Past Medical History  Diagnosis Date  . Stroke 2007  . Hypertension   . Difficult airway for intubation 06/22/2012    Deep and anterior. Difficult with standard laryngoscope. Easily intubated with Glidescope   . Coronary artery disease     Stent placement  . Malignant hypertension 07/14/2012  . 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 MSSA (methicillin susceptible Staphylococcus aureus) pneumonia 07/03/2012  . CVA (cerebral infarction) 06/22/2012  . Seizure, possible 06/22/2012  . Anxiety state, unspecified   . Zygomatic hyperplasia   . Abnormality of gait 10/03/2013    Medications:  Prescriptions prior to admission  Medication Sig Dispense Refill Last Dose  . acetaminophen (TYLENOL) 325 MG tablet Give 650 mg by tube every 6 (six) hours as needed (elevated temperature).   01/31/2014 at Unknown time  . aspirin 81 MG chewable tablet Chew 81 mg by mouth daily.   01/30/2014 at Unknown time  . chlorthalidone (HYGROTON) 25 MG tablet Take 12.5 mg by mouth daily.   01/30/2014 at Unknown time  . clonazePAM  (KLONOPIN) 0.5 MG tablet Take one tablet by mouth every 12 hours for depression 60 tablet 5 01/30/2014 at Unknown time  . cloNIDine (CATAPRES - DOSED IN MG/24 HR) 0.3 mg/24hr patch Place 1 patch onto the skin once a week.   01/24/2014  . divalproex (DEPAKOTE SPRINKLE) 125 MG capsule Take 375 mg by mouth 2 (two) times daily.    01/30/2014 at Unknown time  . DULoxetine (CYMBALTA) 30 MG capsule Take 30 mg by mouth daily.   01/30/2014 at Unknown time  . hydrALAZINE (APRESOLINE) 50 MG tablet Take 50 mg by mouth every 6 (six) hours. Also 50 mg twice daily for b/p>160/90 as needed   01/30/2014 at Unknown time  . isosorbide dinitrate (ISORDIL) 40 MG tablet Take 80 mg by mouth 3 (three) times daily.   01/30/2014 at Unknown time  . levETIRAcetam (KEPPRA) 500 MG tablet Take 250 mg by mouth every 12 (twelve) hours.    01/30/2014 at Unknown time  . losartan (COZAAR) 50 MG tablet Take 100 mg by mouth daily.   01/30/2014 at Unknown time  . metFORMIN (GLUCOPHAGE) 500 MG tablet Take 500 mg by mouth 2 (two) times daily with a meal.  01/30/2014 at Unknown time  . metoprolol tartrate (LOPRESSOR) 25 MG tablet Take 1 tablet (25 mg total) by mouth every 6 (six) hours. 120 tablet 3 01/30/2014 at 2400  . omeprazole (PRILOSEC) 20 MG capsule Take 20 mg by mouth daily.   01/30/2014 at Unknown time  . polyethylene glycol (MIRALAX / GLYCOLAX) packet Take 17 g by mouth 2 (two) times daily.   01/30/2014 at Unknown time  . polyvinyl alcohol (LIQUIFILM TEARS) 1.4 % ophthalmic solution Place 1 drop into both eyes 2 (two) times daily.   01/30/2014 at Unknown time  . sennosides-docusate sodium (SENOKOT-S) 8.6-50 MG tablet Take 1 tablet by mouth daily.   01/30/2014 at Unknown time  . Vitamin D, Ergocalciferol, (DRISDOL) 50000 UNITS CAPS capsule Take 50,000 Units by mouth every 30 (thirty) days.   01/16/14   Scheduled:  . aspirin EC  81 mg Oral Daily  . atorvastatin  10 mg Oral Daily  . cefTRIAXone (ROCEPHIN)  IV  1 g Intravenous Q24H  . cloNIDine   0.3 mg Transdermal Q Fri  . divalproex  375 mg Oral BID  . DULoxetine  30 mg Oral Daily  . hydrALAZINE  50 mg Oral Q6H  . [START ON 02/02/2014] Influenza vac split quadrivalent PF  0.5 mL Intramuscular Tomorrow-1000  . levETIRAcetam  250 mg Oral Q12H  . metoprolol tartrate  25 mg Oral Q6H  . pantoprazole  80 mg Oral Daily  . [START ON 02/02/2014] pneumococcal 23 valent vaccine  0.5 mL Intramuscular Tomorrow-1000  . polyethylene glycol  17 g Oral Daily  . polyvinyl alcohol  1 drop Both Eyes BID  . senna-docusate  1 tablet Oral Daily  . sodium chloride  3 mL Intravenous Q12H  . [START ON 02/16/2014] Vitamin D (Ergocalciferol)  50,000 Units Oral Q30 days    Assessment: 59 yo male here with CP (he is noted with history of  PCI with stent) to begin heparin for r/o ACS (max troponin= 16.09). Initial HL was elevated at 0.79 and after decrease in rate HL is now at upper end of goal range at 0.68. H/H and plt have trended down slightly but no reported s/s of significant bleeding.   Goal of Therapy:  Heparin level 0.3-0.7 units/ml Monitor platelets by anticoagulation protocol: Yes   Plan:  - Decrease heparin gtt slightly to 850 u/hr to maintain in goal range - Monitor 6 hour HL and daily HL/CBC thereafter - F/u plans for cath  [place] K. Bonnye Fava, PharmD Clinical Pharmacist - Resident Pager: [phone] Pharmacy: [phone] 02/01/2014 9:53 AM

## 2014-02-01 NOTE — Progress Notes (Signed)
Pt noted to have rhythm change on monitor, appears to be accelerated  junctional with rate 99. Pt is resting quietly at this time, has had several episodes of moaning through the night and unable to assess pain due to aphasia. Morphine 2 mg given twice and klonopin x1. Cardiology fellow notified of new findings,awaiting return response and Ekg being obtained.

## 2014-02-01 NOTE — Progress Notes (Signed)
PROGRESS NOTE  [name] ZOX:[id number] DOB: April 18, 1955 DOA: 01/31/2014 PCP: Terald SleeperOBSON,MICHAEL GAVIN, MD  Assessment/Plan: NSTEMI- heparin gtt, cards consult, ? Cath vs medical management- suspect medical management would be best based on patient's baseline  UTI- culture, IV rocephin  Lactic acidosis- resolved with IVF  Constipation- PRN suppositories  HTN- IV PRN, resume home meds  Code Status: full Family Communication: patient Disposition Plan: family would like SNF at d/c   Consultants:  cards  Procedures:     HPI/Subjective: Resting, son says patient still calls out in pain  Objective: Filed Vitals:   02/01/14 0824  BP: 176/106  Pulse: 107  Temp:   Resp:     Intake/Output Summary (Last 24 hours) at 02/01/14 1057 Last data filed at 02/01/14 1027  Gross per 24 hour  Intake 2517.02 ml  Output   1925 ml  Net 592.02 ml   Filed Weights   01/31/14 0711 02/01/14 0445  Weight: 71.396 kg (157 lb 6.4 oz) 72.122 kg (159 lb)    Exam:   General:  Will open eyes to voice- does not follow commands  Cardiovascular: rrr  Respiratory: clear  Abdomen: +BS, soft   Data Reviewed: Basic Metabolic Panel:  Recent Labs Lab 01/31/14 0310 02/01/14 0850  NA 138 133*  K 4.9 3.8  CL 100 98  CO2 23 22  GLUCOSE 182* 179*  BUN 26* 12  CREATININE 1.87* 1.22  CALCIUM 9.5 8.8   Liver Function Tests:  Recent Labs Lab 01/31/14 0310 02/01/14 0850  AST 36 170*  ALT 28 43  ALKPHOS 69 64  BILITOT 0.7 0.6  PROT 7.7 7.3  ALBUMIN 3.8 3.3*    Recent Labs Lab 01/31/14 0310  LIPASE 49   No results for input(s): AMMONIA in the last 168 hours. CBC:  Recent Labs Lab 01/31/14 0410 02/01/14 0850  WBC 8.2 13.1*  HGB 13.5 12.6*  HCT 40.1 37.2*  MCV 79.2 78.8  PLT 203 176   Cardiac Enzymes:  Recent Labs Lab 01/31/14 0945 01/31/14 1410 01/31/14 2021  TROPONINI 0.71* 3.14* 16.09*   BNP (last 3 results) No results for input(s): PROBNP in the last  8760 hours. CBG: No results for input(s): GLUCAP in the last 168 hours.  Recent Results (from the past 240 hour(s))  MRSA PCR Screening     Status: None   Collection Time: 01/31/14  7:20 AM  Result Value Ref Range Status   MRSA by PCR NEGATIVE NEGATIVE Final    Comment:        The GeneXpert MRSA Assay (FDA approved for NASAL specimens only), is one component of a comprehensive MRSA colonization surveillance program. It is not intended to diagnose MRSA infection nor to guide or monitor treatment for MRSA infections.      Studies: Dg Chest Port 1 View  01/31/2014   CLINICAL DATA:  Chest pain  EXAM: PORTABLE CHEST - 1 VIEW  COMPARISON:  09/24/2012  FINDINGS: Stable cardiomegaly and high upper mediastinal widening. The upper mediastinal widening could be from goiter or ectatic vasculature. Mild rightward deviation of the trachea and subglottic narrowing is similar to previous, favoring goiter. Aortic contours are negative. There is no edema, consolidation, effusion, or pneumothorax.  IMPRESSION: 1. Stable exam.  No acute cardiopulmonary disease. 2. Question goiter with tracheal deviation.   Electronically Signed   By: Tiburcio PeaJonathan  Watts M.D.   On: 01/31/2014 03:58   Dg Abd Portable 1v  01/31/2014   CLINICAL DATA:  Distressed appearance.  EXAM: PORTABLE ABDOMEN -  1 VIEW  COMPARISON:  07/17/2012  FINDINGS: The stomach is moderately gas distended, but not clearly obstructed. There is no evidence of small or large bowel obstruction. No concerning intra-abdominal mass effect or calcification. Lung bases are clear.  IMPRESSION: Negative.   Electronically Signed   By: Tiburcio Pea M.D.   On: 01/31/2014 03:59    Scheduled Meds: . [START ON 02/02/2014] aspirin EC  325 mg Oral Daily  . atorvastatin  10 mg Oral Daily  . cefTRIAXone (ROCEPHIN)  IV  1 g Intravenous Q24H  . cloNIDine  0.3 mg Transdermal Q Fri  . divalproex  375 mg Oral BID  . DULoxetine  30 mg Oral Daily  . hydrALAZINE  50 mg Oral  Q6H  . [START ON 02/02/2014] Influenza vac split quadrivalent PF  0.5 mL Intramuscular Tomorrow-1000  . levETIRAcetam  250 mg Oral Q12H  . metoprolol tartrate  25 mg Oral Q6H  . pantoprazole  80 mg Oral Daily  . [START ON 02/02/2014] pneumococcal 23 valent vaccine  0.5 mL Intramuscular Tomorrow-1000  . polyethylene glycol  17 g Oral Daily  . polyvinyl alcohol  1 drop Both Eyes BID  . senna-docusate  1 tablet Oral Daily  . sodium chloride  3 mL Intravenous Q12H  . [START ON 02/16/2014] Vitamin D (Ergocalciferol)  50,000 Units Oral Q30 days   Continuous Infusions: . sodium chloride 100 mL (02/01/14 1047)  . heparin 850 Units/hr (02/01/14 1045)  . nitroGLYCERIN Stopped (01/31/14 1123)   Antibiotics Given (last 72 hours)    Date/Time Action Medication Dose Rate   02/01/14 1026 Given   cefTRIAXone (ROCEPHIN) 1 g in dextrose 5 % 50 mL IVPB - Premix 1 g 100 mL/hr      Principal Problem:   NSTEMI (non-ST elevated myocardial infarction) Active Problems:   Late effects of CVA (cerebrovascular accident)   Dysphasia   Diabetes mellitus with neurological manifestations, controlled   Chest pain at rest   Cardiomyopathy, ischemic   Aphasia   UTI (urinary tract infection)    Time spent: 35 min    Tamas Suen  [facility] Pager [phone]. If 7PM-7AM, please contact night-coverage at [email or web address], password [facility] 02/01/2014, 10:57 AM  LOS: 1 day

## 2014-02-01 NOTE — Progress Notes (Signed)
PT Cancellation Note  Patient Details Name: [name] MRN: [id number] DOB: 01-21-1955   Cancelled Treatment:    Reason Eval/Treat Not Completed: Medical issues which prohibited therapy Patient with upward trending troponins. Will return at later time for PT evaluation as appropriate.  [address]Wynonna Fitzhenry Secor McCaulleyBarbour, [place] CarolinaPT [phone]  Berton MountBarbour, Deangela Randleman S 02/01/2014, 7:54 AM

## 2014-02-02 DIAGNOSIS — I6990 Unspecified sequelae of unspecified cerebrovascular disease: Secondary | ICD-10-CM

## 2014-02-02 DIAGNOSIS — I10 Essential (primary) hypertension: Secondary | ICD-10-CM | POA: Diagnosis present

## 2014-02-02 DIAGNOSIS — I471 Supraventricular tachycardia: Secondary | ICD-10-CM | POA: Diagnosis not present

## 2014-02-02 DIAGNOSIS — N179 Acute kidney failure, unspecified: Secondary | ICD-10-CM

## 2014-02-02 DIAGNOSIS — N390 Urinary tract infection, site not specified: Secondary | ICD-10-CM

## 2014-02-02 LAB — URINE CULTURE
CULTURE: NO GROWTH
Colony Count: NO GROWTH

## 2014-02-02 LAB — CBC
HEMATOCRIT: 33.6 % — AB (ref 39.0–52.0)
Hemoglobin: 11.2 g/dL — ABNORMAL LOW (ref 13.0–17.0)
MCH: 26.2 pg (ref 26.0–34.0)
MCHC: 33.3 g/dL (ref 30.0–36.0)
MCV: 78.5 fL (ref 78.0–100.0)
Platelets: 149 10*3/uL — ABNORMAL LOW (ref 150–400)
RBC: 4.28 MIL/uL (ref 4.22–5.81)
RDW: 16.1 % — ABNORMAL HIGH (ref 11.5–15.5)
WBC: 14.3 10*3/uL — AB (ref 4.0–10.5)

## 2014-02-02 LAB — GLUCOSE, CAPILLARY
Glucose-Capillary: 128 mg/dL — ABNORMAL HIGH (ref 70–99)
Glucose-Capillary: 109 mg/dL — ABNORMAL HIGH (ref 70–99)
Glucose-Capillary: 112 mg/dL — ABNORMAL HIGH (ref 70–99)

## 2014-02-02 LAB — HEPARIN LEVEL (UNFRACTIONATED)
HEPARIN UNFRACTIONATED: 0.2 [IU]/mL — AB (ref 0.30–0.70)
Heparin Unfractionated: 0.24 IU/mL — ABNORMAL LOW (ref 0.30–0.70)

## 2014-02-02 MED ORDER — INSULIN ASPART 100 UNIT/ML ~~LOC~~ SOLN
0.0000 [IU] | Freq: Three times a day (TID) | SUBCUTANEOUS | Status: DC
  Administered 2014-02-02: 1 [IU] via SUBCUTANEOUS

## 2014-02-02 MED ORDER — ISOSORBIDE DINITRATE 10 MG PO TABS: 80.0000 mg | ORAL_TABLET | Freq: Three times a day (TID) | ORAL | Status: DC

## 2014-02-02 MED ORDER — INSULIN ASPART 100 UNIT/ML ~~LOC~~ SOLN: 0.0000 [IU] | Freq: Every day | SUBCUTANEOUS | Status: DC

## 2014-02-02 MED ORDER — LOSARTAN POTASSIUM 25 MG PO TABS
25.0000 mg | ORAL_TABLET | Freq: Every day | ORAL | Status: DC
  Administered 2014-02-02 – 2014-02-04 (×2): 25 mg via ORAL
  Filled 2014-02-02 (×4): qty 1

## 2014-02-02 MED ORDER — HEPARIN (PORCINE) IN NACL 100-0.45 UNIT/ML-% IJ SOLN: 950.0000 [IU]/h | INTRAMUSCULAR | Status: DC

## 2014-02-02 MED ORDER — ISOSORBIDE DINITRATE 10 MG PO TABS
20.0000 mg | ORAL_TABLET | Freq: Three times a day (TID) | ORAL | Status: DC
  Administered 2014-02-02: 20 mg via ORAL
  Filled 2014-02-02 (×2): qty 2

## 2014-02-02 NOTE — Evaluation (Addendum)
Physical Therapy Evaluation Patient Details Name: [name] MRN: [id number] DOB: August 02, 1955 Today's Date: 02/02/2014   History of Present Illness  59 y.o. male with history of stroke and gobal aphasia, admitted with NSTEMI, UTI, and lactic acidosis.  Clinical Impression  Pt admitted with the above complications. Pt currently with functional limitations due to the deficits listed below (see PT Problem List). Responds to visual threat and noxious stimuli of extremities with delay. Per family, pt is fully dependent with a hoyer lift for transfers and all ADLs. Pt was unable to follow commands for motor tasks but opens his eyes when spoken to. Moans with discomfort during passive stretching Rt>Lt with notable tightness throughout LEs. Highly recommend PRAFOs BIL to reduce risk of plantarflexion contractures and skin breakdown of heels. Pt will benefit from skilled PT to increase their independence and safety with mobility to allow discharge to the venue listed below.       Follow Up Recommendations SNF    Equipment Recommendations  Other (comment) (Bilater PRAFOs)    Recommendations for Other Services       Precautions / Restrictions Precautions Precautions: Fall Restrictions Weight Bearing Restrictions: No      Mobility  Bed Mobility                  Transfers                    Ambulation/Gait                Stairs            Wheelchair Mobility    Modified Rankin (Stroke Patients Only)       Balance                                             Pertinent Vitals/Pain Pain Assessment:  (CPOT 5/8 with stretching)    Home Living Family/patient expects to be discharged to:: Skilled nursing facility   Available Help at Discharge: Skilled Nursing Facility Type of Home: Skilled Nursing Facility         Home Equipment: Other (comment) Water quality scientist(Hoyer lift)      Prior Function Level of Independence: Needs assistance   Gait /  Transfers Assistance Needed: non ambulatory  ADL's / Homemaking Assistance Needed: dependent with all ADLs        Hand Dominance   Dominant Hand: Right    Extremity/Trunk Assessment   Upper Extremity Assessment: Defer to OT evaluation           Lower Extremity Assessment: Difficult to assess due to impaired cognition         Communication   Communication: Expressive difficulties  Cognition Arousal/Alertness: Lethargic Behavior During Therapy: Flat affect Overall Cognitive Status: History of cognitive impairments - at baseline                      General Comments General comments (skin integrity, edema, etc.): Per family, patient was ambulatory with mild communication deficits approximately 1.5 years ago after his stroke with primarily affected his Lt sided motor function. He later became non-ambulatory and his communication progressively declined. They attempted to take care of him at home for a trial away from SNF. They state he was dependent with all care and a hoyer lift was used for transfers out of bed. I spent time discussing  plan of care, equipment needs, and d/c recommendations with family, as well as their expected goals for patient. The family is hoping pt will return to rehab to regain some ability mobilize and communicate with family again. Ultimately they would like him to return home if he shows signs of progression.    Exercises Other Exercises Other Exercises: Passive ankle pumps x 10 with prolonged stretch of soleus and gastrocnemius muscles. Pt with notable clonus that resolves after approximately 10 seconds. Pt moans with discomfort during passive stretch of Rt ankle, notably more restricted in motion than Left. Other Exercises: Passive knee extension with hold x 30 seconds each LE, passive external and internal rotation of hips x 1 minute each LE. Pt moans with discomfort during most RLE ROM. PROM into hip flexion.      Assessment/Plan    PT  Assessment Patient needs continued PT services  PT Diagnosis Difficulty walking;Quadraplegia   PT Problem List Decreased strength;Decreased range of motion;Decreased activity tolerance;Decreased balance;Decreased mobility;Decreased coordination;Decreased cognition;Decreased knowledge of use of DME;Decreased safety awareness;Decreased knowledge of precautions;Cardiopulmonary status limiting activity;Impaired tone  PT Treatment Interventions Functional mobility training;Therapeutic activities;Therapeutic exercise;Balance training;Neuromuscular re-education;Cognitive remediation;Patient/family education;Modalities   PT Goals (Current goals can be found in the Care Plan section) Acute Rehab PT Goals Patient Stated Goal: none stated PT Goal Formulation: With family Time For Goal Achievement: 02/16/14 Potential to Achieve Goals: Poor    Frequency Min 2X/week   Barriers to discharge        Co-evaluation               End of Session   Activity Tolerance: Other (comment) (Limited by cognition) Patient left: in bed;with call bell/phone within reach;with family/visitor present Nurse Communication: Need for lift equipment;Mobility status;Other (comment) (Start positioning schedule q2h to prevent skin breakdown)         Time: 1735-1800 PT Time Calculation (min) (ACUTE ONLY): 25 min   Charges:   PT Evaluation $Initial PT Evaluation Tier I: 1 Procedure PT Treatments $Therapeutic Exercise: 8-22 mins   PT G Codes:        Berton Mount 02/02/2014, 7:00 PM [facility] [place], [place] [phone]   Addendum for PRAFOs

## 2014-02-02 NOTE — Progress Notes (Signed)
PROGRESS NOTE  [name] [id number] DOB: 10-26-1955 DOA: 01/31/2014 PCP: Antonio SleeperOBSON,Antonio GAVIN, MD  Assessment/Plan: Chart reviewed. Discussed with Dr. Rennis GoldenHilty  NSTEMI- appears comfortable.  Medical management.  Heparin stopped.  UTI- culture pending, continue IV rocephin  Lactic acidosis- resolved with IVF. Metformin stopped  Constipation- PRN suppositories  HTN- controlled. Will transition nitro drip back to Isordil at lower dose.  Diabetes type 2:  CBGs not being checked. Will check and start sliding scale insulin. Change diet to heart healthy carb modified.  Ischemic cardiomyopathy: No evidence of heart failure. Resume ARB at lower dose and monitor blood pressure and creatinine.  Acute renal failure: Creatinine improving. Monitor.  Code Status: full Family Communication: Sister and son at bedside Disposition Plan: family would like SNF at d/c. [facility]   Consultants:  cardiology  Procedures:     HPI/Subjective: Resting, no reports of discomfort. 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 Son reports that he is total care, chair and bedbound. Requires assistance with eating and all activities of daily living.  Objective: Filed Vitals:   02/02/14 0910  BP: 116/73  Pulse:   Temp:   Resp:     Intake/Output Summary (Last 24 hours) at 02/02/14 1026 Last data filed at 02/02/14 [id number]  Gross per 24 hour  Intake 1535.64 ml  Output    950 ml  Net 585.64 ml   Filed Weights   01/31/14 0711 02/01/14 0445 02/02/14 0524  Weight: 71.396 kg (157 lb 6.4 oz) 72.122 kg (159 lb) 73.029 kg (161 lb)    Exam:   General:  Asleep. Briefly arousable. Appears comfortable. Does not speak or follow commands.  Cardiovascular: rrr without murmurs gallops rubs  Respiratory: clear to auscultation without wheezes rhonchi or rales  Abdomen: +BS, soft, nontender  nondistended  Extremities: No clubbing cyanosis or edema   Data Reviewed: Basic Metabolic Panel:  Recent Labs Lab 01/31/14 0310 02/01/14 0850  NA 138 133*  K 4.9 3.8  CL 100 98  CO2 23 22  GLUCOSE 182* 179*  BUN 26* 12  CREATININE 1.87* 1.22  CALCIUM 9.5 8.8   Liver Function Tests:  Recent Labs Lab 01/31/14 0310 02/01/14 0850  AST 36 170*  ALT 28 43  ALKPHOS 69 64  BILITOT 0.7 0.6  PROT 7.7 7.3  ALBUMIN 3.8 3.3*    Recent Labs Lab 01/31/14 0310  LIPASE 49   No results for input(s): AMMONIA in the last 168 hours. CBC:  Recent Labs Lab 01/31/14 0410 02/01/14 0850 02/02/14 0349  WBC 8.2 13.1* 14.3*  HGB 13.5 12.6* 11.2*  HCT 40.1 37.2* 33.6*  MCV 79.2 78.8 78.5  PLT 203 176 149*   Cardiac Enzymes:  Recent Labs Lab 01/31/14 0945 01/31/14 1410 01/31/14 2021  TROPONINI 0.71* 3.14* 16.09*   BNP (last 3 results) No results for input(s): PROBNP in the last 8760 hours. CBG: No results for input(s): GLUCAP in the last 168 hours.  Recent Results (from the past 240 hour(s))  MRSA PCR Screening     Status: None   Collection Time: 01/31/14  7:20 AM  Result Value Ref Range Status   MRSA by PCR NEGATIVE NEGATIVE Final    Comment:        The GeneXpert MRSA Assay (FDA approved for NASAL specimens only), is one component of a comprehensive MRSA colonization surveillance program. It is not intended to diagnose MRSA infection nor  to guide or monitor treatment for MRSA infections.      Studies: No results found.  Scheduled Meds: . aspirin EC  325 mg Oral Daily  . atorvastatin  10 mg Oral Daily  . cefTRIAXone (ROCEPHIN)  IV  1 g Intravenous Q24H  . cloNIDine  0.3 mg Transdermal Q Fri  . divalproex  375 mg Oral BID  . DULoxetine  30 mg Oral Daily  . hydrALAZINE  50 mg Oral Q6H  . Influenza vac split quadrivalent PF  0.5 mL Intramuscular Tomorrow-1000  . levETIRAcetam  250 mg Oral Q12H  . metoprolol tartrate  50 mg Oral Q6H  . pantoprazole  80  mg Oral Daily  . pneumococcal 23 valent vaccine  0.5 mL Intramuscular Tomorrow-1000  . polyethylene glycol  17 g Oral Daily  . polyvinyl alcohol  1 drop Both Eyes BID  . senna-docusate  1 tablet Oral Daily  . sodium chloride  3 mL Intravenous Q12H  . [START ON 02/16/2014] Vitamin D (Ergocalciferol)  50,000 Units Oral Q30 days   Continuous Infusions: . sodium chloride 100 mL/hr (02/02/14 0628)  . heparin 950 Units/hr (02/02/14 1191)  . nitroGLYCERIN Stopped (01/31/14 1123)   Antibiotics Given (last 72 hours)    Date/Time Action Medication Dose Rate   02/01/14 1026 Given   cefTRIAXone (ROCEPHIN) 1 g in dextrose 5 % 50 mL IVPB - Premix 1 g 100 mL/hr   02/02/14 0932 Given   cefTRIAXone (ROCEPHIN) 1 g in dextrose 5 % 50 mL IVPB - Premix 1 g 100 mL/hr      Time spent: 35 min  [name] L  [facility] [email or web address], password [facility] 02/02/2014, 10:26 AM  LOS: 2 days

## 2014-02-02 NOTE — Progress Notes (Signed)
PT Cancellation Note  Patient Details Name: [name] MRN: [id number] DOB: 1955-07-15   Cancelled Treatment:    Reason Eval/Treat Not Completed: Medical issues which prohibited therapy Patient'Oneill last troponin lab 1/22 >16.00. Noted cardiology recommending to treat NSTEMI with medical management. Typically physical therapy evaluations are held until labs show a downward trend in troponin or cardiology clears from a medical standpoint for treatment. Will contact attending MD and follow up as appropriate.   [address]Logan Secor Oneill, [name], [name] 02/02/2014, 5:21 PM

## 2014-02-02 NOTE — Progress Notes (Addendum)
DAILY PROGRESS NOTE  Subjective:  No events noted overnight. White blood cell count continues to climb - platelets are declining (<50%) - will need to watch for HIT.  No complaints of chest pain.  12-15 beat run of SVT noted overnight.  Objective:  Temp:  [99.4 F (37.4 C)-100.9 F (38.3 C)] 99.9 F (37.7 C) (01/24 0746) Pulse Rate:  [74-122] 96 (01/24 0746) Resp:  [17-32] 19 (01/24 0746) BP: (104-150)/(57-97) 116/73 mmHg (01/24 0910) SpO2:  [94 %-98 %] 94 % (01/24 0746) Weight:  [161 lb (73.029 kg)] 161 lb (73.029 kg) (01/24 0524) Weight change: 3 lb 9.6 oz (1.633 kg)  Intake/Output from previous day: 01/23 0701 - 01/24 0700 In: 1655.6 [P.O.:300; I.V.:1355.6] Out: 950 [Urine:950]  Intake/Output from this shift:    Medications: Current Facility-Administered Medications  Medication Dose Route Frequency Provider Last Rate Last Dose  . 0.9 %  sodium chloride infusion   Intravenous Continuous Orvan Falconer, MD 100 mL/hr at 02/02/14 0628 100 mL/hr at 02/02/14 7654  . aspirin EC tablet 325 mg  325 mg Oral Daily Pixie Casino, MD   325 mg at 02/02/14 0913  . atorvastatin (LIPITOR) tablet 10 mg  10 mg Oral Daily Orvan Falconer, MD   10 mg at 02/02/14 0916  . bisacodyl (DULCOLAX) suppository 10 mg  10 mg Rectal Daily PRN Geradine Girt, DO      . cefTRIAXone (ROCEPHIN) 1 g in dextrose 5 % 50 mL IVPB - Premix  1 g Intravenous Q24H Jessica U Vann, DO   1 g at 02/01/14 1026  . clonazePAM (KLONOPIN) tablet 0.5 mg  0.5 mg Oral BID PRN Orvan Falconer, MD   0.5 mg at 02/01/14 2121  . cloNIDine (CATAPRES - Dosed in mg/24 hr) patch 0.3 mg  0.3 mg Transdermal Q Fri Orvan Falconer, MD   0.3 mg at 01/31/14 1100  . divalproex (DEPAKOTE SPRINKLE) capsule 375 mg  375 mg Oral BID Orvan Falconer, MD   375 mg at 02/02/14 0908  . DULoxetine (CYMBALTA) DR capsule 30 mg  30 mg Oral Daily Orvan Falconer, MD   30 mg at 02/02/14 0910  . heparin ADULT infusion 100 units/mL (25000 units/250 mL)  950 Units/hr Intravenous Continuous Adora Fridge, RPH 9.5 mL/hr at 02/02/14 6503 950 Units/hr at 02/02/14 5465  . hydrALAZINE (APRESOLINE) tablet 50 mg  50 mg Oral Q6H Orvan Falconer, MD   50 mg at 02/02/14 0910  . Influenza vac split quadrivalent PF (FLUARIX) injection 0.5 mL  0.5 mL Intramuscular Tomorrow-1000 Geradine Girt, DO      . levETIRAcetam (KEPPRA) tablet 250 mg  250 mg Oral Q12H Orvan Falconer, MD   250 mg at 02/02/14 0912  . metoprolol (LOPRESSOR) tablet 50 mg  50 mg Oral Q6H Jessica U Vann, DO   50 mg at 02/02/14 0916  . morphine 2 MG/ML injection 2 mg  2 mg Intravenous Q2H PRN Orvan Falconer, MD   2 mg at 02/01/14 1316  . nitroGLYCERIN 50 mg in dextrose 5 % 250 mL (0.2 mg/mL) infusion  2-200 mcg/min Intravenous Titrated Kalman Drape, MD   Stopped at 01/31/14 1123  . ondansetron (ZOFRAN) tablet 4 mg  4 mg Oral Q6H PRN Orvan Falconer, MD       Or  . ondansetron [facility]) injection 4 mg  4 mg Intravenous Q6H PRN Orvan Falconer, MD      . pantoprazole (PROTONIX) EC tablet 80 mg  80 mg Oral Daily Collier Salina  Marin Comment, MD   80 mg at 02/02/14 0913  . pneumococcal 23 valent vaccine (PNU-IMMUNE) injection 0.5 mL  0.5 mL Intramuscular Tomorrow-1000 Jessica U Vann, DO      . polyethylene glycol (MIRALAX / GLYCOLAX) packet 17 g  17 g Oral Daily Geradine Girt, DO   17 g at 02/02/14 0908  . polyvinyl alcohol (LIQUIFILM TEARS) 1.4 % ophthalmic solution 1 drop  1 drop Both Eyes BID Orvan Falconer, MD   1 drop at 02/02/14 0917  . senna-docusate (Senokot-S) tablet 1 tablet  1 tablet Oral Daily Orvan Falconer, MD   1 tablet at 02/02/14 [phone]  . sodium chloride 0.9 % injection 3 mL  3 mL Intravenous Q12H Orvan Falconer, MD   3 mL at 02/01/14 1027  . [START ON 02/16/2014] Vitamin D (Ergocalciferol) (DRISDOL) capsule 50,000 Units  50,000 Units Oral Q30 days Orvan Falconer, MD        Physical Exam: General appearance: alert and no verbal communication Lungs: clear to auscultation bilaterally Heart: regular rate and rhythm Extremities: extremities normal, atraumatic, no cyanosis or edema Pulses: 2+ and  symmetric Neurologic: Mental status: Eyes open, does not follow commands or speak  Lab Results: Results for orders placed or performed during the hospital encounter of 01/31/14 (from the past 48 hour(s))  Troponin I (q 6hr x 3)     Status: Abnormal   Collection Time: 01/31/14  9:45 AM  Result Value Ref Range   Troponin I 0.71 (HH) <0.031 ng/mL    Comment:        POSSIBLE MYOCARDIAL ISCHEMIA. SERIAL TESTING RECOMMENDED. REPEATED TO VERIFY CRITICAL RESULT CALLED TO, READ BACK BY AND VERIFIED WITH: S.HARDY,RN 01/31/14 1104 CLARK,S   TSH     Status: None   Collection Time: 01/31/14  9:45 AM  Result Value Ref Range   TSH 2.761 0.350 - 4.500 uIU/mL  Hemoglobin A1c     Status: Abnormal   Collection Time: 01/31/14  9:45 AM  Result Value Ref Range   Hgb A1c MFr Bld 6.6 (H) <5.7 %    Comment: (NOTE)                                                                       According to the ADA Clinical Practice Recommendations for 2011, when HbA1c is used as a screening test:  >=6.5%   Diagnostic of Diabetes Mellitus           (if abnormal result is confirmed) 5.7-6.4%   Increased risk of developing Diabetes Mellitus References:Diagnosis and Classification of Diabetes Mellitus,Diabetes DVVO,1607,37(TGGYI 1):S62-S69 and Standards of Medical Care in         Diabetes - 2011,Diabetes Care,2011,34 (Suppl 1):S11-S61.    Mean Plasma Glucose 143 (H) <117 mg/dL    Comment: Performed at [facility]  Urinalysis, Routine w reflex microscopic     Status: Abnormal   Collection Time: 01/31/14 11:35 AM  Result Value Ref Range   Color, Urine YELLOW YELLOW   APPearance CLOUDY (A) CLEAR   Specific Gravity, Urine 1.017 1.005 - 1.030   pH 8.0 5.0 - 8.0   Glucose, UA 100 (A) NEGATIVE mg/dL   Hgb urine dipstick MODERATE (A) NEGATIVE   Bilirubin Urine NEGATIVE NEGATIVE  Ketones, ur NEGATIVE NEGATIVE mg/dL   Protein, ur 30 (A) NEGATIVE mg/dL   Urobilinogen, UA 0.2 0.0 - 1.0 mg/dL   Nitrite  POSITIVE (A) NEGATIVE   Leukocytes, UA MODERATE (A) NEGATIVE  Urine microscopic-add on     Status: Abnormal   Collection Time: 01/31/14 11:35 AM  Result Value Ref Range   Squamous Epithelial / LPF RARE RARE   WBC, UA 3-6 <3 WBC/hpf   RBC / HPF 0-2 <3 RBC/hpf   Bacteria, UA MANY (A) RARE   Crystals CA OXALATE CRYSTALS (A) NEGATIVE    Comment: TRIPLE PHOSPHATE CRYSTALS  Troponin I (q 6hr x 3)     Status: Abnormal   Collection Time: 01/31/14  2:10 PM  Result Value Ref Range   Troponin I 3.14 (HH) <0.031 ng/mL    Comment:        POSSIBLE MYOCARDIAL ISCHEMIA. SERIAL TESTING RECOMMENDED. REPEATED TO VERIFY CRITICAL RESULT CALLED TO, READ BACK BY AND VERIFIED WITH: SAMANTHA HARDY,RN AT 01/31/14 BY ZBEECH.   Troponin I (q 6hr x 3)     Status: Abnormal   Collection Time: 01/31/14  8:21 PM  Result Value Ref Range   Troponin I 16.09 (HH) <0.031 ng/mL    Comment:        POSSIBLE MYOCARDIAL ISCHEMIA. SERIAL TESTING RECOMMENDED. REPEATED TO VERIFY CRITICAL VALUE NOTED.  VALUE IS CONSISTENT WITH PREVIOUSLY REPORTED AND CALLED VALUE.   Heparin level (unfractionated)     Status: Abnormal   Collection Time: 01/31/14 10:06 PM  Result Value Ref Range   Heparin Unfractionated 0.79 (H) 0.30 - 0.70 IU/mL    Comment:        IF HEPARIN RESULTS ARE BELOW EXPECTED VALUES, AND PATIENT DOSAGE HAS BEEN CONFIRMED, SUGGEST FOLLOW UP TESTING OF ANTITHROMBIN III LEVELS.   CBC     Status: Abnormal   Collection Time: 02/01/14  8:50 AM  Result Value Ref Range   WBC 13.1 (H) 4.0 - 10.5 K/uL   RBC 4.72 4.22 - 5.81 MIL/uL   Hemoglobin 12.6 (L) 13.0 - 17.0 g/dL   HCT 37.2 (L) 39.0 - 52.0 %   MCV 78.8 78.0 - 100.0 fL   MCH 26.7 26.0 - 34.0 pg   MCHC 33.9 30.0 - 36.0 g/dL   RDW 15.7 (H) 11.5 - 15.5 %   Platelets 176 150 - 400 K/uL  Comprehensive metabolic panel     Status: Abnormal   Collection Time: 02/01/14  8:50 AM  Result Value Ref Range   Sodium 133 (L) 135 - 145 mmol/L   Potassium 3.8 3.5 -  5.1 mmol/L   Chloride 98 96 - 112 mmol/L   CO2 22 19 - 32 mmol/L   Glucose, Bld 179 (H) 70 - 99 mg/dL   BUN 12 6 - 23 mg/dL    Comment: REPEATED TO VERIFY   Creatinine, Ser 1.22 0.50 - 1.35 mg/dL    Comment: REPEATED TO VERIFY   Calcium 8.8 8.4 - 10.5 mg/dL   Total Protein 7.3 6.0 - 8.3 g/dL   Albumin 3.3 (L) 3.5 - 5.2 g/dL   AST 170 (H) 0 - 37 U/L   ALT 43 0 - 53 U/L   Alkaline Phosphatase 64 39 - 117 U/L   Total Bilirubin 0.6 0.3 - 1.2 mg/dL   GFR calc non Af Amer 64 (L) >90 mL/min   GFR calc Af Amer 74 (L) >90 mL/min    Comment: (NOTE) The eGFR has been calculated using the CKD EPI equation. This  calculation has not been validated in all clinical situations. eGFR's persistently <90 mL/min signify possible Chronic Kidney Disease.    Anion gap 13 5 - 15  Heparin level (unfractionated)     Status: None   Collection Time: 02/01/14  8:50 AM  Result Value Ref Range   Heparin Unfractionated 0.68 0.30 - 0.70 IU/mL    Comment:        IF HEPARIN RESULTS ARE BELOW EXPECTED VALUES, AND PATIENT DOSAGE HAS BEEN CONFIRMED, SUGGEST FOLLOW UP TESTING OF ANTITHROMBIN III LEVELS.   Lactic acid, plasma     Status: None   Collection Time: 02/01/14  8:50 AM  Result Value Ref Range   Lactic Acid, Venous 1.6 0.5 - 2.0 mmol/L    Comment: Please note change in reference range.  Heparin level (unfractionated)     Status: None   Collection Time: 02/01/14  5:07 PM  Result Value Ref Range   Heparin Unfractionated 0.64 0.30 - 0.70 IU/mL    Comment:        IF HEPARIN RESULTS ARE BELOW EXPECTED VALUES, AND PATIENT DOSAGE HAS BEEN CONFIRMED, SUGGEST FOLLOW UP TESTING OF ANTITHROMBIN III LEVELS.   CBC     Status: Abnormal   Collection Time: 02/02/14  3:49 AM  Result Value Ref Range   WBC 14.3 (H) 4.0 - 10.5 K/uL   RBC 4.28 4.22 - 5.81 MIL/uL   Hemoglobin 11.2 (L) 13.0 - 17.0 g/dL   HCT 33.6 (L) 39.0 - 52.0 %   MCV 78.5 78.0 - 100.0 fL   MCH 26.2 26.0 - 34.0 pg   MCHC 33.3 30.0 - 36.0 g/dL    RDW 16.1 (H) 11.5 - 15.5 %   Platelets 149 (L) 150 - 400 K/uL  Heparin level (unfractionated)     Status: Abnormal   Collection Time: 02/02/14  3:49 AM  Result Value Ref Range   Heparin Unfractionated 0.20 (L) 0.30 - 0.70 IU/mL    Comment:        IF HEPARIN RESULTS ARE BELOW EXPECTED VALUES, AND PATIENT DOSAGE HAS BEEN CONFIRMED, SUGGEST FOLLOW UP TESTING OF ANTITHROMBIN III LEVELS.     Imaging: No results found.  Assessment:  Principal Problem:   NSTEMI (non-ST elevated myocardial infarction) Active Problems:   Late effects of CVA (cerebrovascular accident)   Dysphasia   Diabetes mellitus with neurological manifestations, controlled   Chest pain at rest   Cardiomyopathy, ischemic   Aphasia   UTI (urinary tract infection)   SVT (supraventricular tachycardia)   Plan:  NSTEMI with significant EKG changes - troponin elevated >16. LVEF is newly decreased to 35-40% with focal wall motion abnormalities. Now with declining platelet count - ?HIT.  Plan to d/c heparin today anyway. Would wait on long-term anticoagulation until he has rebound in his platelet count. Could consider plavix 75 mg daily at some point for medical therapy - but would reduce aspirin to 81 mg daily - will need to be careful due to history of prior hemorrhagic stroke - Effient would be contraindicated.  SVT noted overnight, short burst - self terminated - on metoprolol 50 mg q6hrs - not clear why he is having such frequent dosing - may need to increase, but will not adjust unless he has more arryhtmias. Discussed condition with his wife today - he is bedbound, aphasic and unlikely to derive any symptomatic benefit from cath/PCI.  Time Spent Directly with Patient:  15 minutes  Length of Stay:  LOS: 2 days   Pixie Casino,  MD, [facility] Attending Cardiologist [facility]  HILTY,Kenneth C 02/02/2014, 9:17 AM

## 2014-02-02 NOTE — Progress Notes (Addendum)
ANTICOAGULATION CONSULT NOTE  Pharmacy Consult for heparin Indication: chest pain/ACS  No Known Allergies  Patient Measurements: Height: 5\' 7"  (170.2 cm) Weight: 159 lb (72.122 kg) IBW/kg (Calculated) : 66.1  Vital Signs: Temp: 100.2 F (37.9 C) (01/24 0018) Temp Source: Axillary (01/24 0018) BP: 104/57 mmHg (01/24 0324) Pulse Rate: 74 (01/24 0324)  Labs:  Recent Labs  01/31/14 0310  01/31/14 0410 01/31/14 0945 01/31/14 1410 01/31/14 2021  02/01/14 0850 02/01/14 1707 02/02/14 0349  HGB  --   < > 13.5  --   --   --   --  12.6*  --  11.2*  HCT  --   --  40.1  --   --   --   --  37.2*  --  33.6*  PLT  --   --  203  --   --   --   --  176  --  149*  HEPARINUNFRC  --   --   --   --   --   --   < > 0.68 0.64 0.20*  CREATININE 1.87*  --   --   --   --   --   --  1.22  --   --   TROPONINI  --   --   --  0.71* 3.14* 16.09*  --   --   --   --   < > = values in this interval not displayed.  Estimated Creatinine Clearance: 61.7 mL/min (by C-G formula based on Cr of 1.22).   Medical History: Past Medical History  Diagnosis Date  . Stroke 2007  . Hypertension   . Difficult airway for intubation 06/22/2012    Deep and anterior. Difficult with standard laryngoscope. Easily intubated with Glidescope   . Coronary artery disease     Stent placement  . Malignant hypertension 07/14/2012  . 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 MSSA (methicillin susceptible Staphylococcus aureus) pneumonia 07/03/2012  . CVA (cerebral infarction) 06/22/2012  . Seizure, possible 06/22/2012  . Anxiety state, unspecified   . Zygomatic hyperplasia   . Abnormality of gait 10/03/2013    Medications:  Prescriptions prior to admission  Medication Sig Dispense Refill Last Dose  . acetaminophen (TYLENOL) 325 MG tablet Give 650 mg by tube every 6 (six) hours as needed (elevated temperature).   01/31/2014 at Unknown time  . aspirin 81 MG chewable tablet Chew 81 mg by mouth daily.   01/30/2014 at  Unknown time  . chlorthalidone (HYGROTON) 25 MG tablet Take 12.5 mg by mouth daily.   01/30/2014 at Unknown time  . clonazePAM (KLONOPIN) 0.5 MG tablet Take one tablet by mouth every 12 hours for depression 60 tablet 5 01/30/2014 at Unknown time  . cloNIDine (CATAPRES - DOSED IN MG/24 HR) 0.3 mg/24hr patch Place 1 patch onto the skin once a week.   01/24/2014  . divalproex (DEPAKOTE SPRINKLE) 125 MG capsule Take 375 mg by mouth 2 (two) times daily.    01/30/2014 at Unknown time  . DULoxetine (CYMBALTA) 30 MG capsule Take 30 mg by mouth daily.   01/30/2014 at Unknown time  . hydrALAZINE (APRESOLINE) 50 MG tablet Take 50 mg by mouth every 6 (six) hours. Also 50 mg twice daily for b/p>160/90 as needed   01/30/2014 at Unknown time  . isosorbide dinitrate (ISORDIL) 40 MG tablet Take 80 mg by mouth 3 (three) times daily.   01/30/2014 at Unknown time  . levETIRAcetam (KEPPRA) 500 MG tablet Take 250 mg  by mouth every 12 (twelve) hours.    01/30/2014 at Unknown time  . losartan (COZAAR) 50 MG tablet Take 100 mg by mouth daily.   01/30/2014 at Unknown time  . metFORMIN (GLUCOPHAGE) 500 MG tablet Take 500 mg by mouth 2 (two) times daily with a meal.   01/30/2014 at Unknown time  . metoprolol tartrate (LOPRESSOR) 25 MG tablet Take 1 tablet (25 mg total) by mouth every 6 (six) hours. 120 tablet 3 01/30/2014 at 2400  . omeprazole (PRILOSEC) 20 MG capsule Take 20 mg by mouth daily.   01/30/2014 at Unknown time  . polyethylene glycol (MIRALAX / GLYCOLAX) packet Take 17 g by mouth 2 (two) times daily.   01/30/2014 at Unknown time  . polyvinyl alcohol (LIQUIFILM TEARS) 1.4 % ophthalmic solution Place 1 drop into both eyes 2 (two) times daily.   01/30/2014 at Unknown time  . sennosides-docusate sodium (SENOKOT-S) 8.6-50 MG tablet Take 1 tablet by mouth daily.   01/30/2014 at Unknown time  . Vitamin D, Ergocalciferol, (DRISDOL) 50000 UNITS CAPS capsule Take 50,000 Units by mouth every 30 (thirty) days.   01/16/14   Scheduled:  .  aspirin EC  325 mg Oral Daily  . atorvastatin  10 mg Oral Daily  . cefTRIAXone (ROCEPHIN)  IV  1 g Intravenous Q24H  . cloNIDine  0.3 mg Transdermal Q Fri  . divalproex  375 mg Oral BID  . DULoxetine  30 mg Oral Daily  . hydrALAZINE  50 mg Oral Q6H  . Influenza vac split quadrivalent PF  0.5 mL Intramuscular Tomorrow-1000  . levETIRAcetam  250 mg Oral Q12H  . metoprolol tartrate  50 mg Oral Q6H  . pantoprazole  80 mg Oral Daily  . pneumococcal 23 valent vaccine  0.5 mL Intramuscular Tomorrow-1000  . polyethylene glycol  17 g Oral Daily  . polyvinyl alcohol  1 drop Both Eyes BID  . senna-docusate  1 tablet Oral Daily  . sodium chloride  3 mL Intravenous Q12H  . [START ON 02/16/2014] Vitamin D (Ergocalciferol)  50,000 Units Oral Q30 days    Assessment: 59 yo male here with CP (he is noted with history of  PCI with stent) to begin heparin for NSTEMI (max troponin= 16.09). Due to QOL issues, plan is for 48 hours of heparin and then medical management. Last 2 HL have been at upper end of goal range, HL this am is SUBtherapeutic at 0.20. Per nurse, no interruptions or issues with gtt. H/H and plt have trended down slightly but no reported s/s of significant bleeding.   Goal of Therapy:  Heparin level 0.3-0.7 units/ml Monitor platelets by anticoagulation protocol: Yes   Plan:  - Increase heparin gtt to 950 u/hr  - Monitor 6 hour HL and daily HL/CBC thereafter - F/u discontinuation after 48 hours (1/24)  Cicero Duck K. Bonnye Fava, PharmD Clinical Pharmacist - Resident Pager: [phone] Pharmacy: [phone] 02/02/2014 6:30 AM   ADDENDUM: - HL remains SUBtherapeutic (0.24) shortly after gtt was discontinued for down trending platelets - D/c heparin gtt and continue to monitor platelet trend and for any s/s bleeding  Gene Glazebrook K. Bonnye Fava, PharmD Clinical Pharmacist - Resident Pager: [phone] Pharmacy: [phone] 02/02/2014 11:03 AM

## 2014-02-03 DIAGNOSIS — I471 Supraventricular tachycardia: Secondary | ICD-10-CM

## 2014-02-03 DIAGNOSIS — R400 Somnolence: Secondary | ICD-10-CM | POA: Diagnosis present

## 2014-02-03 DIAGNOSIS — R9431 Abnormal electrocardiogram [ECG] [EKG]: Secondary | ICD-10-CM

## 2014-02-03 DIAGNOSIS — I214 Non-ST elevation (NSTEMI) myocardial infarction: Secondary | ICD-10-CM | POA: Diagnosis not present

## 2014-02-03 LAB — COMPREHENSIVE METABOLIC PANEL
ALK PHOS: 45 U/L (ref 39–117)
ALT: 33 U/L (ref 0–53)
ANION GAP: 8 (ref 5–15)
AST: 52 U/L — ABNORMAL HIGH (ref 0–37)
Albumin: 2.3 g/dL — ABNORMAL LOW (ref 3.5–5.2)
BILIRUBIN TOTAL: 0.7 mg/dL (ref 0.3–1.2)
BUN: 13 mg/dL (ref 6–23)
CO2: 27 mmol/L (ref 19–32)
Calcium: 8.2 mg/dL — ABNORMAL LOW (ref 8.4–10.5)
Chloride: 103 mmol/L (ref 96–112)
Creatinine, Ser: 1.22 mg/dL (ref 0.50–1.35)
GFR calc Af Amer: 74 mL/min — ABNORMAL LOW (ref >90)
GFR calc non Af Amer: 64 mL/min — ABNORMAL LOW (ref >90)
Glucose, Bld: 98 mg/dL (ref 70–99)
Potassium: 3.3 mmol/L — ABNORMAL LOW (ref 3.5–5.1)
Sodium: 138 mmol/L (ref 135–145)
TOTAL PROTEIN: 5.9 g/dL — AB (ref 6.0–8.3)

## 2014-02-03 LAB — CBC
HCT: 30.8 % — ABNORMAL LOW (ref 39.0–52.0)
Hemoglobin: 10 g/dL — ABNORMAL LOW (ref 13.0–17.0)
MCH: 25.5 pg — ABNORMAL LOW (ref 26.0–34.0)
MCHC: 32.5 g/dL (ref 30.0–36.0)
MCV: 78.6 fL (ref 78.0–100.0)
PLATELETS: 136 10*3/uL — AB (ref 150–400)
RBC: 3.92 MIL/uL — ABNORMAL LOW (ref 4.22–5.81)
RDW: 16.2 % — ABNORMAL HIGH (ref 11.5–15.5)
WBC: 9 10*3/uL (ref 4.0–10.5)

## 2014-02-03 LAB — GLUCOSE, CAPILLARY
Glucose-Capillary: 76 mg/dL (ref 70–99)
Glucose-Capillary: 84 mg/dL (ref 70–99)
Glucose-Capillary: 139 mg/dL — ABNORMAL HIGH (ref 70–99)
Glucose-Capillary: 131 mg/dL — ABNORMAL HIGH (ref 70–99)

## 2014-02-03 LAB — MAGNESIUM: Magnesium: 1.7 mg/dL (ref 1.5–2.5)

## 2014-02-03 MED ORDER — DIVALPROEX SODIUM 125 MG PO CPSP
375.0000 mg | ORAL_CAPSULE | Freq: Every day | ORAL | Status: DC
  Administered 2014-02-03 – 2014-02-04 (×2): 375 mg via ORAL
  Filled 2014-02-03 (×3): qty 3

## 2014-02-03 MED ORDER — LEVETIRACETAM 100 MG/ML PO SOLN
250.0000 mg | Freq: Two times a day (BID) | ORAL | Status: DC
  Administered 2014-02-03 – 2014-02-04 (×2): 250 mg via ORAL
  Filled 2014-02-03 (×6): qty 2.5

## 2014-02-03 MED ORDER — ASPIRIN 81 MG PO CHEW: 324.0000 mg | CHEWABLE_TABLET | Freq: Every day | ORAL | Status: DC

## 2014-02-03 MED ORDER — POTASSIUM CHLORIDE 20 MEQ/15ML (10%) PO SOLN
40.0000 meq | ORAL | Status: AC
  Administered 2014-02-03: 40 meq via ORAL
  Filled 2014-02-03 (×2): qty 30

## 2014-02-03 MED ORDER — PANTOPRAZOLE SODIUM 40 MG PO PACK
80.0000 mg | PACK | Freq: Every day | ORAL | Status: DC
  Filled 2014-02-03 (×3): qty 40

## 2014-02-03 MED ORDER — ASPIRIN 325 MG PO TABS
325.0000 mg | ORAL_TABLET | Freq: Every day | ORAL | Status: DC
  Administered 2014-02-04: 325 mg via ORAL
  Filled 2014-02-03 (×3): qty 1

## 2014-02-03 MED ORDER — CEFUROXIME AXETIL 500 MG PO TABS
500.0000 mg | ORAL_TABLET | Freq: Two times a day (BID) | ORAL | Status: DC
  Administered 2014-02-03 – 2014-02-04 (×2): 500 mg via ORAL
  Filled 2014-02-03 (×6): qty 1

## 2014-02-03 MED ORDER — POTASSIUM CHLORIDE CRYS ER 20 MEQ PO TBCR: 40.0000 meq | EXTENDED_RELEASE_TABLET | ORAL | Status: DC

## 2014-02-03 MED ORDER — ENSURE COMPLETE PO LIQD
237.0000 mL | Freq: Two times a day (BID) | ORAL | Status: DC
  Administered 2014-02-03 – 2014-02-04 (×2): 237 mL via ORAL

## 2014-02-03 MED ORDER — METOPROLOL TARTRATE 12.5 MG HALF TABLET
12.5000 mg | ORAL_TABLET | Freq: Two times a day (BID) | ORAL | Status: DC
  Administered 2014-02-03 – 2014-02-04 (×2): 12.5 mg via ORAL
  Filled 2014-02-03 (×3): qty 1

## 2014-02-03 NOTE — Progress Notes (Signed)
PROGRESS NOTE  Kenyan Karnes Garden ZOX:[id number] DOB: 06-10-1955 DOA: 01/31/2014 PCP: Terald Sleeper, MD   Overnight events: Antihypertensives held for borderline blood pressures in the 90s-110s.  Assessment/Plan:  NSTEMI- appears comfortable.  Medical management.    UTI- culture negative. Change to ceftin x 2 more days  Lactic acidosis- resolved with IVF. Metformin stopped  Constipation- continue laxatives  HTN- blood pressure borderline and multiple meds held last night. Will d/c clonidine patch. Decrease metoprolol to 12.5. Hold hydralazine and isordil. Continue cozaar as BP tolerates.  Diabetes type 2:  CBGs ok. Continue SSI  Ischemic cardiomyopathy: No evidence of heart failure. Monitor weights and I/O. On ARB, BB. Saline lock IV  Acute renal failure: Creatinine improving.   PSVT: had brief run this am. Continue metoprolol  Hypokalemia: replete.  History of hemorrhagic CVA with aphasia, bed and chair bound status, total care. PT agrees SNF appropriate. Will consult SW.  Somnolence: stopping clonidine should help. Change depakote to qhs.  Code Status: full Family Communication: Sister and son at bedside 1/24. Disposition Plan: family would like SNF at d/c. They report that they've arty started the process with [facility].  Consultants:  cardiology  Procedures:     HPI/Subjective: No reported complaints  Objective: Filed Vitals:   02/03/14 0724  BP: 109/66  Pulse: 69  Temp: 97.7 F (36.5 C)  Resp: 20    Intake/Output Summary (Last 24 hours) at 02/03/14 0828 Last data filed at 02/03/14 0500  Gross per 24 hour  Intake    160 ml  Output    500 ml  Net   -340 ml   Filed Weights   02/01/14 0445 02/02/14 0524 02/03/14 0525  Weight: 72.122 kg (159 lb) 73.029 kg (161 lb) 73.5 kg (162 lb 0.6 oz)    Exam:   General:  Asleep. Briefly arousable. Appears comfortable. Does not speak or follow commands.  Cardiovascular: rrr without murmurs gallops  rubs  Respiratory: clear to auscultation without wheezes rhonchi or rales  Abdomen: +BS, soft, nontender nondistended  Extremities: No clubbing cyanosis or edema   Data Reviewed: Basic Metabolic Panel:  Recent Labs Lab 01/31/14 0310 02/01/14 0850 02/03/14 0441  NA 138 133* 138  K 4.9 3.8 3.3*  CL 100 98 103  CO2 GLUCOSE 182* 179* 98  BUN 26* 12 13  CREATININE 1.87* 1.22 1.22  CALCIUM 9.5 8.8 8.2*   Liver Function Tests:  Recent Labs Lab 01/31/14 0310 02/01/14 0850 02/03/14 0441  AST 36 170* 52*  ALT 28 43 33  ALKPHOS 69 64 45  BILITOT 0.7 0.6 0.7  PROT 7.7 7.3 5.9*  ALBUMIN 3.8 3.3* 2.3*    Recent Labs Lab 01/31/14 0310  LIPASE 49   No results for input(s): AMMONIA in the last 168 hours. CBC:  Recent Labs Lab 01/31/14 0410 02/01/14 0850 02/02/14 0349 02/03/14 0441  WBC 8.2 13.1* 14.3* 9.0  HGB 13.5 12.6* 11.2* 10.0*  HCT 40.1 37.2* 33.6* 30.8*  MCV 79.2 78.8 78.5 78.6  PLT 203 176 149* 136*   Cardiac Enzymes:  Recent Labs Lab 01/31/14 0945 01/31/14 1410 01/31/14 2021  TROPONINI 0.71* 3.14* 16.09*   BNP (last 3 results) No results for input(s): PROBNP in the last 8760 hours. CBG:  Recent Labs Lab 02/02/14 1202 02/02/14 1630 02/02/14 2103 02/03/14 0732  GLUCAP 128* 109* 112* 76    Recent Results (from the past 240 hour(s))  MRSA PCR Screening     Status: None   Collection  Time: 01/31/14  7:20 AM  Result Value Ref Range Status   MRSA by PCR NEGATIVE NEGATIVE Final    Comment:        The GeneXpert MRSA Assay (FDA approved for NASAL specimens only), is one component of a comprehensive MRSA colonization surveillance program. It is not intended to diagnose MRSA infection nor to guide or monitor treatment for MRSA infections.   Culture, Urine     Status: None   Collection Time: 02/01/14  1:33 PM  Result Value Ref Range Status   Specimen Description URINE, RANDOM  Final   Special Requests NONE  Final   Colony  Count NO GROWTH [facility]formed at [facility]   Final   Culture NO GROWTH Performed at [facility]   Final   Report Status 02/02/2014 FINAL  Final     Studies: No results found.  Scheduled Meds: . aspirin EC  325 mg Oral Daily  . atorvastatin  10 mg Oral Daily  . cefTRIAXone (ROCEPHIN)  IV  1 g Intravenous Q24H  . cloNIDine  0.3 mg Transdermal Q Fri  . divalproex  375 mg Oral BID  . DULoxetine  30 mg Oral Daily  . hydrALAZINE  50 mg Oral Q6H  . Influenza vac split quadrivalent PF  0.5 mL Intramuscular Tomorrow-1000  . insulin aspart  0-5 Units Subcutaneous QHS  . insulin aspart  0-9 Units Subcutaneous TID WC  . isosorbide dinitrate  20 mg Oral TID  . levETIRAcetam  250 mg Oral Q12H  . losartan  25 mg Oral Daily  . metoprolol tartrate  50 mg Oral Q6H  . pantoprazole  80 mg Oral Daily  . pneumococcal 23 valent vaccine  0.5 mL Intramuscular Tomorrow-1000  . polyethylene glycol  17 g Oral Daily  . polyvinyl alcohol  1 drop Both Eyes BID  . senna-docusate  1 tablet Oral Daily  . sodium chloride  3 mL Intravenous Q12H  . [START ON 02/16/2014] Vitamin D (Ergocalciferol)  50,000 Units Oral Q30 days   Continuous Infusions: . sodium chloride 10 mL (02/02/14 1217)   Antibiotics Given (last 72 hours)    Date/Time Action Medication Dose Rate   02/01/14 1026 Given   cefTRIAXone (ROCEPHIN) 1 g in dextrose 5 % 50 mL IVPB - Premix 1 g 100 mL/hr   02/02/14 0932 Given   cefTRIAXone (ROCEPHIN) 1 g in dextrose 5 % 50 mL IVPB - Premix 1 g 100 mL/hr      Time spent: 35 min  Taeveon Keesling L  [facility] [email or web address], password [email or web address] 02/03/2014, 8:28 AM  LOS: 3 days

## 2014-02-03 NOTE — Progress Notes (Signed)
Patient refused to take AM medications. Patient clenching jaws together and turning away from medications.  Medications documented as refused in EPIC.  Dr. Lendell CapriceSullivan notified.

## 2014-02-03 NOTE — Clinical Social Work Placement (Addendum)
    Clinical Social Work Department CLINICAL SOCIAL WORK PLACEMENT NOTE 02/03/2014  Patient:  [name],[name]  Account Number:  [id number] Admit date:  01/31/2014  Clinical Social Worker:  Sharol HarnessPOONUM Jude Linck, Theresia MajorsLCSWA  Date/time:  02/03/2014 01:16 PM  Clinical Social Work is seeking post-discharge placement for this patient at the following level of care:   SKILLED NURSING   (*CSW will update this form in Epic as items are completed)   02/03/2014  [facility]/family provided with [facility] Department of Clinical Social Work'[facility] list of facilities offering this level of care within the geographic area requested by the patient (or if unable, by the patient'Oneill family).  02/03/2014  Patient/family informed of their freedom to choose among providers that offer the needed level of care, that participate in Medicare, Medicaid or managed care program needed by the patient, have an available bed and are willing to accept the patient.  02/03/2014  Patient/family informed of [facility]' ownership interest in [facility], as well as of the fact that they are under no obligation to receive care at this facility.  PASARR submitted to EDS on existing PASARR number received on   FL2 transmitted to all facilities in geographic area requested by pt/family on  02/03/2014 FL2 transmitted to all facilities within larger geographic area on   Patient informed that his/her managed care company has contracts with or will negotiate with  certain facilities, including the following:     Patient/family informed of bed offers received:  02/04/2014 [facility] Physician recommends and patient chooses bed at    Patient to be transferred to  HOME WITH HOSPICE on   Patient to be transferred to facility by  Patient and family notified of transfer on  Name of family member notified:    The following physician request were entered in Epic: Physician Request  Please sign FL2.    Additional  CommentsSharol Harness:    Michelangelo Rindfleisch, LCSWA [phone]

## 2014-02-03 NOTE — Progress Notes (Signed)
Pts SBP running 90's-110's. Pt to receive 50mg  Metoprolol and 50mg  of Hydralazine PO. K. Schorr, NP notified and orders to hold these meds and Isordil due at 10pm if BP remains low. Meds held and will cont to monitor.

## 2014-02-03 NOTE — Progress Notes (Signed)
Called by nursing staff about patient refusing medications and having poor by mouth intake. Multiple family members at bedside. Discussed poor prognosis with the family. I believe DO NOT RESUSCITATE status is reasonable. Should he deteriorate, would transition to comfort measures. It appears that palliative care has work with the family in the past. Family is agreeable to meeting with them. Will consult. When asked about CODE STATUS and plans if he declines, they are noncommittal.  Crista Curborinna Gazella Anglin, MD [facility]

## 2014-02-03 NOTE — Clinical Social Work Psychosocial (Signed)
     Clinical Social Work Department BRIEF PSYCHOSOCIAL ASSESSMENT 02/03/2014  Patient:  [name],[name]     Account Number:  [id number]     Admit date:  01/31/2014  Clinical Social Worker:  Harless NakayamaAMBELAL,Hardin Hardenbrook, LCSWA  Date/Time:  02/03/2014 12:28 PM  Referred by:  Physician  Date Referred:  02/03/2014 Referred for  SNF Placement   Other Referral:   Interview type:  Family Other interview type:    PSYCHOSOCIAL DATA Living Status:  WITH ADULT CHILDREN Admitted from facility:   Level of care:   Primary support name:  [name] [phone] Primary support relationship to patient:  CHILD, ADULT Degree of support available:   Pt has good support system    CURRENT CONCERNS Current Concerns  Post-Acute Placement   Other Concerns:    SOCIAL WORK ASSESSMENT / PLAN CSW visited pt room and spoke with pt son [name] and sister at bedside. Seye informed CSW he is pt HCPOA. Pt was living at home with family prior to admission however they were working on having [facility]. [facility] but family chose to have pt discharged home. Pt family feel that they are unable to manage pt care at home any longer. CSW explained that CSW would now assist with placement and family does not need to coordinate with facility. Pt family thankful for CSW involvement. They are relieved that pt will be able to dc from hospital to facility. CSW did receive phone call from [facility] and they confirmed above information and will be able to accept pt at dc. CSW to send updated FL2 and clinicals.   Assessment/plan status:  Psychosocial Support/Ongoing Assessment of Needs Other assessment/ plan:   Information/referral to community resources:   SNF list not needed    PATIENTS/FAMILYS RESPONSE TO PLAN OF CARE: Pt family pleasant and coopeartive. They are thankful for support in assisting with long term placement.    [name], LCSWA  [phone]

## 2014-02-03 NOTE — Progress Notes (Addendum)
Subjective:  Pt admitted 1/21 with NSTEMI. Trop peaked at 16. Pt is aphasic and bed bound secondary to prior CVA. Non communicative. Full Code  Objective:  Temp:  [97.7 F (36.5 C)-99.8 F (37.7 C)] 97.7 F (36.5 C) (01/25 0724) Pulse Rate:  [68-125] 69 (01/25 0724) Resp:  [14-23] 20 (01/25 0724) BP: (99-160)/(61-136) 109/66 mmHg (01/25 0724) SpO2:  [94 %-98 %] 95 % (01/25 0724) Weight:  [162 lb 0.6 oz (73.5 kg)] 162 lb 0.6 oz (73.5 kg) (01/25 0525) Weight change: 1 lb 0.6 oz (0.471 kg)  Intake/Output from previous day: 01/24 0701 - 01/25 0700 In: 160 [P.O.:60; I.V.:100] Out: 500 [Urine:500]  Intake/Output from this shift:    Physical Exam: General appearance: alert and no distress Neck: no adenopathy, no carotid bruit, no JVD, supple, symmetrical, trachea midline and thyroid not enlarged, symmetric, no tenderness/mass/nodules Lungs: clear to auscultation bilaterally Heart: regular rate and rhythm, S1, S2 normal, no murmur, click, rub or gallop Extremities: extremities normal, atraumatic, no cyanosis or edema  Lab Results: Results for orders placed or performed during the hospital encounter of 01/31/14 (from the past 48 hour(s))  Culture, Urine     Status: None   Collection Time: 02/01/14  1:33 PM  Result Value Ref Range   Specimen Description URINE, RANDOM    Special Requests NONE    Colony Count NO GROWTH Performed at [facility]     Culture NO GROWTH Performed at [facility]     Report Status 02/02/2014 FINAL   Heparin level (unfractionated)     Status: None   Collection Time: 02/01/14  5:07 PM  Result Value Ref Range   Heparin Unfractionated 0.64 0.30 - 0.70 IU/mL    Comment:        IF HEPARIN RESULTS ARE BELOW EXPECTED VALUES, AND PATIENT DOSAGE HAS BEEN CONFIRMED, SUGGEST FOLLOW UP TESTING OF ANTITHROMBIN III LEVELS.   CBC     Status: Abnormal   Collection Time: 02/02/14  3:49 AM  Result Value Ref Range   WBC 14.3 (H) 4.0 -  10.5 K/uL   RBC 4.28 4.22 - 5.81 MIL/uL   Hemoglobin 11.2 (L) 13.0 - 17.0 g/dL   HCT 33.6 (L) 39.0 - 52.0 %   MCV 78.5 78.0 - 100.0 fL   MCH 26.2 26.0 - 34.0 pg   MCHC 33.3 30.0 - 36.0 g/dL   RDW 16.1 (H) 11.5 - 15.5 %   Platelets 149 (L) 150 - 400 K/uL  Heparin level (unfractionated)     Status: Abnormal   Collection Time: 02/02/14  3:49 AM  Result Value Ref Range   Heparin Unfractionated 0.20 (L) 0.30 - 0.70 IU/mL    Comment:        IF HEPARIN RESULTS ARE BELOW EXPECTED VALUES, AND PATIENT DOSAGE HAS BEEN CONFIRMED, SUGGEST FOLLOW UP TESTING OF ANTITHROMBIN III LEVELS.   Heparin level (unfractionated)     Status: Abnormal   Collection Time: 02/02/14 10:14 AM  Result Value Ref Range   Heparin Unfractionated 0.24 (L) 0.30 - 0.70 IU/mL    Comment:        IF HEPARIN RESULTS ARE BELOW EXPECTED VALUES, AND PATIENT DOSAGE HAS BEEN CONFIRMED, SUGGEST FOLLOW UP TESTING OF ANTITHROMBIN III LEVELS.   Glucose, capillary     Status: Abnormal   Collection Time: 02/02/14 12:02 PM  Result Value Ref Range   Glucose-Capillary 128 (H) 70 - 99 mg/dL  Glucose, capillary     Status: Abnormal  Collection Time: 02/02/14  4:30 PM  Result Value Ref Range   Glucose-Capillary 109 (H) 70 - 99 mg/dL  Glucose, capillary     Status: Abnormal   Collection Time: 02/02/14  9:03 PM  Result Value Ref Range   Glucose-Capillary 112 (H) 70 - 99 mg/dL  CBC     Status: Abnormal   Collection Time: 02/03/14  4:41 AM  Result Value Ref Range   WBC 9.0 4.0 - 10.5 K/uL   RBC 3.92 (L) 4.22 - 5.81 MIL/uL   Hemoglobin 10.0 (L) 13.0 - 17.0 g/dL   HCT 30.8 (L) 39.0 - 52.0 %   MCV 78.6 78.0 - 100.0 fL   MCH 25.5 (L) 26.0 - 34.0 pg   MCHC 32.5 30.0 - 36.0 g/dL   RDW 16.2 (H) 11.5 - 15.5 %   Platelets 136 (L) 150 - 400 K/uL  Comprehensive metabolic panel     Status: Abnormal   Collection Time: 02/03/14  4:41 AM  Result Value Ref Range   Sodium 138 135 - 145 mmol/L   Potassium 3.3 (L) 3.5 - 5.1 mmol/L    Chloride 103 96 - 112 mmol/L   CO2 27 19 - 32 mmol/L   Glucose, Bld 98 70 - 99 mg/dL   BUN 13 6 - 23 mg/dL   Creatinine, Ser 1.22 0.50 - 1.35 mg/dL   Calcium 8.2 (L) 8.4 - 10.5 mg/dL   Total Protein 5.9 (L) 6.0 - 8.3 g/dL   Albumin 2.3 (L) 3.5 - 5.2 g/dL   AST 52 (H) 0 - 37 U/L   ALT 33 0 - 53 U/L   Alkaline Phosphatase 45 39 - 117 U/L   Total Bilirubin 0.7 0.3 - 1.2 mg/dL   GFR calc non Af Amer 64 (L) >90 mL/min   GFR calc Af Amer 74 (L) >90 mL/min    Comment: (NOTE) The eGFR has been calculated using the CKD EPI equation. This calculation has not been validated in all clinical situations. eGFR's persistently <90 mL/min signify possible Chronic Kidney Disease.    Anion gap 8 5 - 15  Glucose, capillary     Status: None   Collection Time: 02/03/14  7:32 AM  Result Value Ref Range   Glucose-Capillary 76 70 - 99 mg/dL    Imaging: Imaging results have been reviewed  Tele- NSR  Assessment/Plan:   1. Principal Problem: 2.   NSTEMI (non-ST elevated myocardial infarction) 3. Active Problems: 4.   Late effects of CVA (cerebrovascular accident) 5.   ARF (acute renal failure) 6.   Hypokalemia 7.   Diabetes mellitus with neurological manifestations, controlled 8.   Chest pain at rest 9.   Cardiomyopathy, ischemic 10.   Aphasia 11.   UTI (urinary tract infection) 12.   SVT (supraventricular tachycardia) 13.   Essential hypertension 14.   Somnolence 15.   Time Spent Directly with Patient:  10 minutes  Length of Stay:  LOS: 3 days   Pt had NSTEMI with diffuse ST depression c/w subendocardial ischemia. His 2D shows decreased EF (compared to prior 2D 2014 with [facility]). Agree pt is not a cath candidate.Unable to assess symptoms sine pt is aphasic and poorly communicative. On BB and ARB. No evidence of CHF. Would benefit from PT/SNF. He probably needs Code status readdressed. Currently on ASA as only antiplatelet med. I'm hesitant to add anything else at this time.  Nothing  further to add.   Lorretta Harp 02/03/2014, 9:08 AM

## 2014-02-03 NOTE — Progress Notes (Signed)
OT Cancellation Note  Patient Details Name: [name] MRN: [id number] DOB: 05/25/1955   Cancelled Treatment:    Reason Eval/Treat Not Completed: OT screened.  Pt is Medicare and current D/C plan is SNF. No apparent immediate acute care OT needs, therefore will defer OT to SNF. If OT eval is needed please call Acute Rehab Dept. at [phone] or text page OT at [phone].    Earlie RavelingStraub, Ruble Buttler L OTR/L [phone] 02/03/2014, 8:55 AM

## 2014-02-03 NOTE — Progress Notes (Signed)
11 beats SVT, patient asymptomatic, on call NP notified.

## 2014-02-03 NOTE — Progress Notes (Addendum)
INITIAL NUTRITION ASSESSMENT  DOCUMENTATION CODES Per approved criteria  -Not Applicable   INTERVENTION:  Ensure Complete PO BID, each supplement provides 350 kcal and 13 grams of protein  NUTRITION DIAGNOSIS: Inadequate oral intake related to altered mental status as evidenced by refusal to eat.   Goal: Intake to meet >90% of estimated nutrition needs.  Monitor:  PO intake, labs, weight trend, overall goals of care  Reason for Assessment: Low Braden   59 y.o. 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 Brought to the ED by his wife and son because he was grunting and clutching his chest.   Palliative Care consult has been placed. Patient is refusing to eat and take medications. At nutrition risk given poor oral intake. No weight loss PTA.  Height: Ht Readings from Last 1 Encounters:  01/31/14  (1.702 m)    Weight: Wt Readings from Last 1 Encounters:  02/03/14 162 lb 0.6 oz (73.5 kg)    Ideal Body Weight: 67.3 kg  % Ideal Body Weight: 109%  Wt Readings from Last 10 Encounters:  02/03/14 162 lb 0.6 oz (73.5 kg)  10/03/13 163 lb (73.936 kg)  07/16/13 169 lb (76.658 kg)  12/14/12 144 lb (65.318 kg)  09/24/12 151 lb (68.493 kg)  07/17/12 144 lb 3.2 oz (65.409 kg)    Usual Body Weight: 163 lbs  % Usual Body Weight: 99%  BMI:  Body mass index is 25.37 kg/(m^2).  Estimated Nutritional Needs: Kcal: 1800-2000 Protein: 85-100 gm Fluid: 2 L  Skin: intact  Diet Order: Diet heart healthy/carb modified  EDUCATION NEEDS: -Education not appropriate at this time   Intake/Output Summary (Last 24 hours) at 02/03/14 1454 Last data filed at 02/03/14 0500  Gross per 24 hour  Intake    160 ml  Output    500 ml  Net   -340 ml    Last BM: 1/22   Labs:   Recent Labs Lab 01/31/14 0310 02/01/14 0850 02/03/14 0441  NA 138 133* 138  K 4.9 3.8 3.3*  CL 100 98 103  CO2 BUN 26* 12 13  CREATININE 1.87* 1.22 1.22  CALCIUM 9.5 8.8 8.2*  GLUCOSE 182* 179* 98    CBG (last 3)   Recent Labs  02/02/14 2103 02/03/14 0732 02/03/14 1144  GLUCAP 112* 76 84    Scheduled Meds: . aspirin  325 mg Oral Daily  . atorvastatin  10 mg Oral Daily  . cefUROXime  500 mg Oral BID WC  . divalproex  375 mg Oral QHS  . DULoxetine  30 mg Oral Daily  . Influenza vac split quadrivalent PF  0.5 mL Intramuscular Tomorrow-1000  . insulin aspart  0-5 Units Subcutaneous QHS  . insulin aspart  0-9 Units Subcutaneous TID WC  . levETIRAcetam  250 mg Oral BID  . losartan  25 mg Oral Daily  . metoprolol tartrate  12.5 mg Oral BID  . pantoprazole sodium  80 mg Oral Daily  . pneumococcal 23 valent vaccine  0.5 mL Intramuscular Tomorrow-1000  . polyethylene glycol  17 g Oral Daily  . polyvinyl alcohol  1 drop Both Eyes BID  . senna-docusate  1 tablet Oral Daily  . sodium chloride  3 mL Intravenous Q12H  . [START ON 02/16/2014] Vitamin D (Ergocalciferol)  50,000 Units Oral Q30 days    Continuous Infusions:   Past Medical History  Diagnosis Date  . Stroke  2007  . Hypertension   . Difficult airway for intubation 06/22/2012    Deep and anterior. Difficult with standard laryngoscope. Easily intubated with Glidescope   . Coronary artery disease     Stent placement  . Malignant hypertension 07/14/2012  . 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 MSSA (methicillin susceptible Staphylococcus aureus) pneumonia 07/03/2012  . CVA (cerebral infarction) 06/22/2012  . Seizure, possible 06/22/2012  . Anxiety state, unspecified   . Zygomatic hyperplasia   . Abnormality of gait 10/03/2013    Past Surgical History  Procedure Laterality Date  . Coronary stent placement    . Cardiac surgery      Joaquin CourtsKimberly Alexey Rhoads, RD, LDN, CNSC Pager [phone] After Hours Pager [phone]

## 2014-02-04 DIAGNOSIS — R451 Restlessness and agitation: Secondary | ICD-10-CM

## 2014-02-04 DIAGNOSIS — R630 Anorexia: Secondary | ICD-10-CM

## 2014-02-04 DIAGNOSIS — Z515 Encounter for palliative care: Secondary | ICD-10-CM

## 2014-02-04 DIAGNOSIS — R400 Somnolence: Secondary | ICD-10-CM

## 2014-02-04 LAB — GLUCOSE, CAPILLARY
GLUCOSE-CAPILLARY: 110 mg/dL — AB (ref 70–99)
GLUCOSE-CAPILLARY: 124 mg/dL — AB (ref 70–99)
Glucose-Capillary: 101 mg/dL — ABNORMAL HIGH (ref 70–99)
Glucose-Capillary: 98 mg/dL (ref 70–99)

## 2014-02-04 LAB — TROPONIN I: TROPONIN I: 3.14 ng/mL — AB (ref <0.031)

## 2014-02-04 MED ORDER — MORPHINE SULFATE (CONCENTRATE) 10 MG/0.5ML PO SOLN
5.0000 mg | ORAL | Status: DC | PRN
  Administered 2014-02-05: 5 mg via ORAL
  Filled 2014-02-04 (×4): qty 0.5

## 2014-02-04 MED ORDER — MORPHINE SULFATE (CONCENTRATE) 10 MG/0.5ML PO SOLN
5.0000 mg | ORAL | Status: DC
  Administered 2014-02-04 – 2014-02-05 (×7): 5 mg via ORAL
  Filled 2014-02-04 (×6): qty 0.5

## 2014-02-04 MED ORDER — MORPHINE SULFATE 2 MG/ML IJ SOLN
2.0000 mg | Freq: Four times a day (QID) | INTRAMUSCULAR | Status: DC | PRN
  Administered 2014-02-04 – 2014-02-05 (×2): 2 mg via INTRAVENOUS
  Filled 2014-02-04 (×2): qty 1

## 2014-02-04 NOTE — Progress Notes (Signed)
[facility] is medically [facility] for [facility]. MD aware.  [name], LCSWA [phone]

## 2014-02-04 NOTE — Progress Notes (Signed)
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 He has these crying episodes and we discussed how difficult it has been for family to watch him in this amount of misery and that unfortunately this is the only way he expresses himself and it is difficult to tell if he is in pain, fearful, anxious. After discussing poor prognosis and that he is not a candidate for aggressive interventions in his weakened state family has decided that we should focus on comfort. We agree to initiating pain management with scheduled roxanol and with break through doses available (may increase dosage/frequency as needed) in preparation for home with hospice. DNR confirmed. Family wants to take him home because they want to respect him and maintain his dignity at this point in his life but they will need much support and help from hospice in this process. Prognosis is very poor with < 6 months and likely less with decreased intake and inconsistently cooperating with taking his medications. Quality of life has been extremely poor since his stroke. I will continue to follow.  Vinie Sill, NP Palliative Medicine Team Pager # [phone] (M-F 8a-5p) Team Phone # [phone] (Nights/Weekends)

## 2014-02-04 NOTE — Clinical Documentation Improvement (Signed)
  Per chart patient is "bed and chair confined" s/p hemorrhagic CVA with global aphasia. Requires assist with ADLs, plan SNF at discharge. Please clarify if this patient qualifies as:  Possible Conditions -- Functional quadriplegia  -- Other condition  Thank you,  Beverley FiedlerLaurie E Amin Fornwalt ,RN Clinical Documentation Specialist:  [phone]  [facility]- Health Information Management

## 2014-02-04 NOTE — Progress Notes (Signed)
PROGRESS NOTE  [name] ZOX:[id number] DOB: 1955/08/12 DOA: 01/31/2014 PCP: Terald SleeperOBSON,MICHAEL GAVIN, MD    Assessment/Plan:  NSTEMI- appears comfortable.  Medical management.    UTI- culture negative. Change to ceftin x 2 more days  Lactic acidosis- resolved with IVF. Metformin stopped  Constipation- continue laxatives  HTN- continue metoprolol to 12.5. Continue cozaar as BP tolerates.  Diabetes type 2:  CBGs ok. Continue SSI  Ischemic cardiomyopathy: No evidence of heart failure. Monitor weights and I/O. On ARB, BB. Saline lock IV  Acute renal failure: Creatinine improving.   NSVT: had brief run this am. Continue metoprolol  Hypokalemia: replete.  History of hemorrhagic CVA with aphasia, bed and chair bound status, total care.   Somnolence: seems more alert off clonidine and Changing depakote to qhs.  Overall, prognosis poor.  Discussed with Ms. Jimmey Ralpharker, PMT and we both discussed with family.  They are leaning toward home with hospice.  I await final decision, as ongoing palliative discussion occuring.  Code Status: full ?transitioning to comfort care Family Communication: wife, son 2 brothers at bedside Disposition Plan: awaiting PMT discussion.  Consultants:  Cardiology  PMT  Procedures:     HPI/Subjective: No reported complaints. Refused meds yesterday am. Took them last night.  Objective: Filed Vitals:   02/04/14 1126  BP: 143/91  Pulse:   Temp: 98.2 F (36.8 C)  Resp: 18    Intake/Output Summary (Last 24 hours) at 02/04/14 1408 Last data filed at 02/04/14 0554  Gross per 24 hour  Intake      0 ml  Output    800 ml  Net   -800 ml   Filed Weights   02/02/14 0524 02/03/14 0525 02/04/14 1000  Weight: 73.029 kg (161 lb) 73.5 kg (162 lb 0.6 oz) 72.6 kg (160 lb 0.9 oz)    Exam:   General:  Asleep. Briefly arousable. Appears comfortable. Does not speak or follow commands.  Cardiovascular: rrr without murmurs gallops rubs  Respiratory: clear  to auscultation without wheezes rhonchi or rales  Abdomen: +BS, soft, nontender nondistended  Extremities: No clubbing cyanosis or edema   Data Reviewed: Basic Metabolic Panel:  Recent Labs Lab 01/31/14 0310 02/01/14 0850 02/03/14 0441 02/03/14 2159  NA 138 133* 138  --   K 4.9 3.8 3.3*  --   CL 100 98 103  --   CO2 23 22 27   --   GLUCOSE 182* 179* 98  --   BUN 26* 12 13  --   CREATININE 1.87* 1.22 1.22  --   CALCIUM 9.5 8.8 8.2*  --   MG  --   --   --  1.7   Liver Function Tests:  Recent Labs Lab 01/31/14 0310 02/01/14 0850 02/03/14 0441  AST 36 170* 52*  ALT 28 43 33  ALKPHOS 69 64 45  BILITOT 0.7 0.6 0.7  PROT 7.7 7.3 5.9*  ALBUMIN 3.8 3.3* 2.3*    Recent Labs Lab 01/31/14 0310  LIPASE 49   No results for input(s): AMMONIA in the last 168 hours. CBC:  Recent Labs Lab 01/31/14 0410 02/01/14 0850 02/02/14 0349 02/03/14 0441  WBC 8.2 13.1* 14.3* 9.0  HGB 13.5 12.6* 11.2* 10.0*  HCT 40.1 37.2* 33.6* 30.8*  MCV 79.2 78.8 78.5 78.6  PLT 203 176 149* 136*   Cardiac Enzymes:  Recent Labs Lab 01/31/14 0945 01/31/14 1410 01/31/14 2021  TROPONINI 0.71* 3.14* 16.09*   BNP (last 3 results) No results for input(s): PROBNP in the last  8760 hours. CBG:  Recent Labs Lab 02/03/14 1144 02/03/14 1637 02/03/14 2148 02/04/14 0749 02/04/14 1154  GLUCAP 84 139* 131* 110* 101*    Recent Results (from the past 240 hour(s))  MRSA PCR Screening     Status: None   Collection Time: 01/31/14  7:20 AM  Result Value Ref Range Status   MRSA by PCR NEGATIVE NEGATIVE Final    Comment:        The GeneXpert MRSA Assay (FDA approved for NASAL specimens only), is one component of a comprehensive MRSA colonization surveillance program. It is not intended to diagnose MRSA infection nor to guide or monitor treatment for MRSA infections.   Culture, Urine     Status: None   Collection Time: 02/01/14  1:33 PM  Result Value Ref Range Status   Specimen  Description URINE, RANDOM  Final   Special Requests NONE  Final   Colony Count NO GROWTH Performed at [facility]   Final   Culture NO GROWTH Performed at [facility]   Final   Report Status 02/02/2014 FINAL  Final     Studies: No results found.  Scheduled Meds: . aspirin  325 mg Oral Daily  . atorvastatin  10 mg Oral Daily  . cefUROXime  500 mg Oral BID WC  . divalproex  375 mg Oral QHS  . DULoxetine  30 mg Oral Daily  . feeding supplement (ENSURE COMPLETE)  237 mL Oral BID BM  . insulin aspart  0-5 Units Subcutaneous QHS  . insulin aspart  0-9 Units Subcutaneous TID WC  . levETIRAcetam  250 mg Oral BID  . losartan  25 mg Oral Daily  . metoprolol tartrate  12.5 mg Oral BID  . pantoprazole sodium  80 mg Oral Daily  . polyethylene glycol  17 g Oral Daily  . polyvinyl alcohol  1 drop Both Eyes BID  . senna-docusate  1 tablet Oral Daily  . sodium chloride  3 mL Intravenous Q12H  . [START ON 02/16/2014] Vitamin D (Ergocalciferol)  50,000 Units Oral Q30 days   Continuous Infusions:   Antibiotics Given (last 72 hours)    Date/Time Action Medication Dose Rate   02/02/14 0932 Given   cefTRIAXone (ROCEPHIN) 1 g in dextrose 5 % 50 mL IVPB - Premix 1 g 100 mL/hr   02/03/14 1623 Given   cefUROXime (CEFTIN) tablet 500 mg 500 mg       Time spent: 35 min  Leray Garverick L  [facility] [email or web address], password [facility] 02/04/2014, 2:08 PM  LOS: 4 days

## 2014-02-04 NOTE — Progress Notes (Signed)
CSW (Clinical Child psychotherapistocial Worker) notified plan will be for pt to dc home with hospice. CSW notified facility. CSW signing off.  Yashas Camilli, LCSWA [phone]

## 2014-02-04 NOTE — Consult Note (Signed)
Patient ID:[name]      DOB: 1955/12/02      VHQ:[id number]     Consult Note from the Palliative Medicine Team at [facility] Requested by: Dr. 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 He has these crying episodes and we discussed how difficult it has been for family to watch him in this amount of misery and that unfortunately this is the only way he expresses himself and it is difficult to tell if he is in pain, fearful, anxious. After discussing poor prognosis and that he is not a candidate for aggressive interventions in his weakened state family has decided that we should focus on comfort. We agree to initiating pain management with scheduled roxanol and with break through doses available (may increase dosage/frequency as needed) in preparation for home with hospice. DNR confirmed. Family wants to take him home because they want to respect him and maintain his dignity at this point in his life but they will need much support and help from hospice in this process. Prognosis is very poor with < 6 months and likely less with decreased intake and inconsistently cooperating with taking his medications. Quality of life has been extremely poor since his stroke. I will continue to follow.   Goals of Care: 1.  Code Status: DNR   2. Disposition: Home with hospice.    3. Symptom Management:   1. Anxiety/Agitation: Mainly r/t pain. Also recommend low dose lorazepam solution every 4 hours prn.  2. Pain: Roxanol 5 mg every 4 hours scheduled. Roxanol 5 mg every hour prn. His pain has responded well to morphine during his hospitalization.  3. Decreased appetite: Offer foods that he enjoys and likes. May offer feeding  supplement if he will take it.   4. Psychosocial: Emotional support provided to patient and to family during very difficult conversation and situation.    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 Poor options for intervention d/t poor functional status and quality of life since previous CVA with global aphasia. Family endorses his poor quality of life and understand that he is a poor candidate for aggressive interventions. PMH reviewed below.    ROS: Unable to assess - global aphasia.     PMH:  Past Medical History  Diagnosis Date  . Stroke 2007  . Hypertension   . Difficult airway for intubation 06/22/2012    Deep and anterior. Difficult with standard laryngoscope. Easily intubated with Glidescope   . Coronary artery disease     Stent placement  . Malignant hypertension 07/14/2012  . 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 MSSA (methicillin susceptible Staphylococcus aureus) pneumonia 07/03/2012  . CVA (cerebral infarction) 06/22/2012  . Seizure, possible 06/22/2012  . Anxiety state, unspecified   . Zygomatic hyperplasia   . Abnormality of gait 10/03/2013     PSH: Past Surgical History  Procedure Laterality Date  . Coronary stent placement    . Cardiac surgery     I have reviewed the [facility] and SH and  If appropriate update it with new information. No Known Allergies Scheduled Meds: . aspirin  325 mg Oral Daily  . atorvastatin  10 mg Oral Daily  . cefUROXime  500 mg Oral BID WC  . divalproex  375 mg Oral QHS  . DULoxetine  30 mg Oral Daily  . feeding supplement (ENSURE COMPLETE)  237 mL Oral BID BM  . insulin aspart  0-5 Units Subcutaneous QHS  . insulin aspart  0-9 Units Subcutaneous TID WC  . levETIRAcetam  250 mg Oral BID  . losartan  25 mg Oral Daily  . metoprolol tartrate  12.5 mg Oral BID  . pantoprazole sodium  80 mg Oral Daily  . polyethylene glycol  17 g Oral Daily  . polyvinyl alcohol  1  drop Both Eyes BID  . senna-docusate  1 tablet Oral Daily  . sodium chloride  3 mL Intravenous Q12H  . [START ON 02/16/2014] Vitamin D (Ergocalciferol)  50,000 Units Oral Q30 days   Continuous Infusions:  PRN Meds:.bisacodyl, clonazePAM, morphine injection, ondansetron **OR** ondansetron (ZOFRAN) IV    BP 143/91 mmHg  Pulse 94  Temp(Src) 98.2 F (36.8 C) (Oral)  Resp 18  Ht $R'5\' 7"'ch$  (1.702 m)  Wt 72.6 kg (160 lb 0.9 oz)  BMI 25.06 kg/m2  SpO2 100%   PPS: 20%   Intake/Output Summary (Last 24 hours) at 02/04/14 1339 Last data filed at 02/04/14 0554  Gross per 24 hour  Intake      0 ml  Output    800 ml  Net   -800 ml    Physical Exam:  General: NAD, lying in bed HEENT: Temporal muscle wasting, moist mucous membranes Chest: CTA throughout, no labored breathing, symmetric CVS: RRR, S1 S2 Abdomen: Soft, NT, ND, +BS Ext: MAE, no edema, warm to touch Neuro: Altering mental status, lethargic but episodes of crying/agitation where he is difficult to console  Labs: CBC    Component Value Date/Time   WBC 9.0 02/03/2014 0441   WBC 5.0 05/03/2013   RBC 3.92* 02/03/2014 0441   HGB 10.0* 02/03/2014 0441   HCT 30.8* 02/03/2014 0441   PLT 136* 02/03/2014 0441   MCV 78.6 02/03/2014 0441   MCH 25.5* 02/03/2014 0441   MCHC 32.5 02/03/2014 0441   RDW 16.2* 02/03/2014 0441   LYMPHSABS 1.8 09/24/2012 0930   MONOABS 0.7 09/24/2012 0930   EOSABS 0.3 09/24/2012 0930   BASOSABS 0.0 09/24/2012 0930    BMET    Component Value Date/Time   NA 138 02/03/2014 0441   NA 142 05/03/2013   K 3.3* 02/03/2014 0441   CL 103 02/03/2014 0441   CO2 27 02/03/2014 0441   GLUCOSE 98 02/03/2014 0441   BUN 13 02/03/2014 0441   BUN 21 05/03/2013   CREATININE 1.22 02/03/2014 0441   CREATININE 1.4* 05/03/2013   CALCIUM 8.2* 02/03/2014 0441   GFRNONAA 64* 02/03/2014 0441   GFRAA 74* 02/03/2014 0441    CMP     Component Value Date/Time   NA 138 02/03/2014 0441   NA 142 05/03/2013   K 3.3*  02/03/2014 0441   CL 103 02/03/2014 0441   CO2 27 02/03/2014 0441   GLUCOSE 98 02/03/2014 0441   BUN 13 02/03/2014 0441   BUN 21 05/03/2013   CREATININE 1.22 02/03/2014 0441   CREATININE 1.4* 05/03/2013   CALCIUM 8.2* 02/03/2014 0441   PROT 5.9* 02/03/2014 0441   ALBUMIN 2.3* 02/03/2014 0441   AST 52* 02/03/2014 0441   ALT 33 02/03/2014 0441   ALKPHOS 45 02/03/2014 0441   BILITOT 0.7 02/03/2014 0441   GFRNONAA 64* 02/03/2014 0441   GFRAA  74* 02/03/2014 0441   60min  Greater than 50%  of this time was spent counseling and coordinating care related to the above assessment and plan.  Vinie Sill, NP Palliative Medicine Team Pager # [phone] (M-F 8a-5p) Team Phone # [phone] (Nights/Weekends)

## 2014-02-04 NOTE — Care Management Note (Addendum)
    Page 1 of 1   02/05/2014     10:46:26 AM CARE MANAGEMENT NOTE 02/05/2014  Patient:  [name],[name]   Account Number:  [id number]  Date Initiated:  02/04/2014  Documentation initiated by:  GRAVES-BIGELOW,Sary Bogie  Subjective/Objective Assessment:   Pt admitted for cp increased troponin. Pt is now DNR and Palliative Care is following. Plan for home with Hospice Care. Pt is from home with wife.     Action/Plan:   CM did offer choice for Hospice and the plan is to use [facility]. Per NP plan to work on pain management. NO DME needs at this time. CM did make referral to [facility]-vm left with Valente DavidMargie Lester liaison for [facility].   Anticipated DC Date:  02/05/2014   Anticipated DC Plan:  HOME W HOSPICE CARE      DC Planning Services  CM consult      Choice offered [facility] / [facility]:          [facility]  [facility]  HH-10 DISEASE MANAGEMENT      HH agency  [facility]   Status of service:  Completed, signed off Medicare Important Message given?  YES (If response is "NO", the following Medicare IM given date fields will be blank) Date Medicare IM given:  02/04/2014 Medicare IM given by:  GRAVES-BIGELOW,Tylar Amborn Date Additional Medicare IM given:   Additional Medicare IM given by:    Discharge Disposition:  HOME W HOSPICE CARE  Per UR Regulation:  Reviewed for med. necessity/level of care/duration of stay  If discussed at Long Length of Stay Meetings, dates discussed:    Comments:  02-05-13 1042 Patience MuscaBrenda Graves-Bigelow,RN, BSN [phone] CM did speak with pt'Oneill wife in regards to DME needs and pt will not need DME. Pt has hospital bed and hoyer lift. Pt is not on 02 here. If needs 02 he will be able to go through [facility] to get DME. Wife states they will need ambulance transport home. CM did speak with CSW in regards to transportation. No further needs from CM at this time.

## 2014-02-05 LAB — GLUCOSE, CAPILLARY
GLUCOSE-CAPILLARY: 130 mg/dL — AB (ref 70–99)
Glucose-Capillary: 96 mg/dL (ref 70–99)

## 2014-02-05 MED ORDER — METOPROLOL TARTRATE 25 MG PO TABS: 12.5000 mg | ORAL_TABLET | Freq: Two times a day (BID) | ORAL | Status: DC

## 2014-02-05 MED ORDER — MORPHINE SULFATE (CONCENTRATE) 10 MG/0.5ML PO SOLN: 5.0000 mg | ORAL | Status: DC

## 2014-02-05 MED ORDER — SENNA-DOCUSATE SODIUM 8.6-50 MG PO TABS: 2.0000 | ORAL_TABLET | Freq: Every day | ORAL | Status: DC

## 2014-02-05 MED ORDER — LOSARTAN POTASSIUM 50 MG PO TABS: 25.0000 mg | ORAL_TABLET | Freq: Every day | ORAL | Status: DC

## 2014-02-05 NOTE — Progress Notes (Signed)
Physical Therapy Treatment Patient Details Name: [name] MRN: [id number] DOB: [birth year] Today's Date: 02/05/2014    History of Present Illness 59 y.o. male with history of stroke and gobal aphasia, admitted with NSTEMI, UTI, and lactic acidosis.    PT Comments    Pt admitted with above diagnosis. Pt currently with functional limitations due to balance, strength, endurance and cognitive deficits. Pt was able to sit EOB today with assist and tolerate this position for 10 minutes.  Still needs SNF for therapy.  Pt will benefit from skilled PT to increase their independence and safety with mobility to allow discharge to the venue listed below.    Follow Up Recommendations  SNF     Equipment Recommendations  None recommended by PT    Recommendations for Other Services       Precautions / Restrictions Precautions Precautions: Fall Restrictions Weight Bearing Restrictions: No    Mobility  Bed Mobility Overal bed mobility: Needs Assistance;+2 for physical assistance Bed Mobility: Supine to Sit;Rolling;Sit to Supine Rolling: Total assist;+2 for physical assistance   Supine to sit: Total assist;+2 for physical assistance Sit to supine: Total assist;+2 for physical assistance   General bed mobility comments: Pt unable to assist with bil hemibody flexed posturing.    Transfers                    Ambulation/Gait                 Stairs            Wheelchair Mobility    Modified Rankin (Stroke Patients Only)       Balance Overall balance assessment: Needs assistance;History of Falls Sitting-balance support: No upper extremity supported;Feet supported Sitting balance-Leahy Scale: Zero Sitting balance - Comments: Pt sat at EOB with trunk support for 10 minutes needing total to max assist with pt demonstrating some trunk tone at times.  Pt kept wanting to lie down and would reach for left side to lie down.   Postural control: Posterior lean;Left  lateral lean                          Cognition Arousal/Alertness: Awake/alert Behavior During Therapy: Flat affect Overall Cognitive Status: History of cognitive impairments - at baseline                      Exercises Other Exercises Other Exercises: Passive ankle pumps x 10 with prolonged stretch of soleus and gastrocnemius muscles. Pt with notable clonus that resolves after approximately 10 seconds. Pt moans with discomfort during passive stretch of Rt ankle, notably more restricted in motion than Left. Other Exercises: Passive knee extension with hold x 30 seconds each LE, passive external and internal rotation of hips x 1 minute each LE. Pt moans with discomfort during most RLE ROM. PROM into hip flexion.    General Comments General comments (skin integrity, edema, etc.): Pt without intelligible speech today.       Pertinent Vitals/Pain Pain Assessment: Faces Faces Pain Scale: Hurts little more Pain Location: stretching of heel cords/ movement Pain Descriptors / Indicators: Aching;Sore Pain Intervention(s): Limited activity within patient's tolerance;Monitored during session;Repositioned;Patient requesting pain meds-RN notified  VSS    Home Living                      Prior Function            PT Goals (  current goals can now be found in the care plan section) Progress towards PT goals: Progressing toward goals    Frequency  Min 2X/week    PT Plan Current plan remains appropriate    Co-evaluation             End of Session   Activity Tolerance: Other (comment) (Limited by cognition) Patient left: in bed;with call bell/phone within reach;with family/visitor present     Time: 6962-9528 PT Time Calculation (min) (ACUTE ONLY): 19 min  Charges:  $Therapeutic Activity: 8-22 mins                    G CodesBerline Lopes Feb 09, 2014, 1:55 PM [employer] [facility] [phone] (pager)

## 2014-02-05 NOTE — Progress Notes (Signed)
At 0100, CMT notified RN that pt had a run of SVT. Pt asymptomatic and resting comfortably at the time. RN will continue to monitor pt.

## 2014-02-05 NOTE — Progress Notes (Signed)
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 Educated nursing on use of roxanol and d/c IV morphine as this is what family will need at home. I would also recommend prn lorazepam elixir for at home for agitation. Please call for further palliative needs/questions/concerns.  Yong ChannelAlicia Westlynn Fifer, NP Palliative Medicine Team Pager # [phone] (M-F 8a-5p) Team Phone # [phone] (Nights/Weekends)

## 2014-02-05 NOTE — Discharge Summary (Signed)
Physician Discharge Summary  Marcia Brashya S Mousel ZOX:[id number] DOB: 1955/07/23 DOA: 01/31/2014  PCP: Terald SleeperOBSON,MICHAEL GAVIN, MD  Admit date: 01/31/2014 Discharge date: 02/05/2014  Time spent: greater than 30 minutes  Recommendations for Outpatient Follow-up:  1. Home with hospice  Discharge Diagnoses:  Principal Problem:   NSTEMI (non-ST elevated myocardial infarction) Active Problems:   Late effects of CVA (cerebrovascular accident)   ARF (acute renal failure)   Hypokalemia   Diabetes mellitus with neurological manifestations, controlled   Chest pain at rest   Cardiomyopathy, ischemic   Aphasia   UTI (urinary tract infection)   SVT (supraventricular tachycardia)   Essential hypertension   Somnolence   EKG abnormalities   Discharge Condition: stable  Filed Weights   02/02/14 0524 02/03/14 0525 02/04/14 1000  Weight: 73.029 kg (161 lb) 73.5 kg (162 lb 0.6 oz) 72.6 kg (160 lb 0.9 oz)    History of present illness:  59 y.o. 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 She said he has done this before, but this time, it was more persistent. He had no vomiting, but did have diaphoresis. There has been no coughs. Evalatuion in the ER showed EKG with lateral ST depression, clear CXR and abdominal films were unremarkable. He has elevated BUN and Cr, and his BS was 180. He also has been on anticonvulsive medications which family believes were to prevent seizure. He also has been on Metformin, though family said he never was diagnosed with DM. He was given pain meds, and started IV NTG. He appears more comfortable. His lipase and initial troponin were negative. No fever or leukocytosis. His lactic acid was elevated  Hospital Course:  Admitted to telemetry. Ruled in for MI with peak troponin 16  NSTEMI- aspirin continued, heparin gtt, nitroglycerin gtt, beta blocker.  Cardiology consulted. Not an interventional candidate. Medical management. echo showed new ischemic cardiomyopathy with EF 35%  UA consistent with UTI- culture negative. treated  Lactic acidosis noted on admission- resolved with IVF. Metformin stopped  HTN- blood pressure medications decreased as patient became hypotensive.  Diabetes type 2: CBGs remained controlled on SSI  Ischemic cardiomyopathy: No evidence of heart failure.  On ARB, BB.   Acute renal failure: creatinine 1.87 on admission. Hydrated on admission. Resolved    NSVT: Continue metoprolol. Hypokalemia corrected  Psvt: on metoprolol  Hypokalemia: corrected  History of hemorrhagic CVA with aphasia, bed and chair bound status, total care.   Prognosis poor, with poor quality of life.  Palliative care consulted.  Family wishes to take patient home with hospice.  Procedures:  none  Consultations:  Cardiology  Palliative medicine  Discharge Exam: Filed Vitals:   02/05/14 0400  BP:   Pulse:   Temp: 99.2 F (37.3 C)  Resp:     General: intermittently tearful. And agitated. Cardiovascular: RRR Respiratory: CTA Ext: left hand edematous  Discharge Instructions   Discharge Instructions    Bed rest    Complete by:  As directed      Discharge instructions    Complete by:  As directed   Crush medications          Current Discharge Medication List    START taking these medications   Details  Morphine Sulfate (MORPHINE CONCENTRATE) 10 MG/0.5ML SOLN concentrated solution Take 0.25 mLs (5 mg total) by mouth every 4 (four) hours. And 0.25 ml by mouth  hourly as needed for pain or shortness of breath Qty: 30 mL, Refills: 0      CONTINUE these medications which have CHANGED   Details  losartan (COZAAR) 50 MG tablet Take 0.5 tablets (25 mg total) by mouth daily.    metoprolol tartrate (LOPRESSOR) 25 MG tablet Take 0.5 tablets (12.5 mg total) by mouth 2 (two) times daily. Qty: 120 tablet, Refills: 3     sennosides-docusate sodium (SENOKOT-S) 8.6-50 MG tablet Take 2 tablets by mouth daily.      CONTINUE these medications which have NOT CHANGED   Details  acetaminophen (TYLENOL) 325 MG tablet Give 650 mg by tube every 6 (six) hours as needed (elevated temperature).    aspirin 81 MG chewable tablet Chew 81 mg by mouth daily.    divalproex (DEPAKOTE SPRINKLE) 125 MG capsule Take 375 mg by mouth 2 (two) times daily.     levETIRAcetam (KEPPRA) 500 MG tablet Take 250 mg by mouth every 12 (twelve) hours.     omeprazole (PRILOSEC) 20 MG capsule Take 20 mg by mouth daily.    polyvinyl alcohol (LIQUIFILM TEARS) 1.4 % ophthalmic solution Place 1 drop into both eyes 2 (two) times daily.      STOP taking these medications     chlorthalidone (HYGROTON) 25 MG tablet      clonazePAM (KLONOPIN) 0.5 MG tablet      cloNIDine (CATAPRES - DOSED IN MG/24 HR) 0.3 mg/24hr patch      DULoxetine (CYMBALTA) 30 MG capsule      hydrALAZINE (APRESOLINE) 50 MG tablet      isosorbide dinitrate (ISORDIL) 40 MG tablet      metFORMIN (GLUCOPHAGE) 500 MG tablet      polyethylene glycol (MIRALAX / GLYCOLAX) packet      Vitamin D, Ergocalciferol, (DRISDOL) 50000 UNITS CAPS capsule      atorvastatin (LIPITOR) 10 MG tablet        No Known Allergies    The results of significant diagnostics from this hospitalization (including imaging, microbiology, ancillary and laboratory) are listed below for reference.    Significant Diagnostic Studies: Dg Chest Port 1 View  01/31/2014   CLINICAL DATA:  Chest pain  EXAM: PORTABLE CHEST - 1 VIEW  COMPARISON:  09/24/2012  FINDINGS: Stable cardiomegaly and high upper mediastinal widening. The upper mediastinal widening could be from goiter or ectatic vasculature. Mild rightward deviation of the trachea and subglottic narrowing is similar to previous, favoring goiter. Aortic contours are negative. There is no edema, consolidation, effusion, or pneumothorax.  IMPRESSION:  1. Stable exam.  No acute cardiopulmonary disease. 2. Question goiter with tracheal deviation.   Electronically Signed   By: Tiburcio Pea M.D.   On: 01/31/2014 03:58   Dg Abd Portable 1v  01/31/2014   CLINICAL DATA:  Distressed appearance.  EXAM: PORTABLE ABDOMEN - 1 VIEW  COMPARISON:  07/17/2012  FINDINGS: The stomach is moderately gas distended, but not clearly obstructed. There is no evidence of small or large bowel obstruction. No concerning intra-abdominal mass effect or calcification. Lung bases are clear.  IMPRESSION: Negative.   Electronically Signed   By: Tiburcio Pea M.D.   On: 01/31/2014 03:59   Echo Left ventricle: Septal apical and inferoapical hypokinesis. The cavity size was mildly dilated. Wall thickness was normal. Systolic function was moderately reduced. The estimated ejection fraction was in the range of 35% to 40%. - Mitral valve: There was mild regurgitation. - Left atrium: The atrium was mildly dilated. - Atrial septum: No  defect or patent foramen ovale was identified.  ekg Sinus rhythm with Premature atrial complexes Rightward axis Septal infarct , age undetermined Marked ST abnormality, possible inferior subendocardial injury Marked ST abnormality, possible anterolateral subendocardial injury  Microbiology: Recent Results (from the past 240 hour(s))  MRSA PCR Screening     Status: None   Collection Time: 01/31/14  7:20 AM  Result Value Ref Range Status   MRSA by PCR NEGATIVE NEGATIVE Final    Comment:        The GeneXpert MRSA Assay (FDA approved for NASAL specimens only), is one component of a comprehensive MRSA colonization surveillance program. It is not intended to diagnose MRSA infection nor to guide or monitor treatment for MRSA infections.   Culture, Urine     Status: None   Collection Time: 02/01/14  1:33 PM  Result Value Ref Range Status   Specimen Description URINE, RANDOM  Final   Special Requests NONE  Final   Colony Count NO  GROWTH Performed at [facility]   Final   Culture NO GROWTH Performed at [facility]   Final   Report Status 02/02/2014 FINAL  Final     Labs: Basic Metabolic Panel:  Recent Labs Lab 01/31/14 0310 02/01/14 0850 02/03/14 0441 02/03/14 2159  NA 138 133* 138  --   K 4.9 3.8 3.3*  --   CL 100 98 103  --   CO2 --   GLUCOSE 182* 179* 98  --   BUN 26* 12 13  --   CREATININE 1.87* 1.22 1.22  --   CALCIUM 9.5 8.8 8.2*  --   MG  --   --   --  1.7   Liver Function Tests:  Recent Labs Lab 01/31/14 0310 02/01/14 0850 02/03/14 0441  AST 36 170* 52*  ALT 28 43 33  ALKPHOS 69 64 45  BILITOT 0.7 0.6 0.7  PROT 7.7 7.3 5.9*  ALBUMIN 3.8 3.3* 2.3*    Recent Labs Lab 01/31/14 0310  LIPASE 49   No results for input(s): AMMONIA in the last 168 hours. CBC:  Recent Labs Lab 01/31/14 0410 02/01/14 0850 02/02/14 0349 02/03/14 0441  WBC 8.2 13.1* 14.3* 9.0  HGB 13.5 12.6* 11.2* 10.0*  HCT 40.1 37.2* 33.6* 30.8*  MCV 79.2 78.8 78.5 78.6  PLT 203 176 149* 136*   Cardiac Enzymes:  Recent Labs Lab 01/31/14 0945 01/31/14 1410 01/31/14 2021  TROPONINI 0.71* 3.14* 16.09*   BNP: BNP (last 3 results) No results for input(s): PROBNP in the last 8760 hours. CBG:  Recent Labs Lab 02/04/14 0749 02/04/14 1154 02/04/14 1644 02/04/14 2118 02/05/14 0736  GLUCAP 110* 101* 124* 98 96       Signed:  Arionne Iams L  [facility] 02/05/2014, 11:37 AM

## 2014-02-05 NOTE — Progress Notes (Signed)
Notified by Antonio BaarsBrenda CMRN, patient and family request services of [facility] after discharge.  Patient information reviewed with [name], [facility] Medical Director hospice eligibility confirmed. Spoke with [name] at bedside with son [name] on speaker phone to initiate education related to hospice services, philosophy and team approach to care; Antonio voiced understanding of information provided. Antonio stated she is trying to arrange for FMLA to be able to be at thome with pt 24/7.  Per discussion with CMRN and Antonio Lendell CapriceSullivan notes plan is to d/c home today if all can be arranged; per discussion with [name] she is going to take the rest of the day off from work to arrange things at home and [facility] later in the afternoon. *Please send completed GOLD DNR Form home w/pt **Please NOTE pt's home address not correct in EPIC -pt to d/c to [address] [place] [id number]  Antonio will need to have discharge prescriptions prior to pt leaving so she can get medications filled Per PMT recommendations discharge meds to include:  Liquid Concentrated Morphine 10 mg / 0.5 ml give 5 mg 6 x/day scheduled and may give 5 mg q 1 hr PRN pain/SOB, *Note- Dispense Quantity = 30 ml   DME needs discussed pt currently has hospital bed, BSC, hoyer lift and [facility]; she voiced no DME needs at this time.  [facility]  Attending physician working with [facility] at discharge is Antonio Thurmond ButtsKpeglo Please notify [facility] when patient is ready to leave unit at d/c call [phone] (or [phone] if after 5 pm); [facility] information and contact numbers also given to [name] during visit.   Above information shared with Staff RN Sharia ReeveJosh and [facility] Steward DroneBrenda Please call with any questions or concerns   Valente DavidMargie Shahiem Bedwell, RN 02/05/2014, 10:33 AM [facility] RN Liaison [phone]

## 2014-02-11 ENCOUNTER — Telehealth: Payer: Self-pay | Admitting: Neurology

## 2014-02-11 ENCOUNTER — Ambulatory Visit: Payer: PRIVATE HEALTH INSURANCE | Admitting: Neurology

## 2014-02-11 NOTE — Telephone Encounter (Signed)
This patient did not show for a revisit appointment today. 

## 2014-02-13 ENCOUNTER — Encounter: Payer: Self-pay | Admitting: Neurology

## 2014-03-17 ENCOUNTER — Encounter (HOSPITAL_COMMUNITY): Payer: Self-pay | Admitting: *Deleted

## 2014-06-23 ENCOUNTER — Encounter: Payer: Self-pay | Admitting: Internal Medicine

## 2014-06-23 ENCOUNTER — Non-Acute Institutional Stay (SKILLED_NURSING_FACILITY): Payer: Medicare Other | Admitting: Internal Medicine

## 2014-06-23 DIAGNOSIS — I214 Non-ST elevation (NSTEMI) myocardial infarction: Secondary | ICD-10-CM | POA: Diagnosis not present

## 2014-06-23 DIAGNOSIS — I255 Ischemic cardiomyopathy: Secondary | ICD-10-CM

## 2014-06-23 DIAGNOSIS — R569 Unspecified convulsions: Secondary | ICD-10-CM | POA: Diagnosis not present

## 2014-06-23 DIAGNOSIS — I6990 Unspecified sequelae of unspecified cerebrovascular disease: Secondary | ICD-10-CM

## 2014-06-23 NOTE — Progress Notes (Signed)
MRN: [id number] Name: [name]  Sex: male Age: 59 y.o. DOB: Jun 09, 1955  PSC #: [name] Facility/Room:204 Level Of Care: SNF Provider: Merrilee Seashore D Emergency Contacts: Extended Emergency Contact Information Primary Emergency Contact: [name],[name] Address: [address]          [place] [address] [place] Home Phone: [phone] Mobile Phone: [phone] Relation: Spouse Secondary Emergency Contact: [name] [place] Home Phone: [phone] Relation: Son  Code Status: DNR  Allergies: Review of patient's allergies indicates no known allergies.  Chief Complaint  Patient presents with  . respite care Hand P    HPI: Patient is 59 y.o. male who is Hospice for extensive prior CVA with bleeds who is admitted for respite care.  Past Medical History  Diagnosis Date  . Stroke 2007  . Hypertension   . Difficult airway for intubation 06/22/2012    Deep and anterior. Difficult with standard laryngoscope. Easily intubated with Glidescope   . Coronary artery disease     Stent placement: 06/19/2000. 05/22/2001. 04/20/2004.  . Malignant hypertension 07/14/2012  . 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 MSSA (methicillin susceptible Staphylococcus aureus) pneumonia 07/03/2012  . CVA (cerebral infarction) 06/22/2012  . Seizure, possible 06/22/2012  . Anxiety state, unspecified   . Zygomatic hyperplasia   . Abnormality of gait 10/03/2013    Past Surgical History  Procedure Laterality Date  . Coronary stent placement    . Cardiac surgery        Medication List       This list is accurate as of: 06/23/14 11:59 PM.  Always use your most recent med list.               acetaminophen 325 MG tablet  Commonly known as:  TYLENOL  Give 650 mg by tube every 6 (six) hours as needed (elevated temperature).     aspirin 81 MG chewable tablet  Chew 81 mg by mouth daily.     divalproex 125 MG capsule  Commonly known as:  DEPAKOTE  SPRINKLE  Take 375 mg by mouth 2 (two) times daily.     levETIRAcetam 500 MG tablet  Commonly known as:  KEPPRA  Take 250 mg by mouth every 12 (twelve) hours.     losartan 50 MG tablet  Commonly known as:  COZAAR  Take 0.5 tablets (25 mg total) by mouth daily.     metoprolol tartrate 25 MG tablet  Commonly known as:  LOPRESSOR  Take 0.5 tablets (12.5 mg total) by mouth 2 (two) times daily.     morphine CONCENTRATE 10 MG/0.5ML Soln concentrated solution  Take 0.25 mLs (5 mg total) by mouth every 4 (four) hours. And 0.25 ml by mouth hourly as needed for pain or shortness of breath     omeprazole 20 MG capsule  Commonly known as:  PRILOSEC  Take 20 mg by mouth daily.     polyvinyl alcohol 1.4 % ophthalmic solution  Commonly known as:  LIQUIFILM TEARS  Place 1 drop into both eyes 2 (two) times daily.     sennosides-docusate sodium 8.6-50 MG tablet  Commonly known as:  SENOKOT-S  Take 2 tablets by mouth daily.        No orders of the defined types were placed in this encounter.    Immunization History  Administered Date(s) Administered  . Influenza,inj,Quad PF,36+ Mos 02/03/2014  . Pneumococcal Polysaccharide-23 02/03/2014    History  Substance Use Topics  . Smoking status: Never Smoker   .  Smokeless tobacco: Never Used  . Alcohol Use: No    Family history is noncontributory    Review of Systems  DATA OBTAINED: from nurse, medical record GENERAL:  no fevers, fatigue, appetite changes; no concerns per nursing SKIN: No itching, rash or wounds EYES: No eye pain, redness, discharge EARS: No earache, tinnitus, change in hearing NOSE: No congestion, drainage or bleeding  MOUTH/THROAT: No mouth or tooth pain, No sore throat RESPIRATORY: No cough, wheezing, SOB CARDIAC: No chest pain, palpitations, lower extremity edema  GI: No abdominal pain, No N/V/D or constipation, No heartburn or reflux  GU: No dysuria, frequency or urgency, or incontinence  MUSCULOSKELETAL: No  unrelieved bone/joint pain NEUROLOGIC: No new changes PSYCHIATRIC: No overt anxiety or sadness, No behavior issue.   Filed Vitals:   06/25/14 2151  BP: 146/78  Pulse: 76  Temp: 98.1 F (36.7 C)  Resp: 20    Physical Exam  GENERAL APPEARANCE: Non conversant,  No acute distress.  SKIN: No diaphoresis rash HEAD: Normocephalic, atraumatic  EYES: Conjunctiva/lids clear. Pupils round, reactive. EOMs intact.  EARS: External exam WNL, canals clear. Hearing grossly normal.  NOSE: No deformity or discharge.  MOUTH/THROAT: Lips w/o lesions  RESPIRATORY: Breathing is even, unlabored. Lung sounds are clear   CARDIOVASCULAR: Heart RRR no murmurs, rubs or gallops. No peripheral edema.   GASTROINTESTINAL: Abdomen is soft, non-tender, not distended w/ normal bowel sounds. GENITOURINARY: no c/o MUSCULOSKELETAL: BUE contractures NEUROLOGIC:  Pt didn't speak or move  PSYCHIATRIC: Mood and affect appropriate to situation, no behavioral issues  Patient Active Problem List   Diagnosis Date Noted  . Status epilepticus 06/24/2014  . DNI (do not intubate)   . Hypocalcemia   . Palliative care encounter 02/04/2014  . Agitation 02/04/2014  . Decreased appetite 02/04/2014  . Somnolence 02/03/2014  . EKG abnormalities   . SVT (supraventricular tachycardia) 02/02/2014  . Essential hypertension 02/02/2014  . NSTEMI (non-ST elevated myocardial infarction) 02/01/2014  . Cardiomyopathy, ischemic 02/01/2014  . Aphasia 02/01/2014  . UTI (urinary tract infection) 02/01/2014  . Chest pain at rest 01/31/2014  . Abnormality of gait 10/03/2013  . General weakness 07/16/2013  . Diabetes mellitus with neurological manifestations, controlled 12/24/2012  . Gastroparesis 12/14/2012  . Dyslipidemia 12/14/2012  . Depression 12/14/2012  . Microcytic anemia 09/24/2012  . Malignant hypertension 07/14/2012  . Fever 07/11/2012  . Aspiration pneumonia 07/11/2012  . Hypernatremia 07/11/2012  . MSSA (methicillin  susceptible Staphylococcus aureus) pneumonia 07/03/2012  . Difficult airway for intubation 06/22/2012  . Late effects of CVA (cerebrovascular accident) 06/22/2012  . 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 Acute respiratory failure - intubated for AMS 06/22/2012  . Cerebral edema with midline shift 06/22/2012  . ARF (acute renal failure) 06/22/2012  . 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 Seizure, possible 06/22/2012  . Hypokalemia 06/22/2012  . AMI (acute myocardial infarction) 06/22/2012    CBC    Component Value Date/Time   WBC 8.6 06/25/2014 0245   WBC 5.0 05/03/2013   RBC 5.27 06/25/2014 0245   HGB 13.4 06/25/2014 0245   HCT 41.4 06/25/2014 0245   PLT 87* 06/25/2014 0245   MCV 78.6 06/25/2014 0245   LYMPHSABS 1.8 09/24/2012 0930   MONOABS 0.7 09/24/2012 0930   EOSABS 0.3 09/24/2012 0930   BASOSABS 0.0 09/24/2012 0930    CMP     Component Value Date/Time   NA 141 06/25/2014 0245   NA 142 05/03/2013   K 6.0* 06/25/2014 0245   CL  108 06/25/2014 0245   CO2 21* 06/25/2014 0245   GLUCOSE 87 06/25/2014 0245   BUN 13 06/25/2014 0245   BUN 21 05/03/2013   CREATININE 1.10 06/25/2014 0245   CREATININE 1.4* 05/03/2013   CALCIUM 9.1 06/25/2014 0245   PROT 7.2 06/24/2014 1335   ALBUMIN 3.1* 06/24/2014 1335   AST 35 06/24/2014 1335   ALT 6* 06/24/2014 1335   ALKPHOS 45 06/24/2014 1335   BILITOT 1.2 06/24/2014 1335   GFRNONAA >60 06/25/2014 0245   GFRAA >60 06/25/2014 0245    Assessment and Plan  Late effects of CVA (cerebrovascular accident) S/p devastating strokes with and without bleeds, with aphasia and bed and chair bound  NSTEMI (non-ST elevated myocardial infarction) Prior in 01/2014 which along with co-morbidities led to Hospice  Cardiomyopathy, ischemic Co;morbidity leading to Hospice;cont home meds  Seizure, possible Continue on keppra and depakote as seizure prophylaxis.    Margit Hanks, MD

## 2014-06-24 ENCOUNTER — Inpatient Hospital Stay (HOSPITAL_COMMUNITY)
Admission: EM | Admit: 2014-06-24 | Discharge: 2014-06-26 | DRG: 101 | Disposition: A | Discharge status: Hospice | Attending: Pulmonary Disease | Admitting: Pulmonary Disease

## 2014-06-24 ENCOUNTER — Emergency Department (HOSPITAL_COMMUNITY)

## 2014-06-24 ENCOUNTER — Encounter (HOSPITAL_COMMUNITY): Payer: Self-pay | Admitting: *Deleted

## 2014-06-24 DIAGNOSIS — G40401 Other generalized epilepsy and epileptic syndromes, not intractable, with status epilepticus: Secondary | ICD-10-CM | POA: Diagnosis present

## 2014-06-24 DIAGNOSIS — E876 Hypokalemia: Secondary | ICD-10-CM | POA: Diagnosis not present

## 2014-06-24 DIAGNOSIS — Z9114 Patient's other noncompliance with medication regimen: Secondary | ICD-10-CM | POA: Diagnosis present

## 2014-06-24 DIAGNOSIS — I2510 Atherosclerotic heart disease of native coronary artery without angina pectoris: Secondary | ICD-10-CM | POA: Diagnosis present

## 2014-06-24 DIAGNOSIS — I252 Old myocardial infarction: Secondary | ICD-10-CM | POA: Diagnosis not present

## 2014-06-24 DIAGNOSIS — Z955 Presence of coronary angioplasty implant and graft: Secondary | ICD-10-CM

## 2014-06-24 DIAGNOSIS — I255 Ischemic cardiomyopathy: Secondary | ICD-10-CM | POA: Diagnosis present

## 2014-06-24 DIAGNOSIS — G8110 Spastic hemiplegia affecting unspecified side: Secondary | ICD-10-CM | POA: Diagnosis present

## 2014-06-24 DIAGNOSIS — F419 Anxiety disorder, unspecified: Secondary | ICD-10-CM | POA: Diagnosis present

## 2014-06-24 DIAGNOSIS — Z66 Do not resuscitate: Secondary | ICD-10-CM | POA: Diagnosis present

## 2014-06-24 DIAGNOSIS — Z515 Encounter for palliative care: Secondary | ICD-10-CM | POA: Diagnosis not present

## 2014-06-24 DIAGNOSIS — F411 Generalized anxiety disorder: Secondary | ICD-10-CM | POA: Diagnosis present

## 2014-06-24 DIAGNOSIS — G40301 Generalized idiopathic epilepsy and epileptic syndromes, not intractable, with status epilepticus: Secondary | ICD-10-CM | POA: Diagnosis present

## 2014-06-24 DIAGNOSIS — I69220 Aphasia following other nontraumatic intracranial hemorrhage: Secondary | ICD-10-CM

## 2014-06-24 DIAGNOSIS — G9340 Encephalopathy, unspecified: Secondary | ICD-10-CM | POA: Diagnosis not present

## 2014-06-24 DIAGNOSIS — Z7401 Bed confinement status: Secondary | ICD-10-CM

## 2014-06-24 DIAGNOSIS — G40901 Epilepsy, unspecified, not intractable, with status epilepticus: Secondary | ICD-10-CM | POA: Diagnosis present

## 2014-06-24 DIAGNOSIS — E119 Type 2 diabetes mellitus without complications: Secondary | ICD-10-CM | POA: Diagnosis present

## 2014-06-24 DIAGNOSIS — E875 Hyperkalemia: Secondary | ICD-10-CM | POA: Diagnosis present

## 2014-06-24 DIAGNOSIS — E870 Hyperosmolality and hypernatremia: Secondary | ICD-10-CM | POA: Diagnosis present

## 2014-06-24 DIAGNOSIS — Z789 Other specified health status: Secondary | ICD-10-CM | POA: Insufficient documentation

## 2014-06-24 DIAGNOSIS — I10 Essential (primary) hypertension: Secondary | ICD-10-CM | POA: Diagnosis present

## 2014-06-24 LAB — I-STAT CHEM 8, ED
BUN: 8 mg/dL (ref 6–20)
CALCIUM ION: 0.79 mmol/L — AB (ref 1.12–1.23)
CHLORIDE: 121 mmol/L — AB (ref 101–111)
Creatinine, Ser: 0.6 mg/dL — ABNORMAL LOW (ref 0.61–1.24)
Glucose, Bld: 57 mg/dL — ABNORMAL LOW (ref 65–99)
HCT: 16 % — ABNORMAL LOW (ref 39.0–52.0)
Hemoglobin: 5.4 g/dL — CL (ref 13.0–17.0)
POTASSIUM: 2.8 mmol/L — AB (ref 3.5–5.1)
Sodium: 148 mmol/L — ABNORMAL HIGH (ref 135–145)
TCO2: 16 mmol/L (ref 0–100)

## 2014-06-24 LAB — CBC
HCT: 41 % (ref 39.0–52.0)
Hemoglobin: 13.7 g/dL (ref 13.0–17.0)
MCH: 25.9 pg — ABNORMAL LOW (ref 26.0–34.0)
MCHC: 33.4 g/dL (ref 30.0–36.0)
MCV: 77.7 fL — AB (ref 78.0–100.0)
PLATELETS: 160 10*3/uL (ref 150–400)
RBC: 5.28 MIL/uL (ref 4.22–5.81)
RDW: 16 % — ABNORMAL HIGH (ref 11.5–15.5)
WBC: 6.1 10*3/uL (ref 4.0–10.5)

## 2014-06-24 LAB — RAPID URINE DRUG SCREEN, HOSP PERFORMED
AMPHETAMINES: NOT DETECTED
Barbiturates: NOT DETECTED
Benzodiazepines: POSITIVE — AB
Cocaine: NOT DETECTED
OPIATES: POSITIVE — AB
Tetrahydrocannabinol: NOT DETECTED

## 2014-06-24 LAB — COMPREHENSIVE METABOLIC PANEL
ALT: 6 U/L — ABNORMAL LOW (ref 17–63)
AST: 35 U/L (ref 15–41)
Albumin: 3.1 g/dL — ABNORMAL LOW (ref 3.5–5.0)
Alkaline Phosphatase: 45 U/L (ref 38–126)
Anion gap: 8 (ref 5–15)
BILIRUBIN TOTAL: 1.2 mg/dL (ref 0.3–1.2)
BUN: 14 mg/dL (ref 6–20)
CHLORIDE: 108 mmol/L (ref 101–111)
CO2: 26 mmol/L (ref 22–32)
CREATININE: 1.23 mg/dL (ref 0.61–1.24)
Calcium: 9 mg/dL (ref 8.9–10.3)
GFR calc non Af Amer: >60 mL/min (ref >60)
GLUCOSE: 104 mg/dL — AB (ref 65–99)
Potassium: 5.5 mmol/L — ABNORMAL HIGH (ref 3.5–5.1)
Sodium: 142 mmol/L (ref 135–145)
Total Protein: 7.2 g/dL (ref 6.5–8.1)

## 2014-06-24 LAB — CBG MONITORING, ED
GLUCOSE-CAPILLARY: 102 mg/dL — AB (ref 65–99)
GLUCOSE-CAPILLARY: 70 mg/dL (ref 65–99)

## 2014-06-24 LAB — MAGNESIUM: Magnesium: 2 mg/dL (ref 1.7–2.4)

## 2014-06-24 LAB — GLUCOSE, CAPILLARY: Glucose-Capillary: 67 mg/dL (ref 65–99)

## 2014-06-24 LAB — VALPROIC ACID LEVEL: Valproic Acid Lvl: 56 ug/mL (ref 50.0–100.0)

## 2014-06-24 LAB — PHOSPHORUS: Phosphorus: 4.5 mg/dL (ref 2.5–4.6)

## 2014-06-24 LAB — ACETAMINOPHEN LEVEL

## 2014-06-24 LAB — ETHANOL: Alcohol, Ethyl (B): <5 mg/dL (ref <5)

## 2014-06-24 MED ORDER — LACOSAMIDE 200 MG/20ML IV SOLN
200.0000 mg | Freq: Two times a day (BID) | INTRAVENOUS | Status: DC
  Administered 2014-06-25 – 2014-06-26 (×3): 200 mg via INTRAVENOUS
  Filled 2014-06-24 (×6): qty 20

## 2014-06-24 MED ORDER — SODIUM CHLORIDE 0.9 % IV SOLN
1500.0000 mg | INTRAVENOUS | Status: DC
  Filled 2014-06-24: qty 15

## 2014-06-24 MED ORDER — LEVETIRACETAM 500 MG/5ML IV SOLN
500.0000 mg | Freq: Once | INTRAVENOUS | Status: DC
  Administered 2014-06-24: 500 mg via INTRAVENOUS
  Filled 2014-06-24: qty 5

## 2014-06-24 MED ORDER — PANTOPRAZOLE SODIUM 40 MG IV SOLR
40.0000 mg | INTRAVENOUS | Status: DC
  Administered 2014-06-24: 40 mg via INTRAVENOUS
  Filled 2014-06-24 (×2): qty 40

## 2014-06-24 MED ORDER — HYDRALAZINE HCL 20 MG/ML IJ SOLN: 10.0000 mg | INTRAMUSCULAR | Status: DC | PRN

## 2014-06-24 MED ORDER — ONDANSETRON HCL 4 MG/2ML IJ SOLN: 4.0000 mg | Freq: Four times a day (QID) | INTRAMUSCULAR | Status: DC | PRN

## 2014-06-24 MED ORDER — SODIUM CHLORIDE 0.9 % IV SOLN
1000.0000 mg | INTRAVENOUS | Status: AC
  Administered 2014-06-24: 1000 mg via INTRAVENOUS
  Filled 2014-06-24: qty 10

## 2014-06-24 MED ORDER — PHENOBARBITAL SODIUM 130 MG/ML IJ SOLN: 20.0000 mg/kg | Freq: Once | INTRAMUSCULAR | Status: DC

## 2014-06-24 MED ORDER — VALPROATE SODIUM 500 MG/5ML IV SOLN
1000.0000 mg | INTRAVENOUS | Status: DC
  Filled 2014-06-24: qty 10

## 2014-06-24 MED ORDER — SODIUM CHLORIDE 0.9 % IV SOLN
1.0000 g | Freq: Once | INTRAVENOUS | Status: AC
  Administered 2014-06-24: 1 g via INTRAVENOUS
  Filled 2014-06-24: qty 10

## 2014-06-24 MED ORDER — LEVETIRACETAM 500 MG/5ML IV SOLN
1000.0000 mg | Freq: Two times a day (BID) | INTRAVENOUS | Status: DC
  Administered 2014-06-25 – 2014-06-26 (×4): 1000 mg via INTRAVENOUS
  Filled 2014-06-24 (×4): qty 10

## 2014-06-24 MED ORDER — SODIUM CHLORIDE 0.9 % IV SOLN
200.0000 mg | INTRAVENOUS | Status: AC
  Administered 2014-06-24: 200 mg via INTRAVENOUS
  Filled 2014-06-24 (×2): qty 20

## 2014-06-24 MED ORDER — VALPROATE SODIUM 500 MG/5ML IV SOLN
500.0000 mg | Freq: Once | INTRAVENOUS | Status: DC
  Administered 2014-06-24: 500 mg via INTRAVENOUS
  Filled 2014-06-24: qty 5

## 2014-06-24 MED ORDER — SODIUM CHLORIDE 0.9 % IV SOLN
1.0000 mg/h | INTRAVENOUS | Status: DC
  Administered 2014-06-24: 1 mg/h via INTRAVENOUS
  Filled 2014-06-24: qty 10

## 2014-06-24 MED ORDER — ONDANSETRON HCL 4 MG PO TABS: 4.0000 mg | ORAL_TABLET | Freq: Four times a day (QID) | ORAL | Status: DC | PRN

## 2014-06-24 MED ORDER — SODIUM CHLORIDE 0.9 % IV SOLN
1.0000 mg/h | INTRAVENOUS | Status: DC
  Filled 2014-06-24: qty 10

## 2014-06-24 MED ORDER — MIDAZOLAM BOLUS VIA INFUSION
2.0000 mg | INTRAVENOUS | Status: DC | PRN
  Filled 2014-06-24: qty 2

## 2014-06-24 MED ORDER — VALPROATE SODIUM 500 MG/5ML IV SOLN
500.0000 mg | INTRAVENOUS | Status: AC
  Administered 2014-06-24: 500 mg via INTRAVENOUS
  Filled 2014-06-24: qty 5

## 2014-06-24 MED ORDER — VALPROATE SODIUM 500 MG/5ML IV SOLN
500.0000 mg | Freq: Two times a day (BID) | INTRAVENOUS | Status: DC
  Administered 2014-06-25: 500 mg via INTRAVENOUS
  Filled 2014-06-24 (×2): qty 5

## 2014-06-24 MED ORDER — HEPARIN SODIUM (PORCINE) 5000 UNIT/ML IJ SOLN
5000.0000 [IU] | Freq: Three times a day (TID) | INTRAMUSCULAR | Status: DC
  Administered 2014-06-24 – 2014-06-25 (×2): 5000 [IU] via SUBCUTANEOUS
  Filled 2014-06-24 (×5): qty 1

## 2014-06-24 MED ORDER — DEXTROSE 5 % IV SOLN
INTRAVENOUS | Status: DC
  Administered 2014-06-24: 100 mL via INTRAVENOUS
  Administered 2014-06-25: 10:00:00 via INTRAVENOUS

## 2014-06-24 NOTE — ED Notes (Signed)
[place] Consulting civil engineer notified of bed status.

## 2014-06-24 NOTE — Care Management (Addendum)
Patient is active with [facility], ED CM contacted [facility] spoke with Brooke Bonito RN on-call concerning patient status. Patient was at [facility] for respite care, and began to have seizures, patient has been placed on versed drip to control seizure activity. Patient being admitted to ICU for continued close monitoring on versed drip.

## 2014-06-24 NOTE — ED Notes (Signed)
Attempted report to [facility]S. 

## 2014-06-24 NOTE — ED Notes (Signed)
CBG is 102. Notified nurse Shanda Bumps.

## 2014-06-24 NOTE — Consult Note (Signed)
Neurology Consultation Reason for Consult: Status Epilepticus Referring Physician: Genene Churn  CC: Status Epilepticus  History is obtained from: Chart  HPI: [name] is a 59 y.o. male with a hsitory of previous stroke  With right hemipareiss and aphasia who during his last hospitalization in January was changed to hospice care. He was in [place] for a respite stay and was refusing to take his medications. He was seen to start seizing today and was brought in via EMS. Here he has had continuous focal right arm and leg seizures.    He had a stroke with post-tpa hemorrhage in 2014 and has had global aphasia since that time. He also has some right sided weakness as well.    ROS:  Unable to obtain due to altered mental status.   Past Medical History  Diagnosis Date  . Stroke 2007  . Hypertension   . Difficult airway for intubation 06/22/2012    Deep and anterior. Difficult with standard laryngoscope. Easily intubated with Glidescope   . Coronary artery disease     Stent placement: 06/19/2000. 05/22/2001. 04/20/2004.  . Malignant hypertension 07/14/2012  . 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 MSSA (methicillin susceptible Staphylococcus aureus) pneumonia 07/03/2012  . CVA (cerebral infarction) 06/22/2012  . Seizure, possible 06/22/2012  . Anxiety state, unspecified   . Zygomatic hyperplasia   . Abnormality of gait 10/03/2013    Family History: Unable to assess secondary to patient's altered mental status.    Social History: Unable to assess secondary to patient's altered mental status.    Exam: Current vital signs: Temp(Src) 99.4 F (37.4 C) (Rectal)  Wt 72.576 kg (160 lb)  SpO2 98% Vital signs in last 24 hours: Temp:  [99.4 F (37.4 C)] 99.4 F (37.4 C) (06/14 1341) SpO2:  [98 %] 98 % (06/14 1327) Weight:  [72.576 kg (160 lb)] 72.576 kg (160 lb) (06/14 1338)  Physical Exam  Constitutional: Appears well-developed and well-nourished.  Psych: does nto respond   Eyes: No scleral injection HENT: No OP obstrucion Head: Normocephalic.  Cardiovascular: tachycardic Respiratory: Effort normal  GI: Soft.  No distension. There is no tenderness.  Skin: WDI  Neuro: Mental Status: Patient is lethargic, does not follow commands Cranial Nerves: II: does not blink to threat Pupils are equal, round, and reactive to light.   III,IV, VI: eyes midline between seizures, deviated to the left during seizures.   V: VII: blinks to eyelid stimulation bilaterally VIII, X, XI, XII: Unable to assess secondary to patient's altered mental status.  Motor: Tone is increased throughout. Bulk is normal. Withdraws to noxiosu stim in the left, flexion in the right.  Sensory: As above Cerebellar: Unable to assess secondary to patient's altered mental status.   I have reviewed labs in epic and the results pertinent to this consultation are: cmp -unremarkable  I have reviewed the images obtained:CT head - old infarcts seen  Impression: 59 yo M with partial status epilepticus in the setting of medication non-compliance. I advised initially to load with depakote and keppra without patient response. On re-evaluation, patient continues to have recurrent partial complex seiuzures amounting to status epilepticus. I loaded the patient with vimpat.   I discussed with both his son and wife that we might not be able to get the seizures under control with regular medications and that more aggressive treatment would involve intubation which he has said he would not want in the past. They both agreed that he would not want intubation and therefore  I would not proceed to a general anesthetic for treatment of this current status.   Recommendations: 1) Depacon 500mg  BID 2) keppra 500mg  BID 3) vimpat 200mg  IV load with 100mg  BID following 4) palliative care consultation   Ritta Slot, MD [facility] [phone]  If 7pm- 7am, please page neurology on call as  listed in AMION.

## 2014-06-24 NOTE — ED Notes (Signed)
Pt arrives from [place] via [place]. EMS reports facility reported pt began having seizures at midnight today and has since had seizures on and off. Currently patient had a tonic clonic seizure lasting about 30 min and has had status elipiticus since. Dr. Fayrene Fearing and Cammy Copa, PA at bedside. [facility] reports pt hasn't had any extended moments of awareness and has remained nonverbal.

## 2014-06-24 NOTE — ED Notes (Signed)
[name] AD on 3S states pt isn't fit for their unit rt Versed drip.

## 2014-06-24 NOTE — ED Provider Notes (Signed)
CSN: [id number]     Arrival date & time 06/24/14  1322 History   First MD Initiated Contact with Patient 06/24/14 1330     Chief Complaint  Patient presents with  . Seizures     (Consider location/radiation/quality/duration/timing/severity/associated sxs/prior Treatment) HPI  [name] Is a 59 year old male with history of massive CVA, who presents from [place] for seizure. He is here temporarily for 5 days for respite care for the family. The patient is normally a phasic with only control of the movement of his neck. He has spastic hemiparesis. We spoke with his hospice nurse. He states that she got a call around midnight last night for seizure-like activity. Patient was given 1 mg of IM Ativan. The patient has refused his Keppra and Depakote over the past few days. The patient's wife stopped and last night and made him take them. Around 12 PM today, approximately 2 hours ago the patient began having a grand mal seizure. Since that time he has had partial status with a left gaze preference and tonic-clonic activity of the face and right side of his body. The patient was given IM, 1 mg of Ativan and 5 mg of rectal Valium without resolution. Upon arrival, he was continuing to seize. The patient is DO NOT RESUSCITATE at this time. Past Medical History  Diagnosis Date  . Stroke 2007  . Hypertension   . Difficult airway for intubation 06/22/2012    Deep and anterior. Difficult with standard laryngoscope. Easily intubated with Glidescope   . Coronary artery disease     Stent placement: 06/19/2000. 05/22/2001. 04/20/2004.  . Malignant hypertension 07/14/2012  . 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 MSSA (methicillin susceptible Staphylococcus aureus) pneumonia 07/03/2012  . CVA (cerebral infarction) 06/22/2012  . Seizure, possible 06/22/2012  . Anxiety state, unspecified   . Zygomatic hyperplasia   . Abnormality of gait 10/03/2013   Past Surgical History  Procedure Laterality  Date  . Coronary stent placement    . Cardiac surgery     Family History  Problem Relation Age of Onset  . Hypertension Mother   . Hypertension Father    History  Substance Use Topics  . Smoking status: Never Smoker   . Smokeless tobacco: Never Used  . Alcohol Use: No    Review of Systems  Unable to perform ROS: Patient unresponsive       Allergies  Review of patient's allergies indicates no known allergies.  Home Medications   Prior to Admission medications   Medication Sig Start Date End Date Taking? Authorizing Provider  acetaminophen (TYLENOL) 325 MG tablet Give 650 mg by tube every 6 (six) hours as needed (elevated temperature).    Historical Provider, MD  aspirin 81 MG chewable tablet Chew 81 mg by mouth daily.    Historical Provider, MD  divalproex (DEPAKOTE SPRINKLE) 125 MG capsule Take 375 mg by mouth 2 (two) times daily.     Historical Provider, MD  levETIRAcetam (KEPPRA) 500 MG tablet Take 250 mg by mouth every 12 (twelve) hours.     Historical Provider, MD  losartan (COZAAR) 50 MG tablet Take 0.5 tablets (25 mg total) by mouth daily. 02/05/14   Christiane Ha, MD  metoprolol tartrate (LOPRESSOR) 25 MG tablet Take 0.5 tablets (12.5 mg total) by mouth 2 (two) times daily. 02/05/14   Christiane Ha, MD  Morphine Sulfate (MORPHINE CONCENTRATE) 10 MG/0.5ML SOLN concentrated solution Take 0.25 mLs (5 mg total) by mouth every 4 (four) hours. And  0.25 ml by mouth hourly as needed for pain or shortness of breath 02/05/14   Christiane Ha, MD  omeprazole (PRILOSEC) 20 MG capsule Take 20 mg by mouth daily.    Historical Provider, MD  polyvinyl alcohol (LIQUIFILM TEARS) 1.4 % ophthalmic solution Place 1 drop into both eyes 2 (two) times daily.    Historical Provider, MD  sennosides-docusate sodium (SENOKOT-S) 8.6-50 MG tablet Take 2 tablets by mouth daily. 02/05/14   Christiane Ha, MD   SpO2 98% Physical Exam  Constitutional: He appears well-developed and  well-nourished.  HENT:  Head: Normocephalic and atraumatic.  Eyes:  Patient with left gaze preference.      ED Course  Procedures (including critical care time) Labs Review Labs Reviewed  CBG MONITORING, ED - Abnormal; Notable for the following:    Glucose-Capillary 102 (*)    All other components within normal limits  COMPREHENSIVE METABOLIC PANEL  MAGNESIUM  PHOSPHORUS  CBC  ACETAMINOPHEN LEVEL  VALPROIC ACID LEVEL  URINE RAPID DRUG SCREEN, HOSP PERFORMED  ETHANOL  I-STAT ARTERIAL BLOOD GAS, ED  I-STAT CHEM 8, ED    Imaging Review No results found.   EKG Interpretation   Date/Time:  Tuesday June 24 2014 13:24:16 EDT Ventricular Rate:  61 PR Interval:  129 QRS Duration: 73 QT Interval:  430 QTC Calculation: 433 R Axis:   52 Text Interpretation:  Sinus rhythm LVH with secondary repolarization  abnormality Probable anterior infarct, age indeterminate Confirmed by  Fayrene Fearing  MD, MARK ([id number]) on 06/24/2014 5:15:57 PM      CRITICAL CARE Performed by: Arthor Captain   Total critical care time: 30  Critical care time was exclusive of separately billable procedures and treating other patients.  Critical care was necessary to treat or prevent imminent or life-threatening deterioration.  Critical care was time spent personally by me on the following activities: development of treatment plan with patient and/or surrogate as well as nursing, discussions with consultants, evaluation of patient's response to treatment, examination of patient, obtaining history from patient or surrogate, ordering and performing treatments and interventions, ordering and review of laboratory studies, ordering and review of radiographic studies, pulse oximetry and re-evaluation of patient's condition.   MDM   Final diagnoses:  Status epilepticus    1:59 PM Temp(Src) 99.4 F (37.4 C) (Rectal)  Wt 160 lb (72.576 kg)  SpO2 98% Patient seen in shared visit with Dr. Fayrene Fearing. Dr.Kirkpatrik  of Neuro involved quickly with the case. The patient was loaded with his keppra and depakote.  The family maintains his DNR/DNI status and has refused intubation for status.   Patient given Loading dose of Keppra. I had a long discussion with Dr. Phillips Odor w/ palliative care who will admit the patient along with Dr. Butler Denmark to the stepdown floor. The patient will be placedon Versed Infusionof /hr with  boluses Q30 minutes to break the seizure. Dr. Phillips Odor asks to be contacted if he remains in status. She can increase the frequency of the drip. The patient at this point has been in status for over 5 hours.  His CT shows no acute infarcts.  EKG is unchaged. Labs show hyperkalemia    Arthor Captain, PA-C 06/25/14 1441  Rolland Porter, MD 06/23/2014 [id number]

## 2014-06-24 NOTE — ED Notes (Signed)
[name], AD from 3S called and states bed placement informed her ICU bed needs to be requested for pt.

## 2014-06-24 NOTE — ED Provider Notes (Signed)
Pt seen and evaluated with Arthor Captain PA.  Pt with Partial Sz/Status Epilepticus.  Loaded with IV Keppra and IV Depakote.  Neurologist consulted.  Per family and chart, pt is DNR. Agree with chart and plan per PA.    CRITICAL CARE Performed by: Rolland Porter JOSEPH   Total critical care time: 30 Minutes.  Critical care time was exclusive of separately billable procedures and treating other patients.  Critical care was necessary to treat or prevent imminent or life-threatening deterioration.  Critical care was time spent personally by me on the following activities: development of treatment plan with patient and/or surrogate as well as nursing, discussions with consultants, evaluation of patient's response to treatment, examination of patient, obtaining history from patient or surrogate, ordering and performing treatments and interventions, ordering and review of laboratory studies, ordering and review of radiographic studies, pulse oximetry and re-evaluation of patient's condition. Care   Rolland Porter, MD 06/24/14 1401

## 2014-06-24 NOTE — Progress Notes (Signed)
ED RN visit [facility] ED Pod E-Room 45 [facility] Chelsea Aus RN,BSN  Patient to ED via EMS from [facility] for Status Epilepticus. Patient is a DNR. Patient is a [facility] home patient that was at [facility] for a respite stay.  Prior to calling EMS 5 mg Ativan administered with no improvement noted in seizure activity.  Patient seen with wife and daughter at bedside.  Patient on 2L [place] and receiving Depakote via peripheral IV. Patient nonverbal.  Family tearful and upset at change in patient's condition while in respite care.  Patient actively having seizures. Spoke with ED RN and family still in discussion for treatment options to control seizures.  Offered emotional support to the family. Home care RN aware of patient's condition.  [facility] will continue to follow.   Please call with any questions.  Chelsea Aus RN, BSN [facility] Liaison [phone]

## 2014-06-24 NOTE — ED Notes (Signed)
Spoke with CCM. CCM states pt needs to be taken off versed drip rt pt being unresponsive. CCM states to change pt to versed push q1-2 hrs PRN seizure like activity. Admitting needs to be called and asked to change order per CCM and pt can remain on 3S. Windy Kalata, RN notified.

## 2014-06-24 NOTE — ED Notes (Signed)
DR. Merdis Delay states that the pt can be dc Versed gtt and and started IV push PRN q1-2 hours.

## 2014-06-24 NOTE — ED Notes (Signed)
This charge RN spoke at [employer] with house supervisor and rapid response RN regarding patient case. Additionally spoke with case manager as patient is under hospice care. At this time patient appears to still exhibit seizure activity in spite of administered medications and continued Versed drip. Per case manager Burna Mortimer, RN family wishes to continue treatment for seizures at this time. This RN spoke with critical care MD Celine Mans. CCM currently at bedside to evaluate patient as current course of treatment prevents admission to stepdown unit.

## 2014-06-24 NOTE — H&P (Signed)
PULMONARY / CRITICAL CARE MEDICINE   Name: [name] MRN: [id number] DOB: May 24, 1955    ADMISSION DATE:  06/24/2014 CONSULTATION DATE:  06/24/2014  REFERRING MD :  EDP  CHIEF COMPLAINT:  Status epilepticus  INITIAL PRESENTATION:  59 y.o. M with hx seizure disorder, brought to [facility] ED 6/14 from [place] where he was found to have TC seizure's after refusing to take his medications / medication non-compliance.  In ED, he failed initial load of depakote and keppra.  Was then started on vimpat and midazolam infusion.  PCCM called for admission to ICU because of need of midazolam infusion.     STUDIES:  CT head 6/14 >>> no acute process.  Extensive old infarcts with an old left posterior parietal intracerebral hematoma.  Progressive atrophy and secondary ventricular dilatation.  SIGNIFICANT EVENTS: 6/14 - admit with status epilepticus.   HISTORY OF PRESENT ILLNESS:  Pt is encephalopathic; therefore, this HPI is obtained from chart review. [name] is a 59 y.o. M with PMH as outlined below.  He was brought to [facility] ED 6/14 from [place] where he was spending 5 days in respite care.  While at [facility], he apparently was refusing to take all medications.  He proceeded to have a TC seizure around midnight 6/14 and had another seizure later that morning.  Since then, he had intermittent seizures throughout the day prior to arriving at ED.  He would never regain consciousness between each seizure.    While in ED, he was shaking his right arm and leg.  He was evaluated by neurology who loaded him with depakote and keppra without any response.  He was later loaded with vimpat and was started on a midazolam infusion.  He was noted to be DNR which was confirmed with his son and wife.  For this reason, intubation was deferred.  He apparently had a stroke with post - tpa hemorrhage in 2014 and has had global aphasia since that time along with some right sided weakness.   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  has past surgical history that includes Coronary stent placement and Cardiac surgery. Prior to Admission medications   Medication Sig Start Date End Date Taking? Authorizing Provider  acetaminophen (TYLENOL) 325 MG tablet Give 650 mg by tube every 6 (six) hours as needed (elevated temperature).   Yes Historical Provider, MD  aspirin 81 MG chewable tablet Chew 81 mg by mouth daily.   Yes Historical Provider, MD  divalproex (DEPAKOTE) 125 MG DR tablet Take 375 mg by mouth 2 (two) times daily.   Yes Historical Provider, MD  guaifenesin (ROBITUSSIN) 100 MG/5ML syrup Take 200 mg by mouth 4 (four) times daily as needed for cough.   Yes Historical Provider, MD  haloperidol (HALDOL) 2 MG tablet Take 4 mg by mouth every 4 (four) hours as needed for agitation.   Yes Historical Provider, MD  levETIRAcetam (KEPPRA) 500 MG tablet Take 250 mg by mouth every 12 (twelve) hours.    Yes Historical Provider, MD  LORazepam (ATIVAN) 0.5 MG tablet Take 0.5 mg by mouth every 4 (four) hours as needed for anxiety (restlessness).   Yes Historical Provider, MD  LORazepam (ATIVAN) 2 MG/ML concentrated  solution Take 0.5 mg by mouth every 4 (four) hours as needed for seizure (agitation).   Yes Historical Provider, MD  losartan (COZAAR) 100 MG tablet Take 100 mg by mouth daily.   Yes Historical Provider, MD  metoprolol tartrate (LOPRESSOR) 25 MG tablet Take 0.5 tablets (12.5 mg total) by mouth 2 (two) times daily. Patient taking differently: Take 25 mg by mouth 2 (two) times daily.  02/05/14   Yes Christiane Ha, MD  omeprazole (PRILOSEC) 20 MG capsule Take 20 mg by mouth daily.   Yes Historical Provider, MD  polyvinyl alcohol (LIQUIFILM TEARS) 1.4 % ophthalmic solution Place 1 drop into both eyes 2 (two) times daily.   Yes Historical Provider, MD  sennosides-docusate sodium (SENOKOT-S) 8.6-50 MG tablet Take 2 tablets by mouth daily. 02/05/14  Yes Christiane Ha, MD  sertraline (ZOLOFT) 50 MG tablet Take 50 mg by mouth daily.   Yes Historical Provider, MD  Morphine Sulfate (MORPHINE CONCENTRATE) 10 MG/0.5ML SOLN concentrated solution Take 0.25 mLs (5 mg total) by mouth every 4 (four) hours. And 0.25 ml by mouth hourly as needed for pain or shortness of breath Patient not taking: Reported on 06/24/2014 02/05/14   Christiane Ha, MD   No Known Allergies  FAMILY HISTORY:  Family History  Problem Relation Age of Onset  . Hypertension Mother   . Hypertension Father     SOCIAL HISTORY:  reports that he has never smoked. He has never used smokeless tobacco. He reports that he does not drink alcohol or use illicit drugs.  REVIEW OF SYSTEMS:  Unable to obtain as pt is encephalopathic.  SUBJECTIVE:   VITAL SIGNS: Temp:  [99.4 F (37.4 C)] 99.4 F (37.4 C) (06/14 1341) Pulse Rate:  [48-63] 63 (06/14 1930) Resp:  [11-21] 20 (06/14 1930) BP: (110-160)/(63-88) 125/70 mmHg (06/14 1930) SpO2:  [98 %-100 %] 100 % (06/14 1930) Weight:  [72.576 kg (160 lb)] 72.576 kg (160 lb) (06/14 1338) HEMODYNAMICS:   VENTILATOR SETTINGS:   INTAKE / OUTPUT: Intake/Output    None     PHYSICAL EXAMINATION: General: Adult male, in NAD. Neuro: Does not follow commands or answer questions. HEENT: [place]/AT. PERRL, sclerae anicteric. Cardiovascular: RRR, no M/R/G.  Lungs: Respirations even and unlabored.  CTA bilaterally, No W/R/R. Abdomen: BS x 4, soft, NT/ND.  Musculoskeletal: No gross deformities, no edema.  Skin: Intact, warm, no rashes.  LABS:  CBC  Recent Labs Lab  06/24/14 1335 06/24/14 1511  WBC 6.1  --   HGB 13.7 5.4*  HCT 41.0 16.0*  PLT 160  --    Coag's No results for input(s): APTT, INR in the last 168 hours. BMET  Recent Labs Lab 06/24/14 1335 06/24/14 1511  NA 142 148*  K 5.5* 2.8*  CL 108 121*  CO2 26  --   BUN 14 8  CREATININE 1.23 0.60*  GLUCOSE 104* 57*   Electrolytes  Recent Labs Lab 06/24/14 1335  CALCIUM 9.0  MG 2.0  PHOS 4.5   Sepsis Markers No results for input(s): LATICACIDVEN, PROCALCITON, O2SATVEN in the last 168 hours. ABG No results for input(s): PHART, PCO2ART, PO2ART in the last 168 hours. Liver Enzymes  Recent Labs Lab 06/24/14 1335  AST 35  ALT 6*  ALKPHOS 45  BILITOT 1.2  ALBUMIN 3.1*   Cardiac Enzymes No results for input(s): TROPONINI, PROBNP in the last 168 hours. Glucose  Recent Labs Lab 06/24/14 1340  GLUCAP 102*    Imaging Ct Head Wo Contrast  06/24/2014  CLINICAL DATA:  Seizures.  History of stroke and 1 prior seizure.  EXAM: CT HEAD WITHOUT CONTRAST  TECHNIQUE: Contiguous axial images were obtained from the base of the skull through the vertex without intravenous contrast.  COMPARISON:  CT scans dated 07/29/2013 and 09/24/2012 and MRI dated 09/24/2012  FINDINGS: No acute intracranial hemorrhage, acute infarction, or new intracranial mass lesion.  Multiple old infarcts in the left middle cerebral artery distribution with a chronic intracerebral hematoma in the left posterior parietal lobe now with slight peripheral calcification. Secondary encephalomalacia.  Multiple other old small lacunar infarcts bilaterally with very extensive periventricular white matter lucency consistent with chronic small vessel ischemic disease.  Secondary prominent ventricular dilatation, increased since the prior study. Moderate cerebellar atrophy, stable.  No acute osseous abnormality.  IMPRESSION: No acute abnormalities. Extensive old infarcts with an old left posterior parietal intracerebral hematoma.  Progressive atrophy and secondary ventricular dilatation.   Electronically Signed   By: Francene Boyers M.D.   On: 06/24/2014 14:41    ASSESSMENT / PLAN:  NEUROLOGIC A:   Status epilepticus. Hx CVA, seizure disorder, anxiety. P:   Neurology following. AED's per neurology. Continue midazolam infusion. Palliative care consult.  PULMONARY A: At risk aspiration. DNI status (and has hx of difficult airway). P:   Pulmonary hygiene.  CARDIOVASCULAR A:  Hx HTN, CAD s/p stent x 3, ICM (EF 35 - 40% from echo in Jan 2016). P:  Hydralazine PRN. Hold outpatient losartan, lopressor.  RENAL A:   Mild hypernatremia. Hypokalemia ?  - initial BMP with hyperkalemia. Hypocalcemia. P:   Repeat BMP now. D5W @ 100. 1g Ca gluconate. Check lactate. BMP in AM.  GASTROINTESTINAL A:   Nutrition. P:   NPO.  HEMATOLOGIC A:   VTE Prophylaxis. P:  SCD's / Heparin. CBC in AM.  INFECTIOUS A:   No indication of infection. P:   Check UA, PCT for completeness.  ENDOCRINE A:   No known issues. P:   Check CBG, TSH.   Family updated: Son and Wife by [facility] MD and neurology MD.  Interdisciplinary Family Meeting v Palliative Care Meeting:  Due by: 6/20.  CC time:  35 minutes.   Rutherford Guys, [place] [facility] Pager: [phone]  or [phone] 06/24/2014, 8:50 PM  PCCM ATTENDING: I have reviewed pt's initial presentation, consultants notes and hospital database in detail.  The above assessment and plan was formulated under my direction.  In summary: Chronic debilitation after devestating CVAs Chronic seizure D/O Status epilepticus Admitted to ICU because was on midaz gtt Neuro following  I spent 20 + minutes with family discussing goals of care. They feel strongly that preservation of his dignity should be our highest priority. We have stopped midaz gtt and VPA has been changed to phenytoin per Neurology recs. Plan is for transfer to  [facility] when they have a bed available. In meantime, will transfer to Palliative Care floor.    Billy Fischer, MD;  PCCM service; Mobile [phone]

## 2014-06-24 NOTE — H&P (Signed)
[facility] History and Physical  ARDON FRANKLIN ZOX:[id number] DOB: August 23, 1955 DOA: 06/24/2014   PCP: Terald Sleeper, MD    Chief Complaint: Brought in for seizures  HPI: [name] is a 59 y.o. male with a history of CVA, coronary artery disease/MI, hypertension, seizure disorder who has been on hospice since January of this year. The patient was transitioned from home to [place] for 5 days of respite care. While at [facility], he was refusing to take his medications. He had a tonic-clonic seizure last night around midnight and again this morning and has been having intermittent seizures since then without return of consciousness. According to the staff, in the ER, he is mostly had shaking of his right arm and leg The patient is being admitted and will be started on a Versed infusion by palliative care.  ROS: unable to obtain   Past Medical History  Diagnosis Date  . Stroke 2007  . Hypertension   . Difficult airway for intubation 06/22/2012    Deep and anterior. Difficult with standard laryngoscope. Easily intubated with Glidescope   . Coronary artery disease     Stent placement: 06/19/2000. 05/22/2001. 04/20/2004.  . Malignant hypertension 07/14/2012  . 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 MSSA (methicillin susceptible Staphylococcus aureus) pneumonia 07/03/2012  . CVA (cerebral infarction) 06/22/2012  . Seizure, possible 06/22/2012  . Anxiety state, unspecified   . Zygomatic hyperplasia   . Abnormality of gait 10/03/2013    Past Surgical History  Procedure Laterality Date  . Coronary stent placement    . Cardiac surgery      Social History: Lives at home but has been at [facility] for 4 days   No Known Allergies  Family history:   Family History  Problem Relation Age of Onset  . Hypertension Mother   . Hypertension Father      Prior to Admission medications   Medication Sig Start Date End Date Taking? Authorizing Provider  acetaminophen  (TYLENOL) 325 MG tablet Give 650 mg by tube every 6 (six) hours as needed (elevated temperature).   Yes Historical Provider, MD  aspirin 81 MG chewable tablet Chew 81 mg by mouth daily.   Yes Historical Provider, MD  divalproex (DEPAKOTE) 125 MG DR tablet Take 375 mg by mouth 2 (two) times daily.   Yes Historical Provider, MD  guaifenesin (ROBITUSSIN) 100 MG/5ML syrup Take 200 mg by mouth 4 (four) times daily as needed for cough.   Yes Historical Provider, MD  haloperidol (HALDOL) 2 MG tablet Take 4 mg by mouth every 4 (four) hours as needed for agitation.   Yes Historical Provider, MD  levETIRAcetam (KEPPRA) 500 MG tablet Take 250 mg by mouth every 12 (twelve) hours.    Yes Historical Provider, MD  LORazepam (ATIVAN) 0.5 MG tablet Take 0.5 mg by mouth every 4 (four) hours as needed for anxiety (restlessness).   Yes Historical Provider, MD  LORazepam (ATIVAN) 2 MG/ML concentrated solution Take 0.5 mg by mouth every 4 (four) hours as needed for seizure (agitation).   Yes Historical Provider, MD  losartan (COZAAR) 100 MG tablet Take 100 mg by mouth daily.   Yes Historical Provider, MD  metoprolol tartrate (LOPRESSOR) 25 MG tablet Take 0.5 tablets (12.5 mg total) by mouth 2 (two) times daily. Patient taking differently: Take 25 mg by mouth 2 (two) times daily.  02/05/14  Yes Christiane Ha, MD  omeprazole (PRILOSEC) 20 MG capsule Take 20 mg by mouth daily.   Yes Historical  Provider, MD  polyvinyl alcohol (LIQUIFILM TEARS) 1.4 % ophthalmic solution Place 1 drop into both eyes 2 (two) times daily.   Yes Historical Provider, MD  sennosides-docusate sodium (SENOKOT-S) 8.6-50 MG tablet Take 2 tablets by mouth daily. 02/05/14  Yes Christiane Ha, MD  sertraline (ZOLOFT) 50 MG tablet Take 50 mg by mouth daily.   Yes Historical Provider, MD  Morphine Sulfate (MORPHINE CONCENTRATE) 10 MG/0.5ML SOLN concentrated solution Take 0.25 mLs (5 mg total) by mouth every 4 (four) hours. And 0.25 ml by mouth hourly as  needed for pain or shortness of breath Patient not taking: Reported on 06/24/2014 02/05/14   Christiane Ha, MD     Physical Exam: Filed Vitals:   06/24/14 1545 06/24/14 1600 06/24/14 1615 06/24/14 1630  BP: 140/74 120/63 121/72 126/79  Pulse: 53 55 58 59  Temp:      TempSrc:      Resp: 14 16 14 16   Weight:      SpO2: 100% 98% 100% 100%     General: Unresponsive, lethargic HEENT: Normocephalic and Atraumatic, Mucous membranes pink                PERRLA; EOM intact; No scleral icterus,                 Nares: Patent, Oropharynx: Clear,                 Neck: FROM, no cervical lymphadenopathy, thyromegaly, carotid bruit or JVD;  Breasts: deferred CHEST WALL: No tenderness  CHEST: Normal respiration, clear to auscultation bilaterally  HEART: Regular rate and rhythm; no murmurs rubs or gallops  BACK: No kyphosis or scoliosis; no CVA tenderness  GI: Positive Bowel Sounds, soft, non-tender; no masses, no organomegaly Rectal Exam: deferred MSK: No cyanosis, clubbing, or edema Genitalia: not examined  SKIN:  no rash or ulceration  CNS: Nonfocal exam, CN 2-12 intact  Labs on Admission:  Basic Metabolic Panel:  Recent Labs Lab 06/24/14 1335 06/24/14 1511  NA 142 148*  K 5.5* 2.8*  CL 108 121*  CO2 26  --   GLUCOSE 104* 57*  BUN 14 8  CREATININE 1.23 0.60*  CALCIUM 9.0  --   MG 2.0  --   PHOS 4.5  --    Liver Function Tests:  Recent Labs Lab 06/24/14 1335  AST 35  ALT 6*  ALKPHOS 45  BILITOT 1.2  PROT 7.2  ALBUMIN 3.1*   No results for input(s): LIPASE, AMYLASE in the last 168 hours. No results for input(s): AMMONIA in the last 168 hours. CBC:  Recent Labs Lab 06/24/14 1335 06/24/14 1511  WBC 6.1  --   HGB 13.7 5.4*  HCT 41.0 16.0*  MCV 77.7*  --   PLT 160  --    Cardiac Enzymes: No results for input(s): CKTOTAL, CKMB, CKMBINDEX, TROPONINI in the last 168 hours.  BNP (last 3 results) No results for input(s): BNP in the last 8760 hours.  ProBNP  (last 3 results) No results for input(s): PROBNP in the last 8760 hours.  CBG:  Recent Labs Lab 06/24/14 1340  GLUCAP 102*    Radiological Exams on Admission: Ct Head Wo Contrast  06/24/2014   CLINICAL DATA:  Seizures.  History of stroke and 1 prior seizure.  EXAM: CT HEAD WITHOUT CONTRAST  TECHNIQUE: Contiguous axial images were obtained from the base of the skull through the vertex without intravenous contrast.  COMPARISON:  CT scans dated 07/29/2013 and 09/24/2012 and  MRI dated 09/24/2012  FINDINGS: No acute intracranial hemorrhage, acute infarction, or new intracranial mass lesion.  Multiple old infarcts in the left middle cerebral artery distribution with a chronic intracerebral hematoma in the left posterior parietal lobe now with slight peripheral calcification. Secondary encephalomalacia.  Multiple other old small lacunar infarcts bilaterally with very extensive periventricular white matter lucency consistent with chronic small vessel ischemic disease.  Secondary prominent ventricular dilatation, increased since the prior study. Moderate cerebellar atrophy, stable.  No acute osseous abnormality.  IMPRESSION: No acute abnormalities. Extensive old infarcts with an old left posterior parietal intracerebral hematoma. Progressive atrophy and secondary ventricular dilatation.   Electronically Signed   By: Francene Boyers M.D.   On: 06/24/2014 14:41    Assessment/Plan Active Problems:   Status epilepticus/visual disorder -Neurology has attempting to break status in the ER with Depakote, Keppra and Vimpat which has been unsuccessful. -I asked for a palliative care consult-Versed infusion to be initiated per palliative care-patient will be managed in the stepdown unit because of the infusion- wife is at bedside and agrees with this plan  History of hemorrhagic CVA -subsequently a phasic and bedbound History of diabetes mellitus 2 History of NSTEMI lash ischemic cardiomyopathy   Consulted:  Palliative care, Neurology   Code Status: DNR Family Communication: his wife Buckner [place] - [phone]   Time spent: 45 min  Landree Fernholz, MD [facility]  If 7PM-7AM, please contact night-coverage [email or web address] 06/24/2014, 5:23 PM

## 2014-06-25 ENCOUNTER — Encounter: Payer: Self-pay | Admitting: Internal Medicine

## 2014-06-25 ENCOUNTER — Inpatient Hospital Stay (HOSPITAL_COMMUNITY)

## 2014-06-25 DIAGNOSIS — G40301 Generalized idiopathic epilepsy and epileptic syndromes, not intractable, with status epilepticus: Secondary | ICD-10-CM

## 2014-06-25 DIAGNOSIS — G9340 Encephalopathy, unspecified: Secondary | ICD-10-CM

## 2014-06-25 LAB — URINALYSIS, ROUTINE W REFLEX MICROSCOPIC
BILIRUBIN URINE: NEGATIVE
GLUCOSE, UA: NEGATIVE mg/dL
HGB URINE DIPSTICK: NEGATIVE
KETONES UR: NEGATIVE mg/dL
Leukocytes, UA: NEGATIVE
Nitrite: POSITIVE — AB
PH: 5 (ref 5.0–8.0)
Protein, ur: NEGATIVE mg/dL
Specific Gravity, Urine: 1.024 (ref 1.005–1.030)
Urobilinogen, UA: 0.2 mg/dL (ref 0.0–1.0)

## 2014-06-25 LAB — BASIC METABOLIC PANEL
Anion gap: 10 (ref 5–15)
Anion gap: 12 (ref 5–15)
BUN: 12 mg/dL (ref 6–20)
BUN: 13 mg/dL (ref 6–20)
CALCIUM: 9.1 mg/dL (ref 8.9–10.3)
CHLORIDE: 108 mmol/L (ref 101–111)
CO2: 25 mmol/L (ref 22–32)
CO2: 21 mmol/L — ABNORMAL LOW (ref 22–32)
CREATININE: 1.16 mg/dL (ref 0.61–1.24)
Calcium: 9.1 mg/dL (ref 8.9–10.3)
Chloride: 109 mmol/L (ref 101–111)
Creatinine, Ser: 1.1 mg/dL (ref 0.61–1.24)
GFR calc Af Amer: >60 mL/min (ref >60)
GFR calc non Af Amer: >60 mL/min (ref >60)
GFR calc non Af Amer: >60 mL/min (ref >60)
GLUCOSE: 87 mg/dL (ref 65–99)
Glucose, Bld: 109 mg/dL — ABNORMAL HIGH (ref 65–99)
Potassium: 3.9 mmol/L (ref 3.5–5.1)
Potassium: 6 mmol/L — ABNORMAL HIGH (ref 3.5–5.1)
Sodium: 144 mmol/L (ref 135–145)
Sodium: 141 mmol/L (ref 135–145)

## 2014-06-25 LAB — CBC
HEMATOCRIT: 41.4 % (ref 39.0–52.0)
Hemoglobin: 13.4 g/dL (ref 13.0–17.0)
MCH: 25.4 pg — ABNORMAL LOW (ref 26.0–34.0)
MCHC: 32.4 g/dL (ref 30.0–36.0)
MCV: 78.6 fL (ref 78.0–100.0)
Platelets: 87 10*3/uL — ABNORMAL LOW (ref 150–400)
RBC: 5.27 MIL/uL (ref 4.22–5.81)
RDW: 16.1 % — ABNORMAL HIGH (ref 11.5–15.5)
WBC: 8.6 10*3/uL (ref 4.0–10.5)

## 2014-06-25 LAB — URINE MICROSCOPIC-ADD ON

## 2014-06-25 LAB — HEMOGLOBIN AND HEMATOCRIT, BLOOD
HEMATOCRIT: 41.4 % (ref 39.0–52.0)
HEMOGLOBIN: 13.1 g/dL (ref 13.0–17.0)

## 2014-06-25 LAB — TSH: TSH: 0.916 u[IU]/mL (ref 0.350–4.500)

## 2014-06-25 LAB — PHOSPHORUS: Phosphorus: 3.7 mg/dL (ref 2.5–4.6)

## 2014-06-25 LAB — MRSA PCR SCREENING: MRSA BY PCR: NEGATIVE

## 2014-06-25 LAB — LACTIC ACID, PLASMA: LACTIC ACID, VENOUS: 1.8 mmol/L (ref 0.5–2.0)

## 2014-06-25 LAB — MAGNESIUM: MAGNESIUM: 1.8 mg/dL (ref 1.7–2.4)

## 2014-06-25 LAB — PROCALCITONIN

## 2014-06-25 LAB — AMMONIA: AMMONIA: 60 umol/L — AB (ref 9–35)

## 2014-06-25 MED ORDER — CHLORHEXIDINE GLUCONATE 0.12 % MT SOLN
15.0000 mL | Freq: Two times a day (BID) | OROMUCOSAL | Status: DC
  Administered 2014-06-25 – 2014-06-26 (×3): 15 mL via OROMUCOSAL
  Filled 2014-06-25 (×2): qty 15

## 2014-06-25 MED ORDER — CETYLPYRIDINIUM CHLORIDE 0.05 % MT LIQD: 7.0000 mL | Freq: Two times a day (BID) | OROMUCOSAL | Status: DC

## 2014-06-25 MED ORDER — SODIUM CHLORIDE 0.9 % IV SOLN
500.0000 mg | Freq: Once | INTRAVENOUS | Status: AC
  Administered 2014-06-25: 500 mg via INTRAVENOUS
  Filled 2014-06-25: qty 10

## 2014-06-25 MED ORDER — PHENYTOIN SODIUM 50 MG/ML IJ SOLN
100.0000 mg | Freq: Three times a day (TID) | INTRAMUSCULAR | Status: DC
  Administered 2014-06-25 – 2014-06-26 (×4): 100 mg via INTRAVENOUS
  Filled 2014-06-25 (×7): qty 2

## 2014-06-25 MED ORDER — MORPHINE SULFATE 2 MG/ML IJ SOLN: 2.0000 mg | INTRAMUSCULAR | Status: DC | PRN

## 2014-06-25 MED ORDER — LORAZEPAM 2 MG/ML IJ SOLN: 2.0000 mg | INTRAMUSCULAR | Status: DC | PRN

## 2014-06-25 MED ORDER — DEXTROSE-NACL 5-0.45 % IV SOLN
INTRAVENOUS | Status: DC
  Administered 2014-06-25 – 2014-06-26 (×2): via INTRAVENOUS

## 2014-06-25 MED ORDER — CETYLPYRIDINIUM CHLORIDE 0.05 % MT LIQD
7.0000 mL | Freq: Two times a day (BID) | OROMUCOSAL | Status: DC
  Administered 2014-06-25 – 2014-06-26 (×3): 7 mL via OROMUCOSAL

## 2014-06-25 NOTE — Progress Notes (Signed)
[name] [facility] 62M Room 11-[facility]-RN Visit-Stacie Meredith Mody, RN, BSN  Related admission to [facility] disagnosis of CVA. Patient is a DNR. Patient seen in room with family at bedside.  Patient appears comfortable and remains nonverbal.  Staff reports he responds to painful stimuli.  Patient currently receiving IV Midazolam at the rate of 1mg /hr via PIV. Also, Receiving Vimpat, Keppra and Dilantin IV for status epilepticus. Per chart review, patient continues to have seizures despite multiple seizure medications. Family verbalized wishes to keep patient comfortable. [facility] social worker to see patient this morning and [facility] will assess for potential discharge options.  [facility] will continue to follow and anticipate any discharge needs.  Please call [facility] for any questions.   Chelsea Aus RN, BSN [facility] Liaison [phone]

## 2014-06-25 NOTE — Progress Notes (Addendum)
[facility] Liaison spoke with Greer Ee, RN at [facility] about obtaining a bed for transfer. Patient's information reviewed by [facility] physician and plan is to move patient tomorrow to [facility] Liasion spoke with staff nurse on 6 N and requested order to have a PICC line placed to give IV meds as ordered at [facility]. Please call with any questions.  Sharen Heck RN [facility] Liaison [phone].  420 pm Chelsea Aus RN, [facility] Liaison spoke with pt's wife and son by phone who both agreed to PICC placement. Staff nurse Elita Quick advised of this and will have consent signed when family comes in.  Sharen Heck RN

## 2014-06-25 NOTE — Progress Notes (Signed)
Pt rcvd to 6N25 via bed from 2M11.  Pt unresponsive.  PT not responding to painful stimuli.  Pt does open eyes on occasion.  Pt has 20G to rt hand and 22G to lt hand with fluids infusing.  Pt on 3L O2 via [place].  Pt has condom cath in place draining clear yellow urine.  Will continue to monitor.

## 2014-06-25 NOTE — Assessment & Plan Note (Signed)
Co;morbidity leading to Hospice;cont home meds

## 2014-06-25 NOTE — Progress Notes (Signed)
At patient bedside to place midline catheter but unable to do so tonight.  Patient will require two PICC RNs due to positioning needs with vein size and arm stiffness.  Harriett Sine, RN aware as well as wife.  Plan is to have ML placed in the morning when there will be two PICC RNs available. Vonetta Foulk, Lajean Manes, RN VAST

## 2014-06-25 NOTE — Care Management Note (Signed)
Case Management Note  Patient Details  Name: [name] MRN: [id number] Date of Birth: [birth year]  Subjective/Objective:       Lives at home with wife and [facility]. Is total care.   Placed in respite care at [facility] for family to attend a graduation in [place].  Hospice aware that he is here and they are following.  Working with our SW here for coordination of placement at [facility].            Action/Plan:   Expected Discharge Date:                  Expected Discharge Plan:  Skilled Nursing Facility  In-House Referral:     Discharge planning Services     Post Acute Care Choice:    Choice offered to:     DME Arranged:    DME Agency:     HH Arranged:    HH Agency:     Status of Service:  In process, will continue to follow  Medicare Important Message Given:    Date Medicare IM Given:    Medicare IM give by:    Date Additional Medicare IM Given:    Additional Medicare Important Message give by:     If discussed at Long Length of Stay Meetings, dates discussed:    Additional Comments:  Vangie Bicker, RN 06/25/2014, 1:39 PM

## 2014-06-25 NOTE — Assessment & Plan Note (Signed)
Continue on keppra and depakote as seizure prophylaxis.

## 2014-06-25 NOTE — Assessment & Plan Note (Signed)
Prior in 01/2014 which along with co-morbidities led to Hospice

## 2014-06-25 NOTE — Progress Notes (Signed)
Subjective: Continues to have seizures.   Exam: Filed Vitals:   06/25/14 0800  BP: 164/84  Pulse: 81  Temp:   Resp: 19   Gen: In bed, not intubated Resp: normal effort MS: does notopen eyes or follow commands QV:ZDGLO, roving eye movements Motor: increased tone throughout, has some focal right thigh twitching Sensory: flexion to nox stim x 4.   Pertinent Labs: Ammonia 60  Impression: 59 yo M with previous bilateral strokes causing severe disability including global aphasia. He refused meds while in respite care and has gone into status epilepticus. Currently on midazolam drip  As well as depakote, keppra, vimpat.   His elevated ammonia level is likely related to depakote. It is unclear if this is of any clinical significance. In patients with encephalopathy secondary to hyperammoneamia, depakote would to be stopped but without encephalopathy, I would not change management based on this number. If EEG shws continued status, changing depakote to dilantin may be a prudent next step in management.   Recommendations: 1) continue vimpat 200mg  Q12H 2) continue keppra 1gm Q12H 3) continue depakote 500mg  BID for now 4) EEG  Ritta Slot, MD [facility] [phone]  If 7pm- 7am, please page neurology on call as listed in [facility]ION.

## 2014-06-25 NOTE — Progress Notes (Signed)
[facility] Work note LCSW arrived on unit to find no family in room, yet CM, Judson Roch reports that family are meeting with MD regarding goals of care. LCSW eventually met with wife, daughter, son, brother to review discharge plans. Family are in agreement regarding Optomet[facility] for eol. Patient condition is poor and caring for him at home is not an option. Family desire comfort care for him and desire end to his suffering. LCSW offered education about [employer]- services, costs.  LCSW coordinated with RN Queen Blossom and Lattie Haw regarding family desire for [employer] and met with CSW, Marjorie Smolder to coordinate. LCSW waiting for confirmation that patient meets eligibility for [facility].  Marcelene Butte, [place]

## 2014-06-25 NOTE — Assessment & Plan Note (Signed)
S/p devastating strokes with and without bleeds, with aphasia and bed and chair bound

## 2014-06-25 NOTE — Progress Notes (Signed)
Palliative consult received 6/14 from ED in this hospice patient. At that time recommended hospice facility placement for EOL symptom management and versed infusion. Appears versed infusion has been stopped and he has been started on Phenytoin. He is comfort care-this has already been established. Advised transfer to step-down due to versed infusion for comfort care- would not take this on regular medical floor despite comfort care status. Plans for [facility] in place. PMT will sign off. Re-consult if we can be of assistance-otherwise management per hospice team for comfort.  Anderson Malta, DO Palliative Medicine [phone]

## 2014-06-25 NOTE — Progress Notes (Signed)
EEG Completed; Results Pending  

## 2014-06-25 NOTE — Progress Notes (Signed)
39mL of Versed gtt wasted in the sink. Toniann Fail, RN witnessed.

## 2014-06-25 NOTE — Procedures (Signed)
ELECTROENCEPHALOGRAM REPORT  Patient: [name]       Room #: 8L38 EEG No. ID:  Age: 59 y.o.        Sex: male Referring Physician: Gerarda Fraction Report Date:  06/25/2014        Interpreting Physician: Aline Brochure  History: [name] is an 59 y.o. male with a history of bilateral cerebral infarctions and severe disability as well as seizure disorder and poor compliance with AEDs treatment presenting with encephalopathy and focal status epilepticus.  Indications for study:    Technique: This is an 18 channel routine scalp EEG performed at the bedside with bipolar and monopolar montages arranged in accordance to the international 10/20 system of electrode placement.   Description: Patient was unresponsive and sedated with IV Versed drip. Background activity consisted of diffuse low to moderate amplitude mixed irregular delta and theta activity. Right frontal and temporal sharp wave discharges occurred continuously throughout the EEG recording, as well as fairly frequent occurrences of left frontal and temporal sharp wave activity. Photic stimulation was not performed.   Interpretation: This EEG is abnormal with evidence of moderately severe diffuse encephalopathic process which is nonspecific and can be seen with degenerative as well as metabolic encephalopathies. In addition, there were findings consistent with focal status epilepticus involving the right frontal and temporal regions, as well as frequent epileptiform activity recorded from the left frontal and temporal regions.   Venetia Maxon M.D. [facility] [phone]

## 2014-06-26 LAB — POCT I-STAT, CHEM 8
BUN: 8 mg/dL (ref 6–20)
CALCIUM ION: 0.79 mmol/L — AB (ref 1.12–1.23)
Chloride: 121 mmol/L — ABNORMAL HIGH (ref 101–111)
Creatinine, Ser: 0.6 mg/dL — ABNORMAL LOW (ref 0.61–1.24)
GLUCOSE: 57 mg/dL — AB (ref 65–99)
HCT: 16 % — ABNORMAL LOW (ref 39.0–52.0)
HEMOGLOBIN: 5.4 g/dL — AB (ref 13.0–17.0)
POTASSIUM: 2.8 mmol/L — AB (ref 3.5–5.1)
Sodium: 148 mmol/L — ABNORMAL HIGH (ref 135–145)
TCO2: 16 mmol/L (ref 0–100)

## 2014-06-26 MED ORDER — LORAZEPAM 2 MG/ML IJ SOLN
2.0000 mg | INTRAMUSCULAR | Status: AC | PRN
Start: 2014-06-26 — End: ?

## 2014-06-26 MED ORDER — SODIUM CHLORIDE 0.9 % IJ SOLN
10.0000 mL | INTRAMUSCULAR | Status: DC | PRN
Start: 2014-06-26 — End: 2014-06-26

## 2014-06-26 MED ORDER — SODIUM CHLORIDE 0.9 % IV SOLN: 200.0000 mg | Freq: Two times a day (BID) | INTRAVENOUS | Status: AC

## 2014-06-26 MED ORDER — SODIUM CHLORIDE 0.9 % IV SOLN: 1000.0000 mg | Freq: Two times a day (BID) | INTRAVENOUS | Status: AC

## 2014-06-26 MED ORDER — PHENYTOIN SODIUM 50 MG/ML IJ SOLN: 100.0000 mg | Freq: Three times a day (TID) | INTRAMUSCULAR | Status: AC

## 2014-06-26 MED ORDER — MORPHINE SULFATE 2 MG/ML IJ SOLN: 2.0000 mg | INTRAMUSCULAR | Status: AC | PRN

## 2014-06-26 NOTE — Care Management Note (Signed)
Case Management Note  Patient Details  Name: [name] MRN: [id number] Date of Birth: 1955/07/14  Subjective/Objective:   Pt admitted on 06/24/14 with status epilepticus.  PTA, pt resided at [facility] for respite care.                   Action/Plan: Pt for dc to [facility] today, per CSW arrangements.    Expected Discharge Date:      06/26/14          Expected Discharge Plan:  Hospice Medical Facility  In-House Referral:  Clinical Social Work  Discharge planning Services  CM Consult  Post Acute Care Choice:    Choice offered to:     DME Arranged:    DME Agency:     HH Arranged:    HH Agency:     Status of Service:  Completed, signed off  Medicare Important Message Given:  N/A - LOS <3 / Initial given by admissions Date Medicare IM Given:    Medicare IM give by:    Date Additional Medicare IM Given:    Additional Medicare Important Message give by:     If discussed at Long Length of Stay Meetings, dates discussed:    Additional Comments:  Quintella Baton, RN, BSN  Trauma/Neuro ICU Case Manager [phone]

## 2014-06-26 NOTE — Discharge Planning (Signed)
CSW received handoff regarding patient's disposition to [facility] residential hospice. CSW contacted by [facility] regarding bed availability at [facility] on 6/16. Per [facility], patient's family aware of bed availability. CSW updated RN regarding discharge plan.  CSW contacted [facility] regarding discharge. [facility] expecting patient on 6/16. Discharge summary faxed to facility.  Patient to be discharged to [facility] residential hospice. Patient's family updated at bedside by CSW and Charity fundraiser.  Facility: [facility] RN report number: [phone] Transportation: EMS ([address]  Marcelline Deist, [place] - [phone] Clinical Social Work Department Orthopedics [phone]) and Surgical [phone])

## 2014-06-26 NOTE — Progress Notes (Signed)
[facility] Social Work note LCSW made visit to meet with wife, [name] and son, Hettie Holstein to review [facility] admission and complete Financial Assessment. Family are prepared for admission to facility today. IV team arrived during visit to place picc line. [facility] ready for patient when discharge is complete.  Melanie A. Temple Pacini [phone]

## 2014-06-26 NOTE — Progress Notes (Signed)
Nutrition Brief Note  Chart reviewed. Pt now transitioning to comfort care.  No further nutrition interventions warranted at this time.  Please re-consult as needed.   Reign Bartnick A. Julina Altmann, RD, LDN, CDE Pager: [phone] After hours Pager: [phone]  

## 2014-06-26 NOTE — Discharge Summary (Signed)
Physician Discharge Summary  Patient ID: [name] MRN: [id number] DOB/AGE: 05-26-1955 59 y.o.  Admit date: 06/24/2014 Discharge date: 06/26/2014    Discharge Diagnoses:  Active Problems:   Status epilepticus   DNI (do not intubate)   Hypocalcemia    Brief Summary: [name] is a 59 y.o. 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  In ER he failed initial load of depakote and keppra and continued to seize.  He had no improvement in mental status between seizures. He was started on vimpat and versed infusion and PCCM called to admit to ICU.  Was previously DNR which was confirmed with son and wife.  He was admitted to ICU, followed closely by neurology and continued on vimpat, keppra and depakote as well as midazolam gtt but remained in status epilepticus.  Pt was previous hospice patient at home and hospice continued to follow in the hospital.  Discussions with wife and son regarding goals of care revealed their concern for preserving his dignity and decision was made to transition fully to comfort measures and d/c to residential hospice.   Hospice remains involved and pt is pending tx to [facility].     Consults: Neurology -- Kirpatrick   Lines/tubes: none  Microbiology/Sepsis markers: none  Significant Diagnostic Studies:  CT head 6/14 >>> no acute process. Extensive old infarcts with an old left posterior parietal intracerebral hematoma. Progressive atrophy and secondary ventricular dilatation. EEG 6/15>>>evidence of moderately severe diffuse encephalopathic process which is nonspecific and can be seen with degenerative as well as metabolic encephalopathies. In addition, there were findings consistent with focal status epilepticus involving the  right frontal and temporal regions, as well as frequent epileptiform activity recorded from the left frontal and temporal regions.                                                                  D/c plan by Discharge Diagnosis   Status epilepticus. Chronic debilitation after devestating CVAs Chronic seizure D/O PLAN -  Full comfort measures  PICC placed for IV anti-epileptics and comfort medications as needed  Continue vimpat, keppra, dilantin  PRN ativan, PRN morphine for comfort     Filed Vitals:   06/25/14 1200 06/25/14 1300 06/25/14 1441 06/26/14 0511  BP: 136/68 150/76 152/82 124/64  Pulse: 83 86 96 73  Temp:   101.7 F (38.7 C) 97.7 F (36.5 C)  TempSrc: Oral  Axillary Axillary  Resp: $Remo'25 28 28 17  'YNblI$ Weight:      SpO2: 99% 100% 95% 99%     Discharge Labs  BMET  Recent Labs Lab 06/24/14 1335 06/24/14 1511 06/24/14 2327 06/25/14 0245  NA 142 148* 144 141  K 5.5* 2.8* 3.9 6.0*  CL 108 121* 109 108  CO2 26  --  25 21*  GLUCOSE 104* 57* 109* 87  BUN $Re'14 8 12 13  'Zww$ CREATININE 1.23 0.60* 1.16 1.10  CALCIUM 9.0  --  9.1 9.1  MG 2.0  --   --  1.8  PHOS 4.5  --   --  3.7     CBC   Recent Labs Lab 06/24/14 1335 06/24/14 1511 06/24/14 2327 06/25/14 0245  HGB 13.7 5.4* 13.1 13.4  HCT 41.0 16.0* 41.4 41.4  WBC 6.1  --   --  8.6  PLT 160  --   --  87*   Anti-Coagulation No results for input(s): INR in the last 168 hours.       Discharge Instructions    Continue PICC at discharge    Complete by:  As directed   Flush PICC line per home health policy:  Yes               Medication List    STOP taking these medications        acetaminophen 325 MG tablet  Commonly known as:  TYLENOL     aspirin 81 MG chewable tablet     divalproex 125 MG DR tablet  Commonly known as:  DEPAKOTE     guaifenesin 100 MG/5ML syrup  Commonly known as:  ROBITUSSIN     haloperidol 2 MG tablet  Commonly known as:  HALDOL     levETIRAcetam 500 MG tablet   Commonly known as:  KEPPRA     LORazepam 0.5 MG tablet  Commonly known as:  ATIVAN  Replaced by:  LORazepam 2 MG/ML injection     LORazepam 2 MG/ML concentrated solution  Commonly known as:  ATIVAN     losartan 100 MG tablet  Commonly known as:  COZAAR     metoprolol tartrate 25 MG tablet  Commonly known as:  LOPRESSOR     morphine CONCENTRATE 10 MG/0.5ML Soln concentrated solution  Replaced by:  morphine 2 MG/ML injection     omeprazole 20 MG capsule  Commonly known as:  PRILOSEC     polyvinyl alcohol 1.4 % ophthalmic solution  Commonly known as:  LIQUIFILM TEARS     sennosides-docusate sodium 8.6-50 MG tablet  Commonly known as:  SENOKOT-S     sertraline 50 MG tablet  Commonly known as:  ZOLOFT      TAKE these medications        lacosamide 200 mg in sodium chloride 0.9 % 25 mL  Inject 200 mg into the vein every 12 (twelve) hours.     levETIRAcetam 1,000 mg in sodium chloride 0.9 % 100 mL  Inject 1,000 mg into the vein every 12 (twelve) hours.     LORazepam 2 MG/ML injection  Commonly known as:  ATIVAN  Inject 1 mL (2 mg total) into the vein as needed for seizure.     morphine 2 MG/ML injection  Inject 1-2 mLs (2-4 mg total) into the vein every 3 (three) hours as needed.     phenytoin 50 MG/ML injection  Commonly known as:  DILANTIN  Inject 2 mLs (100 mg total) into the vein every 8 (eight) hours.          Disposition: 50-Hospice/Home  Discharged Condition: [name] has met maximum benefit of inpatient care and is medically stable and cleared for discharge.  Patient is pending follow up as above.      Time spent on disposition:  Greater than 35 minutes.   SignedDarlina Sicilian, NP 06/26/2014  11:07 AM Pager: [phone] or [phone]  *Care during the described time interval was provided by me and/or other providers on the critical care team. I have reviewed this patient's available data, including medical history, events of note,  physical examination and test results as part  of my evaluation.    Attending:  complains of  Roselie Awkward, MD [place] [facility] Pager: [phone] Cell: [phone] After 3pm or if no response, call [phone]

## 2014-07-11 DEATH — deceased

## 2014-08-05 IMAGING — CT CT HEAD W/O CM
1 series · 16 of 30 positions shown, 20 images · non-contrast
Comparison: CT 06/22/2012

CLINICAL DATA: Mental status change.  Stroke

CT HEAD WITHOUT CONTRAST
TECHNIQUE: Contiguous axial images were obtained from the base of
the skull through the vertex without contrast.

[Series 3: — · axial · 0.49mm/px · z∈[-382,-242]mm · 16 of 32 slices shown, 20 images]
[im 2/32  brain]
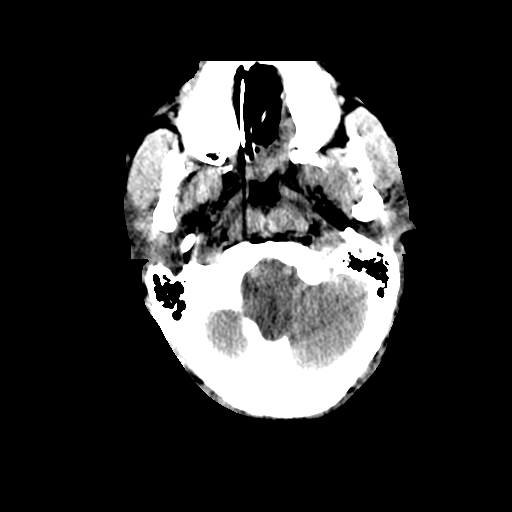
[im 2/32  bone]
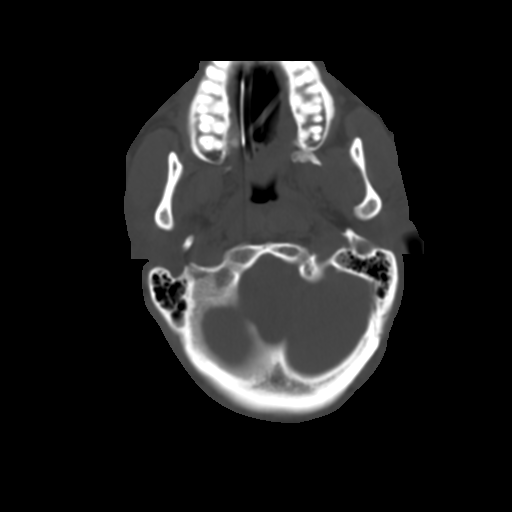
[im 4/32  brain]
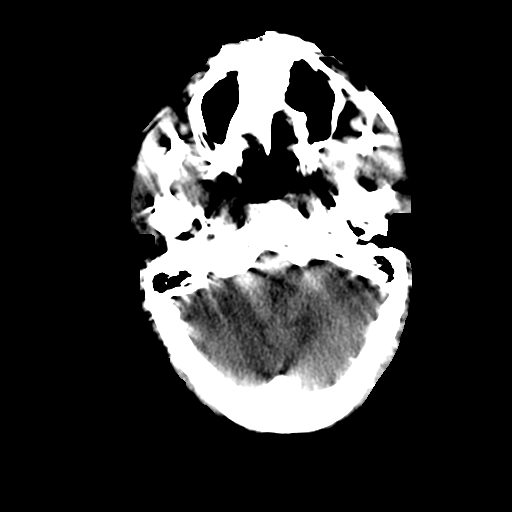
[im 6/32  brain]
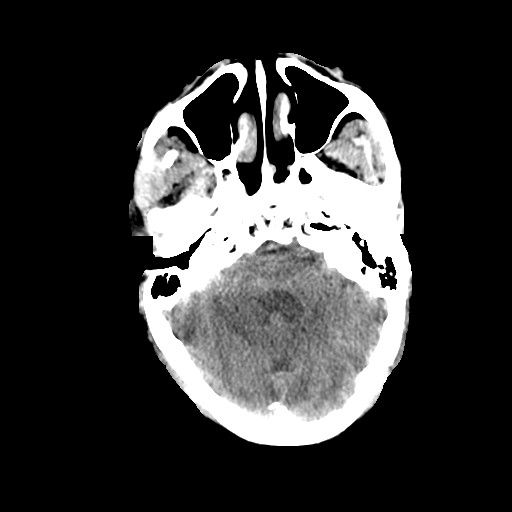
[im 8/32  brain]
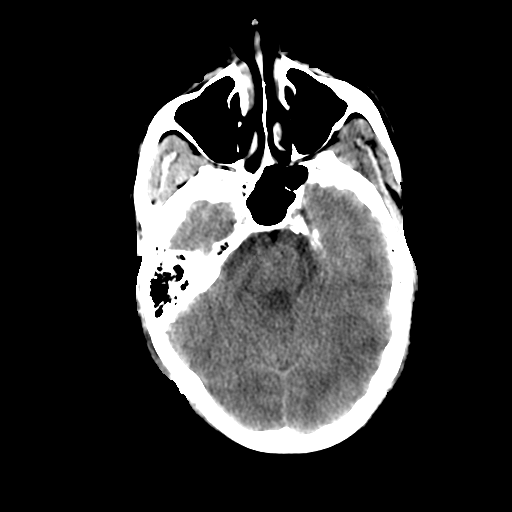
[im 9/32  brain]
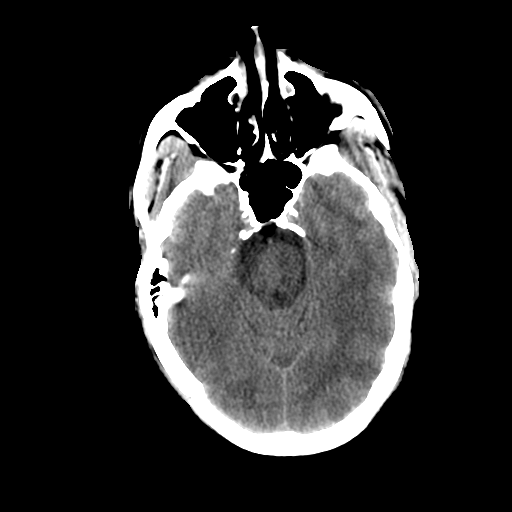
[im 9/32  bone]
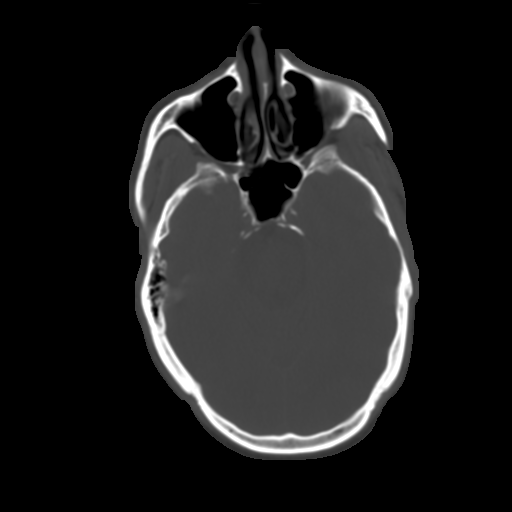
[im 11/32  brain]
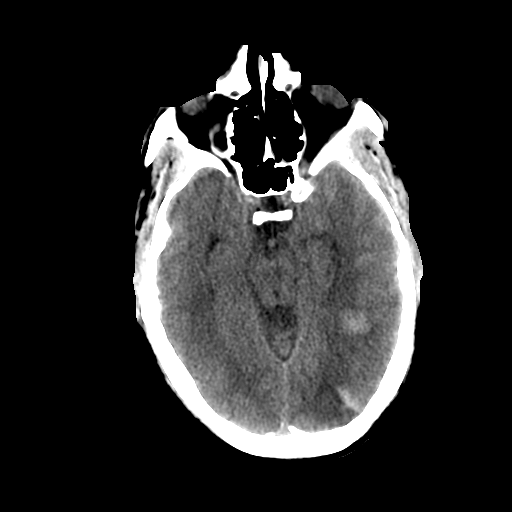
[im 13/32  brain]
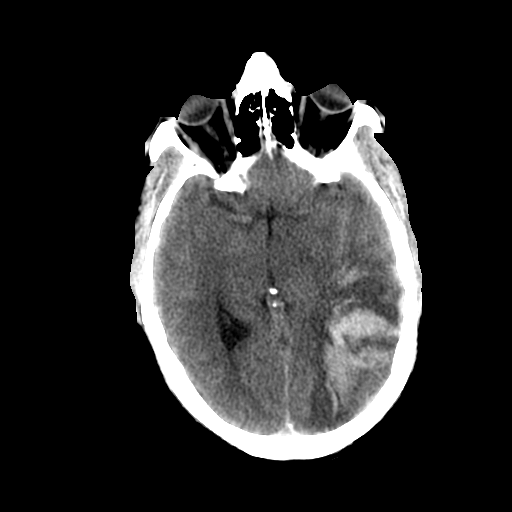
[im 15/32  brain]
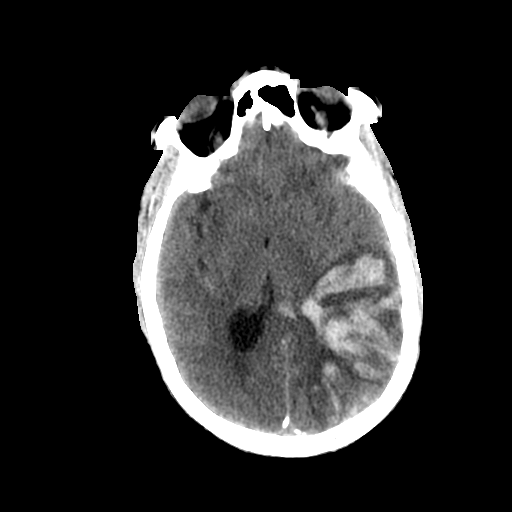
[im 17/32  brain]
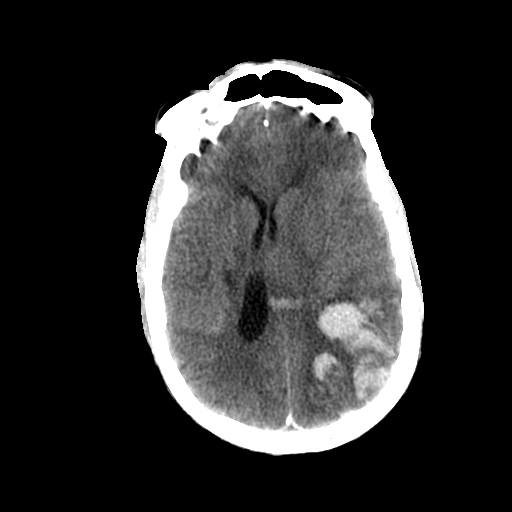
[im 17/32  bone]
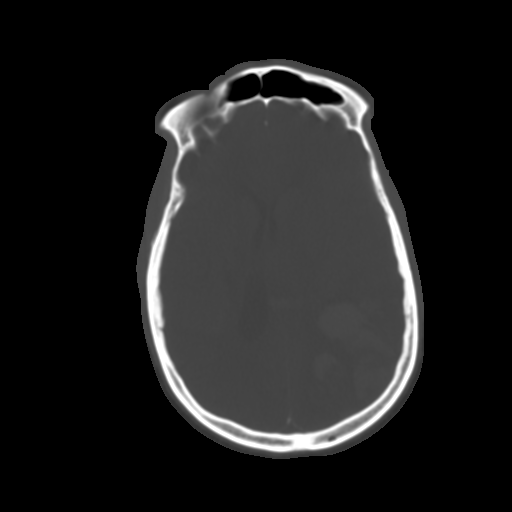
[im 19/32  brain]
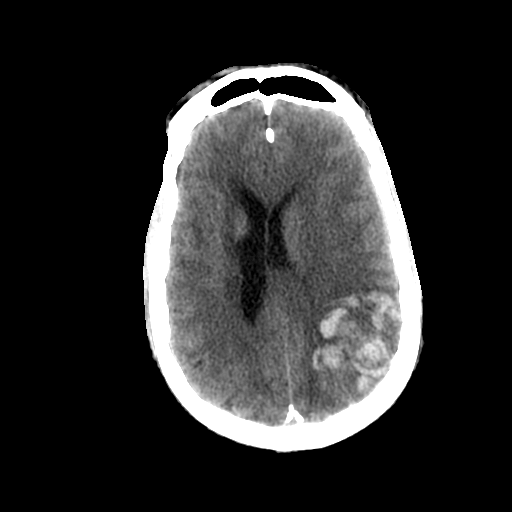
[im 21/32  brain]
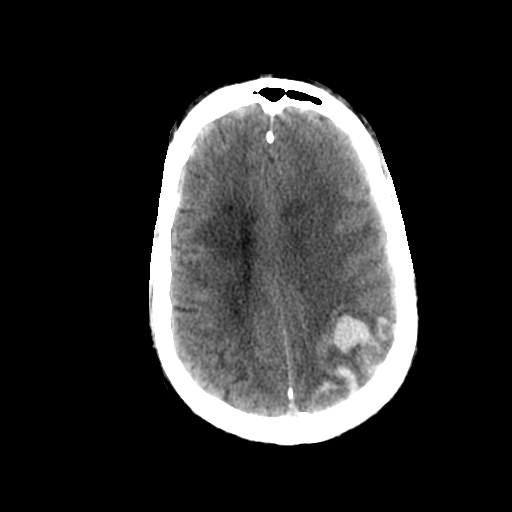
[im 23/32  brain]
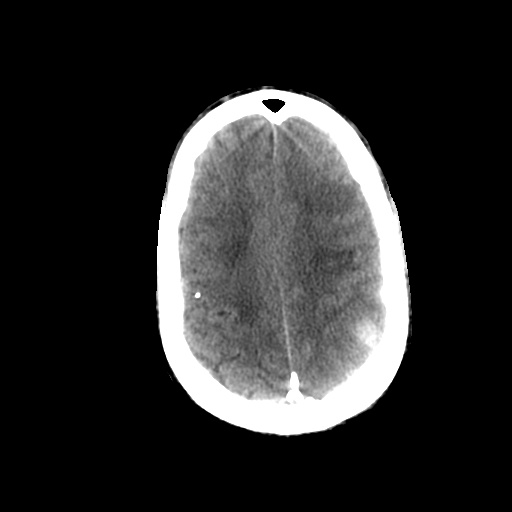
[im 24/32  brain]
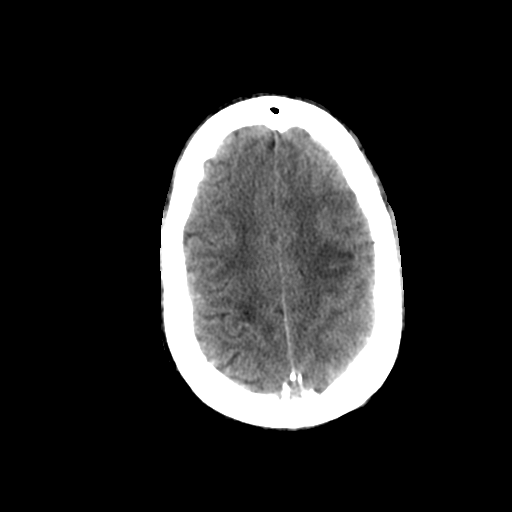
[im 24/32  bone]
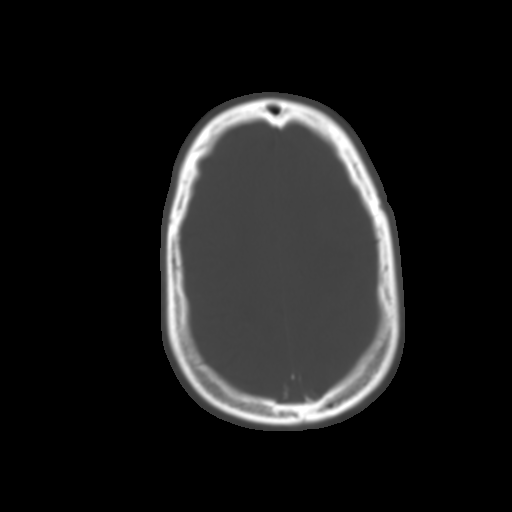
[im 26/32  brain]
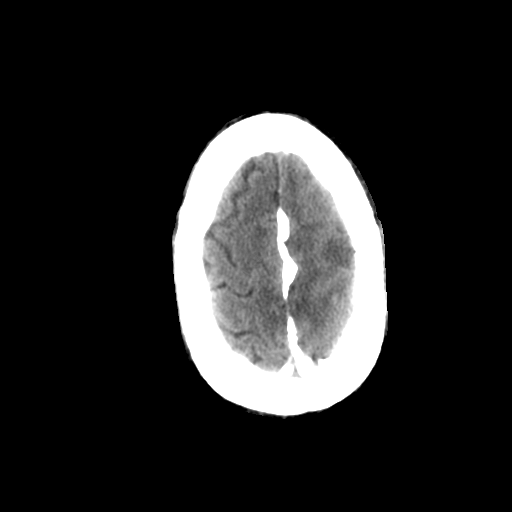
[im 28/32  brain]
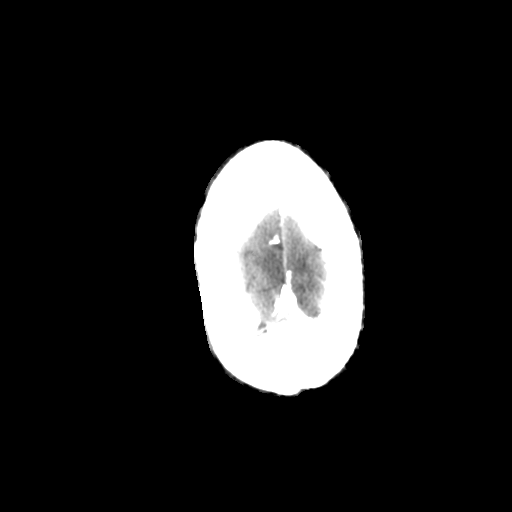
[im 30/32  brain]
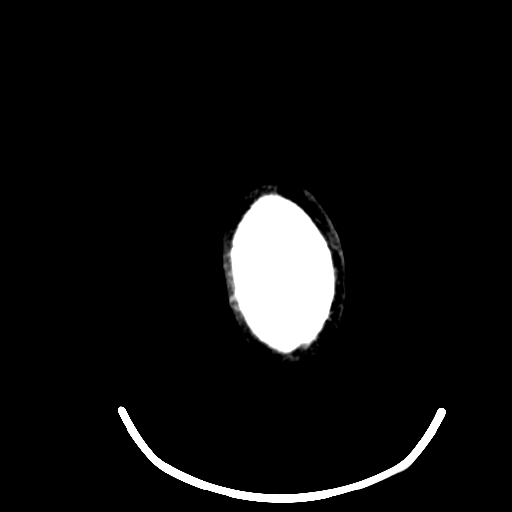

[16 of 30 positions shown; findings below may reference images not displayed]

FINDINGS: Left parietal hemorrhage is similar to the prior study
measuring 6 x 7 cm.  This is consistent with hemorrhagic
infarction. Hemorrhage in the left body of the corpus callosum /
caudate has improved in the interval.  Mass effect on the left
lateral ventricle has improved.  Dilatation of the right lateral
ventricle also has improved.  There is mass effect and 8 mm midline
shift to the right.  There is mass effect on the left lateral
ventricle.  No hydrocephalus.

Chronic ischemic changes in the deep white matter and basal ganglia
on the right are unchanged.
IMPRESSION: Large area of hemorrhagic infarction left parietal lobe is similar.
There is improvement in blood in the corpus collosum/ caudate on
the left.

8 mm midline shift to the right without hydrocephalus.

## 2014-08-05 IMAGING — DX DG ABD PORTABLE 1V
1 series · 1 of 1 positions shown · non-contrast
Comparison: Portable exam 5661 hours compared to 03/17/2006

CLINICAL DATA: Orogastric tube placement

PORTABLE ABDOMEN - 1 VIEW

[ap portable]
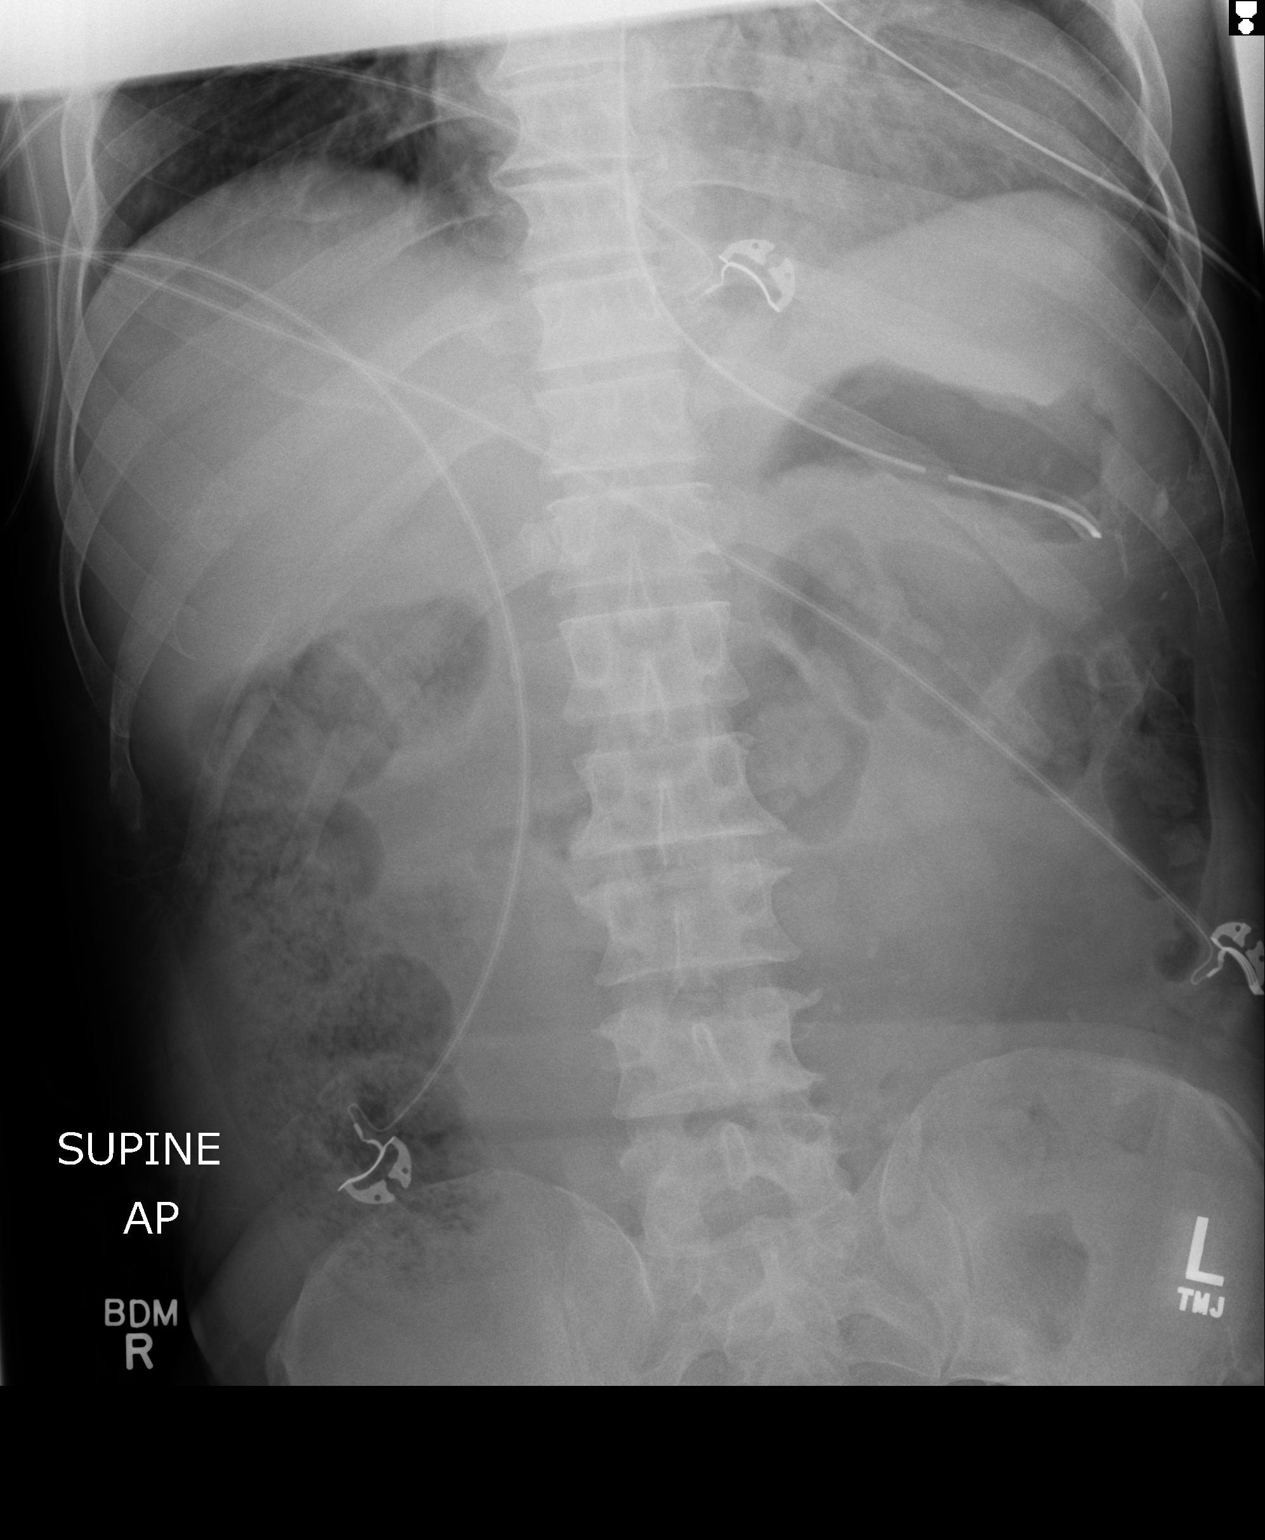

[1 of 1 positions shown; findings below may reference images not displayed]

FINDINGS: Tip of nasogastric tube projects over the proximal to mid stomach.
Nonobstructive bowel gas pattern.
Scattered stool in proximal half of colon.
No acute osseous findings.
Cardiac silhouette appears enlarged.
Question 5 mm left renal calculus.
IMPRESSION: Tip of nasogastric tube projects over the proximal to mid stomach.

## 2014-08-07 IMAGING — CR DG CHEST 1V PORT
1 series · 1 of 1 positions shown · non-contrast
Comparison: 06/23/2012

CLINICAL DATA: Acute respiratory failure on ventilator.  Acute
myocardial infarct.  Nontraumatic intracranial hemorrhage.

PORTABLE CHEST - 1 VIEW

[AP]
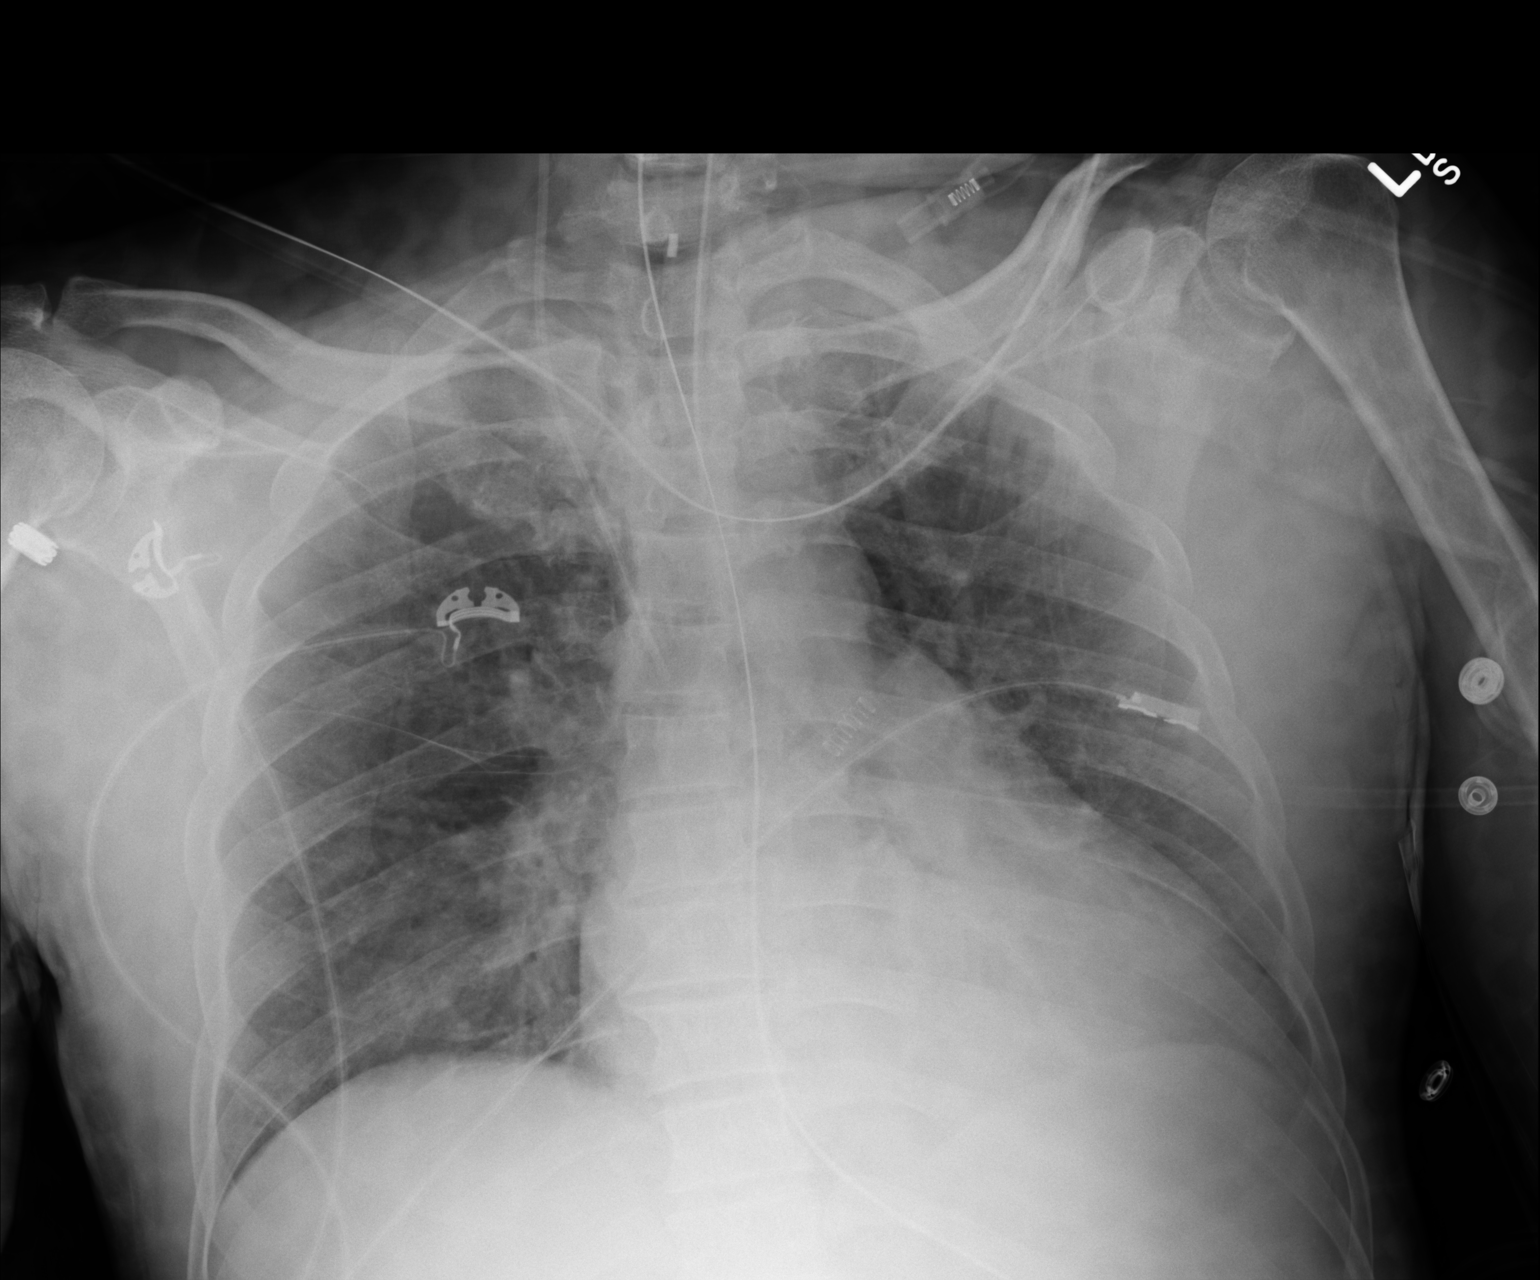

[1 of 1 positions shown; findings below may reference images not displayed]

FINDINGS: Support lines and tubes remain in appropriate position.
Both lung fields grossly clear.  No evidence of pleural effusion or
pneumothorax.  Cardiomegaly stable.
IMPRESSION: Stable cardiomegaly.  No acute findings.

## 2014-08-07 IMAGING — CT CT HEAD W/O CM
1 of 2 series · 13 of 30 positions shown, 17 images · non-contrast
Comparison: 06/23/2012.

CLINICAL DATA: Infarct post t-PA.  Follow-up.

CT HEAD WITHOUT CONTRAST
TECHNIQUE: Contiguous axial images were obtained from the base of
the skull through the vertex without contrast.

[Series 2: brain · axial · 0.47mm/px · z∈[+150,+292]mm · 13 of 36 slices shown, 17 images]
[im 3/36  brain]
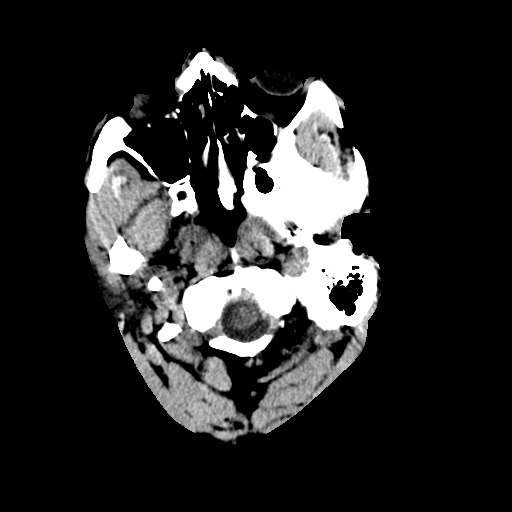
[im 3/36  bone]
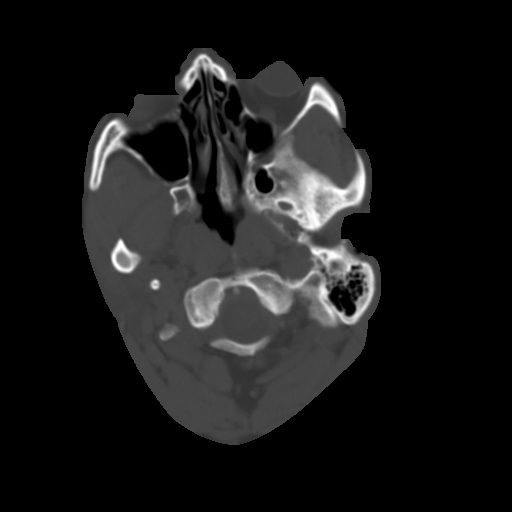
[im 6/36  brain]
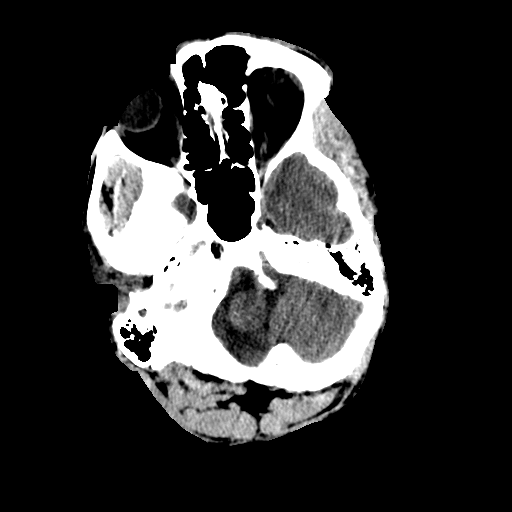
[im 8/36  brain]
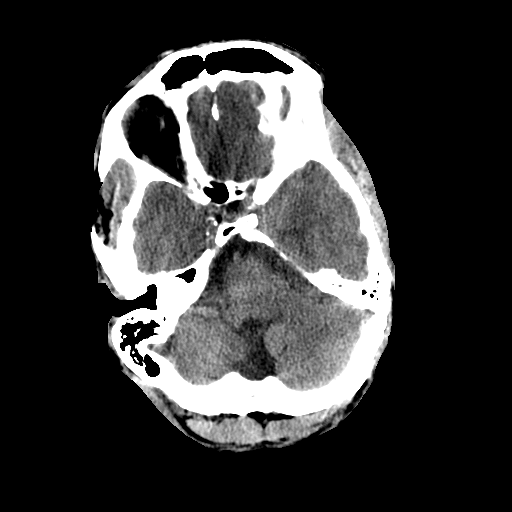
[im 11/36  brain]
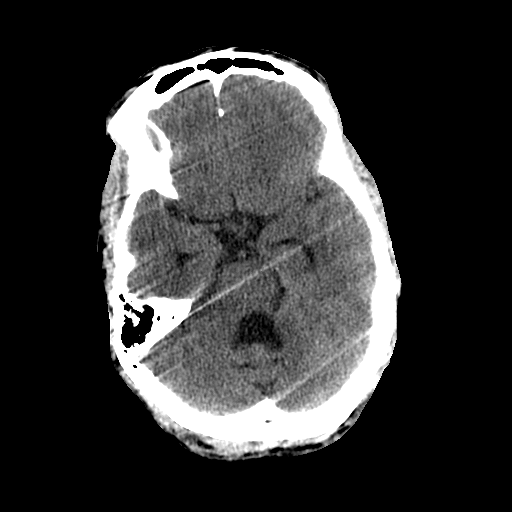
[im 13/36  brain]
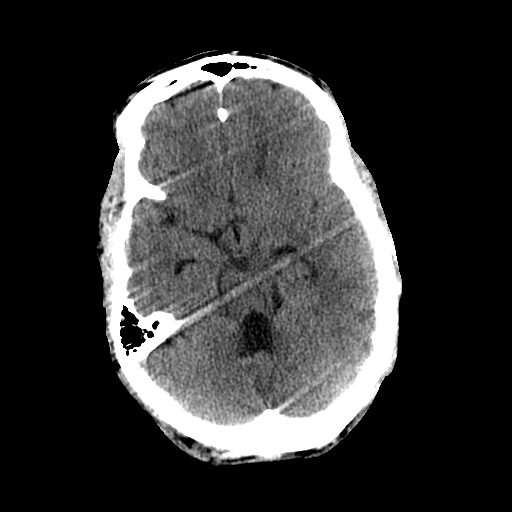
[im 13/36  bone]
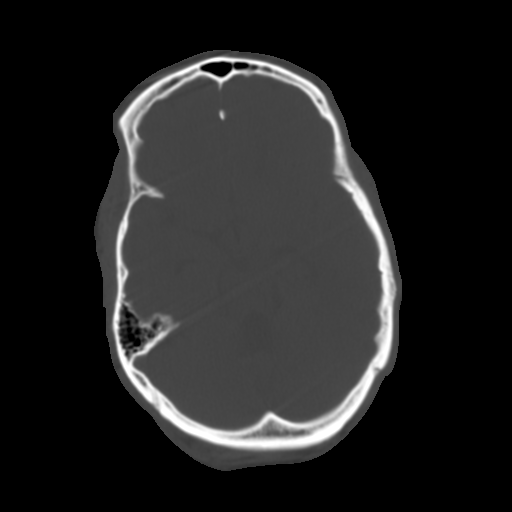
[im 16/36  brain]
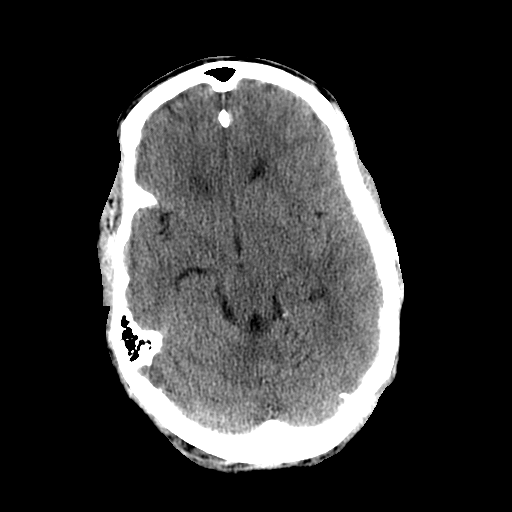
[im 18/36  brain]
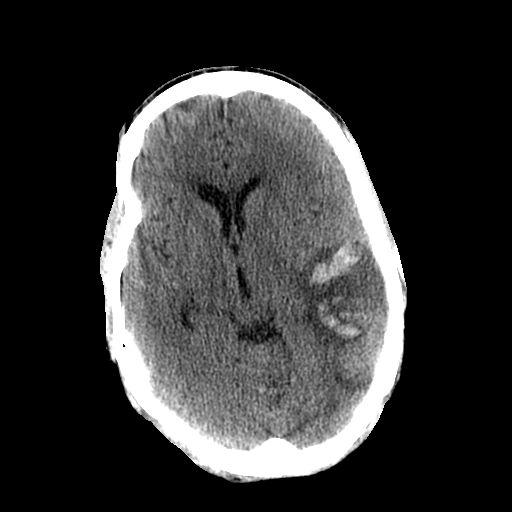
[im 21/36  brain]
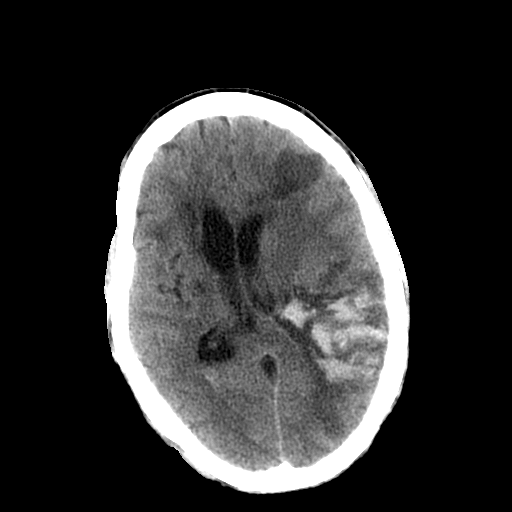
[im 23/36  brain]
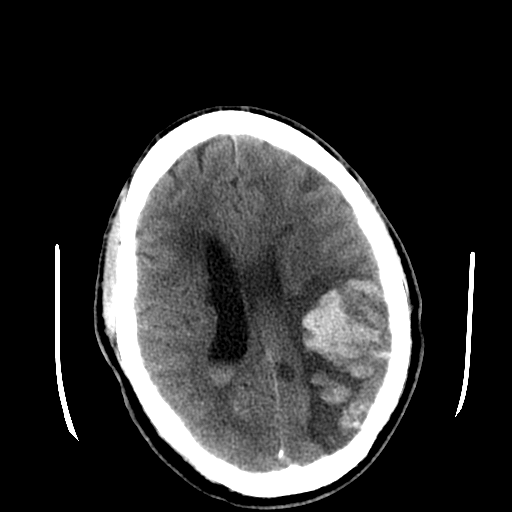
[im 23/36  bone]
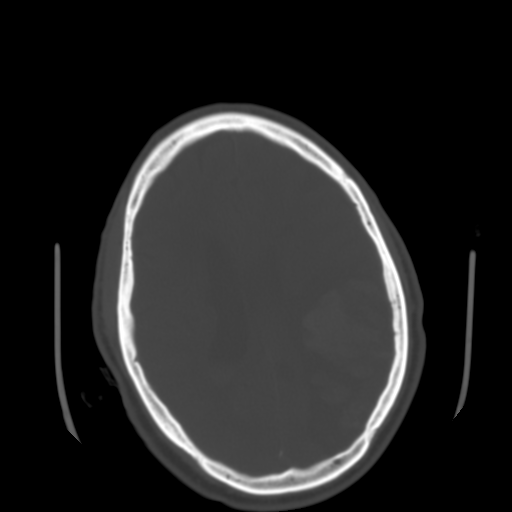
[im 26/36  brain]
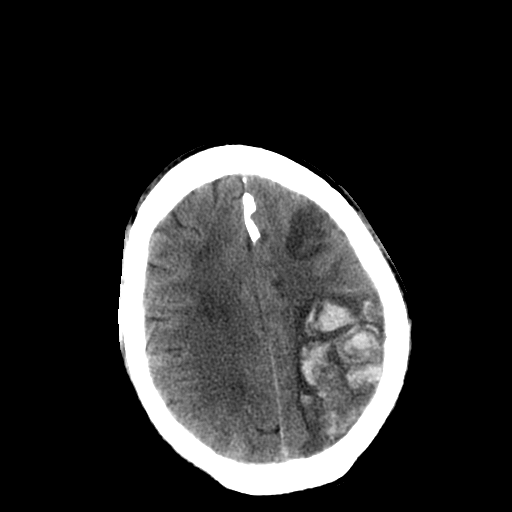
[im 28/36  brain]
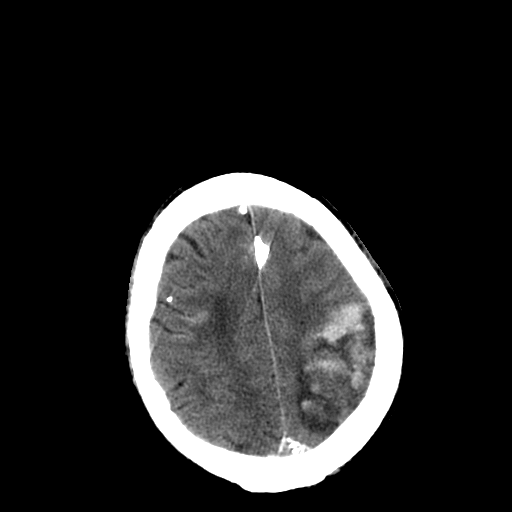
[im 31/36  brain]
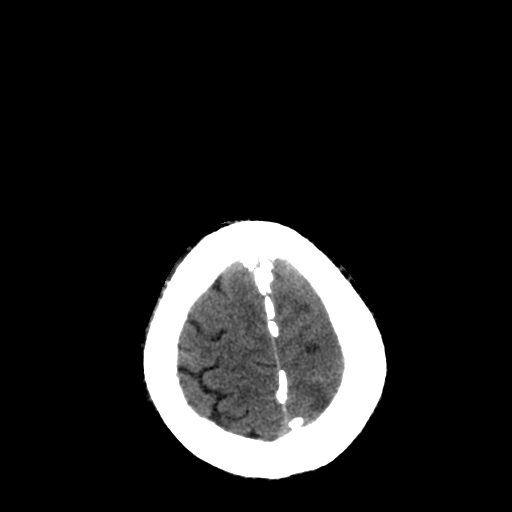
[im 33/36  brain]
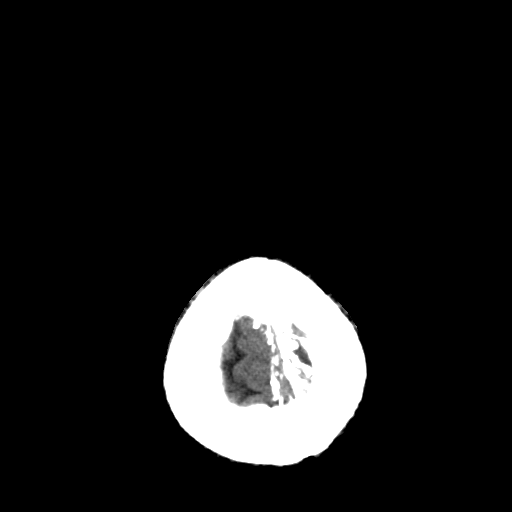
[im 33/36  bone]
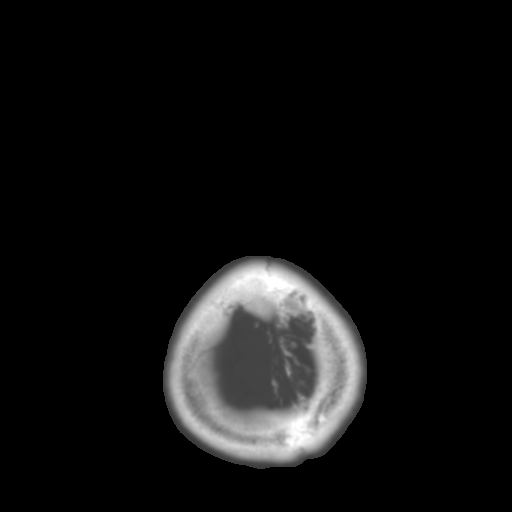

[13 of 30 positions shown; findings below may reference images not displayed]

FINDINGS: Large hemorrhagic left parietal - frontal - posterior
left temporal lobe infarct with relatively similar size to the
prior examination.  Mass effect upon the left lateral ventricle
with 7.6 mm of midline shift to the right without significant
change.

Trapping of the right lateral ventricle.  Within the dilated right
lateral ventricle, now noted is a small amount of blood.
Subarachnoid blood seen along the convexity greater on the right
(more apparent than on the prior exam).

Bilateral anterior frontal lobe infarcts greater on the left with
minimal associated petechial hemorrhage.
IMPRESSION: Large left parietal - frontal - posterior left temporal lobe
hemorrhagic infarct and mass effect upon the left lateral ventricle
relatively similar to the recent exam.

Interval development of interventricular blood within the tract
right lateral ventricle.

Subarachnoid blood most notable right convexity and right sylvian
fissure more apparent than on the prior exam.

Bilateral anterior frontal lobe infarcts greater on the left with
minimal associated petechial hemorrhage.

Critical Value/emergent results were called by telephone at the
time of interpretation on 06/24/2012 at [DATE] a.m. to Romo the
patient's nurse , who verbally acknowledged these results.
